# Patient Record
Sex: Female | Born: 1935 | Race: Black or African American | Hispanic: No | Marital: Married | Smoking: Former smoker
Health system: Southern US, Community
[De-identification: ages and names within clinical notes are randomized; demographics above are authoritative.]

## PROBLEM LIST (undated history)

## (undated) DIAGNOSIS — Z8719 Personal history of other diseases of the digestive system: Secondary | ICD-10-CM

## (undated) DIAGNOSIS — G4733 Obstructive sleep apnea (adult) (pediatric): Secondary | ICD-10-CM

## (undated) DIAGNOSIS — E1165 Type 2 diabetes mellitus with hyperglycemia: Secondary | ICD-10-CM

## (undated) DIAGNOSIS — I639 Cerebral infarction, unspecified: Secondary | ICD-10-CM

## (undated) DIAGNOSIS — K219 Gastro-esophageal reflux disease without esophagitis: Secondary | ICD-10-CM

## (undated) DIAGNOSIS — C801 Malignant (primary) neoplasm, unspecified: Secondary | ICD-10-CM

## (undated) DIAGNOSIS — K226 Gastro-esophageal laceration-hemorrhage syndrome: Secondary | ICD-10-CM

## (undated) DIAGNOSIS — I10 Essential (primary) hypertension: Secondary | ICD-10-CM

## (undated) DIAGNOSIS — E559 Vitamin D deficiency, unspecified: Secondary | ICD-10-CM

## (undated) DIAGNOSIS — Z9989 Dependence on other enabling machines and devices: Secondary | ICD-10-CM

## (undated) DIAGNOSIS — R400 Somnolence: Secondary | ICD-10-CM

## (undated) DIAGNOSIS — IMO0002 Reserved for concepts with insufficient information to code with codable children: Secondary | ICD-10-CM

## (undated) DIAGNOSIS — G4700 Insomnia, unspecified: Secondary | ICD-10-CM

## (undated) DIAGNOSIS — E669 Obesity, unspecified: Secondary | ICD-10-CM

## (undated) DIAGNOSIS — E785 Hyperlipidemia, unspecified: Secondary | ICD-10-CM

## (undated) DIAGNOSIS — E118 Type 2 diabetes mellitus with unspecified complications: Secondary | ICD-10-CM

## (undated) DIAGNOSIS — I739 Peripheral vascular disease, unspecified: Secondary | ICD-10-CM

## (undated) DIAGNOSIS — Z9289 Personal history of other medical treatment: Secondary | ICD-10-CM

## (undated) DIAGNOSIS — I2510 Atherosclerotic heart disease of native coronary artery without angina pectoris: Principal | ICD-10-CM

## (undated) DIAGNOSIS — K5900 Constipation, unspecified: Secondary | ICD-10-CM

## (undated) DIAGNOSIS — G8929 Other chronic pain: Secondary | ICD-10-CM

## (undated) DIAGNOSIS — I509 Heart failure, unspecified: Secondary | ICD-10-CM

## (undated) DIAGNOSIS — E66811 Obesity, class 1: Secondary | ICD-10-CM

## (undated) HISTORY — DX: Malignant (primary) neoplasm, unspecified: C80.1

## (undated) HISTORY — DX: Type 2 diabetes mellitus with unspecified complications: E11.8

## (undated) HISTORY — DX: Peripheral vascular disease, unspecified: I73.9

## (undated) HISTORY — DX: Reserved for concepts with insufficient information to code with codable children: IMO0002

## (undated) HISTORY — DX: Hyperlipidemia, unspecified: E78.5

## (undated) HISTORY — DX: Type 2 diabetes mellitus with hyperglycemia: E11.65

## (undated) HISTORY — DX: Cerebral infarction, unspecified: I63.9

## (undated) HISTORY — PX: HYSTEROSCOPY: SHX211

## (undated) HISTORY — DX: Heart failure, unspecified: I50.9

## (undated) HISTORY — DX: Atherosclerotic heart disease of native coronary artery without angina pectoris: I25.10

## (undated) HISTORY — DX: Personal history of other medical treatment: Z92.89

## (undated) HISTORY — DX: Dependence on other enabling machines and devices: Z99.89

## (undated) HISTORY — DX: Somnolence: R40.0

## (undated) HISTORY — PX: CHOLECYSTECTOMY: SHX55

## (undated) HISTORY — DX: Obstructive sleep apnea (adult) (pediatric): G47.33

## (undated) HISTORY — DX: Personal history of other diseases of the digestive system: Z87.19

## (undated) HISTORY — DX: Obesity, unspecified: E66.9

## (undated) HISTORY — DX: Gastro-esophageal laceration-hemorrhage syndrome: K22.6

## (undated) HISTORY — DX: Essential (primary) hypertension: I10

## (undated) HISTORY — PX: EYE SURGERY: SHX253

## (undated) HISTORY — DX: Obesity, class 1: E66.811

---

## 1997-05-20 ENCOUNTER — Other Ambulatory Visit: Admission: RE | Admit: 1997-05-20 | Discharge: 1997-05-20 | Payer: Self-pay | Admitting: Obstetrics and Gynecology

## 1998-05-27 ENCOUNTER — Other Ambulatory Visit: Admission: RE | Admit: 1998-05-27 | Discharge: 1998-05-27 | Payer: Self-pay | Admitting: Obstetrics and Gynecology

## 1999-03-10 ENCOUNTER — Other Ambulatory Visit: Admission: RE | Admit: 1999-03-10 | Discharge: 1999-03-10 | Payer: Self-pay | Admitting: Obstetrics and Gynecology

## 2000-03-15 ENCOUNTER — Other Ambulatory Visit: Admission: RE | Admit: 2000-03-15 | Discharge: 2000-03-15 | Payer: Self-pay | Admitting: Obstetrics and Gynecology

## 2001-04-10 ENCOUNTER — Other Ambulatory Visit: Admission: RE | Admit: 2001-04-10 | Discharge: 2001-04-10 | Payer: Self-pay | Admitting: Obstetrics & Gynecology

## 2002-04-15 ENCOUNTER — Other Ambulatory Visit: Admission: RE | Admit: 2002-04-15 | Discharge: 2002-04-15 | Payer: Self-pay | Admitting: Obstetrics and Gynecology

## 2003-08-12 ENCOUNTER — Other Ambulatory Visit: Admission: RE | Admit: 2003-08-12 | Discharge: 2003-08-12 | Payer: Self-pay | Admitting: Obstetrics and Gynecology

## 2006-02-06 DIAGNOSIS — I2510 Atherosclerotic heart disease of native coronary artery without angina pectoris: Secondary | ICD-10-CM

## 2006-02-06 HISTORY — DX: Atherosclerotic heart disease of native coronary artery without angina pectoris: I25.10

## 2006-11-13 ENCOUNTER — Ambulatory Visit: Payer: Self-pay | Admitting: Surgery

## 2006-12-18 ENCOUNTER — Ambulatory Visit: Payer: Self-pay | Admitting: Surgery

## 2006-12-27 ENCOUNTER — Ambulatory Visit: Payer: Self-pay | Admitting: Cardiothoracic Surgery

## 2006-12-27 ENCOUNTER — Encounter: Payer: Self-pay | Admitting: Surgery

## 2006-12-27 ENCOUNTER — Ambulatory Visit (HOSPITAL_COMMUNITY): Admission: RE | Admit: 2006-12-27 | Discharge: 2006-12-27 | Payer: Self-pay | Admitting: Surgery

## 2006-12-31 ENCOUNTER — Inpatient Hospital Stay (HOSPITAL_COMMUNITY): Admission: RE | Admit: 2006-12-31 | Discharge: 2007-01-05 | Payer: Self-pay | Admitting: Surgery

## 2006-12-31 ENCOUNTER — Ambulatory Visit: Payer: Self-pay | Admitting: Surgery

## 2006-12-31 HISTORY — PX: CORONARY ARTERY BYPASS GRAFT: SHX141

## 2007-01-25 ENCOUNTER — Ambulatory Visit: Payer: Self-pay | Admitting: Cardiothoracic Surgery

## 2007-01-25 ENCOUNTER — Encounter: Admission: RE | Admit: 2007-01-25 | Discharge: 2007-01-25 | Payer: Self-pay | Admitting: Cardiothoracic Surgery

## 2009-04-21 ENCOUNTER — Encounter: Admission: RE | Admit: 2009-04-21 | Discharge: 2009-04-21 | Payer: Self-pay | Admitting: Obstetrics and Gynecology

## 2009-05-30 IMAGING — CR DG CHEST 2V
2 series · 2 of 2 positions shown · non-contrast
Comparison: none

HISTORY: Status post CABG

[w chest pa]
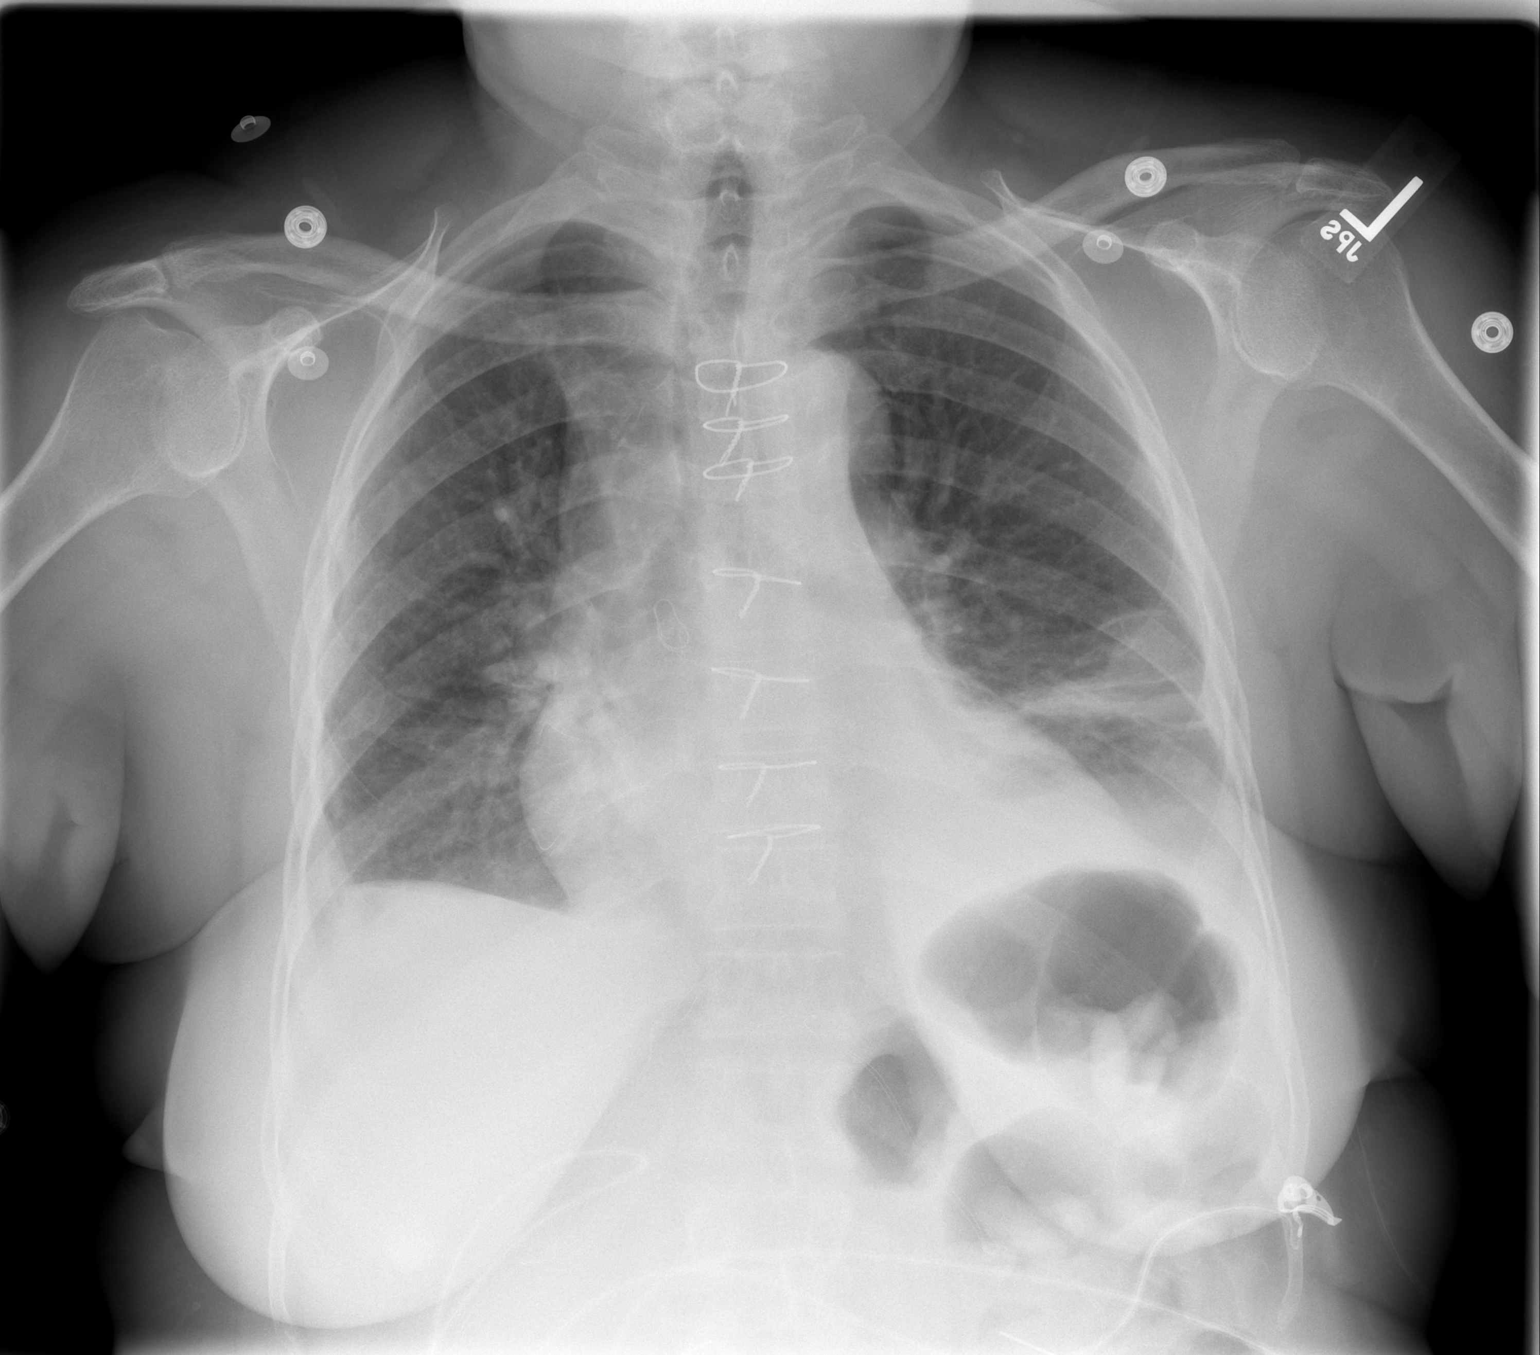

[w chest lat]
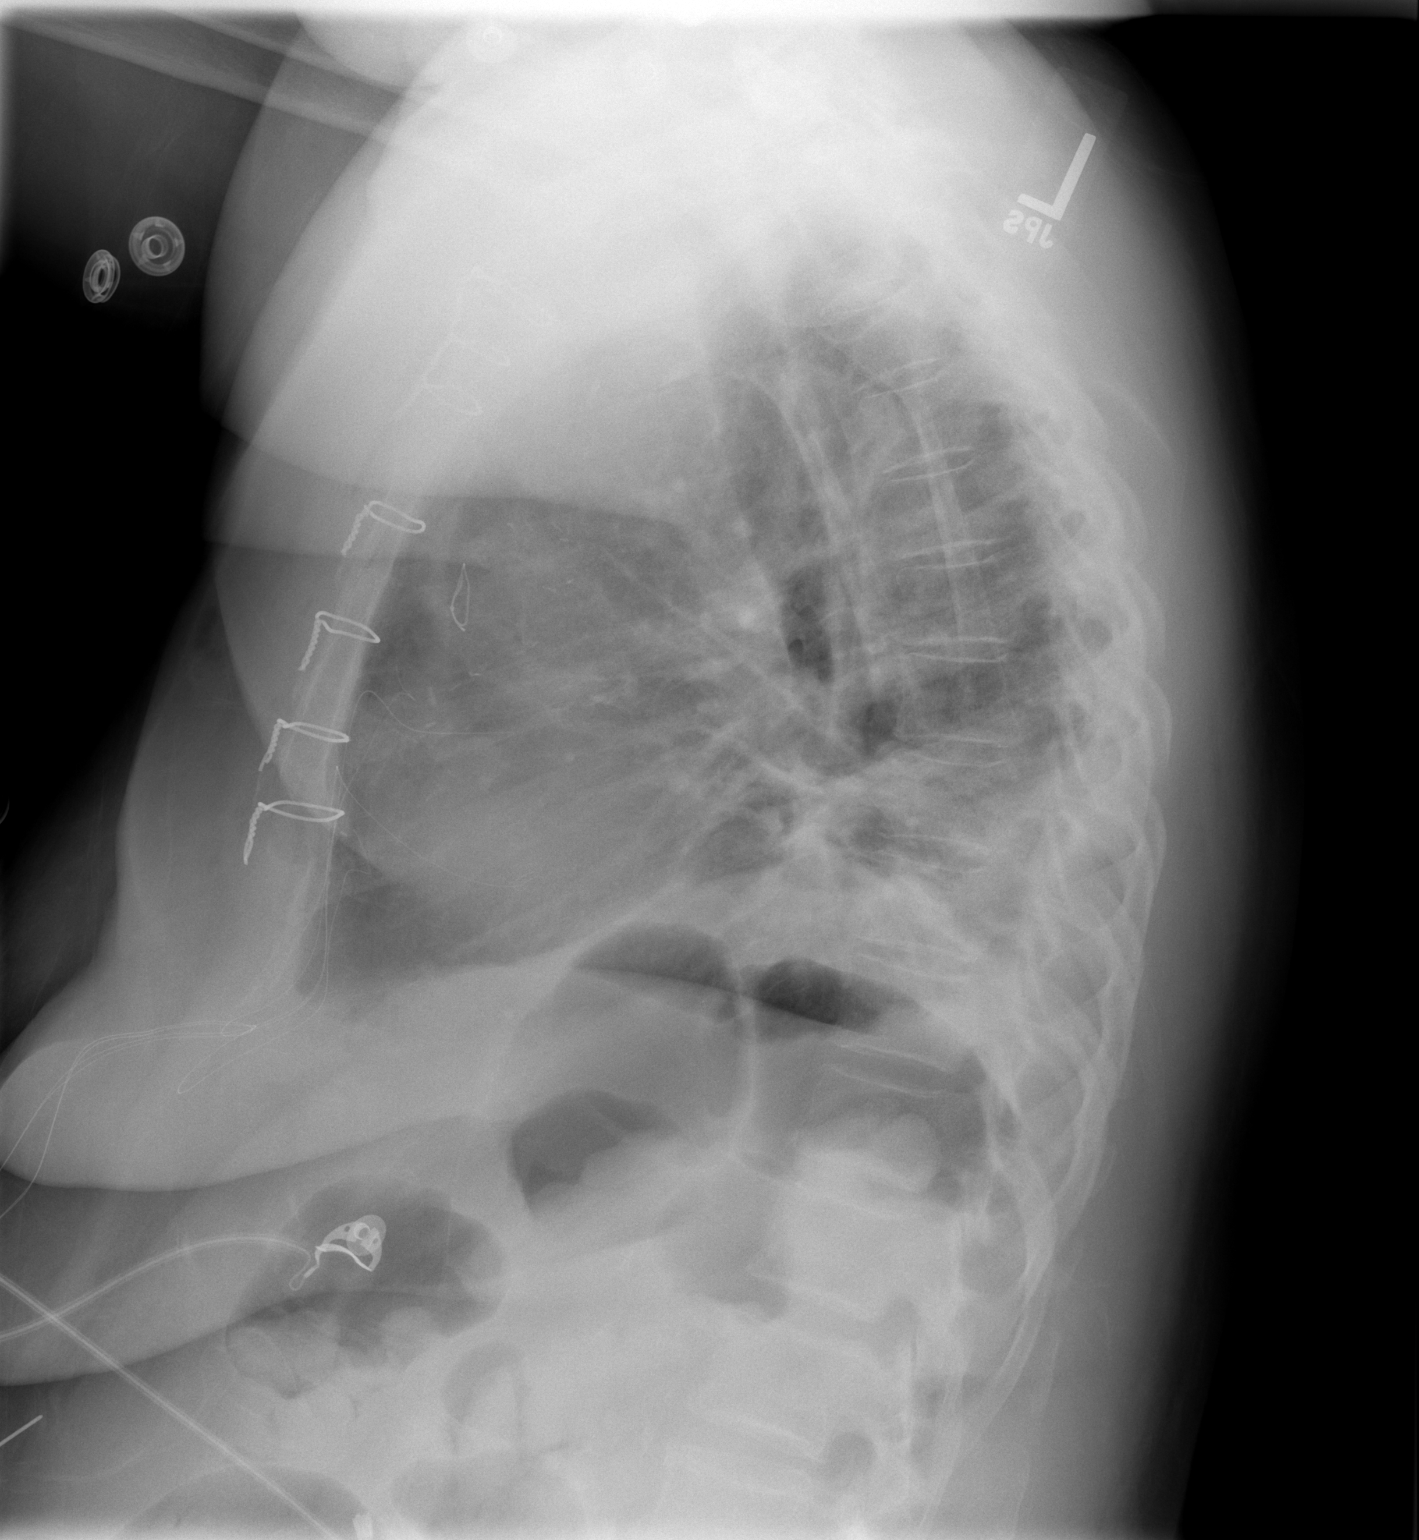

[2 of 2 positions shown; findings below may reference images not displayed]

CHEST 2 VIEWS:

Comparison 01/01/2007

Interval removal of mediastinal drain, left thoracostomy tube, and Swan-Ganz
catheter.
Cardiac enlargement status post CABG.
Slight pulmonary vascular congestion.
Small bibasilar effusions and left basilar atelectasis.
Tiny left apex pneumothorax.
Improved aeration right lower lobe.
IMPRESSION: Left basilar atelectasis and small pleural effusion with tiny right pleural
effusion.
Tiny left apex pneumothorax status post chest tube removal.
Cardiac enlargement with pulmonary vascular congestion.

## 2010-06-21 NOTE — Assessment & Plan Note (Signed)
OFFICE VISIT   [name] [name], [name] [name]  DOB:  1931-08-19                                        December 27, 2006  CHART #:  [id number]   The patient is scheduled for coronary artery bypass grafting by Dr.  Lavinia Sharps on Monday, November 24.  The patient came in today to the patient  area to have blood drawn and noted that she had had some coughing and  that had been started about 5 days ago on amoxicillin so she was asked  to come to the office to be evaluated preoperatively.  She notes that  since starting on amoxicillin she has had marked improvement in the  cough that she had last week and is now nonproductive cough.  She is not  short of breath.  Denies any known fever or chills at home.   PHYSICAL EXAMINATION:  GENERAL:  On exam, the patient is comfortable  without coughing during exam.  VITALS:  Blood pressure is 145/76, pulse is 82, respiratory rate is 18,  O2 sat is 96% on room air.  Temp is 97.6.  LUNGS:  Clear bilaterally without wheezing or rhonchi.  HEENT:  She has no nasal drainage.  She has no pedal edema.   Follow-up chest x-ray shows a normal chest x-ray without evidence of  effusions or infiltrates.  The patient's laboratory findings have a  slight elevation of a white of 13,000.  Hematocrit is 37.3, hemoglobin  12.3, potassium is 3.8, glucose is elevated at 173, creatinine is 0.9.  After examining the patient and her chest x-ray, it seems reasonable to  proceed with her planned bypass surgery next week.  She will contact us  immediately should she have any fever, chills.  She is on Pletal  which I have asked her to stop today.  This office note is dictated by  Dr. Tyrone Sage on [name] [name].   [name] Plane, MD  Electronically Signed   EG/MEDQ  D:  12/27/2006  T:  12/28/2006  Job:  [id number]   cc:   [name] [name], M.D.

## 2010-06-21 NOTE — Discharge Summary (Signed)
NAME:  [name] [name], FIX NO.:  [id number]   MEDICAL RECORD NO.:  [id number]          PATIENT TYPE:  INP   LOCATION:  2021                         FACILITY:  [facility]   PHYSICIAN:  Theda Belfast, PA DATE OF BIRTH:  1931-05-07   DATE OF ADMISSION:  12/31/2006  DATE OF DISCHARGE:                               DISCHARGE SUMMARY   FINAL DIAGNOSIS:  1. Left main severe three-vessel coronary artery disease.   IN-HOSPITAL DIAGNOSES:  1. Acute blood loss anemia postoperatively.  2. Volume overload postoperatively.  3. Urinary tract infection postoperatively.   SECONDARY DIAGNOSES:  1. Diabetes mellitus.  2. Hypertension.  3. Hyperlipidemia.  4. Status post cholecystectomy.  5. Gastroesophageal reflux disease.   IN-HOSPITAL OPERATIONS AND PROCEDURES:  1. Coronary artery bypass grafting times 4 using a left internal      mammary artery graft to left anterior descending coronary,      sequential saphenous vein graft to diagonal branch of left anterior      descending artery, obtuse marginal branch of the left circumflex,      and the posterior lateral branch of the right coronary artery.      Endoscopic vein harvesting from left leg done.   PATIENT'S HISTORY AND PHYSICAL AND HOSPITAL COURSE:  The patient is a 77-  year-old female with history of diabetes mellitus who was referred by  Dr. Allyson Sabal.  She had no prior cardiac history but was evaluated in July  2008 for lower extremity claudication symptoms.  Given her risk factors  for heart disease and some vague heart burn type symptoms she underwent  a presenting Myoview test which showed inferior lateral ischemia.  She  subsequently underwent cardiac catheterization on October 23, 2006  which showed 60% distal left main stenosis and three-vessel coronary  artery disease.  Following catheterization Dr. Laneta Simmers was consulted.  He  saw and evaluated the patient.  He discussed with the patient undergoing  coronary  artery bypass grafting.  Risks and benefits were discussed with  the patient.  The patient acknowledged her understanding and agreed to  proceed.  Surgery was scheduled for December 31, 2006.  For details of  the patient's past medical history and physical exam please see dictated  H&P.   The patient was taken to the Operating Room December 31, 2006 where she  underwent coronary artery bypass grafting times 4 using left internal  mammary artery to left anterior descending coronary artery, sequential  saphenous vein graft to diagonal branch of left anterior descending  artery, obtuse marginal branch of the left circumflex, posterior lateral  branch of the right coronary artery.  Endoscopic vein harvesting from  the left leg was done.  The patient tolerated this procedure and was  transferred to the Intensive Care Unit in stable condition.  Postoperatively the patient was noted to be hemodynamically stable.  She  was extubated the evening of surgery.  Post extubation the patient noted  to be alert and oriented times 4.  Neuro intact.  While on the Intensive  Care Unit the patient was able to be weaned from all drips.  Swan-Ganz  catheter DC in  normal fashion.  Vital signs noted to be stable.  Postoperative chest x-ray done and showed to be clear.  She had minimal  drainage from chest tubes and chest tube DC in normal fashion.  The  patient did have slight acute blood loss anemia postop day #1, was  asymptomatic and this was monitored.  The patient also had have slight  volume overload and was started on diuretics.  Her blood sugars were  monitored and was initially started on Lantus insulin postoperatively.  While in the Intensive Care Unit she was out of bed ambulating well with  Cardiac rehab.  She was felt to be stable for transfer out to 2000.  On  Telemetry Floor the patient's vital signs continued to be monitored.  She remained afebrile.  She did have slight hypertension and  Lopressor  was increased as well as restarted on an ACE inhibitor.  Blood pressure  stabilized after adjusting these medications.  The patient was able to  be weaned off oxygen sating greater than 90% on room air.  Daily weights  were continued to be monitored.  She continued to have slight volume  overload and remained on diuretics.  Her weight was back towards  baseline prior to discharge home.  She was 2 kg above her preoperative  weight currently.  The patient's blood sugars were continued to be  monitored and they were elevated while on 2000.  The patient was  restarted on her Janumet and metformin.  Blood sugars remained elevated  and Lantus insulin was increased.  Currently monitoring the patient's  blood sugars closely.  Postoperatively the patient did develop  leukocytosis with a white count increasing to 16.2.  Urine was sent for  urinalysis and culture.  Her urinalysis showed positive for small  leukocytes.  She was started on ciprofloxacin.  These need to be  monitored prior to discharge home.  The patient had no difficulties  urinating.  Her blood count was continued to be monitored while on 2000.  It remained stable.  The patient remained asymptomatic.  She did not  require any transfusions.  Last hemoglobin/hematocrit was postop day #3  at 8.6 and 25.3%.  Postoperatively the patient's pulmonary status  continued to improve.  She remained in normal sinus rhythm.  All  incisions were clean, dry and intact and healing well.  She continued to  ambulate well postoperatively with cardiac rehab.  She was tolerating  diet well.  No nausea, vomiting noted.   Labs postop day #3 November 27 showed a white count 16.2, hemoglobin of  8.6, hematocrit 25.3, platelet count 137.  Sodium of 141, potassium 4.1,  chloride of 107, bicarb of 27, BUN of 17, creatinine 0.99, glucose 246.   The patient is tentatively ready for discharge home in the a.m.,  January 05, 2007, postop day #5.    FOLLOW-UP APPOINTMENTS:  Follow-up appointment will be arranged with Dr.  Laneta Simmers for in 3 weeks.  Our office will contact the patient with this  information.  The patient will need to obtain PMI chest x-ray 30 minutes  prior to this appointment.  The patient will need to follow up with Dr.  Allyson Sabal in 2 weeks.  She will need to contact Dr. Hazle Coca office to make  these arrangements.   ACTIVITY:  Patient instructed no driving until released to do so, no  heavy lifting over 10 pounds.  She is told to ambulate 3-4 times per  day, progress as tolerated and to continue  her breathing exercises.   INCISIONAL CARE:  The patient is told to shower washing her incisions  using soap and water.  She is contact the office if she develops any  drainage or opening from any of her incision sites.   DIET:  The patient is again on diet to be low-fat, low-salt as well as  carbohydrate modified medium calorie diet.   DISCHARGE MEDICATIONS:  1. Vitamin B12 3-4 times per week.  2. Ciprofloxacin 500 mg b.i.d. times 2 days.  3. Aspirin 325 mg daily.  4. Lopressor 50 mg b.i.d.  5. Quinapril 5 mg b.i.d.  6. Zocor 40 mg at night.  7. Vitamin C 2-3 times per week.  8. Lasix 40 mg daily times 5 days.  9. Potassium chloride 20 mEq daily times 5 days.  10.Janumet 50/500 mg b.i.d.  11.Plavix 75 mg daily.  12.Metformin 500 mg b.i.d.  13.Humulin insulin 75/25 25 units b.i.d.  14.Pantoprazole 40 mg daily.  15.Oxycodone 5 mg 1-2 tablets q. 4-6 hours p.r.n. pain.      Theda Belfast, PA     KMD/MEDQ  D:  01/04/2007  T:  01/04/2007  Job:  [id number]   cc:   Evelene Croon, M.D.  Nanetta Batty, M.D.

## 2010-06-21 NOTE — Op Note (Signed)
NAME:  [name] [name], [name] [name] NO.:  [id number]   MEDICAL RECORD NO.:  [id number]          PATIENT TYPE:  INP   LOCATION:  2303                         FACILITY:  [facility]   PHYSICIAN:  Evelene Croon, M.D.     DATE OF BIRTH:  July 25, 1931   DATE OF PROCEDURE:  12/31/2006  DATE OF DISCHARGE:                               OPERATIVE REPORT   PREOPERATIVE DIAGNOSIS:  Left main and severe three-vessel coronary  disease.   POSTOPERATIVE DIAGNOSIS:  Left main and severe three-vessel coronary  disease.   OPERATIVE PROCEDURE:  Median sternotomy, extracorporeal circulation,  coronary bypass graft surgery x4 using a left internal mammary artery  graft to left anterior descending coronary, with a sequential saphenous  vein graft to the diagonal branch of the left anterior descending  artery, the obtuse marginal branch of the left circumflex, and the  posterolateral branch of the right coronary.  Endoscopic vein harvesting  from the left leg.   ATTENDING SURGEON:  Dr. Evelene Croon.   ASSISTANT:  She had, P.A.-C.   ANESTHESIA:  General endotracheal.   CLINICAL HISTORY:  This patient is a 77 year old woman with history of  diabetes who was referred by Dr. Nanetta Batty.  She had no prior  cardiac history but was evaluated in July 2008 for lower extremity  claudication symptoms.  Given her risk factors for heart disease and  some vague heartburn type symptoms, she underwent a presenting Myoview  test which showed inferolateral ischemia.  She subsequently underwent  cardiac catheterization on September 16 which showed 60% distal left  main stenosis and three-vessel disease.  The LAD had 30-40% ostial  stenosis.  There was a large diagonal branch that had an 80% ostial  stenosis.  The left circumflex gave off small- to medium-sized  intermediate vessels and then had a 80% ostial and proximal stenosis.  The obtuse marginal was occluded with faint filling by collaterals.  The  right  coronary artery has 70% proximal stenosis, 75% midvessel stenosis,  and 75% distal stenosis before the posterior descending and  posterolateral branches.  After review of the cardiac catheterization  and examination of the patient, when I saw her on November 13, 2006, I  recommended coronary artery bypass graft surgery.  She was in some  denial and wanted to go home and think about things before deciding  about surgery.  She returned to see me on December 18, 2006, and I made  a decision to proceed.  I discussed the operative procedure with her and  her family including alternatives, benefits, and risks including but not  limited to bleeding, blood transfusion, infection, stroke, myocardial  infarction, graft failure, and death.  They understood all of this and  agreed to proceed.   OPERATIVE PROCEDURE:  The patient was taken to the operating room and  placed on the table in the supine position.  After induction of general  endotracheal anesthesia, a Foley catheter was placed in bladder using  sterile technique.  Then, the chest, abdomen and both lower extremities  were prepped and draped in usual sterile manner.  The chest was entered  through a median sternotomy  incision, the pericardium opened in the  midline.  Examination of the heart showed good ventricular  contractility.  The ascending aorta had no palpable plaques in it.   Then, the left internal mammary artery was harvested from the chest wall  as a pedicle graft.  This was a medium caliber vessel with excellent  blood flow through it.  At the same time, we tried to identify the right  greater saphenous vein through a small incision adjacent to the medial  right knee.  The saphenous vein was a very tiny vein that was not  suitable for bypass grafting.  This vein appeared to be in the correct  location for the true saphenous vein.  I spent a considerable amount of  time looking around the general area for another suitable vein but  did  not find any, and therefore I think this was most likely the only true  saphenous vein there.  Since this was unsuitable, we went to the left  leg and identified the saphenous vein through the same incision medial  to the left knee.  This vein was of medium caliber and good quality at  the knee level.  This vein was then harvested endoscopically up and down  the leg.  The segment above the knee was medium-size vein of good  quality.  The segment below the knee quickly became very small and  unsuitable.  Therefore, we only had one segment of vein to use for this  surgery in addition to the left internal mammary graft.  I did not feel  that the right internal mammary graft should be used since it would not  be long enough to reach any of her vessels.   Then, the patient was heparinized, and when an adequate activated  clotting time was achieved, the distal ascending aorta was cannulated  using a 20-French aortic cannula for arterial inflow.  Venous outflow  was achieved using a two-stage venous cannula through the right atrial  appendage.  An antegrade cardioplegia and vent cannula was inserted in  the aortic root.   The patient placed on cardiopulmonary bypass and the distal coronaries  identified.  She had diffuse plaque.  The LAD was heavily diseased in  its proximal portion.  It was located in the mid vessel where it was  suitable for grafting.  The distal vessel was small and also had disease  in it.  The diagonal branch did not appear as large as it did on the  catheterization and was diffusely diseased but suitable for grafting.  The left circumflex basically had a small intermediate vessel that was  not graftable and then two obtuse marginal branches that were occluded  and filled up faintly by collaterals.  The more lateral of these two was  suitable for grafting, although it was diffusely diseased.  The right  coronary artery was diffusely diseased down to the takeoff of  the  posterior descending branch.  There was posterior descending branch and  at least three small posterolateral branches.  The most distal  posterolateral branch was small but borderline suitable for grafting.   Then, the aorta was crossclamped, and 500 mL of cold blood antegrade  cardioplegia was administered in the aortic root with quick arrest the  heart.  Systemic hypothermia to 20 degrees centigrade, and topical  hypothermic iced saline was used.  Temperature probe was placed in the  septum and insulating pad in the pericardium.   The first distal anastomosis was then  performed to the diagonal branch.  The internal diameter of this vessel was about 1.5 mm.  Conduit used was  a segment of greater saphenous vein and the anastomosis performed in a  sequential side-to-side manner using continuous 7-0 Prolene suture.  Flow was noted through the graft and was good.   The second distal anastomosis was performed to the obtuse marginal  branch.  The internal diameter was also about 1.5 mm.  Conduit used was  the same segment of greater saphenous vein.  The anastomosis was  performed in a sequential side-to-side manner using continuous 7-0  Prolene suture.  Flow was noted through the graft and was excellent.   The third distal anastomosis was performed of the posterolateral branch  of the right coronary artery.  The internal diameter was about 1.25 to  1.4 mm.  The conduit used was the same segment of greater saphenous  vein.  The anastomosis was performed in a sequential end-to-side manner  using continuous 8-0 Prolene suture.  Flow was again measured through  the graft and was good.  Then, another dose of cardioplegia was given  down vein graft and aortic root.   The fourth distal anastomosis was then performed to the midportion of  the left anterior descending coronary.  The internal diameter of this  vessel about 1.6 mm.  Conduit used was the left internal mammary graft,  and this  was brought through an opening in the left pericardium anterior  to the phrenic nerve.  It was anastomosed the LAD in an end-to-side  manner using continuous 8-0 Prolene suture.  The pedicle was sutured to  the epicardium with 6-0 Prolene sutures.  The patient was then rewarmed  to 37 degrees centigrade.  With a crossclamp in place, the single  proximal vein graft anastomosis was performed to the aortic root in an  end-to-side manner using a continuous 6-0 Prolene suture.  Then, the  clamp was removed from the mammary pedicle.  There was rapid warming of  the ventricular septum and return of spontaneous ventricular  fibrillation.  The crossclamp was removed with time of 81 minutes, and  the patient spontaneously converted to sinus rhythm.   The proximal and distal anastomoses appeared hemostatic and lay of the  grafts satisfactory.  Graft markers were placed around the proximal  anastomoses.  Two temporary right ventricular and right atrial pacing  wires were placed and brought through the skin.   When the patient had rewarmed to 37 degrees centigrade, she was weaned  from cardiopulmonary bypass on no inotropic agents.  Total bypass time  was 95 minutes.  Cardiac function appeared good with a cardiac output of  3.5 to 4 liters a minute.  Protamine was given and venous and aortic  cannulas were removed without difficulty.  Hemostasis was achieved.  Three chest tubes were placed with a tube in the posterior pericardium,  one in the left pleural space and one in the anterior mediastinum.  The  sternum was then closed with #6 stainless steel wires.  The fascia was  closed with continuous #1 Vicryl suture.  Subcutaneous tissue was closed  with continuous 2-0 Vicryl and skin with a 3-0 Vicryl subcuticular  closure.  The lower extremity vein harvest site was closed in layers in  a similar manner.  Sponge, needle and instrument counts were correct  according to the scrub nurse.  Dry sterile  dressings were applied over  the incisions, around the chest tubes which were hooked to  Pleur-Evac  suction.  The patient remained hemodynamically stable and was  transported to the surgical intensive care unit in guarded but stable  condition.      Evelene Croon, M.D.  Electronically Signed     BB/MEDQ  D:  12/31/2006  T:  12/31/2006  Job:  [id number]   cc:   Nanetta Batty, M.D.  Evelene Croon, M.D.

## 2010-06-21 NOTE — Assessment & Plan Note (Signed)
OFFICE VISIT   [name] [name], [name] [name]  DOB:  09/28/1931                                        January 25, 2007  CHART #:  [id number]   The patient is status post coronary artery bypass grafting x4 done by  Dr. Laneta Simmers on December 31, 2006.  The patient's postoperative course was  very much unremarkable.  She was discharged to home in stable condition.  She presents back today for her three-week postop office visit.  The  patient is without complaints.  She denies any incisional or chest pain,  shortness of breath, denies any vomiting.  She does state that she has  not been sleeping well at night and has been using the Oxycodone every  other day, which helps.  She denies any drainage or openings from any of  her incision sites.  The patient has been seen and evaluated by Dr.  Allyson Sabal this last Monday.  He did not change any of patient's medications.  She states she plans to start cardiac rehab at the beginning of next  year.   PHYSICAL EXAMINATION:  Vital signs:  Blood pressure of 146/81, pulse of  76, respirations of 18, and O2 SAT is 97% on room air.  Respiratory:  Clear to auscultation bilaterally.  Cardiac:  Regular rate and rhythm.  Abdomen:  Bowel sounds x4.  Soft and nontender.  Extremities:  Positive  edema noted, left lower extremity.  All extremities are warm to touch.  Incisions:  All incisions are clean, dry, and intact, and healing well.   STUDIES:  The patient had a PA and lateral chest x-ray done December  19th showing small bilateral pleural effusions, which are greatly  improved from last chest x-ray.   IMPRESSION AND PLAN:  The patient is status post coronary artery bypass  grafting x4.  She was seen and evaluated by Dr. Donata Clay.  The patient  is progressing well.  The plan is to place patient on a week's course of  Lasix 40 mg for her peripheral edema.  She was instructed to use Tylenol-  PM to assist with sleeping at night.  She is  to continue ambulating  three to four times per day as well as continuing on her incentive  spirometer.  The patient was released to drive and instructed in no  heavy lifting over 10 pounds for another month.  Plan will be to start  cardiac rehab beginning next year.  The patient is to continue all other  medications at this time.  We will release the patient from our office  and instructed her to continue followup with Dr. Allyson Sabal.  She is to  contact us with any questions or incisional problems.  The patient  acknowledged her understanding.   Kerin Perna, M.D.  Electronically Signed   KMD/MEDQ  D:  01/25/2007  T:  01/26/2007  Job:  [id number]   cc:   Kerin Perna, M.D.  Nanetta Batty, M.D.

## 2010-06-21 NOTE — H&P (Signed)
HISTORY AND PHYSICAL EXAMINATION   December 18, 2006   Re:  [name] [name], [name] [name]        DOB:  August 27, 1931   REASON FOR ADMISSION:  Sever 3-vessel coronary artery disease with  abnormal stress test.   HISTORY OF PRESENT ILLNESS:  This 77 year old woman with a history of  diabetes was referred by Dr. Nanetta Batty for consideration of  coronary artery bypass graft surgery.  I saw her in initial consultation  on November 13, 2006, at which time I recommended coronary artery bypass  graft surgery.  She was in somewhat of denial and had to go home and  think about it for awhile and returns today to discuss it further.  She  had no prior cardiac history but was evaluated in July, 2008 for lower  extremity claudication symptoms.  She underwent peripheral Dopplers,  which showed ABI 0.58 on the right and 0.62 on the left with evidence of  tibial disease.  She was started on Pletal.  Given her risk factors for  heart disease and some vague heartburn type symptoms, she underwent a  Persantine Myoview test, which showed inferolateral ischemia.  She  subsequently underwent cardiac catheterization on October 23, 2006,  which showed a 60% distal left main stenosis and 3-vessel disease.  The  LAD had 30-40% ostial stenosis.  There was a large diagonal branch had  80% ostial stenosis.  The left circumflex gave off a small to medium  size intermediate vessel and then had an 80% ostial and proximal  stenosis.  The obtuse marginal branch was occluded with faint filling by  collaterals.  The right coronary artery had 70% proximal, 75% mid and  75% distal stenosis before the posterior descending and several small  posterolateral branches.  Since I last saw her, she says that she has  been feeling about the same.  She still denies any chest pain or  pressure.  She has recently had a cold, still has some cough from this.   REVIEW OF SYSTEMS:  GENERAL:  She denies any fevers or  chills.  She has  had no recent weight changes.  She continues to have fatigue and says  that she falls asleep easily during the day.  EYES:  Negative.  ENT:  Negative.  ENDOCRINE:  She has a 20+ year history of diabetes.  She  denies hypothyroidism.  CARDIOVASCULAR:  She reports some heartburn type  symptoms but denies any chest pain or pressure.  She said these symptoms  usually occur after eating.  She has not been active.  She denies any  shortness of breath.  She has had no PND or orthopnea.  She denies  peripheral edema.  She denies palpitations.  RESPIRATORY:  She has had  upper respiratory symptoms for several days.  She reports a cough but  has not coughed while she has been in the office.  She denies any sputum  production.  GI:  She denies nausea, vomiting.  She denies melena and  bright red blood per rectum.  She has some constipation chronically and  reports reflux symptoms.  GU:  She denies dysuria and hematuria.  VASCULAR:  She denies lower extremity claudication and phlebitis.  NEUROLOGIC:  She denies any focal weakness and numbness.  She denies  dizziness and syncope.  She has never had a TIA or a stroke.  MUSCULOSKELETAL:  She denies arthralgias and myalgias.  HEMATOLOGICAL:  Negative.  PSYCHIATRIC:  Negative.   ALLERGIES:  None.  PAST MEDICAL HISTORY:  1. Diabetes.  2. Hypertension.  3. Hyperlipidemia.  4. She is status post cholecystectomy.  5. She has history of prior episode of hematemesis 1 year ago with a      negative upper endoscopy.   SOCIAL HISTORY:  She is married and has 4 children.  Three of them are  here with her today.  She is retired.  She is a remote smoker but quit  over 30 years ago and denies alcohol use.   FAMILY HISTORY:  Negative for cardiac disease.   MEDICATIONS:  1. Zocor 40 mg daily.  2. Metformin 500 mg b.i.d.  3. Quinapril 40 mg daily.  4. HCTZ 25 mg daily.  5. Janumet 50/500 1 b.i.d.  6. Aspirin b.i.d.  7. Pletal 500 mg  b.i.d.  8. Protonix 40 mg daily.  9. Insulin 75/35 q. a.m. and q. p.m.  10.Lopressor.  11.Multiple supplements.   PHYSICAL EXAMINATION:  VITAL SIGNS:  Blood pressure 151/84, pulse 99 and  regular, respiratory rate 18 and unlabored, oxygen saturation on room  air 98%.  GENERAL:  She is a mildly obese black female in no distress.  HEENT:  Normocephalic, atraumatic.  Pupils equal and reactive to light  and accommodation.  Extraocular muscles intact.  Throat is clear.  NECK:  Normal carotid pulses bilaterally.  There are no bruits.  There  is no adenopathy or thyromegaly.  CARDIOVASCULAR:  Regular rate and rhythm with normal S1, S2.  There is  no murmurs, rubs or gallops.  LUNGS:  Clear.  ABDOMINAL:  Active bowel sounds.  Abdomen is soft, mildly obese and  nontender.  No palpable masses or organomegaly.  EXTREMITIES:  No peripheral edema.  Pedal pulses are not palpable.  NEUROLOGIC:  Alert and oriented x3.  Motor and sensory exam is grossly  normal.  SKIN:  Warm and dry.   IMPRESSION:  This patient has a severe 3-vessel and left main coronary  disease with a positive stress test showing inferolateral ischemia.  She  denies any typical chest pain or pressure but has had fatigue and falls  asleep easily, which I suspect are secondary to her heart disease.  She  has a very sedentary lifestyle.  I feel that coronary artery bypass  graft surgery is the best treatment to prevent further ischemia and  infarction and to preserve her myocardium  I discussed the operative  procedure with her and her family including alternatives, benefits and  risk, including but not limited to bleeding, blood transfusion,  infection, stroke, myocardial infarction, organ failure and death.  Also  discussed the importance of maximum cardiac risk factor reduction.  They  understand all this and she would like to proceed with surgery.  We will  wait until her cold has resolved and plan to schedule her surgery  for  December 31, 2006.  She will contact our office prior to that if her  cold symptoms are not resolving over the next few days.   Evelene Croon, M.D.  Electronically Signed   BB/MEDQ  D:  12/18/2006  T:  12/19/2006  Job:  [id number]

## 2010-06-21 NOTE — Consult Note (Signed)
NEW PATIENT CONSULTATION   [name], [name] C  DOB:  Dec 09, 1931                                        November 13, 2006  CHART #:  [id number]   REASON FOR CONSULTATION:  Severe 3 vessel coronary artery disease with  abnormal stress test.   HISTORY OF PRESENT ILLNESS:  I was asked by Dr. Nanetta Batty to  evaluate this 77 year old woman for consideration of coronary artery  bypass graft surgery. She had no prior cardiac history but was evaluated  in July 2008 for lower extremity claudication symptoms. She underwent  lower extremity peripheral Doppler examination, which showed an ABI of  0.58 on the right and 0.62 on the left, with evidence of tibial disease.  She was started on Pletal. In addition, given her risk factors for heart  disease and some heartburn type of symptoms, she underwent a Persantine  Myoview test, which showed inferolateral ischemia. She subsequently  underwent cardiac catheterization on October 23, 2006, which showed  60% distal left main stenosis and 3 vessel disease. The LAD had 30% to  40% osteal stenosis. There is a large diagonal branch that had 80%  osteal stenosis. The left circumflex gave off a small intermediate  vessel and then had an 80% osteal and proximal stenosis in the left  circumflex coronary artery. The obtuse marginal branch was occluded with  faint filing by collaterals. The right coronary artery had 70% proximal,  75% mid, and 75% distal stenosis before the posterior descending and  several small posterolateral branches. Left ventricular function was  felt to be normal.   REVIEW OF SYSTEMS:  GENERAL:  She denies any fever or chills. She has  had no recent weight changes. She does report some mild fatigue over the  past few years. HEENT:  Eyes, negative. ENT:  Negative. ENDOCRINE:  She  does have a 20+ year history of diabetes. She denies hypothyroidism.  CARDIOVASCULAR:  She has had some heartburn type of chest  pain but  denies any clear anginal chest pain. These symptoms occasionally occur  with exertion but frequently after eating. She denies any shortness of  breath. She has had no PND or orthopnea. She denies peripheral edema.  She has had no palpitations. RESPIRATORY:  She denies cough and sputum  production. GASTROINTESTINAL:  She has had problems with reflux as well  as constipation. She denies any nausea and vomiting and denies  dysphagia. She has had no melena or bright red blood per rectum.  GENITOURINARY:  She denies dysuria and hematuria. VASCULAR:  She does  have lower extremity claudication. She has never had phlebitis.  NEUROLOGIC:  Denies any focal weakness or numbness. She denies dizziness  or syncope. She has never had a transient ischemic attack or a stroke.  MUSCULOSKELETAL:  She denies arthralgias and myalgias. PSYCHIATRIC:  Negative. HEMATOLOGIC:  Negative. ENT:  Negative.   PAST MEDICAL HISTORY:  Significant for diabetes, hypertension, and  hyperlipidemia. She is status post cholecystectomy in the past. She had  an episode of hematemesis about 1 year ago and had an upper endoscopy  with no definite cause found. She has had no recurrence.   SOCIAL HISTORY:  She is married and has 4 children. She is here with 1  daughter and her husband today. She is retired. She is a remote smoker  but quit  over 30 years ago. Denies alcohol abuse.   FAMILY HISTORY:  Negative for cardiac disease.   MEDICATIONS:  Zocor 40 mg daily, Metformin 500 mg b.i.d., Quinapril 40  mg daily, HCTZ 25 mg daily, Janumet 50/500 1 b.i.d., aspirin b.i.d.,  Pletal 50 mg b.i.d., Protonix 40 mg daily, insulin 75/35 a.m. and p.m.,  Lopressor unknown dose daily. She also takes cinnamon, garlic, and  bilberry.   PHYSICAL EXAMINATION:  VITAL SIGNS:  Blood pressure 144/70 and pulse is  72 and regular. Respiratory rate 16 and unlabored. Oxygen saturation on  room air is 98%.  GENERAL:  She is a mildly obese,  elderly black female in no distress.  HEENT:  Normocephalic and atraumatic. Pupils are equal, round, and  reactive to light and accommodation. Extraocular muscles intact. Throat  is clear.  NECK:  Normal carotid pulses bilaterally. There are no bruits. There is  no adenopathy or thyromegaly.  CARDIOVASCULAR:  Regular rate and rhythm. Normal S1 and S2. There is no  murmur, rub, or gallop.  LUNGS:  Clear.  ABDOMEN:  Active bowel sounds. Soft, mildly obese, and nontender. There  are no palpable masses or organomegaly.  EXTREMITIES:  No peripheral edema. Pedal pulses are absent.  NEUROLOGIC:  Alert and oriented times three. Motor and sensory  examinations are grossly normal.  SKIN:  Warm and dry.   LABORATORY DATA:  From October 22, 2006, showed a hemoglobin of 12.8,  hematocrit 39.3, platelet count of 200,000. Her coagulation profile was  normal. Electrolytes were normal with a BUN of 13, creatinine of 0.97,  and glucose of 125. Liver function profile was normal with an albumin of  4.0. Her lipid profile showed a total cholesterol of 160, triglyceride  91, HDL 43, and LDL 99. TSH was 1.016, which was within normal limits.   IMPRESSION:  [name] [name] has severe 3 vessel coronary artery disease  with an abnormal stress test. She said that she feels fairly well  overall and is very anxious over the prospect of having coronary artery  bypass surgery. I agree that coronary artery bypass surgery is the best  treatment to prevent further ischemia and will have a survival advantage  for her. I discussed the operative procedure with her and her family  including alternatives, benefits, and risks. She said that she would  like to go home and think about it for a while, before making any kind  of decision. She is still having a hard time accepting this diagnosis,  since she has been feeling relatively well, in her mind. She will  contact my office if and when she decides to proceed with  surgery.   Evelene Croon, M.D.  Electronically Signed   BB/MEDQ  D:  11/13/2006  T:  11/13/2006  Job:  [id number]   cc:   Nanetta Batty, M.D.  Lianne Bushy, M.D.

## 2010-09-19 ENCOUNTER — Encounter (INDEPENDENT_AMBULATORY_CARE_PROVIDER_SITE_OTHER): Payer: No Typology Code available for payment source | Admitting: Ophthalmology

## 2010-09-19 DIAGNOSIS — H43819 Vitreous degeneration, unspecified eye: Secondary | ICD-10-CM

## 2010-09-19 DIAGNOSIS — E11359 Type 2 diabetes mellitus with proliferative diabetic retinopathy without macular edema: Secondary | ICD-10-CM

## 2010-11-15 LAB — CBC
HCT: 26 — ABNORMAL LOW
Hemoglobin: 8.7 — ABNORMAL LOW
Hemoglobin: 8.7 — ABNORMAL LOW
MCHC: 33.4
MCHC: 33.5
MCHC: 34
MCHC: 34
MCV: 87.1
MCV: 87.4
MCV: 87.6
Platelets: 137 — ABNORMAL LOW
Platelets: 139 — ABNORMAL LOW
Platelets: 143 — ABNORMAL LOW
Platelets: 302
RBC: 2.93 — ABNORMAL LOW
RBC: 2.97 — ABNORMAL LOW
RBC: 2.97 — ABNORMAL LOW
RBC: 3.02 — ABNORMAL LOW
RBC: 3.06 — ABNORMAL LOW
RDW: 15.1
WBC: 13.4 — ABNORMAL HIGH
WBC: 15.9 — ABNORMAL HIGH
WBC: 16.2 — ABNORMAL HIGH
WBC: 16.6 — ABNORMAL HIGH

## 2010-11-15 LAB — URINE CULTURE: Colony Count: 5000

## 2010-11-15 LAB — TYPE AND SCREEN
ABO/RH(D): O POS
Antibody Screen: NEGATIVE

## 2010-11-15 LAB — BLOOD GAS, ARTERIAL
Acid-Base Excess: 2
Drawn by: 181601
FIO2: 0.21
O2 Saturation: 96.7
Patient temperature: 98.6
TCO2: 26.9
pCO2 arterial: 38
pH, Arterial: 7.446 — ABNORMAL HIGH

## 2010-11-15 LAB — BASIC METABOLIC PANEL
BUN: 14
BUN: 17
CO2: 25
CO2: 26
CO2: 27
Calcium: 8.4
Calcium: 9
Chloride: 103
Chloride: 110
Creatinine, Ser: 0.97
Creatinine, Ser: 0.98
Creatinine, Ser: 0.99
Creatinine, Ser: 1.03
GFR calc Af Amer: >60
GFR calc Af Amer: >60
GFR calc Af Amer: >60
GFR calc non Af Amer: 53 — ABNORMAL LOW
Glucose, Bld: 226 — ABNORMAL HIGH
Sodium: 136
Sodium: 141

## 2010-11-15 LAB — URINALYSIS, ROUTINE W REFLEX MICROSCOPIC
Glucose, UA: >1000 — AB
Hgb urine dipstick: NEGATIVE
Ketones, ur: NEGATIVE
Nitrite: NEGATIVE
Nitrite: NEGATIVE
Protein, ur: NEGATIVE
Protein, ur: NEGATIVE
Urobilinogen, UA: 1
pH: 5.5
pH: 8

## 2010-11-15 LAB — POCT I-STAT 4, (NA,K, GLUC, HGB,HCT)
Glucose, Bld: 137 — ABNORMAL HIGH
Glucose, Bld: 142 — ABNORMAL HIGH
Glucose, Bld: 146 — ABNORMAL HIGH
HCT: 23 — ABNORMAL LOW
HCT: 24 — ABNORMAL LOW
HCT: 26 — ABNORMAL LOW
Hemoglobin: 10.2 — ABNORMAL LOW
Hemoglobin: 11.6 — ABNORMAL LOW
Hemoglobin: 11.9 — ABNORMAL LOW
Hemoglobin: 7.1 — CL
Hemoglobin: 7.8 — CL
Operator id: 3342
Potassium: 3.3 — ABNORMAL LOW
Potassium: 3.6
Potassium: 3.6
Potassium: 4.2
Sodium: 137
Sodium: 137
Sodium: 140

## 2010-11-15 LAB — POCT I-STAT 3, ART BLOOD GAS (G3+)
Acid-Base Excess: 1
Acid-Base Excess: 3 — ABNORMAL HIGH
Acid-base deficit: 1
Bicarbonate: 24.4 — ABNORMAL HIGH
Bicarbonate: 26.7 — ABNORMAL HIGH
O2 Saturation: 100
Operator id: 113031
Operator id: 291371
Operator id: 3342
Operator id: 3342
Patient temperature: 34.7
TCO2: 28
pH, Arterial: 7.421 — ABNORMAL HIGH
pH, Arterial: 7.441 — ABNORMAL HIGH
pO2, Arterial: 120 — ABNORMAL HIGH
pO2, Arterial: 271 — ABNORMAL HIGH

## 2010-11-15 LAB — COMPREHENSIVE METABOLIC PANEL
AST: 19
Alkaline Phosphatase: 90
CO2: 25
Chloride: 102
GFR calc Af Amer: >60
GFR calc non Af Amer: >60

## 2010-11-15 LAB — HEMOGLOBIN A1C: Hgb A1c MFr Bld: 8.5 — ABNORMAL HIGH

## 2010-11-15 LAB — PROTIME-INR
INR: 1.4
Prothrombin Time: 17.4 — ABNORMAL HIGH

## 2010-11-15 LAB — APTT: aPTT: 22 — ABNORMAL LOW

## 2010-11-15 LAB — I-STAT EC8
Acid-base deficit: 1
Bicarbonate: 25.4 — ABNORMAL HIGH
HCT: 26 — ABNORMAL LOW
Operator id: 291371
TCO2: 27
pCO2 arterial: 49.4 — ABNORMAL HIGH

## 2010-11-15 LAB — POCT I-STAT GLUCOSE
Glucose, Bld: 97
Operator id: 3342

## 2010-11-15 LAB — URINE MICROSCOPIC-ADD ON

## 2010-11-15 LAB — ABO/RH: ABO/RH(D): O POS

## 2010-11-15 LAB — CREATININE, SERUM: GFR calc Af Amer: >60

## 2011-03-22 ENCOUNTER — Ambulatory Visit (INDEPENDENT_AMBULATORY_CARE_PROVIDER_SITE_OTHER): Payer: No Typology Code available for payment source | Admitting: Ophthalmology

## 2011-04-06 ENCOUNTER — Ambulatory Visit (INDEPENDENT_AMBULATORY_CARE_PROVIDER_SITE_OTHER): Payer: No Typology Code available for payment source | Admitting: Ophthalmology

## 2011-04-26 ENCOUNTER — Other Ambulatory Visit: Payer: Self-pay | Admitting: Obstetrics and Gynecology

## 2011-04-26 DIAGNOSIS — R928 Other abnormal and inconclusive findings on diagnostic imaging of breast: Secondary | ICD-10-CM

## 2011-05-02 ENCOUNTER — Ambulatory Visit
Admission: RE | Admit: 2011-05-02 | Discharge: 2011-05-02 | Disposition: A | Discharge status: Home / Self Care | Payer: Medicare Other | Source: Ambulatory Visit | Attending: Obstetrics and Gynecology | Admitting: Obstetrics and Gynecology

## 2011-05-02 DIAGNOSIS — R928 Other abnormal and inconclusive findings on diagnostic imaging of breast: Secondary | ICD-10-CM

## 2011-07-10 ENCOUNTER — Ambulatory Visit (INDEPENDENT_AMBULATORY_CARE_PROVIDER_SITE_OTHER): Payer: Medicare Other | Admitting: Ophthalmology

## 2011-07-10 DIAGNOSIS — H2700 Aphakia, unspecified eye: Secondary | ICD-10-CM

## 2011-07-10 DIAGNOSIS — E1139 Type 2 diabetes mellitus with other diabetic ophthalmic complication: Secondary | ICD-10-CM

## 2011-07-10 DIAGNOSIS — H35039 Hypertensive retinopathy, unspecified eye: Secondary | ICD-10-CM

## 2011-07-10 DIAGNOSIS — H43819 Vitreous degeneration, unspecified eye: Secondary | ICD-10-CM

## 2011-07-10 DIAGNOSIS — E11359 Type 2 diabetes mellitus with proliferative diabetic retinopathy without macular edema: Secondary | ICD-10-CM

## 2011-07-10 DIAGNOSIS — I10 Essential (primary) hypertension: Secondary | ICD-10-CM

## 2012-01-10 ENCOUNTER — Ambulatory Visit (INDEPENDENT_AMBULATORY_CARE_PROVIDER_SITE_OTHER): Payer: Medicare Other | Admitting: Ophthalmology

## 2012-01-10 DIAGNOSIS — H35039 Hypertensive retinopathy, unspecified eye: Secondary | ICD-10-CM

## 2012-01-10 DIAGNOSIS — E11359 Type 2 diabetes mellitus with proliferative diabetic retinopathy without macular edema: Secondary | ICD-10-CM

## 2012-01-10 DIAGNOSIS — E1165 Type 2 diabetes mellitus with hyperglycemia: Secondary | ICD-10-CM

## 2012-01-10 DIAGNOSIS — H43819 Vitreous degeneration, unspecified eye: Secondary | ICD-10-CM

## 2012-01-10 DIAGNOSIS — I10 Essential (primary) hypertension: Secondary | ICD-10-CM

## 2012-01-10 DIAGNOSIS — H35379 Puckering of macula, unspecified eye: Secondary | ICD-10-CM

## 2012-02-07 DIAGNOSIS — G4733 Obstructive sleep apnea (adult) (pediatric): Secondary | ICD-10-CM

## 2012-02-07 DIAGNOSIS — I639 Cerebral infarction, unspecified: Secondary | ICD-10-CM

## 2012-02-07 HISTORY — DX: Cerebral infarction, unspecified: I63.9

## 2012-02-07 HISTORY — DX: Obstructive sleep apnea (adult) (pediatric): G47.33

## 2012-05-07 DIAGNOSIS — I739 Peripheral vascular disease, unspecified: Secondary | ICD-10-CM | POA: Insufficient documentation

## 2012-05-07 HISTORY — DX: Peripheral vascular disease, unspecified: I73.9

## 2012-06-04 ENCOUNTER — Other Ambulatory Visit (HOSPITAL_COMMUNITY): Payer: Self-pay | Admitting: Cardiovascular Disease

## 2012-06-04 DIAGNOSIS — I25810 Atherosclerosis of coronary artery bypass graft(s) without angina pectoris: Secondary | ICD-10-CM

## 2012-06-06 DIAGNOSIS — Z9289 Personal history of other medical treatment: Secondary | ICD-10-CM

## 2012-06-06 HISTORY — DX: Personal history of other medical treatment: Z92.89

## 2012-06-11 ENCOUNTER — Ambulatory Visit (HOSPITAL_COMMUNITY)
Admission: RE | Admit: 2012-06-11 | Discharge: 2012-06-11 | Disposition: A | Discharge status: Home / Self Care | Payer: Medicare Other | Source: Ambulatory Visit | Attending: Cardiovascular Disease | Admitting: Cardiovascular Disease

## 2012-06-11 DIAGNOSIS — R42 Dizziness and giddiness: Secondary | ICD-10-CM | POA: Insufficient documentation

## 2012-06-11 DIAGNOSIS — R0602 Shortness of breath: Secondary | ICD-10-CM | POA: Insufficient documentation

## 2012-06-11 DIAGNOSIS — R5383 Other fatigue: Secondary | ICD-10-CM | POA: Insufficient documentation

## 2012-06-11 DIAGNOSIS — R5381 Other malaise: Secondary | ICD-10-CM | POA: Insufficient documentation

## 2012-06-11 DIAGNOSIS — I10 Essential (primary) hypertension: Secondary | ICD-10-CM | POA: Insufficient documentation

## 2012-06-11 DIAGNOSIS — E119 Type 2 diabetes mellitus without complications: Secondary | ICD-10-CM | POA: Insufficient documentation

## 2012-06-11 DIAGNOSIS — E669 Obesity, unspecified: Secondary | ICD-10-CM | POA: Insufficient documentation

## 2012-06-11 DIAGNOSIS — I25810 Atherosclerosis of coronary artery bypass graft(s) without angina pectoris: Secondary | ICD-10-CM

## 2012-06-11 MED ORDER — TECHNETIUM TC 99M SESTAMIBI GENERIC - CARDIOLITE
30.1000 | Freq: Once | INTRAVENOUS | Status: AC | PRN
  Administered 2012-06-11: 30.1 via INTRAVENOUS

## 2012-06-11 MED ORDER — AMINOPHYLLINE 25 MG/ML IV SOLN
75.0000 mg | Freq: Once | INTRAVENOUS | Status: AC
  Administered 2012-06-11: 75 mg via INTRAVENOUS

## 2012-06-11 MED ORDER — REGADENOSON 0.4 MG/5ML IV SOLN
0.4000 mg | Freq: Once | INTRAVENOUS | Status: AC
  Administered 2012-06-11: 0.4 mg via INTRAVENOUS

## 2012-06-11 MED ORDER — TECHNETIUM TC 99M SESTAMIBI GENERIC - CARDIOLITE
10.4000 | Freq: Once | INTRAVENOUS | Status: AC | PRN
  Administered 2012-06-11: 10 via INTRAVENOUS

## 2012-06-11 NOTE — Procedures (Addendum)
[facility] [address] [place] [address] [place] [address] [phone]  Cardiology Nuclear Med Study  TRANISHA [name] is a 79 y.o. female     MRN : [id number]     DOB: 04-Sep-1931  Procedure Date: 06/11/2012  Nuclear Med Background Indication for Stress Test:  Graft Patency History:  cad;cabg x5--2008 Cardiac Risk Factors: History of Smoking, Hypertension, IDDM Type 2, Lipids and Obesity  Symptoms:  Dizziness, Fatigue, Light-Headedness and SOB   Nuclear Pre-Procedure Caffeine/Decaff Intake:  1:00am NPO After: 11 am   IV Site: R Forearm  IV 0.9% NS with Angio Cath:  22g  Chest Size (in):  n/a IV Started by: Emmit Pomfret, RN  Height: 5\' 4"  (1.626 m)  Cup Size: c  BMI:  Body mass index is 34.31 kg/(m^2). Weight:  200 lb (90.719 kg)   Tech Comments:  n/a    Nuclear Med Study 1 or 2 day study: 1 day  Stress Test Type:  Lexiscan  Order Authorizing Provider:  Obie Dredge   Resting Radionuclide: Technetium 87m Sestamibi  Resting Radionuclide Dose: 10.4 mCi   Stress Radionuclide:  Technetium 17m Sestamibi  Stress Radionuclide Dose: 1415 mCi           Stress Protocol Rest HR: 64 Stress HR: 75  Rest BP: 118/50 Stress BP: 103/44  Exercise Time (min): n/a METS: n/a          Dose of Adenosine (mg):  n/a Dose of Lexiscan: 0.4 mg  Dose of Atropine (mg): n/a Dose of Dobutamine: n/a mcg/kg/min (at max HR)  Stress Test Technologist: Ernestene Mention, CCT Nuclear Technologist: [name], CNMT   Rest Procedure:  Myocardial perfusion imaging was performed at rest 45 minutes following the intravenous administration of Technetium 75m Sestamibi. Stress Procedure:  The patient received IV Lexiscan 0.4 mg over 15-seconds.  Technetium 52m Sestamibi injected at 30-seconds.  Due to patient's shortness of breath, she was given IV Aminophylline 75 mg. Symptom was resolved during recovery. There were no significant changes with Lexiscan.   Quantitative spect images were obtained after a 45 minute delay.  Transient Ischemic Dilatation (Normal <1.22):  1.27 Lung/Heart Ratio (Normal <0.45):  0.31 QGS EDV:  64 ml QGS ESV:  26 ml LV Ejection Fraction: 59%  Signed by [name], CNMT  Rest ECG: NSR, Lateral Infarct Age Undedermined, Non-specific ST-T changes; Poor Precordial R wave progression  Stress ECG: No significant change from baseline ECG  QPS Raw Data Images:  Acquisition technically good; normal left ventricular size. Stress Images:  There is decreased uptake in the lateral wall. There is a moderate to large sized medium intensity perfusion defect from the basal to apical lateral (both antero- and infer-lateral) with moderate reversibility Rest Images:  There is decreased uptake in the lateral wall. Subtraction (SDS):  There is a fixed defect that is most consistent with a previous infarction. There is evidence of reversibility, consistent with ischemia.   Impression Exercise Capacity:  Lexiscan with no exercise. BP Response:  Normal blood pressure response. Clinical Symptoms:  There is dyspnea. ECG Impression:  No significant ECG changes with Lexiscan. LV Wall Motion:  Normal function with mild hypokinesis in the lateral wall.  Comparison with Prior Nuclear Study: No significant change from previous study  Overall Impression:  High risk stress nuclear study with lateral infarction with moderate peri-infarct ischemia. The results are very similar to the prior examination, also called High Risk.  These symptoms are consistent with known Circumflex  occlusion with collaterals noted angiographically.   [name], MD  06/11/2012 4:46 PM

## 2012-07-03 DIAGNOSIS — G4733 Obstructive sleep apnea (adult) (pediatric): Secondary | ICD-10-CM

## 2012-07-10 ENCOUNTER — Ambulatory Visit (INDEPENDENT_AMBULATORY_CARE_PROVIDER_SITE_OTHER): Payer: Medicare Other | Admitting: Ophthalmology

## 2012-07-12 ENCOUNTER — Encounter: Payer: Medicare Other | Admitting: Cardiology

## 2012-07-12 ENCOUNTER — Encounter: Payer: Self-pay | Admitting: Cardiology

## 2012-07-12 NOTE — Progress Notes (Signed)
This encounter was created in error - please disregard.

## 2012-07-15 ENCOUNTER — Ambulatory Visit (INDEPENDENT_AMBULATORY_CARE_PROVIDER_SITE_OTHER): Payer: Medicare Other | Admitting: Ophthalmology

## 2012-07-15 DIAGNOSIS — H35039 Hypertensive retinopathy, unspecified eye: Secondary | ICD-10-CM

## 2012-07-15 DIAGNOSIS — I10 Essential (primary) hypertension: Secondary | ICD-10-CM

## 2012-07-15 DIAGNOSIS — E1139 Type 2 diabetes mellitus with other diabetic ophthalmic complication: Secondary | ICD-10-CM

## 2012-07-15 DIAGNOSIS — H43819 Vitreous degeneration, unspecified eye: Secondary | ICD-10-CM

## 2012-07-15 DIAGNOSIS — E11359 Type 2 diabetes mellitus with proliferative diabetic retinopathy without macular edema: Secondary | ICD-10-CM

## 2012-07-18 ENCOUNTER — Telehealth: Payer: Self-pay | Admitting: Cardiovascular Disease

## 2012-07-18 NOTE — Telephone Encounter (Signed)
Fax received.  Records faxed to [facility] on 6.9.14.  Call to pt and informed she should be contact by [facility] soon.  Number given.  Pt verbalized understanding and agreed w/ plan.

## 2012-07-18 NOTE — Telephone Encounter (Signed)
Call to [facility].  Informed study has been read and was faxed on the 9th.  No record in pt's chart.  Will refax now.

## 2012-07-18 NOTE — Telephone Encounter (Signed)
Mrs. [name] [name] is calling because she had a sleep test and have not heard anything about the results .[name] Please call    Thanks

## 2012-08-19 ENCOUNTER — Telehealth: Payer: Self-pay | Admitting: Cardiovascular Disease

## 2012-08-19 NOTE — Telephone Encounter (Signed)
Had sleep test about 2 months or more-still have not gotten the results! Very concerned!

## 2012-08-21 ENCOUNTER — Telehealth: Payer: Self-pay | Admitting: Cardiovascular Disease

## 2012-08-21 NOTE — Telephone Encounter (Signed)
Wanting to know the results of a test she  had done two months ago .[name] Please call   Thanks

## 2012-08-23 NOTE — Telephone Encounter (Signed)
Lmom.also sent message to Agenda at [facility].  It appears that patient's information was sent to [facility] for CPAP equipment on 07/15/12

## 2012-08-26 NOTE — Telephone Encounter (Signed)
Still waiting to get sleep study results-been over 2 months ago since she had it.she feels she need a C-Pac machine.[name]

## 2012-08-26 NOTE — Telephone Encounter (Signed)
I spoke with Melissa from [facility] and they do not have an order for the DME equipment.  I called the [employer] and they said that they faxed over the order on 6/9.  I asked that they fax over the info again.  I left message for patient with plan.

## 2012-09-17 ENCOUNTER — Emergency Department: Payer: Self-pay | Admitting: Emergency Medicine

## 2012-09-17 LAB — CBC
MCHC: 33.7 g/dL (ref 32.0–36.0)
Platelet: 229 10*3/uL (ref 150–440)
RBC: 4.44 10*6/uL (ref 3.80–5.20)
RDW: 15.4 % — ABNORMAL HIGH (ref 11.5–14.5)

## 2012-09-17 LAB — URINALYSIS, COMPLETE
Glucose,UR: NEGATIVE mg/dL (ref 0–75)
Ph: 5 (ref 4.5–8.0)
WBC UR: 2 /HPF (ref 0–5)

## 2012-09-17 LAB — COMPREHENSIVE METABOLIC PANEL
Anion Gap: 8 (ref 7–16)
BUN: 24 mg/dL — ABNORMAL HIGH (ref 7–18)
Bilirubin,Total: 0.4 mg/dL (ref 0.2–1.0)
Creatinine: 1.76 mg/dL — ABNORMAL HIGH (ref 0.60–1.30)
Glucose: 138 mg/dL — ABNORMAL HIGH (ref 65–99)
Osmolality: 280 (ref 275–301)
Potassium: 3.4 mmol/L — ABNORMAL LOW (ref 3.5–5.1)
SGOT(AST): 24 U/L (ref 15–37)
SGPT (ALT): 23 U/L (ref 12–78)

## 2012-09-26 ENCOUNTER — Encounter: Payer: Self-pay | Admitting: Cardiovascular Disease

## 2012-09-27 ENCOUNTER — Telehealth: Payer: Self-pay | Admitting: Cardiovascular Disease

## 2012-09-27 NOTE — Telephone Encounter (Signed)
Pt was calling regarding her sleep test that she had and wanted to know what the next steps are. She was supposed to get a machine and she said that no one has called her to tell her what to do. She said that she has been calling and no one has called about this.

## 2012-09-27 NOTE — Telephone Encounter (Signed)
I spoke with patient.  According to the telephone note, [facility] was supposed to refax her sleep study to [facility] on 7/21.  Pt says she has not heard anything from anyone.  I sent a message to [facility] to see what was going on.  Pt verbalized understanding.  She will call back if she hasn't heard anything from [facility] next Monday.

## 2012-09-27 NOTE — Telephone Encounter (Signed)
[name] [name] has been working on this.  Message forwarded.

## 2012-11-13 ENCOUNTER — Ambulatory Visit: Payer: Self-pay | Admitting: Family Medicine

## 2012-11-29 ENCOUNTER — Ambulatory Visit: Payer: Self-pay | Admitting: Family Medicine

## 2012-12-03 ENCOUNTER — Emergency Department: Payer: Self-pay | Admitting: Internal Medicine

## 2012-12-03 LAB — COMPREHENSIVE METABOLIC PANEL
Albumin: 3 g/dL — ABNORMAL LOW (ref 3.4–5.0)
Alkaline Phosphatase: 113 U/L (ref 50–136)
Bilirubin,Total: 0.1 mg/dL — ABNORMAL LOW (ref 0.2–1.0)
Calcium, Total: 9.3 mg/dL (ref 8.5–10.1)
Chloride: 101 mmol/L (ref 98–107)
Co2: 29 mmol/L (ref 21–32)
Creatinine: 2.18 mg/dL — ABNORMAL HIGH (ref 0.60–1.30)
EGFR (Non-African Amer.): 21 — ABNORMAL LOW
Glucose: 156 mg/dL — ABNORMAL HIGH (ref 65–99)
Osmolality: 289 (ref 275–301)
SGPT (ALT): 16 U/L (ref 12–78)

## 2012-12-03 LAB — CBC
HCT: 33.2 % — ABNORMAL LOW (ref 35.0–47.0)
MCV: 88 fL (ref 80–100)
Platelet: 258 10*3/uL (ref 150–440)
WBC: 15.4 10*3/uL — ABNORMAL HIGH (ref 3.6–11.0)

## 2012-12-11 ENCOUNTER — Observation Stay: Payer: Self-pay | Admitting: Internal Medicine

## 2012-12-11 LAB — URINALYSIS, COMPLETE
Bacteria: NONE SEEN
Glucose,UR: NEGATIVE mg/dL (ref 0–75)
Nitrite: NEGATIVE
Ph: 5 (ref 4.5–8.0)
Protein: 30
RBC,UR: 1 /HPF (ref 0–5)
Specific Gravity: 1.018 (ref 1.003–1.030)

## 2012-12-11 LAB — COMPREHENSIVE METABOLIC PANEL
Alkaline Phosphatase: 131 U/L (ref 50–136)
BUN: 24 mg/dL — ABNORMAL HIGH (ref 7–18)
Calcium, Total: 9.7 mg/dL (ref 8.5–10.1)
Chloride: 110 mmol/L — ABNORMAL HIGH (ref 98–107)
Glucose: 100 mg/dL — ABNORMAL HIGH (ref 65–99)
Osmolality: 285 (ref 275–301)
Potassium: 4.2 mmol/L (ref 3.5–5.1)
Sodium: 141 mmol/L (ref 136–145)

## 2012-12-11 LAB — CBC
HCT: 36.5 % (ref 35.0–47.0)
HGB: 12.3 g/dL (ref 12.0–16.0)
MCH: 29.3 pg (ref 26.0–34.0)
MCV: 87 fL (ref 80–100)
Platelet: 318 10*3/uL (ref 150–440)
RBC: 4.19 10*6/uL (ref 3.80–5.20)
RDW: 15.6 % — ABNORMAL HIGH (ref 11.5–14.5)
WBC: 16.2 10*3/uL — ABNORMAL HIGH (ref 3.6–11.0)

## 2012-12-11 LAB — TROPONIN I: Troponin-I: <0.02 ng/mL

## 2012-12-11 LAB — CK TOTAL AND CKMB (NOT AT ARMC): CK-MB: 1.8 ng/mL (ref 0.5–3.6)

## 2012-12-12 DIAGNOSIS — I517 Cardiomegaly: Secondary | ICD-10-CM

## 2012-12-12 LAB — CBC WITH DIFFERENTIAL/PLATELET
Basophil #: 0 10*3/uL (ref 0.0–0.1)
Eosinophil #: 0.1 10*3/uL (ref 0.0–0.7)
HCT: 28.3 % — ABNORMAL LOW (ref 35.0–47.0)
HGB: 9.4 g/dL — ABNORMAL LOW (ref 12.0–16.0)
Lymphocyte #: 3.2 10*3/uL (ref 1.0–3.6)
Lymphocyte %: 26.3 %
Monocyte #: 0.8 x10 3/mm (ref 0.2–0.9)
Neutrophil #: 8.1 10*3/uL — ABNORMAL HIGH (ref 1.4–6.5)
Neutrophil %: 65.7 %
Platelet: 268 10*3/uL (ref 150–440)

## 2012-12-12 LAB — LIPID PANEL
Cholesterol: 102 mg/dL (ref 0–200)
Ldl Cholesterol, Calc: 57 mg/dL (ref 0–100)
VLDL Cholesterol, Calc: 13 mg/dL (ref 5–40)

## 2012-12-12 LAB — BASIC METABOLIC PANEL
Anion Gap: 3 — ABNORMAL LOW (ref 7–16)
Creatinine: 1.31 mg/dL — ABNORMAL HIGH (ref 0.60–1.30)
EGFR (African American): 45 — ABNORMAL LOW
EGFR (Non-African Amer.): 39 — ABNORMAL LOW
Osmolality: 292 (ref 275–301)
Potassium: 3.7 mmol/L (ref 3.5–5.1)

## 2012-12-20 ENCOUNTER — Ambulatory Visit (INDEPENDENT_AMBULATORY_CARE_PROVIDER_SITE_OTHER): Payer: Medicare Other | Admitting: Cardiology

## 2012-12-20 ENCOUNTER — Encounter: Payer: Self-pay | Admitting: Cardiology

## 2012-12-20 VITALS — BP 120/60 | HR 66 | Ht 64.0 in | Wt 206.0 lb

## 2012-12-20 DIAGNOSIS — E1165 Type 2 diabetes mellitus with hyperglycemia: Secondary | ICD-10-CM

## 2012-12-20 DIAGNOSIS — R609 Edema, unspecified: Secondary | ICD-10-CM

## 2012-12-20 DIAGNOSIS — I739 Peripheral vascular disease, unspecified: Secondary | ICD-10-CM

## 2012-12-20 DIAGNOSIS — R600 Localized edema: Secondary | ICD-10-CM | POA: Insufficient documentation

## 2012-12-20 DIAGNOSIS — I10 Essential (primary) hypertension: Secondary | ICD-10-CM

## 2012-12-20 DIAGNOSIS — I639 Cerebral infarction, unspecified: Secondary | ICD-10-CM

## 2012-12-20 DIAGNOSIS — E785 Hyperlipidemia, unspecified: Secondary | ICD-10-CM

## 2012-12-20 DIAGNOSIS — G4733 Obstructive sleep apnea (adult) (pediatric): Secondary | ICD-10-CM

## 2012-12-20 DIAGNOSIS — IMO0002 Reserved for concepts with insufficient information to code with codable children: Secondary | ICD-10-CM

## 2012-12-20 DIAGNOSIS — I2510 Atherosclerotic heart disease of native coronary artery without angina pectoris: Secondary | ICD-10-CM

## 2012-12-20 DIAGNOSIS — I6350 Cerebral infarction due to unspecified occlusion or stenosis of unspecified cerebral artery: Secondary | ICD-10-CM

## 2012-12-20 NOTE — Assessment & Plan Note (Signed)
Last nuclear stress test was in May of 2014 which was stable.  No EKG changes today and no complaints of chest pain.

## 2012-12-20 NOTE — Assessment & Plan Note (Signed)
Controlled today 

## 2012-12-20 NOTE — Progress Notes (Signed)
12/20/2012   PCP: Park Pope, MD   Chief Complaint  Patient presents with  . Follow-up    6 month visit     Primary Cardiologist: Dr. Allyson Sabal  HPI  79 year old obese married [place] American female is here today with her husband and one of her daughters. She has a history of coronary artery disease with bypass grafting x5 years ago with LIMA to her LAD, sequential vein graft to the tight no branching obtuse marginal branch as well as vein to the PDA. She has hypertension hyperlipidemia and insulin-dependent diabetes mellitus.  She was originally seen for claudication and with that workup a stress test was done which was positive which led to cardiac cath which led to the bypass grafting.  Most recent Dopplers right ABI of 0.81 and left ABI of 0.98. Myoview stress test in May of 2014 revealed similar findings to prior study with lateral scar with moderate peri-infarction ischemia. She has no chest pain no shortness of breath  She was treated at [facility] for diabetic complications but during the hospitalization she felt she had a stroke as well as her physicians caring for her. She is following up with Dr. Clelia Croft a neurologist.    She did go through a sleep study for her obstructive sleep apnea and now has received her CPAP machine but she is having difficulty with it she is to see Dr. Tresa Endo next week for further management.  Her last lipids were in January 2014 waiting records from his recent hospitalization for further evaluation.  No Known Allergies  Current Outpatient Prescriptions  Medication Sig Dispense Refill  . aspirin 325 MG tablet Take 325 mg by mouth daily.      . carvedilol (COREG) 25 MG tablet Take 1 tablet by mouth 2 (two) times daily.      . clopidogrel (PLAVIX) 75 MG tablet Take 1 tablet by mouth daily.      . furosemide (LASIX) 20 MG tablet Take 2 tablets by mouth daily.       [name] lisinopril-hydrochlorothiazide (PRINZIDE,ZESTORETIC) 20-25 MG per  tablet Take 1 tablet by mouth daily.      [name] NOVOLOG MIX 70/30 (70-30) 100 UNIT/ML injection Take 68 units in the morning and 40 units at bedtime.      . ranitidine (ZANTAC) 150 MG tablet Take 150 mg by mouth 2 (two) times daily.      [name] amLODipine (NORVASC) 5 MG tablet Take 1 tablet by mouth daily.       No current facility-administered medications for this visit.    Past Medical History  Diagnosis Date  . CAD (coronary artery disease) 2008    + nuc study led to cath and CABG  . HTN (hypertension)   . Hyperlipidemia LDL goal < 70   . CVA (cerebral infarction) 2014  . Diabetes mellitus type 2 with complications, uncontrolled     CAD, CVA  . H/O cardiovascular stress test 06/2012    Stable with lateral scar and moderate. Infarction ischemia  . OSA on CPAP 2014     patient is just now beginning CPAP  . Daytime somnolence      hoping CPAP will be able to help this  . Obesity (BMI 30.0-34.9)   . H/O gastritis   . Mallory-Weiss tear     History of  . Claudication in peripheral vascular disease 05/2012     ABI right 0.81; left 0.98     Past Surgical  History  Procedure Laterality Date  . Coronary artery bypass graft  12/31/2006    CABG x4, LIMA-LAD, sequential VG-diagonal branch of LAD, OM of left circumflex & PLA branch of RCA  . Cholecystectomy      UJW:JXBJYNW:GN colds or fevers,  weight is up from 303 to 306 pounds Skin:no rashes or ulcers HEENT:no blurred vision, no congestion CV:see HPI PUL:see HPI GI:no diarrhea constipation or melena, no indigestion GU:no hematuria, no dysuria MS:no joint pain, no claudication Neuro:no syncope, no lightheadedness Endo:+ diabetes with elevated glucose, no thyroid disease  PHYSICAL EXAM BP 120/60  Pulse 66  Ht 5\' 4"  (1.626 m)  Wt 206 lb (93.441 kg)  BMI 35.34 kg/m2 General:Pleasant affect, NAD,  Though sleepy during conversation Skin:Warm and dry, brisk capillary refill HEENT:normocephalic, sclera clear, mucus membranes  moist Neck:supple, no JVD, no bruits  Heart:S1S2 RRR without murmur, gallup, rub or click Lungs:clear without rales, rhonchi, or wheezes FAO:ZHYQM, soft, non tender, + BS, do not palpate liver spleen or masses Ext:+ lower ext edema both legs are tight to touch with at least one to 2 pitting edema., 2+ pedal pulses, 2+ radial pulses Neuro:alert and oriented, MAE, follows commands, + facial symmetry  EKG: Sinus rhythm with left anterior fascicular block but no acute changes from previous tracing  ASSESSMENT AND PLAN CAD (coronary artery disease) Last nuclear stress test was in May of 2014 which was stable.  No EKG changes today and no complaints of chest pain.  Claudication in peripheral vascular disease No complaints of claudication today.  CVA (cerebral infarction) Was treated at [facility] for hypoglycemia/hyperglycemia but during that timeframe most likely had a CVA she'll follow up with the neurologist Dr. Clelia Croft  HTN (hypertension) Controlled today  OSA on CPAP Sleepy here today on exam. She is having trouble with the CPAP and is to see Dr. Tresa Endo in sleep clinic next week.  Briefly discussed importance of following through with this as that may help her somnolence during the day.  Hyperlipidemia LDL goal < 70 Last lipids I have LDL 68. She may have had labs at [facility] we will wait for those records  Diabetes mellitus type 2 with complications, uncontrolled Patient is diabetic poorly controlled frequently her glucose is greater than 200 that she does admit to checking it after she eats. She does have complications with peripheral vascular disease coronary artery disease and history of stroke.  Edema of both legs Bilateral lower extremity edema I increased her Lasix to 60 mg for 3 days in a row and then will decrease back to 40 mg daily and asked her weigh daily as well, if her weight increases she is to call us

## 2012-12-20 NOTE — Patient Instructions (Signed)
Increase your Lasix (furosemide) to 3 tabs every morning for 3 days then back to 2 daily.  Continue to watch salt in your diet.   Weigh daily Call [phone] if weight climbs more than 3 pounds in a day or 5 pounds in a week. No salt to very little salt in your diet.  No more than 2000 mg in a day. Call if increased shortness of breath or increased swelling.  Your blood pressure is good.    Keep your appt. With Dr. Tresa Endo for sleep clinic on the 18th, you could benefit from the cpap machine.  Follow up with Dr. Allyson Sabal in 6 months.

## 2012-12-20 NOTE — Assessment & Plan Note (Signed)
Patient is diabetic poorly controlled frequently her glucose is greater than 200 that she does admit to checking it after she eats. She does have complications with peripheral vascular disease coronary artery disease and history of stroke.

## 2012-12-20 NOTE — Assessment & Plan Note (Signed)
Bilateral lower extremity edema I increased her Lasix to 60 mg for 3 days in a row and then will decrease back to 40 mg daily and asked her weigh daily as well, if her weight increases she is to call us

## 2012-12-20 NOTE — Assessment & Plan Note (Signed)
Last lipids I have LDL 68. She may have had labs at [facility] we will wait for those records

## 2012-12-20 NOTE — Assessment & Plan Note (Signed)
Sleepy here today on exam. She is having trouble with the CPAP and is to see Dr. Tresa Endo in sleep clinic next week.  Briefly discussed importance of following through with this as that may help her somnolence during the day.

## 2012-12-20 NOTE — Assessment & Plan Note (Signed)
No complaints of claudication today.

## 2012-12-20 NOTE — Assessment & Plan Note (Signed)
Was treated at [facility] for hypoglycemia/hyperglycemia but during that timeframe most likely had a CVA she'll follow up with the neurologist Dr. Clelia Croft

## 2012-12-24 ENCOUNTER — Ambulatory Visit: Payer: Medicare Other | Admitting: Cardiovascular Disease

## 2013-01-13 ENCOUNTER — Ambulatory Visit: Payer: Self-pay | Admitting: Vascular Surgery

## 2013-01-13 LAB — BASIC METABOLIC PANEL
BUN: 49 mg/dL — ABNORMAL HIGH (ref 7–18)
Calcium, Total: 9.6 mg/dL (ref 8.5–10.1)
Chloride: 103 mmol/L (ref 98–107)
Co2: 28 mmol/L (ref 21–32)
EGFR (Non-African Amer.): 26 — ABNORMAL LOW
Osmolality: 286 (ref 275–301)
Sodium: 137 mmol/L (ref 136–145)

## 2013-01-15 ENCOUNTER — Ambulatory Visit (INDEPENDENT_AMBULATORY_CARE_PROVIDER_SITE_OTHER): Payer: Medicare Other | Admitting: Ophthalmology

## 2013-03-19 ENCOUNTER — Emergency Department: Payer: Self-pay | Admitting: Emergency Medicine

## 2013-05-07 ENCOUNTER — Telehealth: Payer: Self-pay | Admitting: Cardiovascular Disease

## 2013-05-07 NOTE — Telephone Encounter (Signed)
Is calling because they are needing [name] [name] next appointment faxed to the dmv on our letterhead with Dr.Berry name on it . According to the [facility] is asking for her name , Driver license # abd date of appt faxed to them at [phone] or [phone]  Thanks

## 2013-05-14 NOTE — Telephone Encounter (Signed)
I called and spoke with patient.  She is a little confused about what type of paper she needs.  I cannot clearly understand what she needs. I have asked the patient to have her daughter call me with more information.

## 2013-05-15 ENCOUNTER — Emergency Department: Payer: Self-pay | Admitting: Emergency Medicine

## 2013-05-15 ENCOUNTER — Telehealth: Payer: Self-pay | Admitting: Cardiovascular Disease

## 2013-05-15 LAB — CBC WITH DIFFERENTIAL/PLATELET
Basophil #: 0.1 10*3/uL (ref 0.0–0.1)
Basophil %: 0.5 %
EOS PCT: 0.7 %
Eosinophil #: 0.1 10*3/uL (ref 0.0–0.7)
HCT: 39.4 % (ref 35.0–47.0)
HGB: 12.4 g/dL (ref 12.0–16.0)
LYMPHS ABS: 3.3 10*3/uL (ref 1.0–3.6)
LYMPHS PCT: 17.6 %
MCH: 27.9 pg (ref 26.0–34.0)
MCHC: 31.5 g/dL — AB (ref 32.0–36.0)
MCV: 89 fL (ref 80–100)
MONOS PCT: 3.9 %
Monocyte #: 0.7 x10 3/mm (ref 0.2–0.9)
Neutrophil #: 14.3 10*3/uL — ABNORMAL HIGH (ref 1.4–6.5)
Neutrophil %: 77.3 %
PLATELETS: 190 10*3/uL (ref 150–440)
RBC: 4.45 10*6/uL (ref 3.80–5.20)
RDW: 16.1 % — ABNORMAL HIGH (ref 11.5–14.5)
WBC: 18.6 10*3/uL — ABNORMAL HIGH (ref 3.6–11.0)

## 2013-05-15 LAB — COMPREHENSIVE METABOLIC PANEL
ALK PHOS: 134 U/L — AB
ALT: 24 U/L (ref 12–78)
Albumin: 3.6 g/dL (ref 3.4–5.0)
Anion Gap: 5 — ABNORMAL LOW (ref 7–16)
BILIRUBIN TOTAL: 0.3 mg/dL (ref 0.2–1.0)
BUN: 36 mg/dL — ABNORMAL HIGH (ref 7–18)
Calcium, Total: 9.1 mg/dL (ref 8.5–10.1)
Chloride: 104 mmol/L (ref 98–107)
Co2: 28 mmol/L (ref 21–32)
Creatinine: 1.64 mg/dL — ABNORMAL HIGH (ref 0.60–1.30)
EGFR (Non-African Amer.): 29 — ABNORMAL LOW
GFR CALC AF AMER: 34 — AB
Glucose: 189 mg/dL — ABNORMAL HIGH (ref 65–99)
OSMOLALITY: 287 (ref 275–301)
Potassium: 4.1 mmol/L (ref 3.5–5.1)
SGOT(AST): 28 U/L (ref 15–37)
Sodium: 137 mmol/L (ref 136–145)
Total Protein: 7.8 g/dL (ref 6.4–8.2)

## 2013-05-15 NOTE — Telephone Encounter (Signed)
Calling to speak to you about extension of her mother driver license .[name] [name] (daughter) states that there is some paperwork that needs to be completed. Please call.. [phone]    Thanks

## 2013-05-15 NOTE — Telephone Encounter (Signed)
I spoke with patient and explained that she needs to contact her primary doctor.  [name] [name] repeated the fact that her primary doctor said that she couldn't drive.  I reiterated that fact that her primary doctor needs to fill out the papers. She verbalized understanding and will call her primary doctor. It is futile to get an extension on the papers until appt with Dr Gwenlyn Found when Dr Gwenlyn Found is not going to fill out the papers and say that she can drive.

## 2013-05-15 NOTE — Telephone Encounter (Signed)
I spoke with daughter [name] [name]).  Ms Soffner's license has not expired yet, but she has a piece of paper that needs to be filled out by a MD asking questions about her health.  Mom is on 3rd extension from [facility] for getting this paper filled out.  Primary MD, Dr Luan Pulling, recently told patient that it was not safe for her to drive.  Patient wants [place] to send in a letter to [facility] asking for her extension to be extended until her next appt with Dr Gwenlyn Found.  That letter needs to include her name, license, date of appt, and Dr's name on letterhead.  I explained to [name] [name] that Dr Gwenlyn Found would not be the one to determine her drivng abilities except from a cardiac standpoint.  She needs to follow up with her primary doc for those recommendations (which he said no driving).  I will contact patient to inform her.

## 2013-05-15 NOTE — Telephone Encounter (Signed)
See telephone note about conversation

## 2013-06-18 ENCOUNTER — Ambulatory Visit (INDEPENDENT_AMBULATORY_CARE_PROVIDER_SITE_OTHER): Payer: Commercial Managed Care - HMO | Admitting: Cardiovascular Disease

## 2013-06-18 ENCOUNTER — Encounter: Payer: Self-pay | Admitting: Cardiovascular Disease

## 2013-06-18 VITALS — BP 108/52 | HR 60 | Ht 63.0 in | Wt 191.0 lb

## 2013-06-18 DIAGNOSIS — E785 Hyperlipidemia, unspecified: Secondary | ICD-10-CM

## 2013-06-18 DIAGNOSIS — I2510 Atherosclerotic heart disease of native coronary artery without angina pectoris: Secondary | ICD-10-CM

## 2013-06-18 DIAGNOSIS — I739 Peripheral vascular disease, unspecified: Secondary | ICD-10-CM

## 2013-06-18 DIAGNOSIS — I10 Essential (primary) hypertension: Secondary | ICD-10-CM

## 2013-06-18 NOTE — Assessment & Plan Note (Signed)
On statin drug . We will recheck a lipid liver profile

## 2013-06-18 NOTE — Assessment & Plan Note (Signed)
The patient had an endovascular procedure performed by Dr. Lucky Cowboy in [place] approximately 6 months ago for what sounds like critical limb ischemia

## 2013-06-18 NOTE — Assessment & Plan Note (Signed)
Controlled on current medications 

## 2013-06-18 NOTE — Patient Instructions (Signed)
We request that you follow-up in: 6 months with Laura Ingold NP and in 1 year with Dr Berry  You will receive a reminder letter in the mail two months in advance. If you don't receive a letter, please call our office to schedule the follow-up appointment.   

## 2013-06-18 NOTE — Progress Notes (Signed)
06/18/2013 [name] [name]   [birth year]  [id number]  Primary Physician Coralie Common, MD Primary Cardiologist: Lorretta Harp MD [name]   HPI:  The patient is a very pleasant, this 80 year old, moderately overweight, married [place] American female, mother of 69, grandmother to 4 grandchildren accompanied by one of her daughters and husband today. I saw her 5 months ago. She has a history of CAD status post coronary artery bypass grafting x5 by Dr. Gilford Raid approximately 5 years ago with a LIMA to her LAD, sequential vein to a diagonal branch and obtuse marginal branch as well as a vein to the PDA. Her other problems include hypertension, hyperlipidemia and insulin-dependent diabetes. She had a Myoview performed in 2012 that showed lateral scar with mild superimposed ischemia though she denied any chest pain or shortness of breath. She also complained of claudication and Dopplers from our office showed a right ABI of 0.81 and a left of 0.98. She had a recent Myoview performed Jun 11, 2012, that showed similar findings to her prior study with lateral scar with moderate peri-infarct ischemia. Her major complaints are of daytime somnolence. Since I saw her one year ago she is seeing Cecilie Kicks registered nurse practitioner 12/20/12. She has had a stroke in the interim and is slowly rehabilitating. She also had an endovascular procedure on her left lower extremity for what sounds like critical limb ischemia by Dr. Lucky Cowboy in [place].     Current Outpatient Prescriptions  Medication Sig Dispense Refill  . aspirin 81 MG tablet Take 81 mg by mouth daily.      . carvedilol (COREG) 25 MG tablet Take 1 tablet by mouth 2 (two) times daily.      . clopidogrel (PLAVIX) 75 MG tablet Take 1 tablet by mouth daily.      . furosemide (LASIX) 20 MG tablet Take 2 tablets by mouth daily.       [name] lisinopril-hydrochlorothiazide (PRINZIDE,ZESTORETIC) 20-25 MG per tablet Take 1 tablet by mouth  daily.      [name] NOVOLOG MIX 70/30 (70-30) 100 UNIT/ML injection Take 68 units in the morning and 40 units at bedtime.      . pravastatin (PRAVACHOL) 40 MG tablet Take 40 mg by mouth daily.      . ranitidine (ZANTAC) 150 MG tablet Take 150 mg by mouth 2 (two) times daily.       No current facility-administered medications for this visit.    No Known Allergies  History   Social History  . Marital Status: Single    Spouse Name: N/A    Number of Children: N/A  . Years of Education: N/A   Occupational History  . Not on file.   Social History Main Topics  . Smoking status: Former Research scientist (life sciences)  . Smokeless tobacco: Not on file     Comment: 50 years ago  . Alcohol Use: No  . Drug Use: No  . Sexual Activity: Not on file   Other Topics Concern  . Not on file   Social History Narrative  . No narrative on file     Review of Systems: General: negative for chills, fever, night sweats or weight changes.  Cardiovascular: negative for chest pain, dyspnea on exertion, edema, orthopnea, palpitations, paroxysmal nocturnal dyspnea or shortness of breath Dermatological: negative for rash Respiratory: negative for cough or wheezing Urologic: negative for hematuria Abdominal: negative for nausea, vomiting, diarrhea, bright red blood per rectum, melena, or hematemesis Neurologic: negative for visual changes, syncope,  or dizziness All other systems reviewed and are otherwise negative except as noted above.    Blood pressure 108/52, pulse 60, height 5\' 3"  (1.6 m), weight 191 lb (86.637 kg).  General appearance: alert and no distress Neck: no adenopathy, no carotid bruit, no JVD, supple, symmetrical, trachea midline and thyroid not enlarged, symmetric, no tenderness/mass/nodules Lungs: clear to auscultation bilaterally Heart: regular rate and rhythm, S1, S2 normal, no murmur, click, rub or gallop Extremities: extremities normal, atraumatic, no cyanosis or edema  EKG normal sinus rhythm at 60 with  left anterior fascicular block and nonspecific ST and T-wave changes  ASSESSMENT AND PLAN:   CAD (coronary artery disease) CAD status post coronary artery bypass grafting by Dr. Pamalee Leyden approximately 6 years ago with a LIMA to her LAD, vein to the diagonal branch, obtuse marginal branch and PDA. She had a Myoview stress test performed 06/11/12 which was nonischemic. She denies chest pain or shortness of breath.  Claudication in peripheral vascular disease The patient had an endovascular procedure performed by Dr. Lucky Cowboy in [place] approximately 6 months ago for what sounds like critical limb ischemia  HTN (hypertension) Controlled on current medications  Hyperlipidemia LDL goal < 70 On statin drug . We will recheck a lipid liver profile      Lorretta Harp MD [facility], [facility] 06/18/2013 2:06 PM

## 2013-06-18 NOTE — Assessment & Plan Note (Signed)
CAD status post coronary artery bypass grafting by Dr. Pamalee Leyden approximately 6 years ago with a LIMA to her LAD, vein to the diagonal branch, obtuse marginal branch and PDA. She had a Myoview stress test performed 06/11/12 which was nonischemic. She denies chest pain or shortness of breath.

## 2013-08-05 ENCOUNTER — Ambulatory Visit: Payer: Self-pay | Admitting: Family Medicine

## 2013-08-06 ENCOUNTER — Ambulatory Visit: Payer: Self-pay | Admitting: Family Medicine

## 2013-09-06 ENCOUNTER — Ambulatory Visit: Payer: Self-pay | Admitting: Family Medicine

## 2013-09-23 ENCOUNTER — Telehealth: Payer: Self-pay | Admitting: Cardiovascular Disease

## 2013-09-23 NOTE — Telephone Encounter (Signed)
Close encounter 

## 2014-02-03 ENCOUNTER — Ambulatory Visit (INDEPENDENT_AMBULATORY_CARE_PROVIDER_SITE_OTHER): Payer: Commercial Managed Care - HMO | Admitting: Cardiology

## 2014-02-03 ENCOUNTER — Ambulatory Visit (HOSPITAL_COMMUNITY)
Admission: RE | Admit: 2014-02-03 | Discharge: 2014-02-03 | Disposition: A | Discharge status: Home / Self Care | Payer: Commercial Managed Care - HMO | Source: Ambulatory Visit | Attending: Cardiology | Admitting: Cardiology

## 2014-02-03 ENCOUNTER — Encounter: Payer: Self-pay | Admitting: Cardiology

## 2014-02-03 VITALS — BP 134/60 | HR 55 | Wt 199.2 lb

## 2014-02-03 DIAGNOSIS — I2510 Atherosclerotic heart disease of native coronary artery without angina pectoris: Secondary | ICD-10-CM

## 2014-02-03 DIAGNOSIS — Z9989 Dependence on other enabling machines and devices: Secondary | ICD-10-CM

## 2014-02-03 DIAGNOSIS — I10 Essential (primary) hypertension: Secondary | ICD-10-CM

## 2014-02-03 DIAGNOSIS — M79651 Pain in right thigh: Secondary | ICD-10-CM

## 2014-02-03 DIAGNOSIS — G4733 Obstructive sleep apnea (adult) (pediatric): Secondary | ICD-10-CM

## 2014-02-03 DIAGNOSIS — E785 Hyperlipidemia, unspecified: Secondary | ICD-10-CM

## 2014-02-03 DIAGNOSIS — M79604 Pain in right leg: Secondary | ICD-10-CM

## 2014-02-03 NOTE — Patient Instructions (Signed)
Your physician wants you to follow-up in: 6 months with Dr Berry. You will receive a reminder letter in the mail two months in advance. If you don't receive a letter, please call our office to schedule the follow-up appointment.  

## 2014-02-03 NOTE — Progress Notes (Signed)
Right Lower Extremity Venous Duplex Completed. [name] [name]

## 2014-02-03 NOTE — Assessment & Plan Note (Signed)
+   nuc study led to cath and CABG in 2008 Last nuc 2014 without ischemia  No chest pain, no SOB.  Stable.

## 2014-02-03 NOTE — Assessment & Plan Note (Signed)
No longer using cpap, this may be leading to her drowsiness.

## 2014-02-03 NOTE — Assessment & Plan Note (Signed)
On pravachol, she has not had lipids that I can find though they were ordered, we have asked her PCP to send [place] copies of her labs if no Lipid or thyroid would check.

## 2014-02-03 NOTE — Progress Notes (Signed)
02/03/2014   PCP: Arlis Porta, MD   Chief Complaint  Patient presents with  . Follow-up    6 mo. pain in inner thighs for past couple of weeks. swelling in feet and ankles.     Primary Cardiologist:Dr. Adora Fridge  HPI:  80 year old, moderately overweight, married [place] American female, mother of 89, grandmother to 4 grandchildren accompanied by one of her husband today. I an seeing in follow up for Dr. Gwenlyn Found.   She has a history of CAD status post coronary artery bypass grafting x5 by Dr. Gilford Raid 12/2006 with a LIMA to her LAD, sequential vein to a diagonal branch and obtuse marginal branch as well as a vein to the PDA. Her other problems include hypertension, hyperlipidemia and insulin-dependent diabetes. She had a Myoview performed in 2012 that showed lateral scar with mild superimposed ischemia though she denied any chest pain or shortness of breath. She also complained of claudication and Dopplers from our office showed a right ABI of 0.81 and a left of 0.98. She had a recent Myoview performed Jun 11, 2012, that showed similar findings to her prior study with lateral scar with moderate peri-infarct ischemia. Her major complaints are of daytime somnolence. She has had a stroke and is slowly rehabilitating. She also had an endovascular procedure on her left lower extremity for what sounds like critical limb ischemia by Dr. Lucky Cowboy in [place].  Today she complains of rt thigh pain.mid thigh to knee pain. She does sit a lot at home.   Her stents are on the Lt.  Leg She also complains of being sleepy frequently, she could not wear cpap. She had similar "sleepy" complaints last year.  Will check with her PCP for copies of lipid and thyroid.    No Known Allergies  Current Outpatient Prescriptions  Medication Sig Dispense Refill  . acetaminophen (TYLENOL) 500 MG tablet Take 500 mg by mouth every 6 (six) hours as needed.    [name] aspirin 81 MG tablet Take 81 mg by mouth  daily.    . carvedilol (COREG) 25 MG tablet Take 1 tablet by mouth 2 (two) times daily.    . clopidogrel (PLAVIX) 75 MG tablet Take 1 tablet by mouth daily.    . furosemide (LASIX) 40 MG tablet Take 40 mg by mouth daily.    [name] ibuprofen (ADVIL,MOTRIN) 200 MG tablet Take 200 mg by mouth every 6 (six) hours as needed.    [name] lisinopril (PRINIVIL,ZESTRIL) 40 MG tablet Take 40 mg by mouth daily.    [name] NOVOLOG MIX 70/30 (70-30) 100 UNIT/ML injection Take 68 units in the morning and 40 units at bedtime.    . polyethylene glycol (MIRALAX / GLYCOLAX) packet Take 17 g by mouth daily as needed.    . pravastatin (PRAVACHOL) 40 MG tablet Take 40 mg by mouth daily.    . ranitidine (ZANTAC) 150 MG tablet Take 150 mg by mouth 2 (two) times daily.     No current facility-administered medications for this visit.    Past Medical History  Diagnosis Date  . CAD (coronary artery disease) 2008    + nuc study led to cath and CABG  . HTN (hypertension)   . Hyperlipidemia LDL goal < 70   . CVA (cerebral infarction) 2014  . Diabetes mellitus type 2 with complications, uncontrolled     CAD, CVA  . H/O cardiovascular stress test 06/2012    Stable with  lateral scar and moderate. Infarction ischemia  . OSA on CPAP 2014     patient is just now beginning CPAP  . Daytime somnolence      hoping CPAP will be able to help this  . Obesity (BMI 30.0-34.9)   . H/O gastritis   . Mallory-Weiss tear     History of  . Claudication in peripheral vascular disease 05/2012     ABI right 0.81; left 0.98     Past Surgical History  Procedure Laterality Date  . Coronary artery bypass graft  12/31/2006    CABG x4, LIMA-LAD, sequential VG-diagonal branch of LAD, OM of left circumflex & PLA branch of RCA  . Cholecystectomy      KAJ:GOTLXBW:IO colds or fevers, no weight changes Skin:no rashes or ulcers HEENT:no blurred vision, no congestion CV:see HPI PUL:see HPI GI:no diarrhea constipation or melena, no indigestion GU:no  hematuria, no dysuria MS:no joint pain, no claudication Neuro:no syncope, no lightheadedness Endo: diabetes stable, no thyroid disease  Wt Readings from Last 3 Encounters:  02/03/14 199 lb 3.2 oz (90.357 kg)  06/18/13 191 lb (86.637 kg)  12/20/12 206 lb (93.441 kg)    PHYSICAL EXAM BP 134/60 mmHg  Pulse 55  Wt 199 lb 3.2 oz (90.357 kg) General:Pleasant  But flataffect, NAD Skin:Warm and dry, brisk capillary refill HEENT:normocephalic, sclera clear, mucus membranes moist Neck:supple, no JVD, no bruits  Heart:S1S2 RRR without murmur, gallup, rub or click Lungs:clear without rales, rhonchi, or wheezes MBT:DHRC, non tender, + BS, do not palpate liver spleen or masses Ext:no lower ext edema, 1+ pedal pulses, 2+ radial pulses, + tenderness Rt upper ext- thigh Neuro:alert and oriented X 3, MAE, follows commands, + facial symmetry  EKG:SB at 55, 1st degree AV block no acute changes. Lt ant FB.  ASSESSMENT AND PLAN CAD (coronary artery disease) + nuc study led to cath and CABG in 2008 Last nuc 2014 without ischemia  No chest pain, no SOB.  Stable.  Leg pain, right Rt thigh pain, + pain to palpation, rt thigh venous doppler ordered  Neg for DVT. She will follow up with her vascular MD.  OSA on CPAP No longer using cpap, this may be leading to her drowsiness.  Hyperlipidemia with target LDL less than 70 On pravachol, she has not had lipids that I can find though they were ordered, we have asked her PCP to send [place] copies of her labs if no Lipid or thyroid would check.     She will follow up with Dr. Adora Fridge in 6 months.

## 2014-02-03 NOTE — Assessment & Plan Note (Signed)
Rt thigh pain, + pain to palpation, rt thigh venous doppler ordered  Neg for DVT. She will follow up with her vascular MD.

## 2014-02-11 DIAGNOSIS — F0150 Vascular dementia without behavioral disturbance: Secondary | ICD-10-CM | POA: Diagnosis not present

## 2014-02-11 DIAGNOSIS — R2689 Other abnormalities of gait and mobility: Secondary | ICD-10-CM | POA: Diagnosis not present

## 2014-02-11 DIAGNOSIS — R200 Anesthesia of skin: Secondary | ICD-10-CM | POA: Diagnosis not present

## 2014-03-09 DIAGNOSIS — Z794 Long term (current) use of insulin: Secondary | ICD-10-CM | POA: Diagnosis not present

## 2014-03-09 DIAGNOSIS — I2510 Atherosclerotic heart disease of native coronary artery without angina pectoris: Secondary | ICD-10-CM | POA: Diagnosis not present

## 2014-03-09 DIAGNOSIS — F0150 Vascular dementia without behavioral disturbance: Secondary | ICD-10-CM | POA: Diagnosis not present

## 2014-03-09 DIAGNOSIS — R278 Other lack of coordination: Secondary | ICD-10-CM | POA: Diagnosis not present

## 2014-03-09 DIAGNOSIS — I6931 Cognitive deficits following cerebral infarction: Secondary | ICD-10-CM | POA: Diagnosis not present

## 2014-03-09 DIAGNOSIS — E785 Hyperlipidemia, unspecified: Secondary | ICD-10-CM | POA: Diagnosis not present

## 2014-03-09 DIAGNOSIS — R209 Unspecified disturbances of skin sensation: Secondary | ICD-10-CM | POA: Diagnosis not present

## 2014-03-09 DIAGNOSIS — I10 Essential (primary) hypertension: Secondary | ICD-10-CM | POA: Diagnosis not present

## 2014-03-09 DIAGNOSIS — E119 Type 2 diabetes mellitus without complications: Secondary | ICD-10-CM | POA: Diagnosis not present

## 2014-03-12 DIAGNOSIS — E119 Type 2 diabetes mellitus without complications: Secondary | ICD-10-CM | POA: Diagnosis not present

## 2014-03-12 DIAGNOSIS — E785 Hyperlipidemia, unspecified: Secondary | ICD-10-CM | POA: Diagnosis not present

## 2014-03-12 DIAGNOSIS — I10 Essential (primary) hypertension: Secondary | ICD-10-CM | POA: Diagnosis not present

## 2014-03-12 DIAGNOSIS — I6931 Cognitive deficits following cerebral infarction: Secondary | ICD-10-CM | POA: Diagnosis not present

## 2014-03-12 DIAGNOSIS — F0150 Vascular dementia without behavioral disturbance: Secondary | ICD-10-CM | POA: Diagnosis not present

## 2014-03-12 DIAGNOSIS — Z794 Long term (current) use of insulin: Secondary | ICD-10-CM | POA: Diagnosis not present

## 2014-03-12 DIAGNOSIS — R278 Other lack of coordination: Secondary | ICD-10-CM | POA: Diagnosis not present

## 2014-03-12 DIAGNOSIS — R209 Unspecified disturbances of skin sensation: Secondary | ICD-10-CM | POA: Diagnosis not present

## 2014-03-12 DIAGNOSIS — I2510 Atherosclerotic heart disease of native coronary artery without angina pectoris: Secondary | ICD-10-CM | POA: Diagnosis not present

## 2014-03-16 DIAGNOSIS — I639 Cerebral infarction, unspecified: Secondary | ICD-10-CM | POA: Diagnosis not present

## 2014-03-16 DIAGNOSIS — R600 Localized edema: Secondary | ICD-10-CM | POA: Diagnosis not present

## 2014-03-16 DIAGNOSIS — I739 Peripheral vascular disease, unspecified: Secondary | ICD-10-CM | POA: Diagnosis not present

## 2014-03-16 DIAGNOSIS — E1165 Type 2 diabetes mellitus with hyperglycemia: Secondary | ICD-10-CM | POA: Diagnosis not present

## 2014-03-16 DIAGNOSIS — I10 Essential (primary) hypertension: Secondary | ICD-10-CM | POA: Diagnosis not present

## 2014-03-16 DIAGNOSIS — N289 Disorder of kidney and ureter, unspecified: Secondary | ICD-10-CM | POA: Diagnosis not present

## 2014-03-16 DIAGNOSIS — I213 ST elevation (STEMI) myocardial infarction of unspecified site: Secondary | ICD-10-CM | POA: Diagnosis not present

## 2014-03-16 DIAGNOSIS — E780 Pure hypercholesterolemia: Secondary | ICD-10-CM | POA: Diagnosis not present

## 2014-03-17 DIAGNOSIS — I6931 Cognitive deficits following cerebral infarction: Secondary | ICD-10-CM | POA: Diagnosis not present

## 2014-03-17 DIAGNOSIS — F0150 Vascular dementia without behavioral disturbance: Secondary | ICD-10-CM | POA: Diagnosis not present

## 2014-03-17 DIAGNOSIS — I2510 Atherosclerotic heart disease of native coronary artery without angina pectoris: Secondary | ICD-10-CM | POA: Diagnosis not present

## 2014-03-17 DIAGNOSIS — R278 Other lack of coordination: Secondary | ICD-10-CM | POA: Diagnosis not present

## 2014-03-17 DIAGNOSIS — I10 Essential (primary) hypertension: Secondary | ICD-10-CM | POA: Diagnosis not present

## 2014-03-17 DIAGNOSIS — Z794 Long term (current) use of insulin: Secondary | ICD-10-CM | POA: Diagnosis not present

## 2014-03-17 DIAGNOSIS — E785 Hyperlipidemia, unspecified: Secondary | ICD-10-CM | POA: Diagnosis not present

## 2014-03-17 DIAGNOSIS — R209 Unspecified disturbances of skin sensation: Secondary | ICD-10-CM | POA: Diagnosis not present

## 2014-03-17 DIAGNOSIS — E119 Type 2 diabetes mellitus without complications: Secondary | ICD-10-CM | POA: Diagnosis not present

## 2014-03-18 DIAGNOSIS — E119 Type 2 diabetes mellitus without complications: Secondary | ICD-10-CM | POA: Diagnosis not present

## 2014-03-18 DIAGNOSIS — I10 Essential (primary) hypertension: Secondary | ICD-10-CM | POA: Diagnosis not present

## 2014-03-18 DIAGNOSIS — E785 Hyperlipidemia, unspecified: Secondary | ICD-10-CM | POA: Diagnosis not present

## 2014-03-18 DIAGNOSIS — R209 Unspecified disturbances of skin sensation: Secondary | ICD-10-CM | POA: Diagnosis not present

## 2014-03-18 DIAGNOSIS — I6931 Cognitive deficits following cerebral infarction: Secondary | ICD-10-CM | POA: Diagnosis not present

## 2014-03-18 DIAGNOSIS — R278 Other lack of coordination: Secondary | ICD-10-CM | POA: Diagnosis not present

## 2014-03-18 DIAGNOSIS — Z794 Long term (current) use of insulin: Secondary | ICD-10-CM | POA: Diagnosis not present

## 2014-03-18 DIAGNOSIS — F0150 Vascular dementia without behavioral disturbance: Secondary | ICD-10-CM | POA: Diagnosis not present

## 2014-03-18 DIAGNOSIS — I2510 Atherosclerotic heart disease of native coronary artery without angina pectoris: Secondary | ICD-10-CM | POA: Diagnosis not present

## 2014-03-19 DIAGNOSIS — E785 Hyperlipidemia, unspecified: Secondary | ICD-10-CM | POA: Diagnosis not present

## 2014-03-19 DIAGNOSIS — R278 Other lack of coordination: Secondary | ICD-10-CM | POA: Diagnosis not present

## 2014-03-19 DIAGNOSIS — F0150 Vascular dementia without behavioral disturbance: Secondary | ICD-10-CM | POA: Diagnosis not present

## 2014-03-19 DIAGNOSIS — I6931 Cognitive deficits following cerebral infarction: Secondary | ICD-10-CM | POA: Diagnosis not present

## 2014-03-19 DIAGNOSIS — Z794 Long term (current) use of insulin: Secondary | ICD-10-CM | POA: Diagnosis not present

## 2014-03-19 DIAGNOSIS — I2510 Atherosclerotic heart disease of native coronary artery without angina pectoris: Secondary | ICD-10-CM | POA: Diagnosis not present

## 2014-03-19 DIAGNOSIS — I10 Essential (primary) hypertension: Secondary | ICD-10-CM | POA: Diagnosis not present

## 2014-03-19 DIAGNOSIS — E119 Type 2 diabetes mellitus without complications: Secondary | ICD-10-CM | POA: Diagnosis not present

## 2014-03-19 DIAGNOSIS — R209 Unspecified disturbances of skin sensation: Secondary | ICD-10-CM | POA: Diagnosis not present

## 2014-03-20 DIAGNOSIS — E785 Hyperlipidemia, unspecified: Secondary | ICD-10-CM | POA: Diagnosis not present

## 2014-03-20 DIAGNOSIS — R209 Unspecified disturbances of skin sensation: Secondary | ICD-10-CM | POA: Diagnosis not present

## 2014-03-20 DIAGNOSIS — I2510 Atherosclerotic heart disease of native coronary artery without angina pectoris: Secondary | ICD-10-CM | POA: Diagnosis not present

## 2014-03-20 DIAGNOSIS — I10 Essential (primary) hypertension: Secondary | ICD-10-CM | POA: Diagnosis not present

## 2014-03-20 DIAGNOSIS — R278 Other lack of coordination: Secondary | ICD-10-CM | POA: Diagnosis not present

## 2014-03-20 DIAGNOSIS — E119 Type 2 diabetes mellitus without complications: Secondary | ICD-10-CM | POA: Diagnosis not present

## 2014-03-20 DIAGNOSIS — F0150 Vascular dementia without behavioral disturbance: Secondary | ICD-10-CM | POA: Diagnosis not present

## 2014-03-20 DIAGNOSIS — I6931 Cognitive deficits following cerebral infarction: Secondary | ICD-10-CM | POA: Diagnosis not present

## 2014-03-20 DIAGNOSIS — Z794 Long term (current) use of insulin: Secondary | ICD-10-CM | POA: Diagnosis not present

## 2014-03-25 DIAGNOSIS — I6931 Cognitive deficits following cerebral infarction: Secondary | ICD-10-CM | POA: Diagnosis not present

## 2014-03-25 DIAGNOSIS — I10 Essential (primary) hypertension: Secondary | ICD-10-CM | POA: Diagnosis not present

## 2014-03-25 DIAGNOSIS — E785 Hyperlipidemia, unspecified: Secondary | ICD-10-CM | POA: Diagnosis not present

## 2014-03-25 DIAGNOSIS — E119 Type 2 diabetes mellitus without complications: Secondary | ICD-10-CM | POA: Diagnosis not present

## 2014-03-25 DIAGNOSIS — I2510 Atherosclerotic heart disease of native coronary artery without angina pectoris: Secondary | ICD-10-CM | POA: Diagnosis not present

## 2014-03-25 DIAGNOSIS — Z794 Long term (current) use of insulin: Secondary | ICD-10-CM | POA: Diagnosis not present

## 2014-03-25 DIAGNOSIS — R209 Unspecified disturbances of skin sensation: Secondary | ICD-10-CM | POA: Diagnosis not present

## 2014-03-25 DIAGNOSIS — F0150 Vascular dementia without behavioral disturbance: Secondary | ICD-10-CM | POA: Diagnosis not present

## 2014-03-25 DIAGNOSIS — R278 Other lack of coordination: Secondary | ICD-10-CM | POA: Diagnosis not present

## 2014-03-27 DIAGNOSIS — I10 Essential (primary) hypertension: Secondary | ICD-10-CM | POA: Diagnosis not present

## 2014-03-27 DIAGNOSIS — E785 Hyperlipidemia, unspecified: Secondary | ICD-10-CM | POA: Diagnosis not present

## 2014-03-27 DIAGNOSIS — I6931 Cognitive deficits following cerebral infarction: Secondary | ICD-10-CM | POA: Diagnosis not present

## 2014-03-27 DIAGNOSIS — R209 Unspecified disturbances of skin sensation: Secondary | ICD-10-CM | POA: Diagnosis not present

## 2014-03-27 DIAGNOSIS — E119 Type 2 diabetes mellitus without complications: Secondary | ICD-10-CM | POA: Diagnosis not present

## 2014-03-27 DIAGNOSIS — R278 Other lack of coordination: Secondary | ICD-10-CM | POA: Diagnosis not present

## 2014-03-27 DIAGNOSIS — I2510 Atherosclerotic heart disease of native coronary artery without angina pectoris: Secondary | ICD-10-CM | POA: Diagnosis not present

## 2014-03-27 DIAGNOSIS — F0150 Vascular dementia without behavioral disturbance: Secondary | ICD-10-CM | POA: Diagnosis not present

## 2014-03-27 DIAGNOSIS — Z794 Long term (current) use of insulin: Secondary | ICD-10-CM | POA: Diagnosis not present

## 2014-03-30 DIAGNOSIS — F0150 Vascular dementia without behavioral disturbance: Secondary | ICD-10-CM | POA: Diagnosis not present

## 2014-03-30 DIAGNOSIS — Z794 Long term (current) use of insulin: Secondary | ICD-10-CM | POA: Diagnosis not present

## 2014-03-30 DIAGNOSIS — I2510 Atherosclerotic heart disease of native coronary artery without angina pectoris: Secondary | ICD-10-CM | POA: Diagnosis not present

## 2014-03-30 DIAGNOSIS — I10 Essential (primary) hypertension: Secondary | ICD-10-CM | POA: Diagnosis not present

## 2014-03-30 DIAGNOSIS — E785 Hyperlipidemia, unspecified: Secondary | ICD-10-CM | POA: Diagnosis not present

## 2014-03-30 DIAGNOSIS — R209 Unspecified disturbances of skin sensation: Secondary | ICD-10-CM | POA: Diagnosis not present

## 2014-03-30 DIAGNOSIS — R278 Other lack of coordination: Secondary | ICD-10-CM | POA: Diagnosis not present

## 2014-03-30 DIAGNOSIS — E119 Type 2 diabetes mellitus without complications: Secondary | ICD-10-CM | POA: Diagnosis not present

## 2014-03-30 DIAGNOSIS — I6931 Cognitive deficits following cerebral infarction: Secondary | ICD-10-CM | POA: Diagnosis not present

## 2014-04-01 DIAGNOSIS — I6931 Cognitive deficits following cerebral infarction: Secondary | ICD-10-CM | POA: Diagnosis not present

## 2014-04-01 DIAGNOSIS — I10 Essential (primary) hypertension: Secondary | ICD-10-CM | POA: Diagnosis not present

## 2014-04-01 DIAGNOSIS — E785 Hyperlipidemia, unspecified: Secondary | ICD-10-CM | POA: Diagnosis not present

## 2014-04-01 DIAGNOSIS — R209 Unspecified disturbances of skin sensation: Secondary | ICD-10-CM | POA: Diagnosis not present

## 2014-04-01 DIAGNOSIS — I2510 Atherosclerotic heart disease of native coronary artery without angina pectoris: Secondary | ICD-10-CM | POA: Diagnosis not present

## 2014-04-01 DIAGNOSIS — F0150 Vascular dementia without behavioral disturbance: Secondary | ICD-10-CM | POA: Diagnosis not present

## 2014-04-01 DIAGNOSIS — E119 Type 2 diabetes mellitus without complications: Secondary | ICD-10-CM | POA: Diagnosis not present

## 2014-04-01 DIAGNOSIS — Z794 Long term (current) use of insulin: Secondary | ICD-10-CM | POA: Diagnosis not present

## 2014-04-01 DIAGNOSIS — R278 Other lack of coordination: Secondary | ICD-10-CM | POA: Diagnosis not present

## 2014-04-02 DIAGNOSIS — I10 Essential (primary) hypertension: Secondary | ICD-10-CM | POA: Diagnosis not present

## 2014-04-02 DIAGNOSIS — R278 Other lack of coordination: Secondary | ICD-10-CM | POA: Diagnosis not present

## 2014-04-02 DIAGNOSIS — Z794 Long term (current) use of insulin: Secondary | ICD-10-CM | POA: Diagnosis not present

## 2014-04-02 DIAGNOSIS — I2510 Atherosclerotic heart disease of native coronary artery without angina pectoris: Secondary | ICD-10-CM | POA: Diagnosis not present

## 2014-04-02 DIAGNOSIS — R209 Unspecified disturbances of skin sensation: Secondary | ICD-10-CM | POA: Diagnosis not present

## 2014-04-02 DIAGNOSIS — I6931 Cognitive deficits following cerebral infarction: Secondary | ICD-10-CM | POA: Diagnosis not present

## 2014-04-02 DIAGNOSIS — E785 Hyperlipidemia, unspecified: Secondary | ICD-10-CM | POA: Diagnosis not present

## 2014-04-02 DIAGNOSIS — F0150 Vascular dementia without behavioral disturbance: Secondary | ICD-10-CM | POA: Diagnosis not present

## 2014-04-02 DIAGNOSIS — E119 Type 2 diabetes mellitus without complications: Secondary | ICD-10-CM | POA: Diagnosis not present

## 2014-04-03 DIAGNOSIS — E785 Hyperlipidemia, unspecified: Secondary | ICD-10-CM | POA: Diagnosis not present

## 2014-04-03 DIAGNOSIS — I10 Essential (primary) hypertension: Secondary | ICD-10-CM | POA: Diagnosis not present

## 2014-04-03 DIAGNOSIS — I6931 Cognitive deficits following cerebral infarction: Secondary | ICD-10-CM | POA: Diagnosis not present

## 2014-04-03 DIAGNOSIS — R278 Other lack of coordination: Secondary | ICD-10-CM | POA: Diagnosis not present

## 2014-04-03 DIAGNOSIS — F0150 Vascular dementia without behavioral disturbance: Secondary | ICD-10-CM | POA: Diagnosis not present

## 2014-04-03 DIAGNOSIS — E119 Type 2 diabetes mellitus without complications: Secondary | ICD-10-CM | POA: Diagnosis not present

## 2014-04-03 DIAGNOSIS — I2510 Atherosclerotic heart disease of native coronary artery without angina pectoris: Secondary | ICD-10-CM | POA: Diagnosis not present

## 2014-04-03 DIAGNOSIS — Z794 Long term (current) use of insulin: Secondary | ICD-10-CM | POA: Diagnosis not present

## 2014-04-03 DIAGNOSIS — R209 Unspecified disturbances of skin sensation: Secondary | ICD-10-CM | POA: Diagnosis not present

## 2014-04-07 DIAGNOSIS — I6931 Cognitive deficits following cerebral infarction: Secondary | ICD-10-CM | POA: Diagnosis not present

## 2014-04-07 DIAGNOSIS — F0150 Vascular dementia without behavioral disturbance: Secondary | ICD-10-CM | POA: Diagnosis not present

## 2014-04-07 DIAGNOSIS — Z794 Long term (current) use of insulin: Secondary | ICD-10-CM | POA: Diagnosis not present

## 2014-04-07 DIAGNOSIS — E119 Type 2 diabetes mellitus without complications: Secondary | ICD-10-CM | POA: Diagnosis not present

## 2014-04-07 DIAGNOSIS — I2510 Atherosclerotic heart disease of native coronary artery without angina pectoris: Secondary | ICD-10-CM | POA: Diagnosis not present

## 2014-04-07 DIAGNOSIS — I10 Essential (primary) hypertension: Secondary | ICD-10-CM | POA: Diagnosis not present

## 2014-04-08 DIAGNOSIS — I10 Essential (primary) hypertension: Secondary | ICD-10-CM | POA: Diagnosis not present

## 2014-04-08 DIAGNOSIS — Z794 Long term (current) use of insulin: Secondary | ICD-10-CM | POA: Diagnosis not present

## 2014-04-08 DIAGNOSIS — E119 Type 2 diabetes mellitus without complications: Secondary | ICD-10-CM | POA: Diagnosis not present

## 2014-04-08 DIAGNOSIS — F0150 Vascular dementia without behavioral disturbance: Secondary | ICD-10-CM | POA: Diagnosis not present

## 2014-04-08 DIAGNOSIS — I2510 Atherosclerotic heart disease of native coronary artery without angina pectoris: Secondary | ICD-10-CM | POA: Diagnosis not present

## 2014-04-08 DIAGNOSIS — I6931 Cognitive deficits following cerebral infarction: Secondary | ICD-10-CM | POA: Diagnosis not present

## 2014-04-10 DIAGNOSIS — Z794 Long term (current) use of insulin: Secondary | ICD-10-CM | POA: Diagnosis not present

## 2014-04-10 DIAGNOSIS — I10 Essential (primary) hypertension: Secondary | ICD-10-CM | POA: Diagnosis not present

## 2014-04-10 DIAGNOSIS — I2510 Atherosclerotic heart disease of native coronary artery without angina pectoris: Secondary | ICD-10-CM | POA: Diagnosis not present

## 2014-04-10 DIAGNOSIS — E119 Type 2 diabetes mellitus without complications: Secondary | ICD-10-CM | POA: Diagnosis not present

## 2014-04-10 DIAGNOSIS — I6931 Cognitive deficits following cerebral infarction: Secondary | ICD-10-CM | POA: Diagnosis not present

## 2014-04-10 DIAGNOSIS — F0150 Vascular dementia without behavioral disturbance: Secondary | ICD-10-CM | POA: Diagnosis not present

## 2014-04-14 DIAGNOSIS — F0150 Vascular dementia without behavioral disturbance: Secondary | ICD-10-CM | POA: Diagnosis not present

## 2014-04-14 DIAGNOSIS — E119 Type 2 diabetes mellitus without complications: Secondary | ICD-10-CM | POA: Diagnosis not present

## 2014-04-14 DIAGNOSIS — I2510 Atherosclerotic heart disease of native coronary artery without angina pectoris: Secondary | ICD-10-CM | POA: Diagnosis not present

## 2014-04-14 DIAGNOSIS — I10 Essential (primary) hypertension: Secondary | ICD-10-CM | POA: Diagnosis not present

## 2014-04-14 DIAGNOSIS — Z794 Long term (current) use of insulin: Secondary | ICD-10-CM | POA: Diagnosis not present

## 2014-04-14 DIAGNOSIS — I6931 Cognitive deficits following cerebral infarction: Secondary | ICD-10-CM | POA: Diagnosis not present

## 2014-04-15 DIAGNOSIS — I6931 Cognitive deficits following cerebral infarction: Secondary | ICD-10-CM | POA: Diagnosis not present

## 2014-04-15 DIAGNOSIS — I2510 Atherosclerotic heart disease of native coronary artery without angina pectoris: Secondary | ICD-10-CM | POA: Diagnosis not present

## 2014-04-15 DIAGNOSIS — E119 Type 2 diabetes mellitus without complications: Secondary | ICD-10-CM | POA: Diagnosis not present

## 2014-04-15 DIAGNOSIS — Z794 Long term (current) use of insulin: Secondary | ICD-10-CM | POA: Diagnosis not present

## 2014-04-15 DIAGNOSIS — F0150 Vascular dementia without behavioral disturbance: Secondary | ICD-10-CM | POA: Diagnosis not present

## 2014-04-15 DIAGNOSIS — I10 Essential (primary) hypertension: Secondary | ICD-10-CM | POA: Diagnosis not present

## 2014-04-22 DIAGNOSIS — I6931 Cognitive deficits following cerebral infarction: Secondary | ICD-10-CM | POA: Diagnosis not present

## 2014-04-22 DIAGNOSIS — I10 Essential (primary) hypertension: Secondary | ICD-10-CM | POA: Diagnosis not present

## 2014-04-22 DIAGNOSIS — Z794 Long term (current) use of insulin: Secondary | ICD-10-CM | POA: Diagnosis not present

## 2014-04-22 DIAGNOSIS — I2510 Atherosclerotic heart disease of native coronary artery without angina pectoris: Secondary | ICD-10-CM | POA: Diagnosis not present

## 2014-04-22 DIAGNOSIS — F0150 Vascular dementia without behavioral disturbance: Secondary | ICD-10-CM | POA: Diagnosis not present

## 2014-04-22 DIAGNOSIS — E119 Type 2 diabetes mellitus without complications: Secondary | ICD-10-CM | POA: Diagnosis not present

## 2014-04-30 DIAGNOSIS — I2510 Atherosclerotic heart disease of native coronary artery without angina pectoris: Secondary | ICD-10-CM | POA: Diagnosis not present

## 2014-04-30 DIAGNOSIS — I10 Essential (primary) hypertension: Secondary | ICD-10-CM | POA: Diagnosis not present

## 2014-04-30 DIAGNOSIS — E119 Type 2 diabetes mellitus without complications: Secondary | ICD-10-CM | POA: Diagnosis not present

## 2014-04-30 DIAGNOSIS — I6931 Cognitive deficits following cerebral infarction: Secondary | ICD-10-CM | POA: Diagnosis not present

## 2014-04-30 DIAGNOSIS — Z794 Long term (current) use of insulin: Secondary | ICD-10-CM | POA: Diagnosis not present

## 2014-04-30 DIAGNOSIS — F0150 Vascular dementia without behavioral disturbance: Secondary | ICD-10-CM | POA: Diagnosis not present

## 2014-05-07 ENCOUNTER — Telehealth: Payer: Self-pay | Admitting: Cardiovascular Disease

## 2014-05-07 DIAGNOSIS — F0150 Vascular dementia without behavioral disturbance: Secondary | ICD-10-CM | POA: Diagnosis not present

## 2014-05-07 DIAGNOSIS — I6931 Cognitive deficits following cerebral infarction: Secondary | ICD-10-CM | POA: Diagnosis not present

## 2014-05-07 DIAGNOSIS — I10 Essential (primary) hypertension: Secondary | ICD-10-CM | POA: Diagnosis not present

## 2014-05-07 DIAGNOSIS — E119 Type 2 diabetes mellitus without complications: Secondary | ICD-10-CM | POA: Diagnosis not present

## 2014-05-07 DIAGNOSIS — Z794 Long term (current) use of insulin: Secondary | ICD-10-CM | POA: Diagnosis not present

## 2014-05-07 DIAGNOSIS — I2510 Atherosclerotic heart disease of native coronary artery without angina pectoris: Secondary | ICD-10-CM | POA: Diagnosis not present

## 2014-05-08 NOTE — Telephone Encounter (Signed)
Closed enounter °

## 2014-05-14 DIAGNOSIS — Z794 Long term (current) use of insulin: Secondary | ICD-10-CM | POA: Diagnosis not present

## 2014-05-14 DIAGNOSIS — E119 Type 2 diabetes mellitus without complications: Secondary | ICD-10-CM | POA: Diagnosis not present

## 2014-05-14 DIAGNOSIS — F0150 Vascular dementia without behavioral disturbance: Secondary | ICD-10-CM | POA: Diagnosis not present

## 2014-05-14 DIAGNOSIS — I2510 Atherosclerotic heart disease of native coronary artery without angina pectoris: Secondary | ICD-10-CM | POA: Diagnosis not present

## 2014-05-14 DIAGNOSIS — I6931 Cognitive deficits following cerebral infarction: Secondary | ICD-10-CM | POA: Diagnosis not present

## 2014-05-14 DIAGNOSIS — I10 Essential (primary) hypertension: Secondary | ICD-10-CM | POA: Diagnosis not present

## 2014-05-18 DIAGNOSIS — E119 Type 2 diabetes mellitus without complications: Secondary | ICD-10-CM | POA: Diagnosis not present

## 2014-05-18 DIAGNOSIS — I6931 Cognitive deficits following cerebral infarction: Secondary | ICD-10-CM | POA: Diagnosis not present

## 2014-05-18 DIAGNOSIS — Z794 Long term (current) use of insulin: Secondary | ICD-10-CM | POA: Diagnosis not present

## 2014-05-18 DIAGNOSIS — I2510 Atherosclerotic heart disease of native coronary artery without angina pectoris: Secondary | ICD-10-CM | POA: Diagnosis not present

## 2014-05-18 DIAGNOSIS — I10 Essential (primary) hypertension: Secondary | ICD-10-CM | POA: Diagnosis not present

## 2014-05-18 DIAGNOSIS — F0150 Vascular dementia without behavioral disturbance: Secondary | ICD-10-CM | POA: Diagnosis not present

## 2014-05-26 DIAGNOSIS — K219 Gastro-esophageal reflux disease without esophagitis: Secondary | ICD-10-CM | POA: Diagnosis not present

## 2014-05-26 DIAGNOSIS — I10 Essential (primary) hypertension: Secondary | ICD-10-CM | POA: Diagnosis not present

## 2014-05-26 DIAGNOSIS — R600 Localized edema: Secondary | ICD-10-CM | POA: Diagnosis not present

## 2014-05-26 DIAGNOSIS — N289 Disorder of kidney and ureter, unspecified: Secondary | ICD-10-CM | POA: Diagnosis not present

## 2014-05-26 DIAGNOSIS — E1165 Type 2 diabetes mellitus with hyperglycemia: Secondary | ICD-10-CM | POA: Diagnosis not present

## 2014-05-26 DIAGNOSIS — I739 Peripheral vascular disease, unspecified: Secondary | ICD-10-CM | POA: Diagnosis not present

## 2014-05-26 DIAGNOSIS — I213 ST elevation (STEMI) myocardial infarction of unspecified site: Secondary | ICD-10-CM | POA: Diagnosis not present

## 2014-05-28 DIAGNOSIS — I10 Essential (primary) hypertension: Secondary | ICD-10-CM | POA: Diagnosis not present

## 2014-05-28 DIAGNOSIS — Z794 Long term (current) use of insulin: Secondary | ICD-10-CM | POA: Diagnosis not present

## 2014-05-28 DIAGNOSIS — E119 Type 2 diabetes mellitus without complications: Secondary | ICD-10-CM | POA: Diagnosis not present

## 2014-05-28 DIAGNOSIS — I2510 Atherosclerotic heart disease of native coronary artery without angina pectoris: Secondary | ICD-10-CM | POA: Diagnosis not present

## 2014-05-28 DIAGNOSIS — F0150 Vascular dementia without behavioral disturbance: Secondary | ICD-10-CM | POA: Diagnosis not present

## 2014-05-28 DIAGNOSIS — I6931 Cognitive deficits following cerebral infarction: Secondary | ICD-10-CM | POA: Diagnosis not present

## 2014-05-29 NOTE — H&P (Signed)
PATIENT NAME:  [name] [name], [name] [name] MR#:  [id number] DATE OF BIRTH:  [birth year]  DATE OF ADMISSION:  12/11/2012  PRIMARY CARE PHYSICIAN: Dr. Luan Pulling in [place]: Woke up this a.m. and felt her strength was gone. She was unable to get up. She was afraid to walk and she was started stuttering and things were different. Of note, she did have an episode about a month ago where she was going to her gynecology appointment and she fell asleep in the car and woke up and was not walking that well. She has been more forgetful, and she was thought to have a stroke at that point in time. No further work-up was done at that time. Last night the patient did have a low sugar and had some sweating. She has also been on dicloxacillin for a cellulitis of the left lower extremity. In the ER the ER physician thought the patient may have had a stroke, but the CAT scan was negative. The patient was found to have an elevated white blood cell count and elevated creatinine. Hospitalist services were contacted for further evaluation.   PAST MEDICAL HISTORY: Hypertension, diabetes, hyperlipidemia, peripheral vascular disease, coronary artery disease, possible transient ischemic attack, hernia, constipation, some anxiety and depression.   PAST SURGICAL HISTORY: CABG, cholecystectomy, cataracts.   ALLERGIES: No known drug allergies.   MEDICATIONS: As per prescription writer include: Amlodipine 5 mg daily, aspirin 81 mg daily, Coreg 25 mg twice a day, Pletal 100 mg daily, Plavix 75 mg daily, dicloxacillin 500 mg 4 times a day, furosemide 20 mg 2 tablets daily, hydrochlorothiazide, lisinopril 25/20, 1 tablet daily, ibuprofen 200 mg 3 capsules 3 times a day as needed for pain, NovoLog Mix 70/30, insulin 82 units injection in the morning 52 units in the evening, MiraLAX 17 grams daily pravastatin 40 mg at bedtime, ranitidine 150 mg twice a day.   SOCIAL HISTORY: No smoking. No alcohol. No drug use. Worked in a [employer].   FAMILY HISTORY: Mother died in her 50s of a CVA. Father died of Alzheimer's.   REVIEW OF SYSTEMS:  CONSTITUTIONAL: Positive for sweating last night. Positive for fatigue. No weight gain. No weight loss.  HEENT: Eyes: She says she has trouble reading. The words blend together. She does wear glasses. Ears, nose, mouth, and throat: No hearing loss. No sore throat. No difficulty swallowing. Positive for slurred speech.  CARDIOVASCULAR: No chest pain. No palpitations.  RESPIRATORY: No shortness of breath. No coughing. No sputum. No hemoptysis.  GASTROINTESTINAL: Positive for occasional vomiting. No abdominal pain. Positive for constipation. No bright red blood per rectum. No melena.  GENITOURINARY: No burning on urination. No hematuria.  MUSCULOSKELETAL: No joint pain or muscle pain.  INTEGUMENT: No rashes or eruptions.  NEUROLOGIC: No fainting or blackouts. Generalized weakness.  PSYCHIATRIC: Positive for anxiety and depression.  ENDOCRINE: No thyroid problems.  HEMATOLOGIC/LYMPHATIC: No anemia. No easy bruising or bleeding.   PHYSICAL EXAMINATION: VITAL SIGNS: On presentation included a temperature of 97.4, pulse 64, respirations 20, blood pressure 174/76, pulse oximetry 98% on room air.  GENERAL: No respiratory distress.  HEENT: Eyes: Conjunctivae and lids normal. Pupils equal, round, and reactive to light. Extraocular muscles intact. No nystagmus. Ears, nose, mouth, and throat: Tympanic membranes: No erythema. Nasal mucosa: No erythema. Throat: No erythema, no exudate seen. Lips and gums: No lesions.  NECK: No JVD. No bruits. No lymphadenopathy. No thyromegaly. No thyroid nodules palpated.  LUNGS: Clear to auscultation. No use of accessory muscles  to breathe. No rhonchi, rales, or wheeze heard.  CARDIOVASCULAR: S1, S2 soft, no gallops, rubs or murmurs heard. Carotid upstroke 2+ bilaterally. No bruits. Dorsalis pedis pulses 2+ bilaterally.  Edema 2+ of the lower  extremity.  ABDOMEN: Soft, nontender. No organomegaly/splenomegaly. Normoactive bowel sounds. No masses felt.  LYMPHATIC: No lymph nodes in the neck.  MUSCULOSKELETAL: 2+ edema. No clubbing. No cyanosis.  SKIN: Positive erythema left lower extremity on the medial shin. Slight warmth   NEUROLOGIC: Cranial nerves II through XII grossly intact except for tongue deviation to the right when sticking out the tongue. Power 4/5 upper and lower extremities bilaterally. Heel shin impaired both legs. Finger-nose grossly intact bilaterally Babinski equivocal on the left, negative on the right. Sensation grossly intact to light touch. Speech stuttering.  PSYCHIATRIC: The patient is alert, oriented to person, place and time.   LABORATORY AND RADIOLOGICAL DATA: URINALYSIS: Protein 30 mg/dL. CT scan of the head: Diffuse atrophy with small vessel disease. Chest x-ray negative. White blood cell count 16.2, H and H 12.3 and 35.6, platelet count of 318, glucose 100, BUN 24, creatinine 1.5, sodium 140, potassium 4.2, chloride 110, CO2 27. Liver function tests normal. Albumin low at 3.   ASSESSMENT AND PLAN: 1. Cerebrovascular accident with generalized weakness, slurred speech, stuttering, difficulty with ambulation. We will get an MRI of the brain to rule out stroke, carotid ultrasound, echocardiogram. PT, OT and speech therapy consults. The patient is already on aspirin and Plavix. Unable to do anything further besides those medications.  2. Left lower extremity cellulitis partially treated with dicloxacillin. We will give IV Ancef while here.  3. Hypertension. Blood pressure currently okay, we will allow permissive hypertension. We will  hold all medications except for the Coreg at this point.  4. Hyperlipidemia: Switch over to Lipitor and check a lipid profile in the a.m.  5. Diabetes with a low sugar last night. We will put on sliding scale at this point and continue to monitor. Check a hemoglobin A1c.  6. Likely  chronic kidney disease. We will hold Lasix, lisinopril HCT and give IV fluid hydration and see if the creatinine improves in the morning.   TIME SPENT ON ADMISSION: 55 minutes.    ____________________________ Tana Conch. [name] [name] Peer, MD rjw:sg D: 12/11/2012 13:16:00 ET T: 12/11/2012 13:25:57 ET JOB#: [id number]  cc: Tana Conch. [name] [name] Peer, MD, <Dictator> Arlis Porta., MD  Marisue Brooklyn MD ELECTRONICALLY SIGNED 12/12/2012 13:36

## 2014-05-29 NOTE — Op Note (Signed)
PATIENT NAME:  [name] [name], [name] [name] MR#:  [id number] DATE OF BIRTH:  30-Apr-1931  DATE OF PROCEDURE:  01/13/2013  PREOPERATIVE DIAGNOSES:  1.  Peripheral arterial disease with ulceration, left lower extremity.  2.  Chronic kidney disease.  3.  Hypertension.   POSTOPERATIVE DIAGNOSES:  1.  Peripheral arterial disease with ulceration, left lower extremity.  2.  Chronic kidney disease.  3.  Hypertension.   PROCEDURES:  1.  Ultrasound guidance for vascular access, right femoral artery.  2.  Catheter placement to left peroneal artery from right femoral approach.  3.  Aortogram and selective left lower extremity angiogram.  4.  Percutaneous transluminal angioplasty of distal popliteal artery and tibioperoneal trunk with 4 mm diameter angioplasty balloon.  5.  Percutaneous transluminal angioplasty of the distal superficial femoral artery and proximal superficial femoral artery with 5 mm diameter angioplasty balloon.  6.  Lutonix drug-eluting balloon to proximal superficial femoral artery with 5 mm diameter angioplasty balloon.  7.  StarClose closure device, right femoral artery.   SURGEON: Algernon Huxley, M.D.   ANESTHESIA: Local with moderate conscious sedation.   ESTIMATED BLOOD LOSS: 25 mL.   FLUOROSCOPY TIME: Approximately 4 minutes.   CONTRAST USED: 65 mL.   INDICATION FOR PROCEDURE: This is an individual, who presented the office with a nonhealing ulceration of the left lower extremity. She had reduced ABIs bilaterally and duplex showing stenosis. She is brought in for angiography for further evaluation and possible treatment. Risks and benefits were discussed. Informed consent was obtained.   DESCRIPTION OF PROCEDURE: The patient is brought to the vascular interventional radiology suite. Groins were shaved and prepped and a sterile surgical field was created. Due to her body habitus, ultrasound was used to visualize the right femoral artery. This was accessed under direct ultrasound guidance  without difficulty with a Seldinger needle and a J-wire was placed. A 5-French sheath was placed and a pigtail catheter was placed in the aorta at the L1-L2 level. AP aortogram was performed. This demonstrated patent renal arteries bilaterally. The aorta and iliac segments were widely patent without significant stenosis. I then crossed the aortic bifurcation and advanced to the left femoral head and selective left lower extremity angiogram was then performed. This showed several areas of stenosis in the superficial femoral artery. The worst was in the proximal superficial femoral artery and was in the 80% to 85% range. The distal superficial femoral artery had a 60% to 65% stenosis. The below-knee popliteal artery had about a 75% to 80% stenosis. The tibioperoneal artery was then stenotic and the posterior tibial and peroneal arteries occluded distally, but there was some collateralization in the proximal portions. The anterior tibial artery was the dominant runoff to the foot. I then put a 6-French Ansell sheath over a Terumo advantage wire and gave the patient 4000 units of intravenous heparin. I parked a wire down into the peroneal artery. I treated the tibioperoneal artery and distal popliteal artery with a 4 mm diameter angioplasty balloon with a good angiographic completion result. She still had occlusion in the peroneal and posterior tibial arteries distally. The anterior tibial artery remained patent.   The superficial femoral artery was treated and the distal superficial femoral artery with 5 mm diameter angioplasty balloon with a good angiographic completion result. The proximal superficial femoral artery lesion was treated with a 5 mm diameter angioplasty balloon with a residual dissection. I elected to treat this area with a Lutonix drug-eluting balloon. A 5 mm diameter by 10 cm  length Lutonix balloon was inflated in the proximal superficial femoral artery. Following the Lutonix placement, there was  still a small non-flow-limiting dissection present, but this was mild and I elected to terminate the procedure as her perfusion had significantly improved. The sheath was pulled back to the ipsilateral external iliac artery and oblique arteriogram was performed. StarClose closure device was deployed in the usual fashion with excellent hemostatic result. The patient tolerated the procedure well and was taken to the recovery room in stable condition   ____________________________ Algernon Huxley, MD jsd:aw D: 01/13/2013 10:08:36 ET T: 01/13/2013 10:32:26 ET JOB#: [id number]  cc: Algernon Huxley, MD, <Dictator> Algernon Huxley MD ELECTRONICALLY SIGNED 01/15/2013 9:47

## 2014-05-29 NOTE — Discharge Summary (Signed)
PATIENT NAME:  [name] [name], [name] [name] MR#:  [id number] DATE OF BIRTH:  01-24-1932  DATE OF ADMISSION:  12/11/2012 DATE OF DISCHARGE:  12/13/2012  DISCHARGE DIAGNOSES: 1. Generalized weakness secondary to dehydration. 2. Cellulitis.  3. Hypoglycemia, resolved.  4. Diabetes mellitus type 2.  5. Obesity.  6. Sleep apnea.  7. Leg cellulitis.  8. Hypertension.  9. Possible mild cognitive deficit, probably early dementia.   DISCHARGE MEDICATIONS: 1. Hydrochlorothiazide with lisinopril 25/20 mg p.o. daily.  2. Amlodipine 5 mg p.o. daily.  3. Cilostazol 100 mg p.o. daily.  4. Furosemide 20 mg p.o. daily.  5. Pravastatin 40 mg p.o. daily.  6. Ranitidine 150 mg p.o. b.i.d.  7. Plavix 75 mg p.o. daily.  8. MiraLAX as needed for constipation.  9. Dicloxacillin 100  mg p.o. 4 times a day. The patient has a prescription at home that I told her to continue and finish it off.  10. Insulin 70/30, 30 units subcutaneous b.i.d.  11. Coreg 25 mg p.o. b.i.d.   Discharged home with home physical therapy and home nurse.   CONSULTATIONS: Physical therapy consult and speech therapy consult.   The patient advised to follow up with Dr. Donn Pierini in 1 to 2 weeks for neurological assessment and also evaluate for mild dementia.   HOSPITAL COURSE: This is a 82 year old female patient admitted on 11/05 for generalized weakness, unable to get up and admitted for possible stroke. She is an obese female with hypertension and diabetes. Look at history and physical done by Dr. Leslye Peer for full details. She also has history of peripheral vascular disease, coronary artery disease,  history of transient ischemic attacks, probably mild cognitive deficit. The patient's vitals on admission showed blood pressure 174/76 with  normal  neurological exam except few changes. Also found to have leg cellulitis with elevated white count 16.2, and the patient's CT head did not show any acute changes. She had a carotid ultrasound,  echocardiogram and also MRI. She is started on aspirin. Physical therapy, speech therapy followed the patient. The patient's MRI of the brain did not show any acute changes except chronic small vessel ischemic changes which are very extensive.  Echocardiogram showed EF of 55% to 60% with normal LV function. Carotid ultrasound showed minimal plaque bilaterally. The patient did not have any stroke kind of weakness on physical exam, and other lab data white count  was up at 16.2. She was started on dicloxacillin for outpatient therapy for cellulitis. The patient received ancef while she was here in the hospital. Her white count improved to 12.3 on 11/06 and cellulitis symptoms are really improved, so told her to finish up dicloxacillin that she already has and follow up with her primary doctor. The patient has diabetes. Hemoglobin A1c was 9.8. Initially, she required only sliding scale coverage, but she has mild cognitive deficits and I am not sure how much insulin she takes,regarding  diabetes management so I referred her to  lifestyle center. The patient suspected to have cognitive change with impaired cognition and linguistic abilities with no apparent expressive aphasia.  ( pt  needs to  > to followup with neurology for a formal assessment of this cognitive deficit, and the patient also seen by physical therapy. They recommended home physical therapy and did not require any rehab placement. Physical therapy recommended home with 24-hour assistance, and discharged home in stable condition. we  Restarted her NovoLog 70/30 but dose is decreased to avoid hypoglycemia, and the patient was taking 82 units in  the morning and 52 units in the evening of NovoLog 70/30 but decreased to 30 units b.i.d. The patient initially had dehydration with BUN and creatinine on admission were BUN 24 and creatinine 1.5. She received some fluid and BUN 23 on 11/06  and creatinine 1.31.  TIME SPENT ON DISCHARGE OF THE PATIENT: More than  30  minutes.   ____________________________ Epifanio Lesches, MD sk:sg D: 12/14/2012 23:04:59 ET T: 12/15/2012 08:21:31 ET JOB#: [id number]  cc: Epifanio Lesches, MD, <Dictator> Epifanio Lesches MD ELECTRONICALLY SIGNED 12/25/2012 12:16

## 2014-06-02 DIAGNOSIS — Z794 Long term (current) use of insulin: Secondary | ICD-10-CM | POA: Diagnosis not present

## 2014-06-02 DIAGNOSIS — I10 Essential (primary) hypertension: Secondary | ICD-10-CM | POA: Diagnosis not present

## 2014-06-02 DIAGNOSIS — I2510 Atherosclerotic heart disease of native coronary artery without angina pectoris: Secondary | ICD-10-CM | POA: Diagnosis not present

## 2014-06-02 DIAGNOSIS — I6931 Cognitive deficits following cerebral infarction: Secondary | ICD-10-CM | POA: Diagnosis not present

## 2014-06-02 DIAGNOSIS — E119 Type 2 diabetes mellitus without complications: Secondary | ICD-10-CM | POA: Diagnosis not present

## 2014-06-02 DIAGNOSIS — F0150 Vascular dementia without behavioral disturbance: Secondary | ICD-10-CM | POA: Diagnosis not present

## 2014-06-09 DIAGNOSIS — F0150 Vascular dementia without behavioral disturbance: Secondary | ICD-10-CM | POA: Diagnosis not present

## 2014-06-09 DIAGNOSIS — E119 Type 2 diabetes mellitus without complications: Secondary | ICD-10-CM | POA: Diagnosis not present

## 2014-06-09 DIAGNOSIS — I10 Essential (primary) hypertension: Secondary | ICD-10-CM | POA: Diagnosis not present

## 2014-06-09 DIAGNOSIS — Z794 Long term (current) use of insulin: Secondary | ICD-10-CM | POA: Diagnosis not present

## 2014-06-09 DIAGNOSIS — I6931 Cognitive deficits following cerebral infarction: Secondary | ICD-10-CM | POA: Diagnosis not present

## 2014-06-09 DIAGNOSIS — I2510 Atherosclerotic heart disease of native coronary artery without angina pectoris: Secondary | ICD-10-CM | POA: Diagnosis not present

## 2014-07-13 ENCOUNTER — Other Ambulatory Visit: Payer: Self-pay | Admitting: Family Medicine

## 2014-07-22 ENCOUNTER — Telehealth: Payer: Self-pay | Admitting: Family Medicine

## 2014-07-22 NOTE — Telephone Encounter (Signed)
Done

## 2014-07-22 NOTE — Telephone Encounter (Addendum)
Referral request was faxed two weeks ago request and nothing has been received. Appt is scheduled 07/31/14 she was checking to see if she may needs to reschedule the pt if the needed information will not be in on time.   Contact Information: Suanne Marker [phone]

## 2014-07-23 ENCOUNTER — Other Ambulatory Visit: Payer: Self-pay | Admitting: Family Medicine

## 2014-07-26 ENCOUNTER — Other Ambulatory Visit: Payer: Self-pay | Admitting: Family Medicine

## 2014-07-28 ENCOUNTER — Other Ambulatory Visit: Payer: Self-pay | Admitting: Family Medicine

## 2014-07-28 ENCOUNTER — Other Ambulatory Visit: Payer: Self-pay

## 2014-07-28 ENCOUNTER — Encounter: Payer: Self-pay | Admitting: Family Medicine

## 2014-07-28 ENCOUNTER — Ambulatory Visit (INDEPENDENT_AMBULATORY_CARE_PROVIDER_SITE_OTHER): Payer: Commercial Managed Care - HMO | Admitting: Family Medicine

## 2014-07-28 VITALS — BP 117/63 | HR 53 | Temp 98.3°F | Resp 16 | Ht 63.5 in | Wt 206.8 lb

## 2014-07-28 DIAGNOSIS — E119 Type 2 diabetes mellitus without complications: Secondary | ICD-10-CM | POA: Diagnosis not present

## 2014-07-28 DIAGNOSIS — I10 Essential (primary) hypertension: Secondary | ICD-10-CM | POA: Diagnosis not present

## 2014-07-28 DIAGNOSIS — E118 Type 2 diabetes mellitus with unspecified complications: Secondary | ICD-10-CM

## 2014-07-28 DIAGNOSIS — E785 Hyperlipidemia, unspecified: Secondary | ICD-10-CM | POA: Diagnosis not present

## 2014-07-28 DIAGNOSIS — I6310 Cerebral infarction due to embolism of unspecified precerebral artery: Secondary | ICD-10-CM

## 2014-07-28 DIAGNOSIS — IMO0002 Reserved for concepts with insufficient information to code with codable children: Secondary | ICD-10-CM

## 2014-07-28 DIAGNOSIS — R600 Localized edema: Secondary | ICD-10-CM | POA: Diagnosis not present

## 2014-07-28 DIAGNOSIS — E1165 Type 2 diabetes mellitus with hyperglycemia: Secondary | ICD-10-CM

## 2014-07-28 MED ORDER — INSULIN PEN NEEDLE 31G X 8 MM MISC: 1.0000 | Freq: Two times a day (BID) | Status: DC

## 2014-07-28 MED ORDER — PRAVASTATIN SODIUM 40 MG PO TABS: 40.0000 mg | ORAL_TABLET | Freq: Every day | ORAL | Status: DC

## 2014-07-28 MED ORDER — INSULIN ASPART PROT & ASPART (70-30 MIX) 100 UNIT/ML ~~LOC~~ SUSP: SUBCUTANEOUS | Status: DC

## 2014-07-28 MED ORDER — INSULIN ASPART PROT & ASPART (70-30 MIX) 100 UNIT/ML PEN: 52.0000 [IU] | PEN_INJECTOR | Freq: Two times a day (BID) | SUBCUTANEOUS | Status: DC

## 2014-07-28 NOTE — Patient Instructions (Signed)
Continue other current meds. 

## 2014-07-28 NOTE — Progress Notes (Signed)
Name: [name] [name]   MRN: [id number]    DOB: 1931-10-11   Date:07/28/2014       Progress Note  Subjective  Chief Complaint  Chief Complaint  Patient presents with  . Diabetes Mellitus    BS Twice daily 190-280  . Hypertension  . Coronary Artery Disease    HPI  Here for f/u DM, HBP.  Still c/o R hand hurting.  No abnormality has been found. No other c/o.  Past Medical History  Diagnosis Date  . CAD (coronary artery disease) 2008    + nuc study led to cath and CABG  . HTN (hypertension)   . Hyperlipidemia LDL goal < 70   . CVA (cerebral infarction) 2014  . Diabetes mellitus type 2 with complications, uncontrolled     CAD, CVA  . H/O cardiovascular stress test 06/2012    Stable with lateral scar and moderate. Infarction ischemia  . OSA on CPAP 2014     patient is just now beginning CPAP  . Daytime somnolence      hoping CPAP will be able to help this  . Obesity (BMI 30.0-34.9)   . H/O gastritis   . Mallory-Weiss tear     History of  . Claudication in peripheral vascular disease 05/2012     ABI right 0.81; left 0.98     Past Surgical History  Procedure Laterality Date  . Coronary artery bypass graft  12/31/2006    CABG x4, LIMA-LAD, sequential VG-diagonal branch of LAD, OM of left circumflex & PLA branch of RCA  . Cholecystectomy      Family History  Problem Relation Age of Onset  . Stroke Mother   . Diabetes Sister   . Diabetes Brother     History   Social History  . Marital Status: Single    Spouse Name: N/A  . Number of Children: N/A  . Years of Education: N/A   Occupational History  . Not on file.   Social History Main Topics  . Smoking status: Former Research scientist (life sciences)  . Smokeless tobacco: Not on file     Comment: 50 years ago  . Alcohol Use: No  . Drug Use: No  . Sexual Activity: Not on file   Other Topics Concern  . Not on file   Social History Narrative     Current outpatient prescriptions:  .  ACCU-CHEK SOFTCLIX LANCETS lancets, CHECK BLOOD  SUGARS TWICE DAILY, Disp: 200 each, Rfl: 12 .  acetaminophen (TYLENOL) 500 MG tablet, Take 500 mg by mouth every 6 (six) hours as needed., Disp: , Rfl:  .  amLODipine (NORVASC) 5 MG tablet, Take 5 mg by mouth daily., Disp: , Rfl:  .  aspirin 81 MG tablet, Take 81 mg by mouth daily., Disp: , Rfl:  .  carvedilol (COREG) 25 MG tablet, Take 1 tablet by mouth 2 (two) times daily., Disp: , Rfl:  .  clopidogrel (PLAVIX) 75 MG tablet, Take 1 tablet by mouth daily., Disp: , Rfl:  .  furosemide (LASIX) 40 MG tablet, Take 40 mg by mouth daily., Disp: , Rfl:  .  ibuprofen (ADVIL,MOTRIN) 200 MG tablet, Take 200 mg by mouth every 6 (six) hours as needed., Disp: , Rfl:  .  insulin aspart protamine- aspart (NOVOLOG MIX 70/30) (70-30) 100 UNIT/ML injection, Take 52 units in the morning and 40 units at bedtime, subcutaneously., Disp: 5 vial, Rfl: 12 .  lisinopril (PRINIVIL,ZESTRIL) 40 MG tablet, Take 40 mg by mouth daily., Disp: , Rfl:  .  polyethylene glycol (MIRALAX / GLYCOLAX) packet, USE 1-2 PACKETS DAILY, Disp: 60 packet, Rfl: 0 .  pravastatin (PRAVACHOL) 40 MG tablet, Take 1 tablet (40 mg total) by mouth at bedtime., Disp: 30 tablet, Rfl: 3 .  ranitidine (ZANTAC) 150 MG tablet, Take 150 mg by mouth 2 (two) times daily., Disp: , Rfl:   No Known Allergies   Review of Systems  Constitutional: Negative.  Negative for fever, chills and malaise/fatigue.  HENT: Negative.   Eyes: Negative.  Negative for blurred vision and double vision.  Respiratory: Negative.  Negative for sputum production, shortness of breath and wheezing.   Cardiovascular: Negative.  Negative for chest pain, palpitations, orthopnea, claudication and leg swelling.  Gastrointestinal: Negative.  Negative for heartburn, nausea, vomiting, abdominal pain and blood in stool.  Genitourinary: Negative.  Negative for dysuria, urgency and frequency.  Musculoskeletal: Positive for joint pain (R. hand pain).  Skin: Negative.  Negative for rash.    Neurological: Negative.  Negative for dizziness, sensory change, focal weakness, weakness and headaches.     Objective  Filed Vitals:   07/28/14 1127  BP: 117/63  Pulse: 53  Temp: 98.3 F (36.8 C)  Resp: 16  Height: 5' 3.5" (1.613 m)  Weight: 206 lb 12.8 oz (93.804 kg)    Physical Exam  Constitutional: She is oriented to person, place, and time and well-developed, well-nourished, and in no distress. No distress.  HENT:  Head: Atraumatic.  Eyes: Conjunctivae are normal. Pupils are equal, round, and reactive to light. No scleral icterus.  Neck: Normal range of motion. Neck supple. No thyromegaly present.  Cardiovascular: Normal rate, regular rhythm, normal heart sounds and intact distal pulses.  Exam reveals no gallop and no friction rub.   No murmur heard. Pulmonary/Chest: Effort normal and breath sounds normal. No respiratory distress. She has no wheezes. She has no rales.  Musculoskeletal: She exhibits edema.       Right foot: There is swelling.       Left foot: There is swelling.       Feet:  Lymphadenopathy:    She has no cervical adenopathy.  Neurological: She is alert and oriented to person, place, and time.  Slowed speech and mentation  Vitals reviewed.        Assessment & Plan  Problem List Items Addressed This Visit      Cardiovascular and Mediastinum   HTN (hypertension)   Relevant Medications   amLODipine (NORVASC) 5 MG tablet   pravastatin (PRAVACHOL) 40 MG tablet     Endocrine   Diabetes mellitus type 2 with complications, uncontrolled   Relevant Medications   insulin aspart protamine- aspart (NOVOLOG MIX 70/30) (70-30) 100 UNIT/ML injection   pravastatin (PRAVACHOL) 40 MG tablet     Nervous and Auditory   CVA (cerebral infarction)     Other   Hyperlipidemia with target LDL less than 70   Relevant Medications   amLODipine (NORVASC) 5 MG tablet   pravastatin (PRAVACHOL) 40 MG tablet   Edema of both legs    Other Visit Diagnoses     Type 2 diabetes mellitus without complication    -  Primary    Relevant Medications    insulin aspart protamine- aspart (NOVOLOG MIX 70/30) (70-30) 100 UNIT/ML injection    pravastatin (PRAVACHOL) 40 MG tablet    Other Relevant Orders    POCT HgB A1C       Meds ordered this encounter  Medications  . amLODipine (NORVASC) 5 MG tablet  Sig: Take 5 mg by mouth daily.  . insulin aspart protamine- aspart (NOVOLOG MIX 70/30) (70-30) 100 UNIT/ML injection    Sig: Take 52 units in the morning and 40 units at bedtime, subcutaneously.    Dispense:  5 vial    Refill:  12  . pravastatin (PRAVACHOL) 40 MG tablet    Sig: Take 1 tablet (40 mg total) by mouth at bedtime.    Dispense:  30 tablet    Refill:  3   1. Type 2 diabetes mellitus without complication  - POCT HgB A1C - insulin aspart protamine- aspart (NOVOLOG MIX 70/30) (70-30) 100 UNIT/ML injection; Take 52 units in the morning and 40 units at bedtime, subcutaneously.  Dispense: 5 vial; Refill: 12     3. 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 Essential hypertension   5. Edema of both legs   6. Hyperlipidemia with target LDL less than 70  - pravastatin (PRAVACHOL) 40 MG tablet; Take 1 tablet (40 mg total) by mouth at bedtime.  Dispense: 30 tablet; Refill: 3

## 2014-07-31 ENCOUNTER — Ambulatory Visit (INDEPENDENT_AMBULATORY_CARE_PROVIDER_SITE_OTHER): Payer: Commercial Managed Care - HMO | Admitting: Cardiovascular Disease

## 2014-07-31 ENCOUNTER — Encounter: Payer: Self-pay | Admitting: Cardiovascular Disease

## 2014-07-31 VITALS — BP 132/60 | HR 59 | Ht 63.5 in | Wt 210.0 lb

## 2014-07-31 DIAGNOSIS — I2510 Atherosclerotic heart disease of native coronary artery without angina pectoris: Secondary | ICD-10-CM

## 2014-07-31 DIAGNOSIS — E785 Hyperlipidemia, unspecified: Secondary | ICD-10-CM

## 2014-07-31 DIAGNOSIS — I739 Peripheral vascular disease, unspecified: Secondary | ICD-10-CM

## 2014-07-31 DIAGNOSIS — I10 Essential (primary) hypertension: Secondary | ICD-10-CM | POA: Diagnosis not present

## 2014-07-31 NOTE — Progress Notes (Signed)
07/31/2014 [name] [name]   12-30-1931  [id number]  Primary Physician Dicky Doe, MD Primary Cardiologist: Lorretta Harp MD [name]   HPI:  The patient is a very pleasant, this 81 year old, moderately overweight, married [name] [name] female, mother of 85, grandmother to 4 grandchildren who I last saw 06/18/13.[name] She has a history of CAD status post coronary artery bypass grafting x5 by Dr. Gilford Raid approximately 5 years ago with a LIMA to her LAD, sequential vein to a diagonal branch and obtuse marginal branch as well as a vein to the PDA. Her other problems include hypertension, hyperlipidemia and insulin-dependent diabetes. She had a Myoview performed in 2012 that showed lateral scar with mild superimposed ischemia though she denied any chest pain or shortness of breath. She also complained of claudication and Dopplers from our office showed a right ABI of 0.81 and a left of 0.98. She had a recent Myoview performed Jun 11, 2012, that showed similar findings to her prior study with lateral scar with moderate peri-infarct ischemia. Her major complaints are of daytime somnolence. Since I saw her one year ago she is seeing Cecilie Kicks registered nurse practitioner 12/20/12. She has had a stroke in the interim and is slowly rehabilitating. She also had an endovascular procedure on her left lower extremity for what sounds like critical limb ischemia by Dr. Lucky Cowboy in [place].since I saw her a year ago she's remained stable from a heart point of view denying chest pain or shortness of breath. Her major complaint is of right hand discomfort   Current Outpatient Prescriptions  Medication Sig Dispense Refill  . ACCU-CHEK SOFTCLIX LANCETS lancets CHECK BLOOD SUGARS TWICE DAILY 200 each 12  . acetaminophen (TYLENOL) 500 MG tablet Take 500 mg by mouth every 6 (six) hours as needed.    [name] amLODipine (NORVASC) 5 MG tablet Take 5 mg by mouth daily.    [name] aspirin 81 MG tablet Take 81  mg by mouth daily.    . carvedilol (COREG) 25 MG tablet Take 1 tablet by mouth 2 (two) times daily.    . clopidogrel (PLAVIX) 75 MG tablet Take 1 tablet by mouth daily.    . furosemide (LASIX) 40 MG tablet Take 40 mg by mouth daily.    [name] ibuprofen (ADVIL,MOTRIN) 200 MG tablet Take 200 mg by mouth every 6 (six) hours as needed.    . insulin aspart protamine - aspart (NOVOLOG MIX 70/30 FLEXPEN) (70-30) 100 UNIT/ML FlexPen Inject 0.52 mLs (52 Units total) into the skin 2 (two) times daily. 52 units in morning and 40 units in evening 15 mL 11  . Insulin Pen Needle (RELION SHORT PEN NEEDLES) 31G X 8 MM MISC 1 each by Does not apply route 2 (two) times daily. 50 each 11  . lisinopril (PRINIVIL,ZESTRIL) 40 MG tablet Take 40 mg by mouth daily.    . polyethylene glycol (MIRALAX / GLYCOLAX) packet USE 1-2 PACKETS DAILY 60 packet 0  . pravastatin (PRAVACHOL) 40 MG tablet Take 1 tablet (40 mg total) by mouth at bedtime. 30 tablet 3  . ranitidine (ZANTAC) 150 MG tablet Take 150 mg by mouth 2 (two) times daily.     No current facility-administered medications for this visit.    No Known Allergies  History   Social History  . Marital Status: Single    Spouse Name: [name] [name]  . Number of Children: [name] [name]  . Years of Education: [name] [name]   Occupational History  . Not on file.  Social History Main Topics  . Smoking status: Former Research scientist (life sciences)  . Smokeless tobacco: Not on file     Comment: 50 years ago  . Alcohol Use: No  . Drug Use: No  . Sexual Activity: Not on file   Other Topics Concern  . Not on file   Social History Narrative     Review of Systems: General: negative for chills, fever, night sweats or weight changes.  Cardiovascular: negative for chest pain, dyspnea on exertion, edema, orthopnea, palpitations, paroxysmal nocturnal dyspnea or shortness of breath Dermatological: negative for rash Respiratory: negative for cough or wheezing Urologic: negative for hematuria Abdominal: negative for nausea,  vomiting, diarrhea, bright red blood per rectum, melena, or hematemesis Neurologic: negative for visual changes, syncope, or dizziness All other systems reviewed and are otherwise negative except as noted above.    Blood pressure 132/60, pulse 59, height 5' 3.5" (1.613 m), weight 210 lb (95.255 kg).  General appearance: alert and no distress Neck: no adenopathy, no carotid bruit, no JVD, supple, symmetrical, trachea midline and thyroid not enlarged, symmetric, no tenderness/mass/nodules Lungs: clear to auscultation bilaterally Heart: regular rate and rhythm, S1, S2 normal, no murmur, click, rub or gallop Extremities: extremities normal, atraumatic, no cyanosis or edema  EKG sinus bradycardia 59 with left anterior fascicular block and poor early progression. I personally reviewed this EKG  ASSESSMENT AND PLAN:   Hyperlipidemia with target LDL less than 70 History of hyperlipidemia on pravastatin 40 mg a day. We will check a lipid and liver profile  HTN (hypertension) History of hypertension blood pressure today 132/60. She is on amlodipine, carvedilol and lisinopril. Continue current meds at current dosing  Claudication in peripheral vascular disease History of peripheral vascular disease status post intervention by Dr. Lucky Cowboy in [place] on her left lower extremity.  CAD (coronary artery disease) History of CAD status post coronary artery bypass grafting by Dr. Cyndia Bent possibly 6 years ago with a LIMA to her LAD, sequential vein to diagonal branch and obtuse marginal branch as well as the PDA. Her last Myoview performed by/6/14 showed similar findings to prior studies with lateral scar and moderate peri-infarct ischemia. She denies chest pain or shortness of breath.      Lorretta Harp MD FACP,FACC,FAHA, [facility] 07/31/2014 12:25 PM

## 2014-07-31 NOTE — Assessment & Plan Note (Signed)
History of CAD status post coronary artery bypass grafting by Dr. Cyndia Bent possibly 6 years ago with a LIMA to her LAD, sequential vein to diagonal branch and obtuse marginal branch as well as the PDA. Her last Myoview performed by/6/14 showed similar findings to prior studies with lateral scar and moderate peri-infarct ischemia. She denies chest pain or shortness of breath.

## 2014-07-31 NOTE — Patient Instructions (Signed)
  We will see you back in follow up in 1 year with Dr Berry.  Dr Berry has ordered: A FASTING lipid profile: to be done at your convenience.  There is a [facility] lab on the first floor of this building, [address].  They are open from 8am-5pm with a lunch from 12-2.  You do not need an appointment.      

## 2014-07-31 NOTE — Assessment & Plan Note (Signed)
History of hypertension blood pressure today 132/60. She is on amlodipine, carvedilol and lisinopril. Continue current meds at current dosing

## 2014-07-31 NOTE — Assessment & Plan Note (Signed)
History of peripheral vascular disease status post intervention by Dr. Lucky Cowboy in [place] on her left lower extremity.

## 2014-07-31 NOTE — Assessment & Plan Note (Signed)
History of hyperlipidemia on pravastatin 40 mg a day. We will check a lipid and liver profile

## 2014-08-03 ENCOUNTER — Other Ambulatory Visit: Payer: Self-pay | Admitting: Cardiovascular Disease

## 2014-08-03 ENCOUNTER — Encounter: Payer: Self-pay | Admitting: Cardiovascular Disease

## 2014-08-03 DIAGNOSIS — Z79899 Other long term (current) drug therapy: Secondary | ICD-10-CM | POA: Diagnosis not present

## 2014-08-03 DIAGNOSIS — E785 Hyperlipidemia, unspecified: Secondary | ICD-10-CM | POA: Diagnosis not present

## 2014-08-03 DIAGNOSIS — I2510 Atherosclerotic heart disease of native coronary artery without angina pectoris: Secondary | ICD-10-CM | POA: Diagnosis not present

## 2014-08-04 LAB — LIPID PANEL WITH LDL/HDL RATIO
CHOLESTEROL TOTAL: 147 mg/dL (ref 100–199)
HDL: 37 mg/dL — AB (ref >39)
LDL Calculated: 88 mg/dL (ref 0–99)
LDl/HDL Ratio: 2.4 ratio units (ref 0.0–3.2)
Triglycerides: 110 mg/dL (ref 0–149)
VLDL Cholesterol Cal: 22 mg/dL (ref 5–40)

## 2014-08-04 LAB — HEPATIC FUNCTION PANEL
ALK PHOS: 137 IU/L — AB (ref 39–117)
ALT: 16 IU/L (ref 0–32)
AST: 19 IU/L (ref 0–40)
Albumin: 4 g/dL (ref 3.5–4.7)
Bilirubin Total: 0.4 mg/dL (ref 0.0–1.2)
TOTAL PROTEIN: 6.5 g/dL (ref 6.0–8.5)

## 2014-08-17 ENCOUNTER — Encounter: Payer: Self-pay | Admitting: Family Medicine

## 2014-08-17 ENCOUNTER — Other Ambulatory Visit: Payer: Self-pay | Admitting: Family Medicine

## 2014-08-17 ENCOUNTER — Ambulatory Visit (INDEPENDENT_AMBULATORY_CARE_PROVIDER_SITE_OTHER): Payer: Commercial Managed Care - HMO | Admitting: Family Medicine

## 2014-08-17 VITALS — BP 124/73 | HR 54 | Temp 98.2°F | Resp 16 | Ht 62.0 in | Wt 206.4 lb

## 2014-08-17 DIAGNOSIS — G8929 Other chronic pain: Secondary | ICD-10-CM | POA: Diagnosis not present

## 2014-08-17 DIAGNOSIS — M79641 Pain in right hand: Secondary | ICD-10-CM | POA: Diagnosis not present

## 2014-08-17 DIAGNOSIS — I2510 Atherosclerotic heart disease of native coronary artery without angina pectoris: Secondary | ICD-10-CM

## 2014-08-17 DIAGNOSIS — R531 Weakness: Secondary | ICD-10-CM | POA: Diagnosis not present

## 2014-08-17 DIAGNOSIS — R600 Localized edema: Secondary | ICD-10-CM | POA: Diagnosis not present

## 2014-08-17 DIAGNOSIS — I10 Essential (primary) hypertension: Secondary | ICD-10-CM

## 2014-08-17 MED ORDER — FUROSEMIDE 40 MG PO TABS: ORAL_TABLET | ORAL | Status: DC

## 2014-08-17 MED ORDER — CARVEDILOL 12.5 MG PO TABS: 12.5000 mg | ORAL_TABLET | Freq: Two times a day (BID) | ORAL | Status: DC

## 2014-08-17 NOTE — Progress Notes (Signed)
Name: [name] [name]   MRN: [id number]    DOB: 1931/08/01   Date:08/17/2014       Progress Note  Subjective  Chief Complaint  Chief Complaint  Patient presents with  . Leg Pain    swelling in legs and ankles and pt also has weakness     HPI   Here c/o bilateral leg swelling and pain and generalized weakness.  Just feels tired and run down "all the time".  Past Medical History  Diagnosis Date  . CAD (coronary artery disease) 2008    + nuc study led to cath and CABG  . HTN (hypertension)   . Hyperlipidemia LDL goal < 70   . CVA (cerebral infarction) 2014  . Diabetes mellitus type 2 with complications, uncontrolled     CAD, CVA  . H/O cardiovascular stress test 06/2012    Stable with lateral scar and moderate. Infarction ischemia  . OSA on CPAP 2014     patient is just now beginning CPAP  . Daytime somnolence      hoping CPAP will be able to help this  . Obesity (BMI 30.0-34.9)   . H/O gastritis   . Mallory-Weiss tear     History of  . Claudication in peripheral vascular disease 05/2012     ABI right 0.81; left 0.98     History  Substance Use Topics  . Smoking status: Former Research scientist (life sciences)  . Smokeless tobacco: Not on file     Comment: 50 years ago  . Alcohol Use: No     Current outpatient prescriptions:  .  ACCU-CHEK SOFTCLIX LANCETS lancets, CHECK BLOOD SUGARS TWICE DAILY, Disp: 200 each, Rfl: 12 .  acetaminophen (TYLENOL) 500 MG tablet, Take 500 mg by mouth every 6 (six) hours as needed., Disp: , Rfl:  .  amLODipine (NORVASC) 5 MG tablet, Take 5 mg by mouth daily., Disp: , Rfl:  .  aspirin 81 MG tablet, Take 81 mg by mouth daily., Disp: , Rfl:  .  clopidogrel (PLAVIX) 75 MG tablet, Take 1 tablet by mouth daily., Disp: , Rfl:  .  ibuprofen (ADVIL,MOTRIN) 200 MG tablet, Take 200 mg by mouth every 6 (six) hours as needed., Disp: , Rfl:  .  insulin aspart protamine - aspart (NOVOLOG MIX 70/30 FLEXPEN) (70-30) 100 UNIT/ML FlexPen, Inject 0.52 mLs (52 Units total) into the skin  2 (two) times daily. 52 units in morning and 40 units in evening, Disp: 15 mL, Rfl: 11 .  Insulin Pen Needle (RELION SHORT PEN NEEDLES) 31G X 8 MM MISC, 1 each by Does not apply route 2 (two) times daily., Disp: 50 each, Rfl: 11 .  lisinopril (PRINIVIL,ZESTRIL) 40 MG tablet, Take 40 mg by mouth daily., Disp: , Rfl:  .  polyethylene glycol (MIRALAX / GLYCOLAX) packet, USE 1-2 PACKETS DAILY, Disp: 60 packet, Rfl: 0 .  pravastatin (PRAVACHOL) 40 MG tablet, Take 1 tablet (40 mg total) by mouth at bedtime., Disp: 30 tablet, Rfl: 3 .  ranitidine (ZANTAC) 150 MG tablet, Take 150 mg by mouth 2 (two) times daily., Disp: , Rfl:  .  carvedilol (COREG) 12.5 MG tablet, Take 1 tablet (12.5 mg total) by mouth 2 (two) times daily with a meal., Disp: 60 tablet, Rfl: 3 .  furosemide (LASIX) 40 MG tablet, Take 2 tablets each morning for fluid, Disp: 60 tablet, Rfl: 6  No Known Allergies  Review of Systems  Constitutional: Positive for malaise/fatigue. Negative for fever and chills.  Eyes: Positive for blurred vision.  Negative for double vision.  Respiratory: Negative for cough, sputum production, shortness of breath and wheezing.   Cardiovascular: Positive for leg swelling. Negative for chest pain, palpitations, orthopnea and claudication.  Gastrointestinal: Negative for heartburn, abdominal pain and blood in stool.  Genitourinary: Negative for dysuria and frequency.  Musculoskeletal: Positive for back pain.  Skin: Negative for rash.  Neurological: Negative for headaches.      Objective  Filed Vitals:   08/17/14 1333  BP: 124/73  Pulse: 54  Temp: 98.2 F (36.8 C)  TempSrc: Oral  Resp: 16  Height: 5\' 2"  (1.575 m)  Weight: 206 lb 6.4 oz (93.622 kg)     Physical Exam  Constitutional: She is well-developed, well-nourished, and in no distress.  HENT:  Head: Normocephalic and atraumatic.  Eyes: Conjunctivae and EOM are normal. Pupils are equal, round, and reactive to light.  Neck: Normal range of  motion. Neck supple. No thyromegaly present.  Cardiovascular: Regular rhythm, normal heart sounds and intact distal pulses.  Bradycardia present.  Exam reveals no gallop and no friction rub.   No murmur heard. Pulmonary/Chest: Effort normal and breath sounds normal. No respiratory distress. She has no wheezes. She has no rales.  Abdominal: Soft. There is no tenderness.  Musculoskeletal: She exhibits no edema (1+ pedal edema bilateral feet and legs.).  Lymphadenopathy:    She has no cervical adenopathy.  Neurological: She displays weakness.  Vitals reviewed.     Recent Results (from the past 2160 hour(s))  Lipid Panel With LDL/HDL Ratio     Status: Abnormal   Collection Time: 08/03/14 10:37 AM  Result Value Ref Range   Cholesterol, Total 147 100 - 199 mg/dL   Triglycerides 110 0 - 149 mg/dL   HDL 37 (L) >39 mg/dL    Comment: According to ATP-III Guidelines, HDL-C >59 mg/dL is considered a negative risk factor for CHD.    VLDL Cholesterol Cal 22 5 - 40 mg/dL   LDL Calculated 88 0 - 99 mg/dL   LDl/HDL Ratio 2.4 0.0 - 3.2 ratio units    Comment:                                     LDL/HDL Ratio                                             Men  Women                               1/2 Avg.Risk  1.0    1.5                                   Avg.Risk  3.6    3.2                                2X Avg.Risk  6.2    5.0                                3X Avg.Risk  8.0    6.1  Hepatic function panel     Status: Abnormal   Collection Time: 08/03/14 10:37 AM  Result Value Ref Range   Total Protein 6.5 6.0 - 8.5 g/dL   Albumin 4.0 3.5 - 4.7 g/dL   Bilirubin Total 0.4 0.0 - 1.2 mg/dL   Bilirubin, Direct <0.10 0.00 - 0.40 mg/dL    Comment: **Result Repeated**   Alkaline Phosphatase 137 (H) 39 - 117 IU/L   AST 19 0 - 40 IU/L   ALT 16 0 - 32 IU/L     Assessment & Plan  -  1. Edema of both legs  - Comprehensive Metabolic Panel (CMET) - furosemide (LASIX) 40 MG tablet; Take 2 tablets  each morning for fluid  Dispense: 60 tablet; Refill: 6  2. Essential hypertension   3. Coronary artery disease involving native coronary artery of native heart without angina pectoris  - carvedilol (COREG) 12.5 MG tablet; Take 1 tablet (12.5 mg total) by mouth 2 (two) times daily with a meal.  Dispense: 60 tablet; Refill: 3  4. Generalized weakness  - Comprehensive Metabolic Panel (CMET) - CBC with Differential - TSH  5. Chronic hand pain, right  - Ambulatory referral to Orthopedic Surgery

## 2014-08-18 DIAGNOSIS — R531 Weakness: Secondary | ICD-10-CM | POA: Diagnosis not present

## 2014-08-18 DIAGNOSIS — R600 Localized edema: Secondary | ICD-10-CM | POA: Diagnosis not present

## 2014-08-18 LAB — COMPREHENSIVE METABOLIC PANEL
ALK PHOS: 125 IU/L — AB (ref 39–117)
ALT: 13 IU/L (ref 0–32)
AST: 17 IU/L (ref 0–40)
Albumin/Globulin Ratio: 1.5 (ref 1.1–2.5)
Albumin: 4 g/dL (ref 3.5–4.7)
BILIRUBIN TOTAL: 0.4 mg/dL (ref 0.0–1.2)
BUN / CREAT RATIO: 17 (ref 11–26)
BUN: 22 mg/dL (ref 8–27)
CALCIUM: 9.6 mg/dL (ref 8.7–10.3)
CO2: 26 mmol/L (ref 18–29)
Chloride: 106 mmol/L (ref 97–108)
Creatinine, Ser: 1.29 mg/dL — ABNORMAL HIGH (ref 0.57–1.00)
GFR calc Af Amer: 45 mL/min/{1.73_m2} — ABNORMAL LOW (ref >59)
GFR, EST NON AFRICAN AMERICAN: 39 mL/min/{1.73_m2} — AB (ref >59)
Globulin, Total: 2.6 g/dL (ref 1.5–4.5)
Glucose: 93 mg/dL (ref 65–99)
POTASSIUM: 4.3 mmol/L (ref 3.5–5.2)
Sodium: 143 mmol/L (ref 134–144)
TOTAL PROTEIN: 6.6 g/dL (ref 6.0–8.5)

## 2014-08-18 LAB — CBC WITH DIFFERENTIAL/PLATELET
Basophils Absolute: 0 10*3/uL (ref 0.0–0.2)
Basos: 0 %
EOS (ABSOLUTE): 0.3 10*3/uL (ref 0.0–0.4)
EOS: 3 %
HEMATOCRIT: 36.9 % (ref 34.0–46.6)
HEMOGLOBIN: 12.7 g/dL (ref 11.1–15.9)
LYMPHS: 41 %
Lymphocytes Absolute: 4.2 10*3/uL — ABNORMAL HIGH (ref 0.7–3.1)
MCH: 29.8 pg (ref 26.6–33.0)
MCHC: 34.4 g/dL (ref 31.5–35.7)
MCV: 87 fL (ref 79–97)
MONOCYTES: 6 %
MONOS ABS: 0.6 10*3/uL (ref 0.1–0.9)
NEUTROS ABS: 5 10*3/uL (ref 1.4–7.0)
Neutrophils: 50 %
Platelets: 245 10*3/uL (ref 150–379)
RBC: 4.26 x10E6/uL (ref 3.77–5.28)
RDW: 15.2 % (ref 12.3–15.4)
WBC: 10 10*3/uL (ref 3.4–10.8)

## 2014-08-20 LAB — TSH: TSH: 1.18 u[IU]/mL (ref 0.450–4.500)

## 2014-08-20 NOTE — Progress Notes (Signed)
Mailed results [facility]

## 2014-08-21 ENCOUNTER — Telehealth: Payer: Self-pay | Admitting: Family Medicine

## 2014-08-21 DIAGNOSIS — M79609 Pain in unspecified limb: Secondary | ICD-10-CM | POA: Diagnosis not present

## 2014-08-21 DIAGNOSIS — I6529 Occlusion and stenosis of unspecified carotid artery: Secondary | ICD-10-CM | POA: Diagnosis not present

## 2014-08-21 DIAGNOSIS — I739 Peripheral vascular disease, unspecified: Secondary | ICD-10-CM | POA: Diagnosis not present

## 2014-08-21 NOTE — Telephone Encounter (Signed)
Pt has appt at [facility] at 9:00 this mornin with ultra sounds scheduled.  She has [facility] and they need an authorization for her to see Dr. Clayburn Pert [phone] and is being seen for I65.29.

## 2014-08-21 NOTE — Telephone Encounter (Signed)
Approval done [id number]

## 2014-09-08 DIAGNOSIS — E13339 Other specified diabetes mellitus with moderate nonproliferative diabetic retinopathy without macular edema: Secondary | ICD-10-CM | POA: Diagnosis not present

## 2014-09-08 LAB — HM DIABETES EYE EXAM

## 2014-09-09 ENCOUNTER — Other Ambulatory Visit: Payer: Self-pay | Admitting: Family Medicine

## 2014-09-09 LAB — POCT GLYCOSYLATED HEMOGLOBIN (HGB A1C): Hemoglobin A1C: 7.6

## 2014-09-10 DIAGNOSIS — G5601 Carpal tunnel syndrome, right upper limb: Secondary | ICD-10-CM | POA: Diagnosis not present

## 2014-09-18 DIAGNOSIS — I739 Peripheral vascular disease, unspecified: Secondary | ICD-10-CM | POA: Diagnosis not present

## 2014-09-18 DIAGNOSIS — I6529 Occlusion and stenosis of unspecified carotid artery: Secondary | ICD-10-CM | POA: Diagnosis not present

## 2014-09-18 DIAGNOSIS — E119 Type 2 diabetes mellitus without complications: Secondary | ICD-10-CM | POA: Diagnosis not present

## 2014-09-18 DIAGNOSIS — I10 Essential (primary) hypertension: Secondary | ICD-10-CM | POA: Diagnosis not present

## 2014-09-18 DIAGNOSIS — M79609 Pain in unspecified limb: Secondary | ICD-10-CM | POA: Diagnosis not present

## 2014-09-29 DIAGNOSIS — G5601 Carpal tunnel syndrome, right upper limb: Secondary | ICD-10-CM | POA: Diagnosis not present

## 2014-10-15 ENCOUNTER — Telehealth: Payer: Self-pay | Admitting: Family Medicine

## 2014-10-15 ENCOUNTER — Other Ambulatory Visit: Payer: Self-pay

## 2014-10-15 DIAGNOSIS — E119 Type 2 diabetes mellitus without complications: Secondary | ICD-10-CM

## 2014-10-15 MED ORDER — INJECTION DEVICE FOR INSULIN DEVI: 1.0000 | Freq: Once | Status: DC

## 2014-10-15 MED ORDER — INSULIN PEN NEEDLE 31G X 8 MM MISC: 1.0000 | Freq: Two times a day (BID) | Status: AC

## 2014-10-15 NOTE — Telephone Encounter (Signed)
R/T call pharmacy wanted to know brand of needle. Times testing and size/gauge of needles. Nothing further needed.

## 2014-10-15 NOTE — Telephone Encounter (Signed)
Betty at [facility] in [place] has a question about one of pt's refills.  Please call [phone]

## 2014-10-16 ENCOUNTER — Other Ambulatory Visit: Payer: Self-pay | Admitting: Family Medicine

## 2014-10-19 ENCOUNTER — Other Ambulatory Visit: Payer: Self-pay | Admitting: Family Medicine

## 2014-11-03 ENCOUNTER — Encounter: Payer: Self-pay | Admitting: Family Medicine

## 2014-11-03 ENCOUNTER — Ambulatory Visit (INDEPENDENT_AMBULATORY_CARE_PROVIDER_SITE_OTHER): Payer: Commercial Managed Care - HMO | Admitting: Family Medicine

## 2014-11-03 VITALS — BP 140/60 | HR 56 | Resp 16 | Ht 62.0 in | Wt 206.0 lb

## 2014-11-03 DIAGNOSIS — I639 Cerebral infarction, unspecified: Secondary | ICD-10-CM | POA: Insufficient documentation

## 2014-11-03 DIAGNOSIS — I636 Cerebral infarction due to cerebral venous thrombosis, nonpyogenic: Secondary | ICD-10-CM | POA: Diagnosis not present

## 2014-11-03 DIAGNOSIS — L03119 Cellulitis of unspecified part of limb: Secondary | ICD-10-CM | POA: Insufficient documentation

## 2014-11-03 DIAGNOSIS — R600 Localized edema: Secondary | ICD-10-CM | POA: Insufficient documentation

## 2014-11-03 DIAGNOSIS — E119 Type 2 diabetes mellitus without complications: Secondary | ICD-10-CM | POA: Diagnosis not present

## 2014-11-03 DIAGNOSIS — Z79899 Other long term (current) drug therapy: Secondary | ICD-10-CM | POA: Insufficient documentation

## 2014-11-03 DIAGNOSIS — I10 Essential (primary) hypertension: Secondary | ICD-10-CM

## 2014-11-03 DIAGNOSIS — Z23 Encounter for immunization: Secondary | ICD-10-CM | POA: Diagnosis not present

## 2014-11-03 DIAGNOSIS — E1165 Type 2 diabetes mellitus with hyperglycemia: Secondary | ICD-10-CM

## 2014-11-03 DIAGNOSIS — I25118 Atherosclerotic heart disease of native coronary artery with other forms of angina pectoris: Secondary | ICD-10-CM

## 2014-11-03 DIAGNOSIS — K5909 Other constipation: Secondary | ICD-10-CM | POA: Insufficient documentation

## 2014-11-03 DIAGNOSIS — G4730 Sleep apnea, unspecified: Secondary | ICD-10-CM | POA: Insufficient documentation

## 2014-11-03 MED ORDER — INSULIN ASPART PROT & ASPART (70-30 MIX) 100 UNIT/ML PEN: 52.0000 [IU] | PEN_INJECTOR | Freq: Two times a day (BID) | SUBCUTANEOUS | Status: DC

## 2014-11-03 NOTE — Progress Notes (Signed)
Name: [name] [name]   MRN: [id number]    DOB: 1931-07-22   Date:11/03/2014       Progress Note  Subjective  Chief Complaint  Chief Complaint  Patient presents with  . Hypertension    declines flu shot today    HPI  Here for f/u of HBP and DM.  Still c/o R hand "not feeling right".. Has seen Ortho MD.  Other tests can be done if she desires. No problem-specific assessment & plan notes found for this encounter.   Past Medical History  Diagnosis Date  . CAD (coronary artery disease) 2008    + nuc study led to cath and CABG  . HTN (hypertension)   . Hyperlipidemia LDL goal < 70   . CVA (cerebral infarction) 2014  . Diabetes mellitus type 2 with complications, uncontrolled     CAD, CVA  . H/O cardiovascular stress test 06/2012    Stable with lateral scar and moderate. Infarction ischemia  . OSA on CPAP 2014     patient is just now beginning CPAP  . Daytime somnolence      hoping CPAP will be able to help this  . Obesity (BMI 30.0-34.9)   . H/O gastritis   . Mallory-Weiss tear     History of  . Claudication in peripheral vascular disease 05/2012     ABI right 0.81; left 0.98     Social History  Substance Use Topics  . Smoking status: Former Research scientist (life sciences)  . Smokeless tobacco: Never Used     Comment: 50 years ago  . Alcohol Use: No     Current outpatient prescriptions:  .  ACCU-CHEK SOFTCLIX LANCETS lancets, CHECK BLOOD SUGARS TWICE DAILY, Disp: 200 each, Rfl: 12 .  amLODipine (NORVASC) 5 MG tablet, TAKE 1 TABLET BY MOUTH EVERY DAY, Disp: 30 tablet, Rfl: 6 .  aspirin 81 MG tablet, Take 81 mg by mouth daily., Disp: , Rfl:  .  clopidogrel (PLAVIX) 75 MG tablet, Take 1 tablet by mouth daily., Disp: , Rfl:  .  furosemide (LASIX) 40 MG tablet, Take 2 tablets by mouth each morning., Disp: 60 tablet, Rfl: 6 .  insulin aspart protamine - aspart (NOVOLOG MIX 70/30 FLEXPEN) (70-30) 100 UNIT/ML FlexPen, Inject 0.52 mLs (52 Units total) into the skin 2 (two) times daily. 52 units in  morning and 40 units in evening, Disp: 15 mL, Rfl: 11 .  Insulin Pen Needle (RELION SHORT PEN NEEDLES) 31G X 8 MM MISC, 1 each by Does not apply route 2 (two) times daily., Disp: 50 each, Rfl: 11 .  lisinopril (PRINIVIL,ZESTRIL) 40 MG tablet, Take 40 mg by mouth daily., Disp: , Rfl:  .  polyethylene glycol (MIRALAX / GLYCOLAX) packet, USE 1-2 PACKETS DAILY, Disp: 60 packet, Rfl: 0 .  pravastatin (PRAVACHOL) 40 MG tablet, Take 1 tablet (40 mg total) by mouth at bedtime., Disp: 30 tablet, Rfl: 3 .  ranitidine (ZANTAC) 150 MG tablet, Take 150 mg by mouth 2 (two) times daily., Disp: , Rfl:  .  carvedilol (COREG) 12.5 MG tablet, Take 12.5 mg by mouth 2 (two) times daily with a meal., Disp: , Rfl:   No Known Allergies  Review of Systems  Constitutional: Negative for fever, chills, weight loss and malaise/fatigue.  HENT: Negative for hearing loss.   Eyes: Negative for blurred vision and double vision.  Respiratory: Negative for cough, sputum production, shortness of breath and wheezing.   Cardiovascular: Negative for chest pain, palpitations, orthopnea and leg swelling.  Gastrointestinal:  Negative for heartburn, abdominal pain and constipation.  Genitourinary: Negative for dysuria, urgency and frequency.  Musculoskeletal: Negative for myalgias and back pain.  Neurological: Positive for sensory change, focal weakness and weakness. Negative for dizziness and headaches.      Objective  Filed Vitals:   11/03/14 1128  BP: 122/67  Pulse: 56  Resp: 16  Height: 5\' 2"  (1.575 m)  Weight: 206 lb (93.441 kg)     Physical Exam  Constitutional: She is oriented to person, place, and time and well-developed, well-nourished, and in no distress. No distress.  HENT:  Head: Normocephalic and atraumatic.  Eyes: Conjunctivae and EOM are normal. Pupils are equal, round, and reactive to light. No scleral icterus.  Neck: Normal range of motion. Neck supple. No thyromegaly present.  Cardiovascular: Normal  rate, regular rhythm and normal heart sounds.  Exam reveals no gallop and no friction rub.   No murmur heard. Pulmonary/Chest: Effort normal and breath sounds normal. No respiratory distress. She has no wheezes. She has no rales.  Musculoskeletal: She exhibits edema (1+ edema of feet.).  Lymphadenopathy:    She has no cervical adenopathy.  Neurological: She is alert and oriented to person, place, and time. Coordination abnormal.  Walks with a cane.  Shuffles when walking.  Vitals reviewed.     Recent Results (from the past 2160 hour(s))  Comprehensive Metabolic Panel (CMET)     Status: Abnormal   Collection Time: 08/18/14 10:53 AM  Result Value Ref Range   Glucose 93 65 - 99 mg/dL   BUN 22 8 - 27 mg/dL   Creatinine, Ser 1.29 (H) 0.57 - 1.00 mg/dL   GFR calc non Af Amer 39 (L) >59 mL/min/1.73   GFR calc Af Amer 45 (L) >59 mL/min/1.73   BUN/Creatinine Ratio 17 11 - 26   Sodium 143 134 - 144 mmol/L   Potassium 4.3 3.5 - 5.2 mmol/L   Chloride 106 97 - 108 mmol/L   CO2 26 18 - 29 mmol/L   Calcium 9.6 8.7 - 10.3 mg/dL   Total Protein 6.6 6.0 - 8.5 g/dL   Albumin 4.0 3.5 - 4.7 g/dL   Globulin, Total 2.6 1.5 - 4.5 g/dL   Albumin/Globulin Ratio 1.5 1.1 - 2.5   Bilirubin Total 0.4 0.0 - 1.2 mg/dL   Alkaline Phosphatase 125 (H) 39 - 117 IU/L   AST 17 0 - 40 IU/L   ALT 13 0 - 32 IU/L  CBC with Differential     Status: Abnormal   Collection Time: 08/18/14 10:53 AM  Result Value Ref Range   WBC 10.0 3.4 - 10.8 x10E3/uL   RBC 4.26 3.77 - 5.28 x10E6/uL   Hemoglobin 12.7 11.1 - 15.9 g/dL   Hematocrit 36.9 34.0 - 46.6 %   MCV 87 79 - 97 fL   MCH 29.8 26.6 - 33.0 pg   MCHC 34.4 31.5 - 35.7 g/dL   RDW 15.2 12.3 - 15.4 %   Platelets 245 150 - 379 x10E3/uL   Neutrophils 50 %   Lymphs 41 %   Monocytes 6 %   Eos 3 %   Basos 0 %   Neutrophils Absolute 5.0 1.4 - 7.0 x10E3/uL   Lymphocytes Absolute 4.2 (H) 0.7 - 3.1 x10E3/uL   Monocytes Absolute 0.6 0.1 - 0.9 x10E3/uL   EOS (ABSOLUTE)  0.3 0.0 - 0.4 x10E3/uL   Basophils Absolute 0.0 0.0 - 0.2 x10E3/uL  TSH     Status: None   Collection Time: 08/18/14 10:53 AM  Result Value Ref Range   TSH 1.180 0.450 - 4.500 uIU/mL  POCT HgB A1C     Status: Abnormal   Collection Time: 09/09/14  3:37 PM  Result Value Ref Range   Hemoglobin A1C 7.6      Assessment & Plan  1. Type 2 diabetes mellitus without complication  - insulin aspart protamine - aspart (NOVOLOG MIX 70/30 FLEXPEN) (70-30) 100 UNIT/ML FlexPen; Inject 0.52 mLs (52 Units total) into the skin 2 (two) times daily. 50 units in morning and 40 units in evening.  Dispense: 30 mL; Refill: 11  2. Essential hypertension   3. Coronary artery disease involving native coronary artery with other forms of angina pectoris   4. Cerebral infarction due to nonpyogenic cerebral venous thrombosis   5. Type 2 diabetes mellitus with hyperglycemia   6. Need for influenza vaccination  - Flu vaccine HIGH DOSE PF (Fluzone High dose)

## 2014-11-12 ENCOUNTER — Other Ambulatory Visit: Payer: Self-pay | Admitting: Family Medicine

## 2014-11-17 ENCOUNTER — Telehealth: Payer: Self-pay | Admitting: Family Medicine

## 2014-11-17 NOTE — Telephone Encounter (Signed)
Pt called need help with her meds not sure if she was taking the correct meds need a call before  12

## 2014-11-17 NOTE — Telephone Encounter (Signed)
I did not discontinue Carvedilol.  If she has questions about her meds, she may need to have someone bring her and her meds tomorrow to office for nurse to check them against med list from last visit.  But cannot do this today.-jh

## 2014-11-17 NOTE — Telephone Encounter (Signed)
R/T patient's call and spoke with her son re: medications. Patient lost AVS and is confused b/c she has 2 bottles of some medication b/c she is on automatic refill at the pharmacy. Did you d/c Carvedilol 12.5 mg?

## 2014-11-18 NOTE — Telephone Encounter (Signed)
Error

## 2014-12-09 ENCOUNTER — Telehealth: Payer: Self-pay | Admitting: *Deleted

## 2014-12-09 ENCOUNTER — Ambulatory Visit (INDEPENDENT_AMBULATORY_CARE_PROVIDER_SITE_OTHER): Payer: Commercial Managed Care - HMO | Admitting: Family Medicine

## 2014-12-09 ENCOUNTER — Telehealth: Payer: Self-pay | Admitting: Cardiovascular Disease

## 2014-12-09 ENCOUNTER — Ambulatory Visit
Admission: RE | Admit: 2014-12-09 | Discharge: 2014-12-09 | Disposition: A | Discharge status: Home / Self Care | Payer: Commercial Managed Care - HMO | Source: Ambulatory Visit | Attending: Family Medicine | Admitting: Family Medicine

## 2014-12-09 ENCOUNTER — Encounter: Payer: Self-pay | Admitting: Family Medicine

## 2014-12-09 VITALS — BP 150/60 | HR 66 | Temp 98.0°F | Resp 16 | Ht 62.0 in | Wt 211.6 lb

## 2014-12-09 DIAGNOSIS — R600 Localized edema: Secondary | ICD-10-CM | POA: Diagnosis not present

## 2014-12-09 DIAGNOSIS — I5020 Unspecified systolic (congestive) heart failure: Secondary | ICD-10-CM

## 2014-12-09 DIAGNOSIS — Z794 Long term (current) use of insulin: Secondary | ICD-10-CM | POA: Diagnosis not present

## 2014-12-09 DIAGNOSIS — R0602 Shortness of breath: Secondary | ICD-10-CM

## 2014-12-09 DIAGNOSIS — E118 Type 2 diabetes mellitus with unspecified complications: Secondary | ICD-10-CM | POA: Diagnosis not present

## 2014-12-09 DIAGNOSIS — E1165 Type 2 diabetes mellitus with hyperglycemia: Secondary | ICD-10-CM | POA: Diagnosis not present

## 2014-12-09 DIAGNOSIS — I5032 Chronic diastolic (congestive) heart failure: Secondary | ICD-10-CM | POA: Insufficient documentation

## 2014-12-09 DIAGNOSIS — I517 Cardiomegaly: Secondary | ICD-10-CM | POA: Insufficient documentation

## 2014-12-09 DIAGNOSIS — I10 Essential (primary) hypertension: Secondary | ICD-10-CM | POA: Diagnosis not present

## 2014-12-09 DIAGNOSIS — I25118 Atherosclerotic heart disease of native coronary artery with other forms of angina pectoris: Secondary | ICD-10-CM

## 2014-12-09 DIAGNOSIS — IMO0002 Reserved for concepts with insufficient information to code with codable children: Secondary | ICD-10-CM

## 2014-12-09 DIAGNOSIS — E785 Hyperlipidemia, unspecified: Secondary | ICD-10-CM

## 2014-12-09 MED ORDER — SPIRONOLACTONE 25 MG PO TABS: 25.0000 mg | ORAL_TABLET | Freq: Every day | ORAL | Status: DC

## 2014-12-09 NOTE — Patient Instructions (Signed)
She should contact Dr. Alvester Chou, Card. for an appt. For Shortness of breath and swelling.

## 2014-12-09 NOTE — Telephone Encounter (Signed)
Patient states she was to have lab work 2 mths ago and forgot to get it done. Can she have a new order. / tg

## 2014-12-09 NOTE — Telephone Encounter (Signed)
Called and scheduled appt for patient 12/22/2014 @ 10 am.

## 2014-12-09 NOTE — Progress Notes (Signed)
Name: [name] [name]   MRN: [id number]    DOB: [birth year]   Date:12/09/2014       Progress Note  Subjective  Chief Complaint  Chief Complaint  Patient presents with  . Shortness of Breath    pt went to see cardiology and diagnosed with bradycardia on 10/16 he change some meds and she still has on and off SOB    HPI Here c/o SOB esp with activity.  No orthopnea.  Legs swelling.  BSs 90-300 ./   Last seen by Card. Several mos ago. No problem-specific assessment & plan notes found for this encounter.   Past Medical History  Diagnosis Date  . CAD (coronary artery disease) 2008    + nuc study led to cath and CABG  . HTN (hypertension)   . Hyperlipidemia LDL goal < 70   . CVA (cerebral infarction) 2014  . Diabetes mellitus type 2 with complications, uncontrolled (HCC)     CAD, CVA  . H/O cardiovascular stress test 06/2012    Stable with lateral scar and moderate. Infarction ischemia  . OSA on CPAP 2014     patient is just now beginning CPAP  . Daytime somnolence      hoping CPAP will be able to help this  . Obesity (BMI 30.0-34.9)   . H/O gastritis   . Mallory-Weiss tear     History of  . Claudication in peripheral vascular disease ([place]) 05/2012     ABI right 0.81; left 0.98     Social History  Substance Use Topics  . Smoking status: Former Research scientist (life sciences)  . Smokeless tobacco: Never Used     Comment: 50 years ago  . Alcohol Use: No     Current outpatient prescriptions:  .  ACCU-CHEK SOFTCLIX LANCETS lancets, CHECK BLOOD SUGARS TWICE DAILY, Disp: 200 each, Rfl: 12 .  amLODipine (NORVASC) 5 MG tablet, TAKE 1 TABLET BY MOUTH EVERY DAY, Disp: 30 tablet, Rfl: 6 .  aspirin 81 MG tablet, Take 81 mg by mouth daily., Disp: , Rfl:  .  carvedilol (COREG) 12.5 MG tablet, Take 25 mg by mouth 2 (two) times daily with a meal. , Disp: , Rfl:  .  clopidogrel (PLAVIX) 75 MG tablet, TAKE 1 TABLET BY MOUTH EVERY DAY, Disp: 90 tablet, Rfl: 3 .  furosemide (LASIX) 40 MG tablet, Take 2 tablets by  mouth each morning., Disp: 60 tablet, Rfl: 6 .  insulin aspart protamine - aspart (NOVOLOG MIX 70/30 FLEXPEN) (70-30) 100 UNIT/ML FlexPen, Inject 0.52 mLs (52 Units total) into the skin 2 (two) times daily. 50 units in morning and 40 units in evening., Disp: 30 mL, Rfl: 11 .  Insulin Pen Needle (RELION SHORT PEN NEEDLES) 31G X 8 MM MISC, 1 each by Does not apply route 2 (two) times daily., Disp: 50 each, Rfl: 11 .  lisinopril (PRINIVIL,ZESTRIL) 40 MG tablet, Take 40 mg by mouth daily., Disp: , Rfl:  .  polyethylene glycol (MIRALAX / GLYCOLAX) packet, USE 1-2 PACKETS DAILY, Disp: 60 packet, Rfl: 0 .  pravastatin (PRAVACHOL) 40 MG tablet, Take 1 tablet (40 mg total) by mouth at bedtime., Disp: 30 tablet, Rfl: 3 .  ranitidine (ZANTAC) 150 MG tablet, Take 150 mg by mouth 2 (two) times daily., Disp: , Rfl:   Not on File  Review of Systems  Constitutional: Positive for malaise/fatigue. Negative for fever, chills and weight loss.  Respiratory: Positive for shortness of breath and wheezing. Negative for cough and sputum production.   Cardiovascular:  Positive for leg swelling. Negative for chest pain, palpitations and orthopnea.  Gastrointestinal: Positive for heartburn and abdominal pain. Negative for blood in stool.  Genitourinary: Negative for dysuria, urgency and frequency.  Musculoskeletal:       C/o bilateral hand pain and "feeling funny"  Neurological: Negative for weakness.      Objective  Filed Vitals:   12/09/14 0917  BP: 135/68  Pulse: 56  Temp: 98 F (36.7 C)  TempSrc: Oral  Resp: 16  Height: 5\' 2"  (1.575 m)  Weight: 211 lb 9.6 oz (95.981 kg)  SpO2: 98%     Physical Exam  Constitutional: She is oriented to person, place, and time and well-developed, well-nourished, and in no distress. No distress.  HENT:  Head: Normocephalic and atraumatic.  Neck: Normal range of motion. Neck supple. Carotid bruit is not present. No thyromegaly present.  Cardiovascular: Normal rate,  regular rhythm, normal heart sounds and intact distal pulses.  Exam reveals no gallop and no friction rub.   No murmur heard. Pulmonary/Chest: Effort normal. No respiratory distress. She has decreased breath sounds in the right lower field and the left lower field. She has no wheezes. She has no rales.  Musculoskeletal: She exhibits edema (2+ pitting edema of bilateral lower legs.).  Lymphadenopathy:    She has no cervical adenopathy.  Neurological: She is alert and oriented to person, place, and time.      No results found for this or any previous visit (from the past 2160 hour(s)).   Assessment & Plan  1. SOB (shortness of breath)  - TSH - CBC with Differential - DG Chest 2 View; Future  2. Coronary artery disease involving native coronary artery with other forms of angina pectoris ([place])   3. Essential hypertension   4. Uncontrolled type 2 diabetes mellitus with complication, with long-term current use of insulin (HCC)  - Comprehensive Metabolic Panel (CMET)  5. Hyperlipidemia with target LDL less than 70  - Lipid Profile  6. Systolic congestive heart failure, unspecified congestive heart failure chronicity (HCC)  - spironolactone (ALDACTONE) 25 MG tablet; Take 1 tablet (25 mg total) by mouth daily.  Dispense: 30 tablet; Refill: 6 - Ambulatory referral to Cardiology  7. Pedal edema  - spironolactone (ALDACTONE) 25 MG tablet; Take 1 tablet (25 mg total) by mouth daily.  Dispense: 30 tablet; Refill: 6

## 2014-12-09 NOTE — Telephone Encounter (Signed)
NO ANSWER, NO ANSWER MACHINE. WILL TRY AGAIN

## 2014-12-09 NOTE — Telephone Encounter (Signed)
Last [employer] Auth good 07/27/14-01/23/15. Patient has only had 1 visit.

## 2014-12-11 DIAGNOSIS — E1165 Type 2 diabetes mellitus with hyperglycemia: Secondary | ICD-10-CM | POA: Diagnosis not present

## 2014-12-11 DIAGNOSIS — Z794 Long term (current) use of insulin: Secondary | ICD-10-CM | POA: Diagnosis not present

## 2014-12-11 DIAGNOSIS — R0602 Shortness of breath: Secondary | ICD-10-CM | POA: Diagnosis not present

## 2014-12-11 DIAGNOSIS — E785 Hyperlipidemia, unspecified: Secondary | ICD-10-CM | POA: Diagnosis not present

## 2014-12-11 DIAGNOSIS — E118 Type 2 diabetes mellitus with unspecified complications: Secondary | ICD-10-CM | POA: Diagnosis not present

## 2014-12-11 NOTE — Telephone Encounter (Signed)
Informed patient,she does not need lab slip from Dr Gwenlyn Found. She will be getting labs from pcp- Dr Cleotis Nipper VERBALIZED UNDERSTANDING

## 2014-12-14 ENCOUNTER — Other Ambulatory Visit: Payer: Self-pay | Admitting: Family Medicine

## 2014-12-14 DIAGNOSIS — N289 Disorder of kidney and ureter, unspecified: Secondary | ICD-10-CM

## 2014-12-18 LAB — CBC WITH DIFFERENTIAL/PLATELET
BASOS ABS: 0 10*3/uL (ref 0.0–0.2)
BASOS: 0 %
EOS (ABSOLUTE): 0.3 10*3/uL (ref 0.0–0.4)
EOS: 3 %
Hematocrit: 37.1 % (ref 34.0–46.6)
Hemoglobin: 12.4 g/dL (ref 11.1–15.9)
LYMPHS ABS: 4.7 10*3/uL — AB (ref 0.7–3.1)
Lymphs: 48 %
MCH: 28.7 pg (ref 26.6–33.0)
MCHC: 33.4 g/dL (ref 31.5–35.7)
MCV: 86 fL (ref 79–97)
MONOCYTES: 6 %
Monocytes Absolute: 0.6 10*3/uL (ref 0.1–0.9)
NEUTROS ABS: 4.2 10*3/uL (ref 1.4–7.0)
Neutrophils: 43 %
PLATELETS: 209 10*3/uL (ref 150–379)
RBC: 4.32 x10E6/uL (ref 3.77–5.28)
RDW: 15.4 % (ref 12.3–15.4)
WBC: 9.8 10*3/uL (ref 3.4–10.8)

## 2014-12-18 LAB — COMPREHENSIVE METABOLIC PANEL
ALK PHOS: 120 IU/L — AB (ref 39–117)
ALT: 13 IU/L (ref 0–32)
AST: 15 IU/L (ref 0–40)
Albumin/Globulin Ratio: 1.6 (ref 1.1–2.5)
Albumin: 3.9 g/dL (ref 3.5–4.7)
BILIRUBIN TOTAL: 0.4 mg/dL (ref 0.0–1.2)
BUN / CREAT RATIO: 15 (ref 11–26)
BUN: 22 mg/dL (ref 8–27)
CO2: 27 mmol/L (ref 18–29)
Calcium: 9.5 mg/dL (ref 8.7–10.3)
Chloride: 99 mmol/L (ref 97–106)
Creatinine, Ser: 1.47 mg/dL — ABNORMAL HIGH (ref 0.57–1.00)
GFR, EST AFRICAN AMERICAN: 38 mL/min/{1.73_m2} — AB (ref >59)
GFR, EST NON AFRICAN AMERICAN: 33 mL/min/{1.73_m2} — AB (ref >59)
GLUCOSE: 123 mg/dL — AB (ref 65–99)
Globulin, Total: 2.5 g/dL (ref 1.5–4.5)
Potassium: 4.4 mmol/L (ref 3.5–5.2)
Sodium: 149 mmol/L — ABNORMAL HIGH (ref 136–144)
Total Protein: 6.4 g/dL (ref 6.0–8.5)

## 2014-12-18 LAB — LIPID PANEL
CHOL/HDL RATIO: 3.8 ratio (ref 0.0–4.4)
Cholesterol, Total: 142 mg/dL (ref 100–199)
HDL: 37 mg/dL — ABNORMAL LOW (ref >39)
LDL Calculated: 86 mg/dL (ref 0–99)
Triglycerides: 95 mg/dL (ref 0–149)
VLDL Cholesterol Cal: 19 mg/dL (ref 5–40)

## 2014-12-18 LAB — TSH: TSH: 1.27 u[IU]/mL (ref 0.450–4.500)

## 2014-12-22 ENCOUNTER — Encounter: Payer: Self-pay | Admitting: Cardiovascular Disease

## 2014-12-22 ENCOUNTER — Encounter: Payer: Self-pay | Admitting: Family Medicine

## 2014-12-22 ENCOUNTER — Ambulatory Visit (INDEPENDENT_AMBULATORY_CARE_PROVIDER_SITE_OTHER): Payer: Commercial Managed Care - HMO | Admitting: Cardiovascular Disease

## 2014-12-22 VITALS — BP 132/66 | HR 68 | Ht 63.0 in | Wt 202.0 lb

## 2014-12-22 DIAGNOSIS — I639 Cerebral infarction, unspecified: Secondary | ICD-10-CM

## 2014-12-22 DIAGNOSIS — E785 Hyperlipidemia, unspecified: Secondary | ICD-10-CM

## 2014-12-22 DIAGNOSIS — I10 Essential (primary) hypertension: Secondary | ICD-10-CM

## 2014-12-22 DIAGNOSIS — I2583 Coronary atherosclerosis due to lipid rich plaque: Secondary | ICD-10-CM

## 2014-12-22 DIAGNOSIS — R0609 Other forms of dyspnea: Secondary | ICD-10-CM | POA: Diagnosis not present

## 2014-12-22 DIAGNOSIS — I2510 Atherosclerotic heart disease of native coronary artery without angina pectoris: Secondary | ICD-10-CM

## 2014-12-22 NOTE — Assessment & Plan Note (Signed)
Patient complains of increasing dyspnea on exertion over the last month or so. She has trace lower extremity edema. I'm going to obtain a 2-D echocardiogram and a pharmacologic Myoview stress test to rule out an ischemic etiology.

## 2014-12-22 NOTE — Assessment & Plan Note (Signed)
History of ischemic stroke approximately one year ago which she has slowly recurred from. She walks with a walker

## 2014-12-22 NOTE — Assessment & Plan Note (Signed)
History of hypertension blood pressure measured at 132/66. She is on amlodipine, carvedilol and lisinopril as well as spironolactone. Continue current meds at current dosing

## 2014-12-22 NOTE — Assessment & Plan Note (Signed)
History of CAD status post coronary artery bypass grafting approximately 5 years ago by Dr. Arvid Right with a LIMA to LAD, sequential vein to a diagonal branch, obtuse marginal branch as well as PDA. She's had Myoview stress test performed in 2012 2014 which showed lateral scar with moderate peri-infarct ischemia. She denies chest pain but does complain of increasing dyspnea on exertion. I'm going to repeat a 2-D echo and a from aquatic Myoview stress test.

## 2014-12-22 NOTE — Patient Instructions (Signed)
Medication Instructions:  Your physician recommends that you continue on your current medications as directed. Please refer to the Current Medication list given to you today.   Labwork: none  Testing/Procedures: Your physician has requested that you have a lexiscan myoview. For further information please visit [email or web address]. Please follow instruction sheet, as given.  Your physician has requested that you have an echocardiogram. Echocardiography is a painless test that uses sound waves to create images of your heart. It provides your doctor with information about the size and shape of your heart and how well your heart's chambers and valves are working. This procedure takes approximately one hour. There are no restrictions for this procedure.    Follow-Up:  Your physician recommends that you schedule a follow-up appointment in: 3 months with Dr. Gwenlyn Found - pending results of testing   Any Other Special Instructions Will Be Listed Below (If Applicable).     If you need a refill on your cardiac medications before your next appointment, please call your pharmacy.

## 2014-12-22 NOTE — Assessment & Plan Note (Signed)
History of hyperlipidemia on pravastatin 40 mg a day with recent lipid profile performed 12/11/14 revealing an LDL of 86 and HDL of 37.

## 2014-12-22 NOTE — Progress Notes (Signed)
12/22/2014 [name] [name]   04/28/1931  BN:[id number]  Primary Physician Dicky Doe, MD Primary Cardiologist: Lorretta Harp MD [name]   HPI:  The patient is a very pleasant, this 81 year old, moderately overweight, married [place] American female, mother of 14, grandmother to 4 grandchildren who I last saw 07/31/14.[name] She has a history of CAD status post coronary artery bypass grafting x5 by Dr. Gilford Raid approximately 5 years ago with a LIMA to her LAD, sequential vein to a diagonal branch and obtuse marginal branch as well as a vein to the PDA. Her other problems include hypertension, hyperlipidemia and insulin-dependent diabetes. She had a Myoview performed in 2012 that showed lateral scar with mild superimposed ischemia though she denied any chest pain or shortness of breath. She also complained of claudication and Dopplers from our office showed a right ABI of 0.81 and a left of 0.98. She had a recent Myoview performed Jun 11, 2012, that showed similar findings to her prior study with lateral scar with moderate peri-infarct ischemia. Her major complaints are of daytime somnolence. Since I saw her one year ago she is seeing Cecilie Kicks registered nurse practitioner 12/20/12. She has had a stroke in the interim and is slowly rehabilitating. She also had an endovascular procedure on her left lower extremity for what sounds like critical limb ischemia by Dr. Lucky Cowboy in [place].since I saw her 5 months ago her major complaints of increasing dyspnea on exertion over the last month or so. She denies chest pain.  Current Outpatient Prescriptions  Medication Sig Dispense Refill  . ACCU-CHEK SOFTCLIX LANCETS lancets CHECK BLOOD SUGARS TWICE DAILY 200 each 12  . amLODipine (NORVASC) 5 MG tablet TAKE 1 TABLET BY MOUTH EVERY DAY 30 tablet 6  . aspirin 81 MG tablet Take 81 mg by mouth daily.    . carvedilol (COREG) 12.5 MG tablet Take 25 mg by mouth 2 (two) times daily with a meal.      . clopidogrel (PLAVIX) 75 MG tablet TAKE 1 TABLET BY MOUTH EVERY DAY 90 tablet 3  . furosemide (LASIX) 40 MG tablet Take 2 tablets by mouth each morning. 60 tablet 6  . insulin aspart protamine - aspart (NOVOLOG MIX 70/30 FLEXPEN) (70-30) 100 UNIT/ML FlexPen Inject 0.52 mLs (52 Units total) into the skin 2 (two) times daily. 50 units in morning and 40 units in evening. (Patient taking differently: Inject into the skin 2 (two) times daily. 50 units in morning and 40 units in evening.) 30 mL 11  . Insulin Pen Needle (RELION SHORT PEN NEEDLES) 31G X 8 MM MISC 1 each by Does not apply route 2 (two) times daily. 50 each 11  . lisinopril (PRINIVIL,ZESTRIL) 40 MG tablet Take 40 mg by mouth daily.    . polyethylene glycol (MIRALAX / GLYCOLAX) packet USE 1-2 PACKETS DAILY 60 packet 0  . pravastatin (PRAVACHOL) 40 MG tablet Take 1 tablet (40 mg total) by mouth at bedtime. 30 tablet 3  . spironolactone (ALDACTONE) 25 MG tablet Take 1 tablet (25 mg total) by mouth daily. 30 tablet 6   No current facility-administered medications for this visit.    No Known Allergies  Social History   Social History  . Marital Status: Single    Spouse Name: N/A  . Number of Children: N/A  . Years of Education: N/A   Occupational History  . Not on file.   Social History Main Topics  . Smoking status: Former Research scientist (life sciences)  . Smokeless  tobacco: Never Used     Comment: 50 years ago  . Alcohol Use: No  . Drug Use: No  . Sexual Activity: Not on file   Other Topics Concern  . Not on file   Social History Narrative     Review of Systems: General: negative for chills, fever, night sweats or weight changes.  Cardiovascular: negative for chest pain, dyspnea on exertion, edema, orthopnea, palpitations, paroxysmal nocturnal dyspnea or shortness of breath Dermatological: negative for rash Respiratory: negative for cough or wheezing Urologic: negative for hematuria Abdominal: negative for nausea, vomiting, diarrhea,  bright red blood per rectum, melena, or hematemesis Neurologic: negative for visual changes, syncope, or dizziness All other systems reviewed and are otherwise negative except as noted above.    Blood pressure 132/66, pulse 68, height 5\' 3"  (1.6 m), weight 202 lb (91.627 kg).  General appearance: alert and no distress Neck: no adenopathy, no carotid bruit, no JVD, supple, symmetrical, trachea midline and thyroid not enlarged, symmetric, no tenderness/mass/nodules Lungs: clear to auscultation bilaterally Heart: regular rate and rhythm, S1, S2 normal, no murmur, click, rub or gallop Extremities: extremities normal, atraumatic, no cyanosis or edema  EKG not performed today  ASSESSMENT AND PLAN:   SOB (shortness of breath) Patient complains of increasing dyspnea on exertion over the last month or so. She has trace lower extremity edema. I'm going to obtain a 2-D echocardiogram and a pharmacologic Myoview stress test to rule out an ischemic etiology.  Ischemic stroke ([place]) History of ischemic stroke approximately one year ago which she has slowly recurred from. She walks with a walker  Hyperlipidemia with target LDL less than 70 History of hyperlipidemia on pravastatin 40 mg a day with recent lipid profile performed 12/11/14 revealing an LDL of 86 and HDL of 37.  HTN (hypertension) History of hypertension blood pressure measured at 132/66. She is on amlodipine, carvedilol and lisinopril as well as spironolactone. Continue current meds at current dosing  CAD (coronary artery disease) History of CAD status post coronary artery bypass grafting approximately 5 years ago by Dr. Arvid Right with a LIMA to LAD, sequential vein to a diagonal branch, obtuse marginal branch as well as PDA. She's had Myoview stress test performed in 2012 2014 which showed lateral scar with moderate peri-infarct ischemia. She denies chest pain but does complain of increasing dyspnea on exertion. I'm going to repeat a  2-D echo and a from aquatic Myoview stress test.      Lorretta Harp MD [facility], [facility] 12/22/2014 10:35 AM

## 2014-12-23 DIAGNOSIS — N179 Acute kidney failure, unspecified: Secondary | ICD-10-CM | POA: Diagnosis not present

## 2014-12-23 DIAGNOSIS — I129 Hypertensive chronic kidney disease with stage 1 through stage 4 chronic kidney disease, or unspecified chronic kidney disease: Secondary | ICD-10-CM | POA: Diagnosis not present

## 2014-12-23 DIAGNOSIS — N183 Chronic kidney disease, stage 3 (moderate): Secondary | ICD-10-CM | POA: Diagnosis not present

## 2014-12-23 DIAGNOSIS — E1129 Type 2 diabetes mellitus with other diabetic kidney complication: Secondary | ICD-10-CM | POA: Diagnosis not present

## 2014-12-23 DIAGNOSIS — I10 Essential (primary) hypertension: Secondary | ICD-10-CM | POA: Diagnosis not present

## 2014-12-23 DIAGNOSIS — R600 Localized edema: Secondary | ICD-10-CM | POA: Diagnosis not present

## 2014-12-23 DIAGNOSIS — E1122 Type 2 diabetes mellitus with diabetic chronic kidney disease: Secondary | ICD-10-CM | POA: Diagnosis not present

## 2014-12-24 ENCOUNTER — Other Ambulatory Visit: Payer: Self-pay | Admitting: Nephrology

## 2014-12-24 DIAGNOSIS — N183 Chronic kidney disease, stage 3 (moderate): Secondary | ICD-10-CM

## 2015-01-04 ENCOUNTER — Ambulatory Visit: Payer: Commercial Managed Care - HMO

## 2015-01-04 ENCOUNTER — Telehealth (HOSPITAL_COMMUNITY): Payer: Self-pay | Admitting: *Deleted

## 2015-01-04 NOTE — Telephone Encounter (Signed)
Patient given detailed instructions per Myocardial Perfusion Study Information Sheet for the test on 01/06/15 at 1000. Patient notified to arrive 15 minutes early and that it is imperative to arrive on time for appointment to keep from having the test rescheduled.  If you need to cancel or reschedule your appointment, please call the office within 24 hours of your appointment. Failure to do so may result in a cancellation of your appointment, and a $50 no show fee. Patient verbalized understanding.[name] [name], Ranae Palms

## 2015-01-06 ENCOUNTER — Ambulatory Visit (HOSPITAL_COMMUNITY): Payer: Commercial Managed Care - HMO | Attending: Cardiovascular Disease

## 2015-01-06 ENCOUNTER — Other Ambulatory Visit: Payer: Self-pay

## 2015-01-06 ENCOUNTER — Ambulatory Visit (HOSPITAL_BASED_OUTPATIENT_CLINIC_OR_DEPARTMENT_OTHER): Payer: Commercial Managed Care - HMO

## 2015-01-06 DIAGNOSIS — E669 Obesity, unspecified: Secondary | ICD-10-CM | POA: Diagnosis not present

## 2015-01-06 DIAGNOSIS — I517 Cardiomegaly: Secondary | ICD-10-CM | POA: Diagnosis not present

## 2015-01-06 DIAGNOSIS — R0600 Dyspnea, unspecified: Secondary | ICD-10-CM | POA: Insufficient documentation

## 2015-01-06 DIAGNOSIS — Z87891 Personal history of nicotine dependence: Secondary | ICD-10-CM | POA: Insufficient documentation

## 2015-01-06 DIAGNOSIS — Z6835 Body mass index (BMI) 35.0-35.9, adult: Secondary | ICD-10-CM | POA: Diagnosis not present

## 2015-01-06 DIAGNOSIS — R9439 Abnormal result of other cardiovascular function study: Secondary | ICD-10-CM | POA: Insufficient documentation

## 2015-01-06 DIAGNOSIS — I10 Essential (primary) hypertension: Secondary | ICD-10-CM | POA: Diagnosis not present

## 2015-01-06 DIAGNOSIS — Z951 Presence of aortocoronary bypass graft: Secondary | ICD-10-CM | POA: Insufficient documentation

## 2015-01-06 DIAGNOSIS — E785 Hyperlipidemia, unspecified: Secondary | ICD-10-CM | POA: Insufficient documentation

## 2015-01-06 DIAGNOSIS — R0609 Other forms of dyspnea: Secondary | ICD-10-CM | POA: Diagnosis not present

## 2015-01-06 LAB — MYOCARDIAL PERFUSION IMAGING
CHL CUP NUCLEAR SDS: 4
CHL CUP NUCLEAR SRS: 10
CHL CUP NUCLEAR SSS: 14
LHR: 0.38
LV sys vol: 27 mL
LVDIAVOL: 77 mL
Rest HR: 56 {beats}/min
TID: 0.96

## 2015-01-06 MED ORDER — TECHNETIUM TC 99M SESTAMIBI GENERIC - CARDIOLITE
30.5000 | Freq: Once | INTRAVENOUS | Status: AC | PRN
  Administered 2015-01-06: 31 via INTRAVENOUS

## 2015-01-06 MED ORDER — TECHNETIUM TC 99M SESTAMIBI GENERIC - CARDIOLITE
11.0000 | Freq: Once | INTRAVENOUS | Status: AC | PRN
  Administered 2015-01-06: 11 via INTRAVENOUS

## 2015-01-06 MED ORDER — REGADENOSON 0.4 MG/5ML IV SOLN
0.4000 mg | Freq: Once | INTRAVENOUS | Status: AC
  Administered 2015-01-06: 0.4 mg via INTRAVENOUS

## 2015-01-14 DIAGNOSIS — R600 Localized edema: Secondary | ICD-10-CM | POA: Diagnosis not present

## 2015-01-14 DIAGNOSIS — N179 Acute kidney failure, unspecified: Secondary | ICD-10-CM | POA: Diagnosis not present

## 2015-01-14 DIAGNOSIS — N183 Chronic kidney disease, stage 3 (moderate): Secondary | ICD-10-CM | POA: Diagnosis not present

## 2015-01-14 DIAGNOSIS — E1129 Type 2 diabetes mellitus with other diabetic kidney complication: Secondary | ICD-10-CM | POA: Diagnosis not present

## 2015-01-24 DIAGNOSIS — I2510 Atherosclerotic heart disease of native coronary artery without angina pectoris: Secondary | ICD-10-CM | POA: Diagnosis not present

## 2015-01-24 DIAGNOSIS — Z794 Long term (current) use of insulin: Secondary | ICD-10-CM | POA: Diagnosis not present

## 2015-01-24 DIAGNOSIS — N390 Urinary tract infection, site not specified: Secondary | ICD-10-CM | POA: Diagnosis not present

## 2015-01-24 DIAGNOSIS — Z7982 Long term (current) use of aspirin: Secondary | ICD-10-CM | POA: Diagnosis not present

## 2015-01-24 DIAGNOSIS — Z8673 Personal history of transient ischemic attack (TIA), and cerebral infarction without residual deficits: Secondary | ICD-10-CM | POA: Diagnosis not present

## 2015-01-24 DIAGNOSIS — E1165 Type 2 diabetes mellitus with hyperglycemia: Secondary | ICD-10-CM | POA: Diagnosis not present

## 2015-01-24 DIAGNOSIS — I444 Left anterior fascicular block: Secondary | ICD-10-CM | POA: Diagnosis not present

## 2015-01-24 DIAGNOSIS — Z951 Presence of aortocoronary bypass graft: Secondary | ICD-10-CM | POA: Diagnosis not present

## 2015-01-27 ENCOUNTER — Encounter: Payer: Self-pay | Admitting: Family Medicine

## 2015-01-27 ENCOUNTER — Ambulatory Visit (INDEPENDENT_AMBULATORY_CARE_PROVIDER_SITE_OTHER): Payer: Commercial Managed Care - HMO | Admitting: Family Medicine

## 2015-01-27 VITALS — BP 147/72 | HR 57 | Temp 97.4°F | Resp 16 | Ht 62.0 in | Wt 201.0 lb

## 2015-01-27 DIAGNOSIS — N390 Urinary tract infection, site not specified: Secondary | ICD-10-CM | POA: Diagnosis not present

## 2015-01-27 LAB — POCT URINALYSIS DIPSTICK
Bilirubin, UA: NEGATIVE
Glucose, UA: NEGATIVE
Ketones, UA: NEGATIVE
NITRITE UA: NEGATIVE
SPEC GRAV UA: 1.01
UROBILINOGEN UA: 0.2
pH, UA: 5

## 2015-01-27 MED ORDER — CEPHALEXIN 250 MG PO CAPS: 250.0000 mg | ORAL_CAPSULE | Freq: Four times a day (QID) | ORAL | Status: DC

## 2015-01-27 NOTE — Progress Notes (Signed)
Name: [name] [name]   MRN: [id number]    DOB: [birth year]   Date:01/27/2015       Progress Note  Subjective  Chief Complaint  Chief Complaint  Patient presents with  . Hospitalization Follow-up    UTI    HPI Here for ER f/u .  Seen 01/24/15 with UTI.  Treated with 4-5 day of Keflex.  Still feels a little weak but better than 3 days ago when she really had a "sinking spell".    No problem-specific assessment & plan notes found for this encounter.   Past Medical History  Diagnosis Date  . CAD (coronary artery disease) 2008    + nuc study led to cath and CABG  . HTN (hypertension)   . Hyperlipidemia LDL goal < 70   . CVA (cerebral infarction) 2014  . Diabetes mellitus type 2 with complications, uncontrolled (HCC)     CAD, CVA  . H/O cardiovascular stress test 06/2012    Stable with lateral scar and moderate. Infarction ischemia  . OSA on CPAP 2014     patient is just now beginning CPAP  . Daytime somnolence      hoping CPAP will be able to help this  . Obesity (BMI 30.0-34.9)   . H/O gastritis   . Mallory-Weiss tear     History of  . Claudication in peripheral vascular disease ([place]) 05/2012     ABI right 0.81; left 0.98     Past Surgical History  Procedure Laterality Date  . Coronary artery bypass graft  12/31/2006    CABG x4, LIMA-LAD, sequential VG-diagonal branch of LAD, OM of left circumflex & PLA branch of RCA  . Cholecystectomy      Family History  Problem Relation Age of Onset  . Stroke Mother   . Diabetes Sister   . Diabetes Brother     Social History   Social History  . Marital Status: Single    Spouse Name: N/A  . Number of Children: N/A  . Years of Education: N/A   Occupational History  . Not on file.   Social History Main Topics  . Smoking status: Former Research scientist (life sciences)  . Smokeless tobacco: Never Used     Comment: 50 years ago  . Alcohol Use: No  . Drug Use: No  . Sexual Activity: Not on file   Other Topics Concern  . Not on file   Social  History Narrative     Current outpatient prescriptions:  .  ACCU-CHEK SOFTCLIX LANCETS lancets, CHECK BLOOD SUGARS TWICE DAILY, Disp: 200 each, Rfl: 12 .  amLODipine (NORVASC) 5 MG tablet, TAKE 1 TABLET BY MOUTH EVERY DAY, Disp: 30 tablet, Rfl: 6 .  aspirin 81 MG tablet, Take 81 mg by mouth daily., Disp: , Rfl:  .  carvedilol (COREG) 12.5 MG tablet, Take 25 mg by mouth 2 (two) times daily with a meal. , Disp: , Rfl:  .  cephALEXin (KEFLEX) 250 MG capsule, Take 250 mg by mouth 4 (four) times daily - after meals and at bedtime., Disp: , Rfl:  .  clopidogrel (PLAVIX) 75 MG tablet, TAKE 1 TABLET BY MOUTH EVERY DAY, Disp: 90 tablet, Rfl: 3 .  furosemide (LASIX) 40 MG tablet, Take 2 tablets by mouth each morning., Disp: 60 tablet, Rfl: 6 .  insulin aspart protamine - aspart (NOVOLOG MIX 70/30 FLEXPEN) (70-30) 100 UNIT/ML FlexPen, Inject 0.52 mLs (52 Units total) into the skin 2 (two) times daily. 50 units in morning and 40 units  in evening. (Patient taking differently: Inject into the skin 2 (two) times daily. 50 units in morning and 40 units in evening.), Disp: 30 mL, Rfl: 11 .  Insulin Pen Needle (RELION SHORT PEN NEEDLES) 31G X 8 MM MISC, 1 each by Does not apply route 2 (two) times daily., Disp: 50 each, Rfl: 11 .  lisinopril (PRINIVIL,ZESTRIL) 40 MG tablet, Take 40 mg by mouth daily., Disp: , Rfl:  .  polyethylene glycol (MIRALAX / GLYCOLAX) packet, USE 1-2 PACKETS DAILY, Disp: 60 packet, Rfl: 0 .  pravastatin (PRAVACHOL) 40 MG tablet, Take 1 tablet (40 mg total) by mouth at bedtime., Disp: 30 tablet, Rfl: 3 .  spironolactone (ALDACTONE) 25 MG tablet, Take 1 tablet (25 mg total) by mouth daily., Disp: 30 tablet, Rfl: 6 .  cephALEXin (KEFLEX) 250 MG capsule, Take 1 capsule (250 mg total) by mouth 4 (four) times daily., Disp: 8 capsule, Rfl: 0  No Known Allergies   Review of Systems  Constitutional: Positive for malaise/fatigue. Negative for fever, chills and weight loss.  HENT: Negative for  hearing loss.   Eyes: Negative for blurred vision and double vision.  Respiratory: Negative for cough, shortness of breath and wheezing.   Cardiovascular: Negative for chest pain, palpitations and leg swelling.  Gastrointestinal: Negative for heartburn, abdominal pain and blood in stool.  Genitourinary: Negative for urgency and frequency.  Musculoskeletal: Negative for myalgias and back pain.  Skin: Negative for rash.  Neurological: Positive for dizziness and weakness. Negative for tremors and headaches.      Objective  Filed Vitals:   01/27/15 1315  BP: 147/72  Pulse: 57  Temp: 97.4 F (36.3 C)  TempSrc: Oral  Resp: 16  Height: 5\' 2"  (1.575 m)  Weight: 201 lb (91.173 kg)    Physical Exam  Constitutional: She is oriented to person, place, and time and well-developed, well-nourished, and in no distress. No distress.  HENT:  Head: Normocephalic and atraumatic.  Neck: Normal range of motion. Neck supple. Carotid bruit is not present. No thyromegaly present.  Cardiovascular: Normal rate, regular rhythm and normal heart sounds.  Exam reveals no gallop and no friction rub.   No murmur heard. Pulmonary/Chest: Effort normal and breath sounds normal. No respiratory distress. She has no wheezes. She has no rales.  Abdominal: Soft. Bowel sounds are normal. She exhibits no distension and no mass. There is no tenderness.  No CVA tenderness.  Lymphadenopathy:    She has no cervical adenopathy.  Neurological: She is alert and oriented to person, place, and time.  Vitals reviewed.      Recent Results (from the past 2160 hour(s))  Comprehensive Metabolic Panel (CMET)     Status: Abnormal   Collection Time: 12/11/14 10:05 AM  Result Value Ref Range   Glucose 123 (H) 65 - 99 mg/dL   BUN 22 8 - 27 mg/dL   Creatinine, Ser 1.47 (H) 0.57 - 1.00 mg/dL   GFR calc non Af Amer 33 (L) >59 mL/min/1.73   GFR calc Af Amer 38 (L) >59 mL/min/1.73   BUN/Creatinine Ratio 15 11 - 26   Sodium 149  (H) 136 - 144 mmol/L   Potassium 4.4 3.5 - 5.2 mmol/L   Chloride 99 97 - 106 mmol/L   CO2 27 18 - 29 mmol/L   Calcium 9.5 8.7 - 10.3 mg/dL   Total Protein 6.4 6.0 - 8.5 g/dL   Albumin 3.9 3.5 - 4.7 g/dL   Globulin, Total 2.5 1.5 - 4.5 g/dL  Albumin/Globulin Ratio 1.6 1.1 - 2.5   Bilirubin Total 0.4 0.0 - 1.2 mg/dL   Alkaline Phosphatase 120 (H) 39 - 117 IU/L   AST 15 0 - 40 IU/L   ALT 13 0 - 32 IU/L  Lipid Profile     Status: Abnormal   Collection Time: 12/11/14 10:05 AM  Result Value Ref Range   Cholesterol, Total 142 100 - 199 mg/dL   Triglycerides 95 0 - 149 mg/dL   HDL 37 (L) >39 mg/dL    Comment: According to ATP-III Guidelines, HDL-C >59 mg/dL is considered a negative risk factor for CHD.    VLDL Cholesterol Cal 19 5 - 40 mg/dL   LDL Calculated 86 0 - 99 mg/dL   Chol/HDL Ratio 3.8 0.0 - 4.4 ratio units    Comment:                                   T. Chol/HDL Ratio                                             Men  Women                               1/2 Avg.Risk  3.4    3.3                                   Avg.Risk  5.0    4.4                                2X Avg.Risk  9.6    7.1                                3X Avg.Risk 23.4   11.0   TSH     Status: None   Collection Time: 12/11/14 10:05 AM  Result Value Ref Range   TSH 1.270 0.450 - 4.500 uIU/mL  CBC with Differential     Status: Abnormal   Collection Time: 12/11/14 10:05 AM  Result Value Ref Range   WBC 9.8 3.4 - 10.8 x10E3/uL   RBC 4.32 3.77 - 5.28 x10E6/uL   Hemoglobin 12.4 11.1 - 15.9 g/dL   Hematocrit 37.1 34.0 - 46.6 %   MCV 86 79 - 97 fL   MCH 28.7 26.6 - 33.0 pg   MCHC 33.4 31.5 - 35.7 g/dL   RDW 15.4 12.3 - 15.4 %   Platelets 209 150 - 379 x10E3/uL   Neutrophils 43 %   Lymphs 48 %   Monocytes 6 %   Eos 3 %   Basos 0 %   Neutrophils Absolute 4.2 1.4 - 7.0 x10E3/uL   Lymphocytes Absolute 4.7 (H) 0.7 - 3.1 x10E3/uL   Monocytes Absolute 0.6 0.1 - 0.9 x10E3/uL   EOS (ABSOLUTE) 0.3 0.0 - 0.4  x10E3/uL   Basophils Absolute 0.0 0.0 - 0.2 x10E3/uL  Myocardial Perfusion Imaging     Status: None   Collection Time: 01/06/15  2:49 PM  Result Value Ref Range   Rest HR  56 bpm   Rest BP 111/75 mmHg   Exercise duration (min)  min   Exercise duration (sec)  sec   Estimated workload  METS   Peak HR  bpm   Peak BP  mmHg   MPHR  bpm   Percent HR  %   RPE     LV Systolic Volume 27 mL   TID 123456    LV Diastolic Volume 77 mL   LHR 0.38    SSS 14    SRS 10    SDS 4   POCT Urinalysis Dipstick     Status: Abnormal   Collection Time: 01/27/15  1:20 PM  Result Value Ref Range   Color, UA yellow    Clarity, UA clear    Glucose, UA negative    Bilirubin, UA negative    Ketones, UA negative    Spec Grav, UA 1.010    Blood, UA trace    pH, UA 5.0    Protein, UA neagtive    Urobilinogen, UA 0.2    Nitrite, UA negative    Leukocytes, UA Trace (A) Negative     Assessment & Plan  Problem List Items Addressed This Visit      Genitourinary   UTI (lower urinary tract infection) - Primary   Relevant Medications   cephALEXin (KEFLEX) 250 MG capsule   cephALEXin (KEFLEX) 250 MG capsule   Other Relevant Orders   POCT Urinalysis Dipstick (Completed)      Meds ordered this encounter  Medications  . cephALEXin (KEFLEX) 250 MG capsule    Sig: Take 250 mg by mouth 4 (four) times daily - after meals and at bedtime.  . cephALEXin (KEFLEX) 250 MG capsule    Sig: Take 1 capsule (250 mg total) by mouth 4 (four) times daily.    Dispense:  8 capsule    Refill:  0   1. UTI (lower urinary tract infection)  - POCT Urinalysis Dipstick-trace leukocytes and blood.  - cephALEXin (KEFLEX) 250 MG capsule; Take 1 capsule (250 mg total) by mouth 4 (four) times daily.  Dispense: 8 capsule; Refill: 0

## 2015-01-28 ENCOUNTER — Other Ambulatory Visit: Payer: Self-pay | Admitting: Family Medicine

## 2015-02-16 ENCOUNTER — Ambulatory Visit: Payer: Commercial Managed Care - HMO | Admitting: Family Medicine

## 2015-02-23 ENCOUNTER — Ambulatory Visit (INDEPENDENT_AMBULATORY_CARE_PROVIDER_SITE_OTHER): Payer: Commercial Managed Care - HMO | Admitting: Family Medicine

## 2015-02-23 ENCOUNTER — Encounter: Payer: Self-pay | Admitting: Family Medicine

## 2015-02-23 VITALS — BP 108/64 | HR 56 | Resp 16 | Ht 64.0 in | Wt 200.0 lb

## 2015-02-23 DIAGNOSIS — E119 Type 2 diabetes mellitus without complications: Secondary | ICD-10-CM

## 2015-02-23 DIAGNOSIS — I5020 Unspecified systolic (congestive) heart failure: Secondary | ICD-10-CM | POA: Diagnosis not present

## 2015-02-23 DIAGNOSIS — R600 Localized edema: Secondary | ICD-10-CM | POA: Diagnosis not present

## 2015-02-23 DIAGNOSIS — Z794 Long term (current) use of insulin: Secondary | ICD-10-CM | POA: Diagnosis not present

## 2015-02-23 DIAGNOSIS — I10 Essential (primary) hypertension: Secondary | ICD-10-CM

## 2015-02-23 DIAGNOSIS — E785 Hyperlipidemia, unspecified: Secondary | ICD-10-CM | POA: Diagnosis not present

## 2015-02-23 LAB — POCT GLYCOSYLATED HEMOGLOBIN (HGB A1C): Hemoglobin A1C: 8.2

## 2015-02-23 MED ORDER — INSULIN ASPART PROT & ASPART (70-30 MIX) 100 UNIT/ML PEN: 40.0000 [IU] | PEN_INJECTOR | Freq: Two times a day (BID) | SUBCUTANEOUS | Status: DC

## 2015-02-23 NOTE — Patient Instructions (Addendum)
Needs to eat less and lose weight.  Keep Nephrology appt.

## 2015-02-23 NOTE — Progress Notes (Signed)
Name: [name] [name]   MRN: [id number]    DOB: [birth year]   Date:02/23/2015       Progress Note  Subjective  Chief Complaint  Chief Complaint  Patient presents with  . Diabetes    A1C 09/2014   7.6 today 8.2    HPI Here for f/u of DM and HBP.  BS ranginf from 130 to 400 with most > 200.    C/o her L foot just not feeling right.  Can't  Be more specific.  Requests diab etic shoes, but has not qualified in past.  No problem-specific assessment & plan notes found for this encounter.   Past Medical History  Diagnosis Date  . CAD (coronary artery disease) 2008    + nuc study led to cath and CABG  . HTN (hypertension)   . Hyperlipidemia LDL goal < 70   . CVA (cerebral infarction) 2014  . Diabetes mellitus type 2 with complications, uncontrolled (HCC)     CAD, CVA  . H/O cardiovascular stress test 06/2012    Stable with lateral scar and moderate. Infarction ischemia  . OSA on CPAP 2014     patient is just now beginning CPAP  . Daytime somnolence      hoping CPAP will be able to help this  . Obesity (BMI 30.0-34.9)   . H/O gastritis   . Mallory-Weiss tear     History of  . Claudication in peripheral vascular disease ([place]) 05/2012     ABI right 0.81; left 0.98     Past Surgical History  Procedure Laterality Date  . Coronary artery bypass graft  12/31/2006    CABG x4, LIMA-LAD, sequential VG-diagonal branch of LAD, OM of left circumflex & PLA branch of RCA  . Cholecystectomy      Family History  Problem Relation Age of Onset  . Stroke Mother   . Diabetes Sister   . Diabetes Brother     Social History   Social History  . Marital Status: Single    Spouse Name: N/A  . Number of Children: N/A  . Years of Education: N/A   Occupational History  . Not on file.   Social History Main Topics  . Smoking status: Former Research scientist (life sciences)  . Smokeless tobacco: Never Used     Comment: 50 years ago  . Alcohol Use: No  . Drug Use: No  . Sexual Activity: Not on file   Other Topics  Concern  . Not on file   Social History Narrative     Current outpatient prescriptions:  .  ACCU-CHEK SOFTCLIX LANCETS lancets, CHECK BLOOD SUGARS TWICE DAILY, Disp: 200 each, Rfl: 12 .  amLODipine (NORVASC) 5 MG tablet, TAKE 1 TABLET BY MOUTH EVERY DAY, Disp: 30 tablet, Rfl: 6 .  aspirin 81 MG tablet, Take 81 mg by mouth daily., Disp: , Rfl:  .  carvedilol (COREG) 12.5 MG tablet, Take 25 mg by mouth 2 (two) times daily with a meal. , Disp: , Rfl:  .  clopidogrel (PLAVIX) 75 MG tablet, TAKE 1 TABLET BY MOUTH EVERY DAY, Disp: 90 tablet, Rfl: 3 .  furosemide (LASIX) 40 MG tablet, Take 2 tablets by mouth each morning., Disp: 60 tablet, Rfl: 6 .  insulin aspart protamine - aspart (NOVOLOG MIX 70/30 FLEXPEN) (70-30) 100 UNIT/ML FlexPen, Inject 0.4-0.54 mLs (40-54 Units total) into the skin 2 (two) times daily. 54 units in morning and 40 units in evening., Disp: 30 mL, Rfl: 11 .  Insulin Pen Needle (RELION  SHORT PEN NEEDLES) 31G X 8 MM MISC, 1 each by Does not apply route 2 (two) times daily., Disp: 50 each, Rfl: 11 .  lisinopril (PRINIVIL,ZESTRIL) 40 MG tablet, TAKE 1 TABLET DAILY, Disp: 30 tablet, Rfl: 3 .  polyethylene glycol (MIRALAX / GLYCOLAX) packet, USE 1-2 PACKETS DAILY, Disp: 60 packet, Rfl: 0 .  pravastatin (PRAVACHOL) 40 MG tablet, Take 1 tablet (40 mg total) by mouth at bedtime., Disp: 30 tablet, Rfl: 3 .  spironolactone (ALDACTONE) 25 MG tablet, Take 1 tablet (25 mg total) by mouth daily., Disp: 30 tablet, Rfl: 6  No Known Allergies   Review of Systems  Constitutional: Positive for malaise/fatigue. Negative for fever, chills and weight loss.  HENT: Negative for hearing loss.   Eyes: Negative for blurred vision and double vision.  Respiratory: Negative for cough, shortness of breath and wheezing.   Cardiovascular: Negative for chest pain, palpitations and leg swelling.  Gastrointestinal: Negative for heartburn, abdominal pain and blood in stool.  Genitourinary: Negative for  dysuria, urgency and frequency.  Musculoskeletal:       L foot "feels funny"  Skin: Negative for rash.  Neurological: Positive for weakness. Negative for dizziness, tremors and headaches.      Objective  Filed Vitals:   02/23/15 1100  BP: 108/64  Pulse: 56  Resp: 16  Height: 5\' 4"  (1.626 m)  Weight: 200 lb (90.719 kg)    Physical Exam  Constitutional: She is well-developed, well-nourished, and in no distress. No distress.  HENT:  Head: Normocephalic and atraumatic.  Eyes: Conjunctivae are normal. Pupils are equal, round, and reactive to light. No scleral icterus.  Neck: Normal range of motion. Neck supple. Carotid bruit is not present. No thyromegaly present.  Cardiovascular: Regular rhythm and normal heart sounds.  Exam reveals no gallop and no friction rub.   No murmur heard. Pulmonary/Chest: Effort normal and breath sounds normal. No respiratory distress. She has no wheezes. She has no rales.  Abdominal: Soft.  Musculoskeletal: She exhibits edema (trace bilateral pedal edema.).  L foot with dry skin scale and some mild callous formation of 5th toe.  Not infected.  R foot with dry skin scale.    Lymphadenopathy:    She has no cervical adenopathy.  Vitals reviewed.      Recent Results (from the past 2160 hour(s))  Comprehensive Metabolic Panel (CMET)     Status: Abnormal   Collection Time: 12/11/14 10:05 AM  Result Value Ref Range   Glucose 123 (H) 65 - 99 mg/dL   BUN 22 8 - 27 mg/dL   Creatinine, Ser 1.47 (H) 0.57 - 1.00 mg/dL   GFR calc non Af Amer 33 (L) >59 mL/min/1.73   GFR calc Af Amer 38 (L) >59 mL/min/1.73   BUN/Creatinine Ratio 15 11 - 26   Sodium 149 (H) 136 - 144 mmol/L   Potassium 4.4 3.5 - 5.2 mmol/L   Chloride 99 97 - 106 mmol/L   CO2 27 18 - 29 mmol/L   Calcium 9.5 8.7 - 10.3 mg/dL   Total Protein 6.4 6.0 - 8.5 g/dL   Albumin 3.9 3.5 - 4.7 g/dL   Globulin, Total 2.5 1.5 - 4.5 g/dL   Albumin/Globulin Ratio 1.6 1.1 - 2.5   Bilirubin Total 0.4  0.0 - 1.2 mg/dL   Alkaline Phosphatase 120 (H) 39 - 117 IU/L   AST 15 0 - 40 IU/L   ALT 13 0 - 32 IU/L  Lipid Profile     Status: Abnormal  Collection Time: 12/11/14 10:05 AM  Result Value Ref Range   Cholesterol, Total 142 100 - 199 mg/dL   Triglycerides 95 0 - 149 mg/dL   HDL 37 (L) >39 mg/dL    Comment: According to ATP-III Guidelines, HDL-C >59 mg/dL is considered a negative risk factor for CHD.    VLDL Cholesterol Cal 19 5 - 40 mg/dL   LDL Calculated 86 0 - 99 mg/dL   Chol/HDL Ratio 3.8 0.0 - 4.4 ratio units    Comment:                                   T. Chol/HDL Ratio                                             Men  Women                               1/2 Avg.Risk  3.4    3.3                                   Avg.Risk  5.0    4.4                                2X Avg.Risk  9.6    7.1                                3X Avg.Risk 23.4   11.0   TSH     Status: None   Collection Time: 12/11/14 10:05 AM  Result Value Ref Range   TSH 1.270 0.450 - 4.500 uIU/mL  CBC with Differential     Status: Abnormal   Collection Time: 12/11/14 10:05 AM  Result Value Ref Range   WBC 9.8 3.4 - 10.8 x10E3/uL   RBC 4.32 3.77 - 5.28 x10E6/uL   Hemoglobin 12.4 11.1 - 15.9 g/dL   Hematocrit 37.1 34.0 - 46.6 %   MCV 86 79 - 97 fL   MCH 28.7 26.6 - 33.0 pg   MCHC 33.4 31.5 - 35.7 g/dL   RDW 15.4 12.3 - 15.4 %   Platelets 209 150 - 379 x10E3/uL   Neutrophils 43 %   Lymphs 48 %   Monocytes 6 %   Eos 3 %   Basos 0 %   Neutrophils Absolute 4.2 1.4 - 7.0 x10E3/uL   Lymphocytes Absolute 4.7 (H) 0.7 - 3.1 x10E3/uL   Monocytes Absolute 0.6 0.1 - 0.9 x10E3/uL   EOS (ABSOLUTE) 0.3 0.0 - 0.4 x10E3/uL   Basophils Absolute 0.0 0.0 - 0.2 x10E3/uL  Myocardial Perfusion Imaging     Status: None   Collection Time: 01/06/15  2:49 PM  Result Value Ref Range   Rest HR 56 bpm   Rest BP 111/75 mmHg   Exercise duration (min)  min   Exercise duration (sec)  sec   Estimated workload  METS   Peak HR  bpm    Peak BP  mmHg   MPHR  bpm   Percent HR  %  RPE     LV Systolic Volume 27 mL   TID 123456    LV Diastolic Volume 77 mL   LHR 0.38    SSS 14    SRS 10    SDS 4   POCT Urinalysis Dipstick     Status: Abnormal   Collection Time: 01/27/15  1:20 PM  Result Value Ref Range   Color, UA yellow    Clarity, UA clear    Glucose, UA negative    Bilirubin, UA negative    Ketones, UA negative    Spec Grav, UA 1.010    Blood, UA trace    pH, UA 5.0    Protein, UA neagtive    Urobilinogen, UA 0.2    Nitrite, UA negative    Leukocytes, UA Trace (A) Negative  POCT HgB A1C     Status: Abnormal   Collection Time: 02/23/15 11:17 AM  Result Value Ref Range   Hemoglobin A1C 8.2      Assessment & Plan  Problem List Items Addressed This Visit      Cardiovascular and Mediastinum   HTN (hypertension)   CHF (congestive heart failure) (HCC)     Endocrine   Type 2 diabetes mellitus without complication (HCC) - Primary   Relevant Medications   insulin aspart protamine - aspart (NOVOLOG MIX 70/30 FLEXPEN) (70-30) 100 UNIT/ML FlexPen   Other Relevant Orders   POCT HgB A1C (Completed)     Other   Hyperlipidemia with target LDL less than 70   Edema of both legs      Meds ordered this encounter  Medications  . insulin aspart protamine - aspart (NOVOLOG MIX 70/30 FLEXPEN) (70-30) 100 UNIT/ML FlexPen    Sig: Inject 0.4-0.54 mLs (40-54 Units total) into the skin 2 (two) times daily. 54 units in morning and 40 units in evening.    Dispense:  30 mL    Refill:  11   1. Type 2 diabetes mellitus without complication, with long-term current use of insulin (HCC) Cont. meds - POCT HgB A1C-8.2 - insulin aspart protamine - aspart (NOVOLOG MIX 70/30 FLEXPEN) (70-30) 100 UNIT/ML FlexPen; Inject 0.4-0.54 mLs (40-54 Units total) into the skin 2 (two) times daily. 54 units in morning and 40 units in evening.  Dispense: 30 mL; Refill: 11  2. Essential hypertension  Cont. meds 3. Systolic congestive  heart failure, unspecified congestive heart failure chronicity ([place])   4. Hyperlipidemia with target LDL less than 70 Cont. meds  5. Edema of both legs Cont. meds  Form filled out and signed to request Diabetic shoes.

## 2015-02-26 ENCOUNTER — Telehealth: Payer: Self-pay | Admitting: *Deleted

## 2015-02-26 NOTE — Telephone Encounter (Signed)
Ins. Provider called to inform that company for diabetic shoe that was completed is out of network. Patient is in network with [facility].

## 2015-03-01 NOTE — Telephone Encounter (Signed)
If patient finds provider in network, will fill out form again.  Insurance Co. Can provide her that information.-jh

## 2015-03-02 ENCOUNTER — Telehealth: Payer: Self-pay | Admitting: *Deleted

## 2015-03-02 NOTE — Telephone Encounter (Signed)
Called and spoke to [place] Re: dm shoes. She faxed form for completion to be sent back for authorization. Attached last ov note and [employer]back form. Awaiting decision for dm shoes.

## 2015-03-05 ENCOUNTER — Other Ambulatory Visit: Payer: Commercial Managed Care - HMO | Admitting: Family Medicine

## 2015-03-05 ENCOUNTER — Other Ambulatory Visit: Payer: Self-pay | Admitting: *Deleted

## 2015-03-05 ENCOUNTER — Telehealth: Payer: Self-pay | Admitting: *Deleted

## 2015-03-05 DIAGNOSIS — R600 Localized edema: Secondary | ICD-10-CM

## 2015-03-05 DIAGNOSIS — N183 Chronic kidney disease, stage 3 (moderate): Secondary | ICD-10-CM

## 2015-03-05 DIAGNOSIS — I739 Peripheral vascular disease, unspecified: Secondary | ICD-10-CM

## 2015-03-05 DIAGNOSIS — E1165 Type 2 diabetes mellitus with hyperglycemia: Secondary | ICD-10-CM

## 2015-03-05 DIAGNOSIS — E118 Type 2 diabetes mellitus with unspecified complications: Principal | ICD-10-CM

## 2015-03-05 DIAGNOSIS — IMO0002 Reserved for concepts with insufficient information to code with codable children: Secondary | ICD-10-CM

## 2015-03-05 DIAGNOSIS — Z794 Long term (current) use of insulin: Principal | ICD-10-CM

## 2015-03-05 NOTE — Telephone Encounter (Signed)
2 orders have been submitted for DM shoes and both denied. A [employer] PA was denied as welll. Per Anderson [place] patient is in network with [employer]. An order has been entered and waiting MD signature. Order will then be faxed to [facility].

## 2015-03-10 ENCOUNTER — Other Ambulatory Visit: Payer: Self-pay | Admitting: Family Medicine

## 2015-03-17 DIAGNOSIS — N183 Chronic kidney disease, stage 3 (moderate): Secondary | ICD-10-CM | POA: Diagnosis not present

## 2015-03-22 DIAGNOSIS — N183 Chronic kidney disease, stage 3 (moderate): Secondary | ICD-10-CM | POA: Diagnosis not present

## 2015-03-22 DIAGNOSIS — E1129 Type 2 diabetes mellitus with other diabetic kidney complication: Secondary | ICD-10-CM | POA: Diagnosis not present

## 2015-03-22 DIAGNOSIS — R600 Localized edema: Secondary | ICD-10-CM | POA: Diagnosis not present

## 2015-03-22 DIAGNOSIS — I10 Essential (primary) hypertension: Secondary | ICD-10-CM | POA: Diagnosis not present

## 2015-03-24 ENCOUNTER — Ambulatory Visit: Payer: Commercial Managed Care - HMO | Admitting: Cardiovascular Disease

## 2015-03-28 DIAGNOSIS — R001 Bradycardia, unspecified: Secondary | ICD-10-CM | POA: Diagnosis not present

## 2015-03-28 DIAGNOSIS — Z8673 Personal history of transient ischemic attack (TIA), and cerebral infarction without residual deficits: Secondary | ICD-10-CM | POA: Diagnosis not present

## 2015-03-28 DIAGNOSIS — R2681 Unsteadiness on feet: Secondary | ICD-10-CM | POA: Diagnosis not present

## 2015-03-28 DIAGNOSIS — I69954 Hemiplegia and hemiparesis following unspecified cerebrovascular disease affecting left non-dominant side: Secondary | ICD-10-CM | POA: Diagnosis not present

## 2015-03-28 DIAGNOSIS — Z794 Long term (current) use of insulin: Secondary | ICD-10-CM | POA: Diagnosis not present

## 2015-03-28 DIAGNOSIS — Z7902 Long term (current) use of antithrombotics/antiplatelets: Secondary | ICD-10-CM | POA: Diagnosis not present

## 2015-03-28 DIAGNOSIS — I509 Heart failure, unspecified: Secondary | ICD-10-CM | POA: Diagnosis not present

## 2015-03-28 DIAGNOSIS — I10 Essential (primary) hypertension: Secondary | ICD-10-CM | POA: Insufficient documentation

## 2015-03-28 DIAGNOSIS — I2510 Atherosclerotic heart disease of native coronary artery without angina pectoris: Secondary | ICD-10-CM | POA: Insufficient documentation

## 2015-03-28 DIAGNOSIS — E119 Type 2 diabetes mellitus without complications: Secondary | ICD-10-CM | POA: Diagnosis not present

## 2015-03-28 DIAGNOSIS — N189 Chronic kidney disease, unspecified: Secondary | ICD-10-CM | POA: Diagnosis not present

## 2015-03-28 DIAGNOSIS — E11319 Type 2 diabetes mellitus with unspecified diabetic retinopathy without macular edema: Secondary | ICD-10-CM | POA: Diagnosis not present

## 2015-03-28 DIAGNOSIS — M6281 Muscle weakness (generalized): Secondary | ICD-10-CM | POA: Diagnosis not present

## 2015-03-28 DIAGNOSIS — I5032 Chronic diastolic (congestive) heart failure: Secondary | ICD-10-CM | POA: Diagnosis not present

## 2015-03-28 DIAGNOSIS — I519 Heart disease, unspecified: Secondary | ICD-10-CM | POA: Diagnosis not present

## 2015-03-28 DIAGNOSIS — R4781 Slurred speech: Secondary | ICD-10-CM | POA: Diagnosis not present

## 2015-03-28 DIAGNOSIS — G4739 Other sleep apnea: Secondary | ICD-10-CM | POA: Diagnosis not present

## 2015-03-28 DIAGNOSIS — I130 Hypertensive heart and chronic kidney disease with heart failure and stage 1 through stage 4 chronic kidney disease, or unspecified chronic kidney disease: Secondary | ICD-10-CM | POA: Diagnosis not present

## 2015-03-28 DIAGNOSIS — Z7982 Long term (current) use of aspirin: Secondary | ICD-10-CM | POA: Diagnosis not present

## 2015-03-28 DIAGNOSIS — I639 Cerebral infarction, unspecified: Secondary | ICD-10-CM | POA: Insufficient documentation

## 2015-03-28 DIAGNOSIS — G4733 Obstructive sleep apnea (adult) (pediatric): Secondary | ICD-10-CM | POA: Insufficient documentation

## 2015-03-28 DIAGNOSIS — I440 Atrioventricular block, first degree: Secondary | ICD-10-CM | POA: Diagnosis not present

## 2015-03-28 DIAGNOSIS — E785 Hyperlipidemia, unspecified: Secondary | ICD-10-CM | POA: Insufficient documentation

## 2015-03-28 DIAGNOSIS — I517 Cardiomegaly: Secondary | ICD-10-CM | POA: Diagnosis not present

## 2015-03-28 DIAGNOSIS — I6350 Cerebral infarction due to unspecified occlusion or stenosis of unspecified cerebral artery: Secondary | ICD-10-CM | POA: Diagnosis not present

## 2015-03-28 DIAGNOSIS — I6359 Cerebral infarction due to unspecified occlusion or stenosis of other cerebral artery: Secondary | ICD-10-CM | POA: Diagnosis not present

## 2015-03-28 DIAGNOSIS — I340 Nonrheumatic mitral (valve) insufficiency: Secondary | ICD-10-CM | POA: Diagnosis not present

## 2015-03-28 DIAGNOSIS — I63232 Cerebral infarction due to unspecified occlusion or stenosis of left carotid arteries: Secondary | ICD-10-CM | POA: Diagnosis not present

## 2015-03-28 DIAGNOSIS — I700 Atherosclerosis of aorta: Secondary | ICD-10-CM | POA: Diagnosis not present

## 2015-03-28 DIAGNOSIS — R278 Other lack of coordination: Secondary | ICD-10-CM | POA: Diagnosis not present

## 2015-03-28 DIAGNOSIS — R2689 Other abnormalities of gait and mobility: Secondary | ICD-10-CM | POA: Diagnosis not present

## 2015-03-31 DIAGNOSIS — E1165 Type 2 diabetes mellitus with hyperglycemia: Secondary | ICD-10-CM | POA: Diagnosis not present

## 2015-03-31 DIAGNOSIS — E119 Type 2 diabetes mellitus without complications: Secondary | ICD-10-CM | POA: Diagnosis not present

## 2015-03-31 DIAGNOSIS — I509 Heart failure, unspecified: Secondary | ICD-10-CM | POA: Diagnosis not present

## 2015-03-31 DIAGNOSIS — I10 Essential (primary) hypertension: Secondary | ICD-10-CM | POA: Diagnosis not present

## 2015-03-31 DIAGNOSIS — R2681 Unsteadiness on feet: Secondary | ICD-10-CM | POA: Diagnosis not present

## 2015-03-31 DIAGNOSIS — G4739 Other sleep apnea: Secondary | ICD-10-CM | POA: Diagnosis not present

## 2015-03-31 DIAGNOSIS — R2689 Other abnormalities of gait and mobility: Secondary | ICD-10-CM | POA: Diagnosis not present

## 2015-03-31 DIAGNOSIS — I6359 Cerebral infarction due to unspecified occlusion or stenosis of other cerebral artery: Secondary | ICD-10-CM | POA: Diagnosis not present

## 2015-03-31 DIAGNOSIS — R278 Other lack of coordination: Secondary | ICD-10-CM | POA: Diagnosis not present

## 2015-03-31 DIAGNOSIS — K219 Gastro-esophageal reflux disease without esophagitis: Secondary | ICD-10-CM | POA: Diagnosis not present

## 2015-03-31 DIAGNOSIS — Z79899 Other long term (current) drug therapy: Secondary | ICD-10-CM | POA: Diagnosis not present

## 2015-03-31 DIAGNOSIS — I69954 Hemiplegia and hemiparesis following unspecified cerebrovascular disease affecting left non-dominant side: Secondary | ICD-10-CM | POA: Diagnosis not present

## 2015-03-31 DIAGNOSIS — M6281 Muscle weakness (generalized): Secondary | ICD-10-CM | POA: Diagnosis not present

## 2015-03-31 DIAGNOSIS — E11319 Type 2 diabetes mellitus with unspecified diabetic retinopathy without macular edema: Secondary | ICD-10-CM | POA: Diagnosis not present

## 2015-04-05 ENCOUNTER — Telehealth: Payer: Self-pay | Admitting: Family Medicine

## 2015-04-05 NOTE — Telephone Encounter (Signed)
Ok-jh 

## 2015-04-05 NOTE — Telephone Encounter (Signed)
[employer] @ [phone]. Spoke to nurse Peter Congo) and advised of insulin dose/ quantity per epic. Spoke with patient daughter and let her know there should be a physician to manage care while. She says Dr. Is out of center until tomorrow but would appreciate it. Peter Congo states she has been getting 30 before breakfast and 45 at dinner.

## 2015-04-05 NOTE — Telephone Encounter (Signed)
Please suggest? 

## 2015-04-05 NOTE — Telephone Encounter (Signed)
Pt recently had a stroke and has been at [facility] in [place] for about a week.  They were only giving pt her insulin once daily and her readings were any where from 300 to 500.  She was given the insulin twice yesterday and the numbers did come down some.  Can Dr. Luan Pulling send an order down instructing them on the number of times pt should receive insulin?  Judy's call back numbers are [phone] or [phone].  The number to [employer] is [phone]

## 2015-04-06 DIAGNOSIS — E1165 Type 2 diabetes mellitus with hyperglycemia: Secondary | ICD-10-CM | POA: Diagnosis not present

## 2015-04-07 DIAGNOSIS — Z79899 Other long term (current) drug therapy: Secondary | ICD-10-CM | POA: Diagnosis not present

## 2015-04-23 DIAGNOSIS — E1165 Type 2 diabetes mellitus with hyperglycemia: Secondary | ICD-10-CM | POA: Diagnosis not present

## 2015-04-23 DIAGNOSIS — K219 Gastro-esophageal reflux disease without esophagitis: Secondary | ICD-10-CM | POA: Diagnosis not present

## 2015-04-28 ENCOUNTER — Encounter: Payer: Self-pay | Admitting: *Deleted

## 2015-04-28 ENCOUNTER — Ambulatory Visit: Payer: Commercial Managed Care - HMO | Admitting: Cardiovascular Disease

## 2015-04-30 ENCOUNTER — Telehealth: Payer: Self-pay | Admitting: *Deleted

## 2015-04-30 NOTE — Telephone Encounter (Signed)
Called patient's daughter to see how she is doing. She says patient still in rehab. She says she is still not walking well. She will be in there 1 more week and they will re-access. We have tried several companies re: dm shoes. [employer] out of network with [employer]. [employer] has d/c selling of dm shoes. They have mailed out a letter with contact information for Wende Neighbors out of [facility] [place] who will service.[place] [phone].

## 2015-05-06 ENCOUNTER — Other Ambulatory Visit: Payer: Self-pay | Admitting: Family Medicine

## 2015-05-06 DIAGNOSIS — E11319 Type 2 diabetes mellitus with unspecified diabetic retinopathy without macular edema: Secondary | ICD-10-CM | POA: Diagnosis not present

## 2015-05-06 DIAGNOSIS — M6281 Muscle weakness (generalized): Secondary | ICD-10-CM | POA: Diagnosis not present

## 2015-05-06 DIAGNOSIS — I509 Heart failure, unspecified: Secondary | ICD-10-CM | POA: Diagnosis not present

## 2015-05-06 DIAGNOSIS — I130 Hypertensive heart and chronic kidney disease with heart failure and stage 1 through stage 4 chronic kidney disease, or unspecified chronic kidney disease: Secondary | ICD-10-CM | POA: Diagnosis not present

## 2015-05-06 DIAGNOSIS — Z794 Long term (current) use of insulin: Secondary | ICD-10-CM | POA: Diagnosis not present

## 2015-05-06 DIAGNOSIS — I10 Essential (primary) hypertension: Secondary | ICD-10-CM | POA: Diagnosis not present

## 2015-05-06 DIAGNOSIS — E785 Hyperlipidemia, unspecified: Secondary | ICD-10-CM | POA: Diagnosis not present

## 2015-05-06 DIAGNOSIS — K219 Gastro-esophageal reflux disease without esophagitis: Secondary | ICD-10-CM | POA: Diagnosis not present

## 2015-05-06 DIAGNOSIS — I69954 Hemiplegia and hemiparesis following unspecified cerebrovascular disease affecting left non-dominant side: Secondary | ICD-10-CM | POA: Diagnosis not present

## 2015-05-06 DIAGNOSIS — G4739 Other sleep apnea: Secondary | ICD-10-CM | POA: Diagnosis not present

## 2015-05-07 DIAGNOSIS — I509 Heart failure, unspecified: Secondary | ICD-10-CM | POA: Diagnosis not present

## 2015-05-07 DIAGNOSIS — K219 Gastro-esophageal reflux disease without esophagitis: Secondary | ICD-10-CM | POA: Diagnosis not present

## 2015-05-07 DIAGNOSIS — I130 Hypertensive heart and chronic kidney disease with heart failure and stage 1 through stage 4 chronic kidney disease, or unspecified chronic kidney disease: Secondary | ICD-10-CM | POA: Diagnosis not present

## 2015-05-07 DIAGNOSIS — I69954 Hemiplegia and hemiparesis following unspecified cerebrovascular disease affecting left non-dominant side: Secondary | ICD-10-CM | POA: Diagnosis not present

## 2015-05-07 DIAGNOSIS — Z794 Long term (current) use of insulin: Secondary | ICD-10-CM | POA: Diagnosis not present

## 2015-05-07 DIAGNOSIS — E785 Hyperlipidemia, unspecified: Secondary | ICD-10-CM | POA: Diagnosis not present

## 2015-05-07 DIAGNOSIS — E11319 Type 2 diabetes mellitus with unspecified diabetic retinopathy without macular edema: Secondary | ICD-10-CM | POA: Diagnosis not present

## 2015-05-10 ENCOUNTER — Encounter: Payer: Self-pay | Admitting: Family Medicine

## 2015-05-10 ENCOUNTER — Ambulatory Visit (INDEPENDENT_AMBULATORY_CARE_PROVIDER_SITE_OTHER): Payer: Commercial Managed Care - HMO | Admitting: Family Medicine

## 2015-05-10 VITALS — BP 126/49 | HR 60 | Temp 98.1°F | Resp 16 | Ht 64.0 in | Wt 201.4 lb

## 2015-05-10 DIAGNOSIS — E785 Hyperlipidemia, unspecified: Secondary | ICD-10-CM | POA: Diagnosis not present

## 2015-05-10 DIAGNOSIS — I639 Cerebral infarction, unspecified: Secondary | ICD-10-CM | POA: Diagnosis not present

## 2015-05-10 DIAGNOSIS — E1165 Type 2 diabetes mellitus with hyperglycemia: Secondary | ICD-10-CM

## 2015-05-10 DIAGNOSIS — I10 Essential (primary) hypertension: Secondary | ICD-10-CM | POA: Diagnosis not present

## 2015-05-10 DIAGNOSIS — I63019 Cerebral infarction due to thrombosis of unspecified vertebral artery: Secondary | ICD-10-CM | POA: Diagnosis not present

## 2015-05-10 DIAGNOSIS — I69954 Hemiplegia and hemiparesis following unspecified cerebrovascular disease affecting left non-dominant side: Secondary | ICD-10-CM | POA: Diagnosis not present

## 2015-05-10 DIAGNOSIS — I2584 Coronary atherosclerosis due to calcified coronary lesion: Secondary | ICD-10-CM

## 2015-05-10 DIAGNOSIS — I5030 Unspecified diastolic (congestive) heart failure: Secondary | ICD-10-CM | POA: Diagnosis not present

## 2015-05-10 DIAGNOSIS — Z794 Long term (current) use of insulin: Secondary | ICD-10-CM

## 2015-05-10 DIAGNOSIS — K219 Gastro-esophageal reflux disease without esophagitis: Secondary | ICD-10-CM | POA: Diagnosis not present

## 2015-05-10 DIAGNOSIS — I2510 Atherosclerotic heart disease of native coronary artery without angina pectoris: Secondary | ICD-10-CM | POA: Diagnosis not present

## 2015-05-10 DIAGNOSIS — E118 Type 2 diabetes mellitus with unspecified complications: Secondary | ICD-10-CM | POA: Diagnosis not present

## 2015-05-10 DIAGNOSIS — I130 Hypertensive heart and chronic kidney disease with heart failure and stage 1 through stage 4 chronic kidney disease, or unspecified chronic kidney disease: Secondary | ICD-10-CM | POA: Diagnosis not present

## 2015-05-10 DIAGNOSIS — IMO0002 Reserved for concepts with insufficient information to code with codable children: Secondary | ICD-10-CM

## 2015-05-10 DIAGNOSIS — I509 Heart failure, unspecified: Secondary | ICD-10-CM | POA: Diagnosis not present

## 2015-05-10 DIAGNOSIS — E11319 Type 2 diabetes mellitus with unspecified diabetic retinopathy without macular edema: Secondary | ICD-10-CM | POA: Diagnosis not present

## 2015-05-10 DIAGNOSIS — K5900 Constipation, unspecified: Secondary | ICD-10-CM | POA: Diagnosis not present

## 2015-05-10 DIAGNOSIS — R600 Localized edema: Secondary | ICD-10-CM

## 2015-05-10 DIAGNOSIS — K5909 Other constipation: Secondary | ICD-10-CM

## 2015-05-10 NOTE — Progress Notes (Signed)
Name: [name] [name]   MRN: [id number]    DOB: [birth year]   Date:05/10/2015       Progress Note  Subjective  Chief Complaint  Chief Complaint  Patient presents with  . Diabetes    02/23/2015 -8.2     HPI For f/u of CVA and stay at [facility].  Rehabed in NH and now released to home.  Sh is getting care from her elderly husband, from her family occ. And from [facility] and PT,. But there will end shortly.  She probably needs long term Skilled nursing home but she declines at this time.  Family will not insist.  She needs assistance with getting out of chair and out of bed.  Wheelchair, chair, bed bound.  BSs ranging from 120-334 since being back at home, but mons <240.  She is on sliding scald insulin along with 70/30 Novolin  No problem-specific assessment & plan notes found for this encounter.   Past Medical History  Diagnosis Date  . CAD (coronary artery disease) 2008    + nuc study led to cath and CABG  . HTN (hypertension)   . Hyperlipidemia LDL goal < 70   . CVA (cerebral infarction) 2014  . Diabetes mellitus type 2 with complications, uncontrolled (HCC)     CAD, CVA  . H/O cardiovascular stress test 06/2012    Stable with lateral scar and moderate. Infarction ischemia  . OSA on CPAP 2014     patient is just now beginning CPAP  . Daytime somnolence      hoping CPAP will be able to help this  . Obesity (BMI 30.0-34.9)   . H/O gastritis   . Mallory-Weiss tear     History of  . Claudication in peripheral vascular disease ([place]) 05/2012     ABI right 0.81; left 0.98     Past Surgical History  Procedure Laterality Date  . Coronary artery bypass graft  12/31/2006    CABG x4, LIMA-LAD, sequential VG-diagonal branch of LAD, OM of left circumflex & PLA branch of RCA  . Cholecystectomy      Family History  Problem Relation Age of Onset  . Stroke Mother   . Diabetes Sister   . Diabetes Brother     Social History   Social History  . Marital Status: Single    Spouse  Name: N/A  . Number of Children: N/A  . Years of Education: N/A   Occupational History  . Not on file.   Social History Main Topics  . Smoking status: Former Research scientist (life sciences)  . Smokeless tobacco: Never Used     Comment: 50 years ago  . Alcohol Use: No  . Drug Use: No  . Sexual Activity: Not on file   Other Topics Concern  . Not on file   Social History Narrative     Current outpatient prescriptions:  .  ACCU-CHEK SOFTCLIX LANCETS lancets, CHECK BLOOD SUGARS TWICE DAILY, Disp: 200 each, Rfl: 12 .  aspirin 81 MG tablet, Take 81 mg by mouth daily., Disp: , Rfl:  .  carvedilol (COREG) 12.5 MG tablet, Take 25 mg by mouth 2 (two) times daily with a meal. , Disp: , Rfl:  .  clopidogrel (PLAVIX) 75 MG tablet, TAKE 1 TABLET BY MOUTH EVERY DAY, Disp: 90 tablet, Rfl: 3 .  furosemide (LASIX) 40 MG tablet, TAKE 2 TABLETS BY MOUTH EVERY MORNING FOR FLUID, Disp: 60 tablet, Rfl: 6 .  insulin aspart (NOVOLOG FLEXPEN) 100 UNIT/ML FlexPen, Inject into the  skin 3 (three) times daily with meals. Sliding Scale 2 units-6 units patient has list for 100-400 BS readings, Disp: , Rfl:  .  Insulin NPH Isophane & Regular (NOVOLIN 70/30 [name]), Inject 40-50 Units into the skin. 50 units every morning  40 units every evening, Disp: , Rfl:  .  Insulin Pen Needle (RELION SHORT PEN NEEDLES) 31G X 8 MM MISC, 1 each by Does not apply route 2 (two) times daily., Disp: 50 each, Rfl: 11 .  lisinopril (PRINIVIL,ZESTRIL) 40 MG tablet, TAKE 1 TABLET DAILY, Disp: 30 tablet, Rfl: 3 .  Melatonin 5 MG TABS, Take by mouth., Disp: , Rfl:  .  polyethylene glycol (MIRALAX / GLYCOLAX) packet, USE 1-2 PACKETS DAILY, Disp: 60 packet, Rfl: 0 .  pravastatin (PRAVACHOL) 40 MG tablet, TAKE 1 TABLET BY MOUTH AT BEDTIME, Disp: 30 tablet, Rfl: 3 .  ranitidine (ZANTAC) 150 MG tablet, Take 150 mg by mouth 2 (two) times daily., Disp: , Rfl:  .  spironolactone (ALDACTONE) 25 MG tablet, Take 1 tablet (25 mg total) by mouth daily., Disp: 30 tablet, Rfl:  6 .  amLODipine (NORVASC) 5 MG tablet, TAKE 1 TABLET BY MOUTH EVERY DAY (Patient not taking: Reported on 05/10/2015), Disp: 30 tablet, Rfl: 6  Not on File   Review of Systems  Constitutional: Negative for fever, chills, weight loss and malaise/fatigue.  HENT: Negative for hearing loss.   Eyes: Negative for blurred vision and double vision.  Respiratory: Negative for cough, shortness of breath and wheezing.   Cardiovascular: Negative for chest pain, palpitations and leg swelling.  Gastrointestinal: Negative for heartburn, abdominal pain and blood in stool.  Genitourinary: Negative for dysuria, urgency and frequency.  Skin: Negative for rash.  Neurological: Positive for sensory change and focal weakness. Negative for weakness and headaches.       Feels that she is getting better from recent CVA every day..  Feels that she will improve from the CVA and be able to live at home.      Objective  Filed Vitals:   05/10/15 1046 05/10/15 1114  BP: 126/49   Pulse: 56 60  Temp: 98.1 F (36.7 C)   TempSrc: Oral   Resp: 16   Height: 5\' 4"  (1.626 m)   Weight: 201 lb 6.1 oz (91.345 kg)   SpO2: 100%     Physical Exam  Constitutional: She is oriented to person, place, and time and well-developed, well-nourished, and in no distress. No distress.  HENT:  Head: Normocephalic and atraumatic.  Eyes: Conjunctivae and EOM are normal. Pupils are equal, round, and reactive to light. No scleral icterus.  Neck: Normal range of motion. Neck supple. Carotid bruit is not present. No thyromegaly present.  Cardiovascular: Normal rate and regular rhythm.   Murmur heard.  Systolic murmur is present with a grade of 2/6  URSB  Pulmonary/Chest: Effort normal and breath sounds normal. No respiratory distress. She has no wheezes. She has no rales.  Musculoskeletal: She exhibits edema.  Lymphadenopathy:    She has no cervical adenopathy.  Neurological: She is alert and oriented to person, place, and time.   Wheelchair bound.  Barely ale to stand on scales for weight  with maximal assistance by 2 people.  Vitals reviewed.      Recent Results (from the past 2160 hour(s))  POCT HgB A1C     Status: Abnormal   Collection Time: 02/23/15 11:17 AM  Result Value Ref Range   Hemoglobin A1C 8.2  Assessment & Plan  Problem List Items Addressed This Visit      Cardiovascular and Mediastinum   CAD (coronary artery disease)   HTN (hypertension)   Ischemic stroke ([place])   CHF (congestive heart failure) (HCC)   Arteriosclerosis of coronary artery     Digestive   Chronic constipation     Endocrine   Diabetes mellitus type 2 with complications, uncontrolled ([place]) - Primary   Relevant Medications   Insulin NPH Isophane & Regular (NOVOLIN 70/30 [place])   insulin aspart (NOVOLOG FLEXPEN) 100 UNIT/ML FlexPen     Nervous and Auditory   Cerebral infarction ([place])     Other   HLD (hyperlipidemia)   Edema leg      Meds ordered this encounter  Medications  . DISCONTD: atorvastatin (LIPITOR) 80 MG tablet    Sig: Take 80 mg by mouth.  . DISCONTD: insulin lispro (HUMALOG) 100 UNIT/ML injection    Sig: Inject into the skin.  . ranitidine (ZANTAC) 150 MG tablet    Sig: Take 150 mg by mouth 2 (two) times daily.  . Insulin NPH Isophane & Regular (NOVOLIN 70/30 [place])    Sig: Inject 40-50 Units into the skin. 50 units every morning  40 units every evening  . Melatonin 5 MG TABS    Sig: Take by mouth.  . insulin aspart (NOVOLOG FLEXPEN) 100 UNIT/ML FlexPen    Sig: Inject into the skin 3 (three) times daily with meals. Sliding Scale 2 units-6 units patient has list for 100-400 BS readings   1. Uncontrolled type 2 diabetes mellitus with complication, with long-term current use of insulin (HCC) Cont. Novalin 70/30 and sliding scale Nololog  2. Coronary artery disease due to calcified coronary lesion  Cont meds 3. Essential hypertension Cont meds  4. Ischemic stroke ([place])   5. Diastolic  congestive heart failure, unspecified congestive heart failure chronicity ([place])   6. Arteriosclerosis of coronary artery   7. Chronic constipation   8. 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 Bilateral edema of lower extremity Cont meds  10. HLD (hyperlipidemia) Cont meds

## 2015-05-11 DIAGNOSIS — I130 Hypertensive heart and chronic kidney disease with heart failure and stage 1 through stage 4 chronic kidney disease, or unspecified chronic kidney disease: Secondary | ICD-10-CM | POA: Diagnosis not present

## 2015-05-11 DIAGNOSIS — Z794 Long term (current) use of insulin: Secondary | ICD-10-CM | POA: Diagnosis not present

## 2015-05-11 DIAGNOSIS — I69954 Hemiplegia and hemiparesis following unspecified cerebrovascular disease affecting left non-dominant side: Secondary | ICD-10-CM | POA: Diagnosis not present

## 2015-05-11 DIAGNOSIS — E11319 Type 2 diabetes mellitus with unspecified diabetic retinopathy without macular edema: Secondary | ICD-10-CM | POA: Diagnosis not present

## 2015-05-11 DIAGNOSIS — I509 Heart failure, unspecified: Secondary | ICD-10-CM | POA: Diagnosis not present

## 2015-05-11 DIAGNOSIS — K219 Gastro-esophageal reflux disease without esophagitis: Secondary | ICD-10-CM | POA: Diagnosis not present

## 2015-05-11 DIAGNOSIS — E785 Hyperlipidemia, unspecified: Secondary | ICD-10-CM | POA: Diagnosis not present

## 2015-05-12 DIAGNOSIS — Z794 Long term (current) use of insulin: Secondary | ICD-10-CM | POA: Diagnosis not present

## 2015-05-12 DIAGNOSIS — E11319 Type 2 diabetes mellitus with unspecified diabetic retinopathy without macular edema: Secondary | ICD-10-CM | POA: Diagnosis not present

## 2015-05-12 DIAGNOSIS — I130 Hypertensive heart and chronic kidney disease with heart failure and stage 1 through stage 4 chronic kidney disease, or unspecified chronic kidney disease: Secondary | ICD-10-CM | POA: Diagnosis not present

## 2015-05-12 DIAGNOSIS — I69954 Hemiplegia and hemiparesis following unspecified cerebrovascular disease affecting left non-dominant side: Secondary | ICD-10-CM | POA: Diagnosis not present

## 2015-05-12 DIAGNOSIS — K219 Gastro-esophageal reflux disease without esophagitis: Secondary | ICD-10-CM | POA: Diagnosis not present

## 2015-05-12 DIAGNOSIS — I509 Heart failure, unspecified: Secondary | ICD-10-CM | POA: Diagnosis not present

## 2015-05-12 DIAGNOSIS — E785 Hyperlipidemia, unspecified: Secondary | ICD-10-CM | POA: Diagnosis not present

## 2015-05-13 DIAGNOSIS — I69954 Hemiplegia and hemiparesis following unspecified cerebrovascular disease affecting left non-dominant side: Secondary | ICD-10-CM | POA: Diagnosis not present

## 2015-05-13 DIAGNOSIS — I130 Hypertensive heart and chronic kidney disease with heart failure and stage 1 through stage 4 chronic kidney disease, or unspecified chronic kidney disease: Secondary | ICD-10-CM | POA: Diagnosis not present

## 2015-05-13 DIAGNOSIS — E11319 Type 2 diabetes mellitus with unspecified diabetic retinopathy without macular edema: Secondary | ICD-10-CM | POA: Diagnosis not present

## 2015-05-13 DIAGNOSIS — K219 Gastro-esophageal reflux disease without esophagitis: Secondary | ICD-10-CM | POA: Diagnosis not present

## 2015-05-13 DIAGNOSIS — I509 Heart failure, unspecified: Secondary | ICD-10-CM | POA: Diagnosis not present

## 2015-05-13 DIAGNOSIS — E785 Hyperlipidemia, unspecified: Secondary | ICD-10-CM | POA: Diagnosis not present

## 2015-05-13 DIAGNOSIS — Z794 Long term (current) use of insulin: Secondary | ICD-10-CM | POA: Diagnosis not present

## 2015-05-14 DIAGNOSIS — I130 Hypertensive heart and chronic kidney disease with heart failure and stage 1 through stage 4 chronic kidney disease, or unspecified chronic kidney disease: Secondary | ICD-10-CM | POA: Diagnosis not present

## 2015-05-14 DIAGNOSIS — I69954 Hemiplegia and hemiparesis following unspecified cerebrovascular disease affecting left non-dominant side: Secondary | ICD-10-CM | POA: Diagnosis not present

## 2015-05-14 DIAGNOSIS — I509 Heart failure, unspecified: Secondary | ICD-10-CM | POA: Diagnosis not present

## 2015-05-14 DIAGNOSIS — E785 Hyperlipidemia, unspecified: Secondary | ICD-10-CM | POA: Diagnosis not present

## 2015-05-14 DIAGNOSIS — Z794 Long term (current) use of insulin: Secondary | ICD-10-CM | POA: Diagnosis not present

## 2015-05-14 DIAGNOSIS — K219 Gastro-esophageal reflux disease without esophagitis: Secondary | ICD-10-CM | POA: Diagnosis not present

## 2015-05-14 DIAGNOSIS — E11319 Type 2 diabetes mellitus with unspecified diabetic retinopathy without macular edema: Secondary | ICD-10-CM | POA: Diagnosis not present

## 2015-05-17 DIAGNOSIS — E11319 Type 2 diabetes mellitus with unspecified diabetic retinopathy without macular edema: Secondary | ICD-10-CM | POA: Diagnosis not present

## 2015-05-17 DIAGNOSIS — Z794 Long term (current) use of insulin: Secondary | ICD-10-CM | POA: Diagnosis not present

## 2015-05-17 DIAGNOSIS — I69954 Hemiplegia and hemiparesis following unspecified cerebrovascular disease affecting left non-dominant side: Secondary | ICD-10-CM | POA: Diagnosis not present

## 2015-05-17 DIAGNOSIS — K219 Gastro-esophageal reflux disease without esophagitis: Secondary | ICD-10-CM | POA: Diagnosis not present

## 2015-05-17 DIAGNOSIS — I130 Hypertensive heart and chronic kidney disease with heart failure and stage 1 through stage 4 chronic kidney disease, or unspecified chronic kidney disease: Secondary | ICD-10-CM | POA: Diagnosis not present

## 2015-05-17 DIAGNOSIS — E785 Hyperlipidemia, unspecified: Secondary | ICD-10-CM | POA: Diagnosis not present

## 2015-05-17 DIAGNOSIS — I509 Heart failure, unspecified: Secondary | ICD-10-CM | POA: Diagnosis not present

## 2015-05-18 DIAGNOSIS — K219 Gastro-esophageal reflux disease without esophagitis: Secondary | ICD-10-CM | POA: Diagnosis not present

## 2015-05-18 DIAGNOSIS — I130 Hypertensive heart and chronic kidney disease with heart failure and stage 1 through stage 4 chronic kidney disease, or unspecified chronic kidney disease: Secondary | ICD-10-CM | POA: Diagnosis not present

## 2015-05-18 DIAGNOSIS — I69954 Hemiplegia and hemiparesis following unspecified cerebrovascular disease affecting left non-dominant side: Secondary | ICD-10-CM | POA: Diagnosis not present

## 2015-05-18 DIAGNOSIS — I509 Heart failure, unspecified: Secondary | ICD-10-CM | POA: Diagnosis not present

## 2015-05-18 DIAGNOSIS — E785 Hyperlipidemia, unspecified: Secondary | ICD-10-CM | POA: Diagnosis not present

## 2015-05-18 DIAGNOSIS — Z794 Long term (current) use of insulin: Secondary | ICD-10-CM | POA: Diagnosis not present

## 2015-05-18 DIAGNOSIS — E11319 Type 2 diabetes mellitus with unspecified diabetic retinopathy without macular edema: Secondary | ICD-10-CM | POA: Diagnosis not present

## 2015-05-19 ENCOUNTER — Telehealth: Payer: Self-pay | Admitting: *Deleted

## 2015-05-19 DIAGNOSIS — I130 Hypertensive heart and chronic kidney disease with heart failure and stage 1 through stage 4 chronic kidney disease, or unspecified chronic kidney disease: Secondary | ICD-10-CM | POA: Diagnosis not present

## 2015-05-19 DIAGNOSIS — K219 Gastro-esophageal reflux disease without esophagitis: Secondary | ICD-10-CM | POA: Diagnosis not present

## 2015-05-19 DIAGNOSIS — I509 Heart failure, unspecified: Secondary | ICD-10-CM | POA: Diagnosis not present

## 2015-05-19 DIAGNOSIS — I69954 Hemiplegia and hemiparesis following unspecified cerebrovascular disease affecting left non-dominant side: Secondary | ICD-10-CM | POA: Diagnosis not present

## 2015-05-19 DIAGNOSIS — E785 Hyperlipidemia, unspecified: Secondary | ICD-10-CM | POA: Diagnosis not present

## 2015-05-19 DIAGNOSIS — E11319 Type 2 diabetes mellitus with unspecified diabetic retinopathy without macular edema: Secondary | ICD-10-CM | POA: Diagnosis not present

## 2015-05-19 DIAGNOSIS — Z794 Long term (current) use of insulin: Secondary | ICD-10-CM | POA: Diagnosis not present

## 2015-05-19 NOTE — Telephone Encounter (Signed)
Santiago Glad with [facility] calling to inform: 4-5 lbs weight gain in patient in 2 days. Also blood pressure 90/60. Please advise.

## 2015-05-20 DIAGNOSIS — E11319 Type 2 diabetes mellitus with unspecified diabetic retinopathy without macular edema: Secondary | ICD-10-CM | POA: Diagnosis not present

## 2015-05-20 DIAGNOSIS — E785 Hyperlipidemia, unspecified: Secondary | ICD-10-CM | POA: Diagnosis not present

## 2015-05-20 DIAGNOSIS — K219 Gastro-esophageal reflux disease without esophagitis: Secondary | ICD-10-CM | POA: Diagnosis not present

## 2015-05-20 DIAGNOSIS — I509 Heart failure, unspecified: Secondary | ICD-10-CM | POA: Diagnosis not present

## 2015-05-20 DIAGNOSIS — I130 Hypertensive heart and chronic kidney disease with heart failure and stage 1 through stage 4 chronic kidney disease, or unspecified chronic kidney disease: Secondary | ICD-10-CM | POA: Diagnosis not present

## 2015-05-20 DIAGNOSIS — I69954 Hemiplegia and hemiparesis following unspecified cerebrovascular disease affecting left non-dominant side: Secondary | ICD-10-CM | POA: Diagnosis not present

## 2015-05-20 DIAGNOSIS — Z794 Long term (current) use of insulin: Secondary | ICD-10-CM | POA: Diagnosis not present

## 2015-05-20 NOTE — Telephone Encounter (Signed)
She should hava a cardiologist.  They need to be contacted for their recommendation.  If no Card. Available, she needs to go to ER with worsening CHF and low BP.-jh

## 2015-05-20 NOTE — Telephone Encounter (Signed)
Called Santiago Glad back at [phone]. Informed that patient needs to reschedule cardiology appt that was missed in March due to hospitalization/rehab. She needs to go to ER if she gets worse. Santiago Glad will call patient and inform. Nothing further needed.[place]

## 2015-05-21 DIAGNOSIS — E11319 Type 2 diabetes mellitus with unspecified diabetic retinopathy without macular edema: Secondary | ICD-10-CM | POA: Diagnosis not present

## 2015-05-21 DIAGNOSIS — K219 Gastro-esophageal reflux disease without esophagitis: Secondary | ICD-10-CM | POA: Diagnosis not present

## 2015-05-21 DIAGNOSIS — E785 Hyperlipidemia, unspecified: Secondary | ICD-10-CM | POA: Diagnosis not present

## 2015-05-21 DIAGNOSIS — I509 Heart failure, unspecified: Secondary | ICD-10-CM | POA: Diagnosis not present

## 2015-05-21 DIAGNOSIS — I69954 Hemiplegia and hemiparesis following unspecified cerebrovascular disease affecting left non-dominant side: Secondary | ICD-10-CM | POA: Diagnosis not present

## 2015-05-21 DIAGNOSIS — Z794 Long term (current) use of insulin: Secondary | ICD-10-CM | POA: Diagnosis not present

## 2015-05-21 DIAGNOSIS — I130 Hypertensive heart and chronic kidney disease with heart failure and stage 1 through stage 4 chronic kidney disease, or unspecified chronic kidney disease: Secondary | ICD-10-CM | POA: Diagnosis not present

## 2015-05-24 DIAGNOSIS — E785 Hyperlipidemia, unspecified: Secondary | ICD-10-CM | POA: Diagnosis not present

## 2015-05-24 DIAGNOSIS — I69954 Hemiplegia and hemiparesis following unspecified cerebrovascular disease affecting left non-dominant side: Secondary | ICD-10-CM | POA: Diagnosis not present

## 2015-05-24 DIAGNOSIS — I509 Heart failure, unspecified: Secondary | ICD-10-CM | POA: Diagnosis not present

## 2015-05-24 DIAGNOSIS — E11319 Type 2 diabetes mellitus with unspecified diabetic retinopathy without macular edema: Secondary | ICD-10-CM | POA: Diagnosis not present

## 2015-05-24 DIAGNOSIS — I130 Hypertensive heart and chronic kidney disease with heart failure and stage 1 through stage 4 chronic kidney disease, or unspecified chronic kidney disease: Secondary | ICD-10-CM | POA: Diagnosis not present

## 2015-05-24 DIAGNOSIS — Z794 Long term (current) use of insulin: Secondary | ICD-10-CM | POA: Diagnosis not present

## 2015-05-24 DIAGNOSIS — K219 Gastro-esophageal reflux disease without esophagitis: Secondary | ICD-10-CM | POA: Diagnosis not present

## 2015-05-25 DIAGNOSIS — I69954 Hemiplegia and hemiparesis following unspecified cerebrovascular disease affecting left non-dominant side: Secondary | ICD-10-CM | POA: Diagnosis not present

## 2015-05-25 DIAGNOSIS — E785 Hyperlipidemia, unspecified: Secondary | ICD-10-CM | POA: Diagnosis not present

## 2015-05-25 DIAGNOSIS — I509 Heart failure, unspecified: Secondary | ICD-10-CM | POA: Diagnosis not present

## 2015-05-25 DIAGNOSIS — Z794 Long term (current) use of insulin: Secondary | ICD-10-CM | POA: Diagnosis not present

## 2015-05-25 DIAGNOSIS — I130 Hypertensive heart and chronic kidney disease with heart failure and stage 1 through stage 4 chronic kidney disease, or unspecified chronic kidney disease: Secondary | ICD-10-CM | POA: Diagnosis not present

## 2015-05-25 DIAGNOSIS — K219 Gastro-esophageal reflux disease without esophagitis: Secondary | ICD-10-CM | POA: Diagnosis not present

## 2015-05-25 DIAGNOSIS — E11319 Type 2 diabetes mellitus with unspecified diabetic retinopathy without macular edema: Secondary | ICD-10-CM | POA: Diagnosis not present

## 2015-05-26 DIAGNOSIS — I130 Hypertensive heart and chronic kidney disease with heart failure and stage 1 through stage 4 chronic kidney disease, or unspecified chronic kidney disease: Secondary | ICD-10-CM | POA: Diagnosis not present

## 2015-05-26 DIAGNOSIS — I509 Heart failure, unspecified: Secondary | ICD-10-CM | POA: Diagnosis not present

## 2015-05-26 DIAGNOSIS — K219 Gastro-esophageal reflux disease without esophagitis: Secondary | ICD-10-CM | POA: Diagnosis not present

## 2015-05-26 DIAGNOSIS — Z794 Long term (current) use of insulin: Secondary | ICD-10-CM | POA: Diagnosis not present

## 2015-05-26 DIAGNOSIS — E785 Hyperlipidemia, unspecified: Secondary | ICD-10-CM | POA: Diagnosis not present

## 2015-05-26 DIAGNOSIS — E11319 Type 2 diabetes mellitus with unspecified diabetic retinopathy without macular edema: Secondary | ICD-10-CM | POA: Diagnosis not present

## 2015-05-26 DIAGNOSIS — I69954 Hemiplegia and hemiparesis following unspecified cerebrovascular disease affecting left non-dominant side: Secondary | ICD-10-CM | POA: Diagnosis not present

## 2015-05-27 DIAGNOSIS — E785 Hyperlipidemia, unspecified: Secondary | ICD-10-CM | POA: Diagnosis not present

## 2015-05-27 DIAGNOSIS — I509 Heart failure, unspecified: Secondary | ICD-10-CM | POA: Diagnosis not present

## 2015-05-27 DIAGNOSIS — I130 Hypertensive heart and chronic kidney disease with heart failure and stage 1 through stage 4 chronic kidney disease, or unspecified chronic kidney disease: Secondary | ICD-10-CM | POA: Diagnosis not present

## 2015-05-27 DIAGNOSIS — Z794 Long term (current) use of insulin: Secondary | ICD-10-CM | POA: Diagnosis not present

## 2015-05-27 DIAGNOSIS — K219 Gastro-esophageal reflux disease without esophagitis: Secondary | ICD-10-CM | POA: Diagnosis not present

## 2015-05-27 DIAGNOSIS — I69954 Hemiplegia and hemiparesis following unspecified cerebrovascular disease affecting left non-dominant side: Secondary | ICD-10-CM | POA: Diagnosis not present

## 2015-05-27 DIAGNOSIS — E11319 Type 2 diabetes mellitus with unspecified diabetic retinopathy without macular edema: Secondary | ICD-10-CM | POA: Diagnosis not present

## 2015-05-28 ENCOUNTER — Telehealth: Payer: Self-pay | Admitting: Family Medicine

## 2015-05-28 DIAGNOSIS — I130 Hypertensive heart and chronic kidney disease with heart failure and stage 1 through stage 4 chronic kidney disease, or unspecified chronic kidney disease: Secondary | ICD-10-CM | POA: Diagnosis not present

## 2015-05-28 DIAGNOSIS — K219 Gastro-esophageal reflux disease without esophagitis: Secondary | ICD-10-CM | POA: Diagnosis not present

## 2015-05-28 DIAGNOSIS — Z794 Long term (current) use of insulin: Secondary | ICD-10-CM | POA: Diagnosis not present

## 2015-05-28 DIAGNOSIS — I69954 Hemiplegia and hemiparesis following unspecified cerebrovascular disease affecting left non-dominant side: Secondary | ICD-10-CM | POA: Diagnosis not present

## 2015-05-28 DIAGNOSIS — E785 Hyperlipidemia, unspecified: Secondary | ICD-10-CM | POA: Diagnosis not present

## 2015-05-28 DIAGNOSIS — I509 Heart failure, unspecified: Secondary | ICD-10-CM | POA: Diagnosis not present

## 2015-05-28 DIAGNOSIS — E11319 Type 2 diabetes mellitus with unspecified diabetic retinopathy without macular edema: Secondary | ICD-10-CM | POA: Diagnosis not present

## 2015-05-28 NOTE — Telephone Encounter (Signed)
[name] [name] with the [facility] needs a verbal for OT twice a week for 3 weeks starting Monday morning.  Her call back number is [phone] and just leave a message.

## 2015-05-28 NOTE — Telephone Encounter (Signed)
Verbal order left on VM  

## 2015-05-31 DIAGNOSIS — I130 Hypertensive heart and chronic kidney disease with heart failure and stage 1 through stage 4 chronic kidney disease, or unspecified chronic kidney disease: Secondary | ICD-10-CM | POA: Diagnosis not present

## 2015-05-31 DIAGNOSIS — I509 Heart failure, unspecified: Secondary | ICD-10-CM | POA: Diagnosis not present

## 2015-05-31 DIAGNOSIS — E785 Hyperlipidemia, unspecified: Secondary | ICD-10-CM | POA: Diagnosis not present

## 2015-05-31 DIAGNOSIS — E11319 Type 2 diabetes mellitus with unspecified diabetic retinopathy without macular edema: Secondary | ICD-10-CM | POA: Diagnosis not present

## 2015-05-31 DIAGNOSIS — I69954 Hemiplegia and hemiparesis following unspecified cerebrovascular disease affecting left non-dominant side: Secondary | ICD-10-CM | POA: Diagnosis not present

## 2015-05-31 DIAGNOSIS — K219 Gastro-esophageal reflux disease without esophagitis: Secondary | ICD-10-CM | POA: Diagnosis not present

## 2015-05-31 DIAGNOSIS — Z794 Long term (current) use of insulin: Secondary | ICD-10-CM | POA: Diagnosis not present

## 2015-06-01 ENCOUNTER — Ambulatory Visit: Payer: Commercial Managed Care - HMO | Admitting: Family Medicine

## 2015-06-02 DIAGNOSIS — Z794 Long term (current) use of insulin: Secondary | ICD-10-CM | POA: Diagnosis not present

## 2015-06-02 DIAGNOSIS — E11319 Type 2 diabetes mellitus with unspecified diabetic retinopathy without macular edema: Secondary | ICD-10-CM | POA: Diagnosis not present

## 2015-06-02 DIAGNOSIS — I509 Heart failure, unspecified: Secondary | ICD-10-CM | POA: Diagnosis not present

## 2015-06-02 DIAGNOSIS — I69954 Hemiplegia and hemiparesis following unspecified cerebrovascular disease affecting left non-dominant side: Secondary | ICD-10-CM | POA: Diagnosis not present

## 2015-06-02 DIAGNOSIS — I130 Hypertensive heart and chronic kidney disease with heart failure and stage 1 through stage 4 chronic kidney disease, or unspecified chronic kidney disease: Secondary | ICD-10-CM | POA: Diagnosis not present

## 2015-06-02 DIAGNOSIS — K219 Gastro-esophageal reflux disease without esophagitis: Secondary | ICD-10-CM | POA: Diagnosis not present

## 2015-06-02 DIAGNOSIS — E785 Hyperlipidemia, unspecified: Secondary | ICD-10-CM | POA: Diagnosis not present

## 2015-06-03 DIAGNOSIS — I130 Hypertensive heart and chronic kidney disease with heart failure and stage 1 through stage 4 chronic kidney disease, or unspecified chronic kidney disease: Secondary | ICD-10-CM | POA: Diagnosis not present

## 2015-06-03 DIAGNOSIS — I69954 Hemiplegia and hemiparesis following unspecified cerebrovascular disease affecting left non-dominant side: Secondary | ICD-10-CM | POA: Diagnosis not present

## 2015-06-03 DIAGNOSIS — I509 Heart failure, unspecified: Secondary | ICD-10-CM | POA: Diagnosis not present

## 2015-06-03 DIAGNOSIS — E785 Hyperlipidemia, unspecified: Secondary | ICD-10-CM | POA: Diagnosis not present

## 2015-06-03 DIAGNOSIS — E11319 Type 2 diabetes mellitus with unspecified diabetic retinopathy without macular edema: Secondary | ICD-10-CM | POA: Diagnosis not present

## 2015-06-03 DIAGNOSIS — Z794 Long term (current) use of insulin: Secondary | ICD-10-CM | POA: Diagnosis not present

## 2015-06-03 DIAGNOSIS — K219 Gastro-esophageal reflux disease without esophagitis: Secondary | ICD-10-CM | POA: Diagnosis not present

## 2015-06-04 DIAGNOSIS — I130 Hypertensive heart and chronic kidney disease with heart failure and stage 1 through stage 4 chronic kidney disease, or unspecified chronic kidney disease: Secondary | ICD-10-CM | POA: Diagnosis not present

## 2015-06-04 DIAGNOSIS — E11319 Type 2 diabetes mellitus with unspecified diabetic retinopathy without macular edema: Secondary | ICD-10-CM | POA: Diagnosis not present

## 2015-06-04 DIAGNOSIS — E785 Hyperlipidemia, unspecified: Secondary | ICD-10-CM | POA: Diagnosis not present

## 2015-06-04 DIAGNOSIS — I69954 Hemiplegia and hemiparesis following unspecified cerebrovascular disease affecting left non-dominant side: Secondary | ICD-10-CM | POA: Diagnosis not present

## 2015-06-04 DIAGNOSIS — K219 Gastro-esophageal reflux disease without esophagitis: Secondary | ICD-10-CM | POA: Diagnosis not present

## 2015-06-04 DIAGNOSIS — Z794 Long term (current) use of insulin: Secondary | ICD-10-CM | POA: Diagnosis not present

## 2015-06-04 DIAGNOSIS — I509 Heart failure, unspecified: Secondary | ICD-10-CM | POA: Diagnosis not present

## 2015-06-07 DIAGNOSIS — E11319 Type 2 diabetes mellitus with unspecified diabetic retinopathy without macular edema: Secondary | ICD-10-CM | POA: Diagnosis not present

## 2015-06-07 DIAGNOSIS — E785 Hyperlipidemia, unspecified: Secondary | ICD-10-CM | POA: Diagnosis not present

## 2015-06-07 DIAGNOSIS — K219 Gastro-esophageal reflux disease without esophagitis: Secondary | ICD-10-CM | POA: Diagnosis not present

## 2015-06-07 DIAGNOSIS — I130 Hypertensive heart and chronic kidney disease with heart failure and stage 1 through stage 4 chronic kidney disease, or unspecified chronic kidney disease: Secondary | ICD-10-CM | POA: Diagnosis not present

## 2015-06-07 DIAGNOSIS — Z794 Long term (current) use of insulin: Secondary | ICD-10-CM | POA: Diagnosis not present

## 2015-06-07 DIAGNOSIS — I69954 Hemiplegia and hemiparesis following unspecified cerebrovascular disease affecting left non-dominant side: Secondary | ICD-10-CM | POA: Diagnosis not present

## 2015-06-07 DIAGNOSIS — I509 Heart failure, unspecified: Secondary | ICD-10-CM | POA: Diagnosis not present

## 2015-06-09 DIAGNOSIS — E785 Hyperlipidemia, unspecified: Secondary | ICD-10-CM | POA: Diagnosis not present

## 2015-06-09 DIAGNOSIS — I130 Hypertensive heart and chronic kidney disease with heart failure and stage 1 through stage 4 chronic kidney disease, or unspecified chronic kidney disease: Secondary | ICD-10-CM | POA: Diagnosis not present

## 2015-06-09 DIAGNOSIS — K219 Gastro-esophageal reflux disease without esophagitis: Secondary | ICD-10-CM | POA: Diagnosis not present

## 2015-06-09 DIAGNOSIS — I69954 Hemiplegia and hemiparesis following unspecified cerebrovascular disease affecting left non-dominant side: Secondary | ICD-10-CM | POA: Diagnosis not present

## 2015-06-09 DIAGNOSIS — I509 Heart failure, unspecified: Secondary | ICD-10-CM | POA: Diagnosis not present

## 2015-06-09 DIAGNOSIS — E11319 Type 2 diabetes mellitus with unspecified diabetic retinopathy without macular edema: Secondary | ICD-10-CM | POA: Diagnosis not present

## 2015-06-09 DIAGNOSIS — Z794 Long term (current) use of insulin: Secondary | ICD-10-CM | POA: Diagnosis not present

## 2015-06-10 DIAGNOSIS — Z9181 History of falling: Secondary | ICD-10-CM | POA: Diagnosis not present

## 2015-06-10 DIAGNOSIS — I639 Cerebral infarction, unspecified: Secondary | ICD-10-CM | POA: Diagnosis not present

## 2015-06-10 DIAGNOSIS — I2510 Atherosclerotic heart disease of native coronary artery without angina pectoris: Secondary | ICD-10-CM | POA: Diagnosis not present

## 2015-06-10 DIAGNOSIS — E113593 Type 2 diabetes mellitus with proliferative diabetic retinopathy without macular edema, bilateral: Secondary | ICD-10-CM | POA: Diagnosis not present

## 2015-06-10 DIAGNOSIS — G4733 Obstructive sleep apnea (adult) (pediatric): Secondary | ICD-10-CM | POA: Diagnosis not present

## 2015-06-10 DIAGNOSIS — I509 Heart failure, unspecified: Secondary | ICD-10-CM | POA: Diagnosis not present

## 2015-06-10 DIAGNOSIS — Z1389 Encounter for screening for other disorder: Secondary | ICD-10-CM | POA: Diagnosis not present

## 2015-06-10 DIAGNOSIS — I739 Peripheral vascular disease, unspecified: Secondary | ICD-10-CM | POA: Diagnosis not present

## 2015-06-10 DIAGNOSIS — I10 Essential (primary) hypertension: Secondary | ICD-10-CM | POA: Diagnosis not present

## 2015-06-11 DIAGNOSIS — E11319 Type 2 diabetes mellitus with unspecified diabetic retinopathy without macular edema: Secondary | ICD-10-CM | POA: Diagnosis not present

## 2015-06-11 DIAGNOSIS — K219 Gastro-esophageal reflux disease without esophagitis: Secondary | ICD-10-CM | POA: Diagnosis not present

## 2015-06-11 DIAGNOSIS — I509 Heart failure, unspecified: Secondary | ICD-10-CM | POA: Diagnosis not present

## 2015-06-11 DIAGNOSIS — Z794 Long term (current) use of insulin: Secondary | ICD-10-CM | POA: Diagnosis not present

## 2015-06-11 DIAGNOSIS — I130 Hypertensive heart and chronic kidney disease with heart failure and stage 1 through stage 4 chronic kidney disease, or unspecified chronic kidney disease: Secondary | ICD-10-CM | POA: Diagnosis not present

## 2015-06-11 DIAGNOSIS — I69954 Hemiplegia and hemiparesis following unspecified cerebrovascular disease affecting left non-dominant side: Secondary | ICD-10-CM | POA: Diagnosis not present

## 2015-06-11 DIAGNOSIS — E785 Hyperlipidemia, unspecified: Secondary | ICD-10-CM | POA: Diagnosis not present

## 2015-06-13 DIAGNOSIS — R3915 Urgency of urination: Secondary | ICD-10-CM | POA: Diagnosis not present

## 2015-06-13 DIAGNOSIS — N189 Chronic kidney disease, unspecified: Secondary | ICD-10-CM | POA: Diagnosis not present

## 2015-06-13 DIAGNOSIS — N179 Acute kidney failure, unspecified: Secondary | ICD-10-CM | POA: Diagnosis not present

## 2015-06-13 DIAGNOSIS — Z794 Long term (current) use of insulin: Secondary | ICD-10-CM | POA: Diagnosis not present

## 2015-06-13 DIAGNOSIS — E1122 Type 2 diabetes mellitus with diabetic chronic kidney disease: Secondary | ICD-10-CM | POA: Diagnosis not present

## 2015-06-13 DIAGNOSIS — R0602 Shortness of breath: Secondary | ICD-10-CM | POA: Diagnosis not present

## 2015-06-13 DIAGNOSIS — R067 Sneezing: Secondary | ICD-10-CM | POA: Diagnosis not present

## 2015-06-13 DIAGNOSIS — G319 Degenerative disease of nervous system, unspecified: Secondary | ICD-10-CM | POA: Diagnosis not present

## 2015-06-13 DIAGNOSIS — I2510 Atherosclerotic heart disease of native coronary artery without angina pectoris: Secondary | ICD-10-CM | POA: Diagnosis not present

## 2015-06-13 DIAGNOSIS — I4519 Other right bundle-branch block: Secondary | ICD-10-CM | POA: Diagnosis not present

## 2015-06-13 DIAGNOSIS — I5032 Chronic diastolic (congestive) heart failure: Secondary | ICD-10-CM | POA: Diagnosis not present

## 2015-06-13 DIAGNOSIS — I6789 Other cerebrovascular disease: Secondary | ICD-10-CM | POA: Diagnosis not present

## 2015-06-13 DIAGNOSIS — R531 Weakness: Secondary | ICD-10-CM | POA: Diagnosis not present

## 2015-06-13 DIAGNOSIS — I10 Essential (primary) hypertension: Secondary | ICD-10-CM | POA: Diagnosis not present

## 2015-06-13 DIAGNOSIS — I130 Hypertensive heart and chronic kidney disease with heart failure and stage 1 through stage 4 chronic kidney disease, or unspecified chronic kidney disease: Secondary | ICD-10-CM | POA: Diagnosis not present

## 2015-06-13 DIAGNOSIS — M79642 Pain in left hand: Secondary | ICD-10-CM | POA: Diagnosis not present

## 2015-06-13 DIAGNOSIS — R930 Abnormal findings on diagnostic imaging of skull and head, not elsewhere classified: Secondary | ICD-10-CM | POA: Diagnosis not present

## 2015-06-13 DIAGNOSIS — I444 Left anterior fascicular block: Secondary | ICD-10-CM | POA: Diagnosis not present

## 2015-06-13 DIAGNOSIS — E11319 Type 2 diabetes mellitus with unspecified diabetic retinopathy without macular edema: Secondary | ICD-10-CM | POA: Diagnosis not present

## 2015-06-13 DIAGNOSIS — I639 Cerebral infarction, unspecified: Secondary | ICD-10-CM | POA: Diagnosis not present

## 2015-06-14 ENCOUNTER — Ambulatory Visit: Payer: Commercial Managed Care - HMO | Admitting: Family Medicine

## 2015-06-14 DIAGNOSIS — N179 Acute kidney failure, unspecified: Secondary | ICD-10-CM | POA: Diagnosis not present

## 2015-06-14 DIAGNOSIS — I69854 Hemiplegia and hemiparesis following other cerebrovascular disease affecting left non-dominant side: Secondary | ICD-10-CM | POA: Diagnosis not present

## 2015-06-14 DIAGNOSIS — R531 Weakness: Secondary | ICD-10-CM | POA: Diagnosis not present

## 2015-06-14 DIAGNOSIS — I10 Essential (primary) hypertension: Secondary | ICD-10-CM | POA: Diagnosis not present

## 2015-06-15 ENCOUNTER — Ambulatory Visit: Payer: Commercial Managed Care - HMO | Admitting: Cardiovascular Disease

## 2015-06-15 DIAGNOSIS — G4733 Obstructive sleep apnea (adult) (pediatric): Secondary | ICD-10-CM | POA: Diagnosis not present

## 2015-06-15 DIAGNOSIS — I110 Hypertensive heart disease with heart failure: Secondary | ICD-10-CM | POA: Diagnosis not present

## 2015-06-15 DIAGNOSIS — E11319 Type 2 diabetes mellitus with unspecified diabetic retinopathy without macular edema: Secondary | ICD-10-CM | POA: Diagnosis not present

## 2015-06-15 DIAGNOSIS — N179 Acute kidney failure, unspecified: Secondary | ICD-10-CM | POA: Diagnosis not present

## 2015-06-15 DIAGNOSIS — Z794 Long term (current) use of insulin: Secondary | ICD-10-CM | POA: Diagnosis not present

## 2015-06-16 DIAGNOSIS — R488 Other symbolic dysfunctions: Secondary | ICD-10-CM | POA: Diagnosis not present

## 2015-06-16 DIAGNOSIS — G319 Degenerative disease of nervous system, unspecified: Secondary | ICD-10-CM | POA: Diagnosis not present

## 2015-06-16 DIAGNOSIS — N189 Chronic kidney disease, unspecified: Secondary | ICD-10-CM | POA: Diagnosis not present

## 2015-06-16 DIAGNOSIS — I2510 Atherosclerotic heart disease of native coronary artery without angina pectoris: Secondary | ICD-10-CM | POA: Diagnosis not present

## 2015-06-16 DIAGNOSIS — I69954 Hemiplegia and hemiparesis following unspecified cerebrovascular disease affecting left non-dominant side: Secondary | ICD-10-CM | POA: Diagnosis not present

## 2015-06-16 DIAGNOSIS — I509 Heart failure, unspecified: Secondary | ICD-10-CM | POA: Diagnosis not present

## 2015-06-16 DIAGNOSIS — Z5189 Encounter for other specified aftercare: Secondary | ICD-10-CM | POA: Diagnosis not present

## 2015-06-16 DIAGNOSIS — I6330 Cerebral infarction due to thrombosis of unspecified cerebral artery: Secondary | ICD-10-CM | POA: Diagnosis not present

## 2015-06-16 DIAGNOSIS — M6281 Muscle weakness (generalized): Secondary | ICD-10-CM | POA: Diagnosis not present

## 2015-06-16 DIAGNOSIS — I639 Cerebral infarction, unspecified: Secondary | ICD-10-CM | POA: Diagnosis not present

## 2015-06-16 DIAGNOSIS — R531 Weakness: Secondary | ICD-10-CM | POA: Diagnosis not present

## 2015-06-16 DIAGNOSIS — I130 Hypertensive heart and chronic kidney disease with heart failure and stage 1 through stage 4 chronic kidney disease, or unspecified chronic kidney disease: Secondary | ICD-10-CM | POA: Diagnosis not present

## 2015-06-16 DIAGNOSIS — Z8673 Personal history of transient ischemic attack (TIA), and cerebral infarction without residual deficits: Secondary | ICD-10-CM | POA: Diagnosis not present

## 2015-06-16 DIAGNOSIS — N179 Acute kidney failure, unspecified: Secondary | ICD-10-CM | POA: Diagnosis not present

## 2015-06-16 DIAGNOSIS — I5032 Chronic diastolic (congestive) heart failure: Secondary | ICD-10-CM | POA: Diagnosis not present

## 2015-06-16 DIAGNOSIS — E1151 Type 2 diabetes mellitus with diabetic peripheral angiopathy without gangrene: Secondary | ICD-10-CM | POA: Diagnosis not present

## 2015-06-16 DIAGNOSIS — R279 Unspecified lack of coordination: Secondary | ICD-10-CM | POA: Diagnosis not present

## 2015-06-16 DIAGNOSIS — E11319 Type 2 diabetes mellitus with unspecified diabetic retinopathy without macular edema: Secondary | ICD-10-CM | POA: Diagnosis not present

## 2015-06-16 DIAGNOSIS — Z794 Long term (current) use of insulin: Secondary | ICD-10-CM | POA: Diagnosis not present

## 2015-06-16 DIAGNOSIS — I69354 Hemiplegia and hemiparesis following cerebral infarction affecting left non-dominant side: Secondary | ICD-10-CM | POA: Diagnosis not present

## 2015-06-16 DIAGNOSIS — R261 Paralytic gait: Secondary | ICD-10-CM | POA: Diagnosis not present

## 2015-06-16 DIAGNOSIS — E1122 Type 2 diabetes mellitus with diabetic chronic kidney disease: Secondary | ICD-10-CM | POA: Diagnosis not present

## 2015-06-17 DIAGNOSIS — Z794 Long term (current) use of insulin: Secondary | ICD-10-CM | POA: Diagnosis not present

## 2015-06-17 DIAGNOSIS — R531 Weakness: Secondary | ICD-10-CM | POA: Diagnosis not present

## 2015-06-17 DIAGNOSIS — I509 Heart failure, unspecified: Secondary | ICD-10-CM | POA: Diagnosis not present

## 2015-06-17 DIAGNOSIS — I2510 Atherosclerotic heart disease of native coronary artery without angina pectoris: Secondary | ICD-10-CM | POA: Diagnosis not present

## 2015-06-17 DIAGNOSIS — E1151 Type 2 diabetes mellitus with diabetic peripheral angiopathy without gangrene: Secondary | ICD-10-CM | POA: Diagnosis not present

## 2015-06-17 DIAGNOSIS — I69354 Hemiplegia and hemiparesis following cerebral infarction affecting left non-dominant side: Secondary | ICD-10-CM | POA: Diagnosis not present

## 2015-06-17 DIAGNOSIS — E11319 Type 2 diabetes mellitus with unspecified diabetic retinopathy without macular edema: Secondary | ICD-10-CM | POA: Diagnosis not present

## 2015-06-18 ENCOUNTER — Ambulatory Visit: Payer: Commercial Managed Care - HMO | Admitting: Cardiovascular Disease

## 2015-06-22 ENCOUNTER — Ambulatory Visit: Payer: Commercial Managed Care - HMO | Admitting: Family Medicine

## 2015-06-29 ENCOUNTER — Encounter: Payer: Self-pay | Admitting: *Deleted

## 2015-07-08 DIAGNOSIS — E11319 Type 2 diabetes mellitus with unspecified diabetic retinopathy without macular edema: Secondary | ICD-10-CM | POA: Diagnosis not present

## 2015-07-08 DIAGNOSIS — I639 Cerebral infarction, unspecified: Secondary | ICD-10-CM | POA: Diagnosis not present

## 2015-07-08 DIAGNOSIS — I509 Heart failure, unspecified: Secondary | ICD-10-CM | POA: Diagnosis not present

## 2015-07-08 DIAGNOSIS — Z794 Long term (current) use of insulin: Secondary | ICD-10-CM | POA: Diagnosis not present

## 2015-07-10 DIAGNOSIS — E785 Hyperlipidemia, unspecified: Secondary | ICD-10-CM | POA: Diagnosis not present

## 2015-07-10 DIAGNOSIS — I69354 Hemiplegia and hemiparesis following cerebral infarction affecting left non-dominant side: Secondary | ICD-10-CM | POA: Diagnosis not present

## 2015-07-10 DIAGNOSIS — N189 Chronic kidney disease, unspecified: Secondary | ICD-10-CM | POA: Diagnosis not present

## 2015-07-10 DIAGNOSIS — E11319 Type 2 diabetes mellitus with unspecified diabetic retinopathy without macular edema: Secondary | ICD-10-CM | POA: Diagnosis not present

## 2015-07-10 DIAGNOSIS — E1122 Type 2 diabetes mellitus with diabetic chronic kidney disease: Secondary | ICD-10-CM | POA: Diagnosis not present

## 2015-07-10 DIAGNOSIS — E1151 Type 2 diabetes mellitus with diabetic peripheral angiopathy without gangrene: Secondary | ICD-10-CM | POA: Diagnosis not present

## 2015-07-10 DIAGNOSIS — I2510 Atherosclerotic heart disease of native coronary artery without angina pectoris: Secondary | ICD-10-CM | POA: Diagnosis not present

## 2015-07-10 DIAGNOSIS — I509 Heart failure, unspecified: Secondary | ICD-10-CM | POA: Diagnosis not present

## 2015-07-10 DIAGNOSIS — I130 Hypertensive heart and chronic kidney disease with heart failure and stage 1 through stage 4 chronic kidney disease, or unspecified chronic kidney disease: Secondary | ICD-10-CM | POA: Diagnosis not present

## 2015-07-12 DIAGNOSIS — E113593 Type 2 diabetes mellitus with proliferative diabetic retinopathy without macular edema, bilateral: Secondary | ICD-10-CM | POA: Diagnosis not present

## 2015-07-12 DIAGNOSIS — R269 Unspecified abnormalities of gait and mobility: Secondary | ICD-10-CM | POA: Diagnosis not present

## 2015-07-12 DIAGNOSIS — I509 Heart failure, unspecified: Secondary | ICD-10-CM | POA: Diagnosis not present

## 2015-07-12 DIAGNOSIS — E669 Obesity, unspecified: Secondary | ICD-10-CM | POA: Diagnosis not present

## 2015-07-12 DIAGNOSIS — I69354 Hemiplegia and hemiparesis following cerebral infarction affecting left non-dominant side: Secondary | ICD-10-CM | POA: Diagnosis not present

## 2015-07-12 DIAGNOSIS — N189 Chronic kidney disease, unspecified: Secondary | ICD-10-CM | POA: Diagnosis not present

## 2015-07-12 DIAGNOSIS — N184 Chronic kidney disease, stage 4 (severe): Secondary | ICD-10-CM | POA: Diagnosis not present

## 2015-07-12 DIAGNOSIS — I639 Cerebral infarction, unspecified: Secondary | ICD-10-CM | POA: Diagnosis not present

## 2015-07-12 DIAGNOSIS — I2510 Atherosclerotic heart disease of native coronary artery without angina pectoris: Secondary | ICD-10-CM | POA: Diagnosis not present

## 2015-07-12 DIAGNOSIS — I130 Hypertensive heart and chronic kidney disease with heart failure and stage 1 through stage 4 chronic kidney disease, or unspecified chronic kidney disease: Secondary | ICD-10-CM | POA: Diagnosis not present

## 2015-07-12 DIAGNOSIS — E11319 Type 2 diabetes mellitus with unspecified diabetic retinopathy without macular edema: Secondary | ICD-10-CM | POA: Diagnosis not present

## 2015-07-12 DIAGNOSIS — Z79899 Other long term (current) drug therapy: Secondary | ICD-10-CM | POA: Diagnosis not present

## 2015-07-12 DIAGNOSIS — E1151 Type 2 diabetes mellitus with diabetic peripheral angiopathy without gangrene: Secondary | ICD-10-CM | POA: Diagnosis not present

## 2015-07-12 DIAGNOSIS — E1122 Type 2 diabetes mellitus with diabetic chronic kidney disease: Secondary | ICD-10-CM | POA: Diagnosis not present

## 2015-07-12 DIAGNOSIS — E785 Hyperlipidemia, unspecified: Secondary | ICD-10-CM | POA: Diagnosis not present

## 2015-07-13 DIAGNOSIS — E785 Hyperlipidemia, unspecified: Secondary | ICD-10-CM | POA: Diagnosis not present

## 2015-07-13 DIAGNOSIS — I130 Hypertensive heart and chronic kidney disease with heart failure and stage 1 through stage 4 chronic kidney disease, or unspecified chronic kidney disease: Secondary | ICD-10-CM | POA: Diagnosis not present

## 2015-07-13 DIAGNOSIS — E1151 Type 2 diabetes mellitus with diabetic peripheral angiopathy without gangrene: Secondary | ICD-10-CM | POA: Diagnosis not present

## 2015-07-13 DIAGNOSIS — I2510 Atherosclerotic heart disease of native coronary artery without angina pectoris: Secondary | ICD-10-CM | POA: Diagnosis not present

## 2015-07-13 DIAGNOSIS — E1122 Type 2 diabetes mellitus with diabetic chronic kidney disease: Secondary | ICD-10-CM | POA: Diagnosis not present

## 2015-07-13 DIAGNOSIS — I69354 Hemiplegia and hemiparesis following cerebral infarction affecting left non-dominant side: Secondary | ICD-10-CM | POA: Diagnosis not present

## 2015-07-13 DIAGNOSIS — E11319 Type 2 diabetes mellitus with unspecified diabetic retinopathy without macular edema: Secondary | ICD-10-CM | POA: Diagnosis not present

## 2015-07-13 DIAGNOSIS — N189 Chronic kidney disease, unspecified: Secondary | ICD-10-CM | POA: Diagnosis not present

## 2015-07-13 DIAGNOSIS — I509 Heart failure, unspecified: Secondary | ICD-10-CM | POA: Diagnosis not present

## 2015-07-14 DIAGNOSIS — I130 Hypertensive heart and chronic kidney disease with heart failure and stage 1 through stage 4 chronic kidney disease, or unspecified chronic kidney disease: Secondary | ICD-10-CM | POA: Diagnosis not present

## 2015-07-14 DIAGNOSIS — I69354 Hemiplegia and hemiparesis following cerebral infarction affecting left non-dominant side: Secondary | ICD-10-CM | POA: Diagnosis not present

## 2015-07-14 DIAGNOSIS — E1151 Type 2 diabetes mellitus with diabetic peripheral angiopathy without gangrene: Secondary | ICD-10-CM | POA: Diagnosis not present

## 2015-07-14 DIAGNOSIS — E785 Hyperlipidemia, unspecified: Secondary | ICD-10-CM | POA: Diagnosis not present

## 2015-07-14 DIAGNOSIS — E1122 Type 2 diabetes mellitus with diabetic chronic kidney disease: Secondary | ICD-10-CM | POA: Diagnosis not present

## 2015-07-14 DIAGNOSIS — I509 Heart failure, unspecified: Secondary | ICD-10-CM | POA: Diagnosis not present

## 2015-07-14 DIAGNOSIS — E11319 Type 2 diabetes mellitus with unspecified diabetic retinopathy without macular edema: Secondary | ICD-10-CM | POA: Diagnosis not present

## 2015-07-14 DIAGNOSIS — N189 Chronic kidney disease, unspecified: Secondary | ICD-10-CM | POA: Diagnosis not present

## 2015-07-14 DIAGNOSIS — I2510 Atherosclerotic heart disease of native coronary artery without angina pectoris: Secondary | ICD-10-CM | POA: Diagnosis not present

## 2015-07-15 DIAGNOSIS — I130 Hypertensive heart and chronic kidney disease with heart failure and stage 1 through stage 4 chronic kidney disease, or unspecified chronic kidney disease: Secondary | ICD-10-CM | POA: Diagnosis not present

## 2015-07-15 DIAGNOSIS — I509 Heart failure, unspecified: Secondary | ICD-10-CM | POA: Diagnosis not present

## 2015-07-15 DIAGNOSIS — E11319 Type 2 diabetes mellitus with unspecified diabetic retinopathy without macular edema: Secondary | ICD-10-CM | POA: Diagnosis not present

## 2015-07-15 DIAGNOSIS — I69354 Hemiplegia and hemiparesis following cerebral infarction affecting left non-dominant side: Secondary | ICD-10-CM | POA: Diagnosis not present

## 2015-07-15 DIAGNOSIS — N189 Chronic kidney disease, unspecified: Secondary | ICD-10-CM | POA: Diagnosis not present

## 2015-07-15 DIAGNOSIS — I2510 Atherosclerotic heart disease of native coronary artery without angina pectoris: Secondary | ICD-10-CM | POA: Diagnosis not present

## 2015-07-15 DIAGNOSIS — E1151 Type 2 diabetes mellitus with diabetic peripheral angiopathy without gangrene: Secondary | ICD-10-CM | POA: Diagnosis not present

## 2015-07-15 DIAGNOSIS — E1122 Type 2 diabetes mellitus with diabetic chronic kidney disease: Secondary | ICD-10-CM | POA: Diagnosis not present

## 2015-07-15 DIAGNOSIS — E785 Hyperlipidemia, unspecified: Secondary | ICD-10-CM | POA: Diagnosis not present

## 2015-07-16 DIAGNOSIS — I2510 Atherosclerotic heart disease of native coronary artery without angina pectoris: Secondary | ICD-10-CM | POA: Diagnosis not present

## 2015-07-16 DIAGNOSIS — I69354 Hemiplegia and hemiparesis following cerebral infarction affecting left non-dominant side: Secondary | ICD-10-CM | POA: Diagnosis not present

## 2015-07-16 DIAGNOSIS — I509 Heart failure, unspecified: Secondary | ICD-10-CM | POA: Diagnosis not present

## 2015-07-16 DIAGNOSIS — E785 Hyperlipidemia, unspecified: Secondary | ICD-10-CM | POA: Diagnosis not present

## 2015-07-16 DIAGNOSIS — E11319 Type 2 diabetes mellitus with unspecified diabetic retinopathy without macular edema: Secondary | ICD-10-CM | POA: Diagnosis not present

## 2015-07-16 DIAGNOSIS — I130 Hypertensive heart and chronic kidney disease with heart failure and stage 1 through stage 4 chronic kidney disease, or unspecified chronic kidney disease: Secondary | ICD-10-CM | POA: Diagnosis not present

## 2015-07-16 DIAGNOSIS — E1151 Type 2 diabetes mellitus with diabetic peripheral angiopathy without gangrene: Secondary | ICD-10-CM | POA: Diagnosis not present

## 2015-07-16 DIAGNOSIS — N189 Chronic kidney disease, unspecified: Secondary | ICD-10-CM | POA: Diagnosis not present

## 2015-07-16 DIAGNOSIS — E1122 Type 2 diabetes mellitus with diabetic chronic kidney disease: Secondary | ICD-10-CM | POA: Diagnosis not present

## 2015-07-19 DIAGNOSIS — E785 Hyperlipidemia, unspecified: Secondary | ICD-10-CM | POA: Diagnosis not present

## 2015-07-19 DIAGNOSIS — E1151 Type 2 diabetes mellitus with diabetic peripheral angiopathy without gangrene: Secondary | ICD-10-CM | POA: Diagnosis not present

## 2015-07-19 DIAGNOSIS — I130 Hypertensive heart and chronic kidney disease with heart failure and stage 1 through stage 4 chronic kidney disease, or unspecified chronic kidney disease: Secondary | ICD-10-CM | POA: Diagnosis not present

## 2015-07-19 DIAGNOSIS — N189 Chronic kidney disease, unspecified: Secondary | ICD-10-CM | POA: Diagnosis not present

## 2015-07-19 DIAGNOSIS — I2510 Atherosclerotic heart disease of native coronary artery without angina pectoris: Secondary | ICD-10-CM | POA: Diagnosis not present

## 2015-07-19 DIAGNOSIS — E11319 Type 2 diabetes mellitus with unspecified diabetic retinopathy without macular edema: Secondary | ICD-10-CM | POA: Diagnosis not present

## 2015-07-19 DIAGNOSIS — I509 Heart failure, unspecified: Secondary | ICD-10-CM | POA: Diagnosis not present

## 2015-07-19 DIAGNOSIS — I69354 Hemiplegia and hemiparesis following cerebral infarction affecting left non-dominant side: Secondary | ICD-10-CM | POA: Diagnosis not present

## 2015-07-19 DIAGNOSIS — E1122 Type 2 diabetes mellitus with diabetic chronic kidney disease: Secondary | ICD-10-CM | POA: Diagnosis not present

## 2015-07-21 DIAGNOSIS — N189 Chronic kidney disease, unspecified: Secondary | ICD-10-CM | POA: Diagnosis not present

## 2015-07-21 DIAGNOSIS — E785 Hyperlipidemia, unspecified: Secondary | ICD-10-CM | POA: Diagnosis not present

## 2015-07-21 DIAGNOSIS — I2510 Atherosclerotic heart disease of native coronary artery without angina pectoris: Secondary | ICD-10-CM | POA: Diagnosis not present

## 2015-07-21 DIAGNOSIS — I130 Hypertensive heart and chronic kidney disease with heart failure and stage 1 through stage 4 chronic kidney disease, or unspecified chronic kidney disease: Secondary | ICD-10-CM | POA: Diagnosis not present

## 2015-07-21 DIAGNOSIS — I509 Heart failure, unspecified: Secondary | ICD-10-CM | POA: Diagnosis not present

## 2015-07-21 DIAGNOSIS — E11319 Type 2 diabetes mellitus with unspecified diabetic retinopathy without macular edema: Secondary | ICD-10-CM | POA: Diagnosis not present

## 2015-07-21 DIAGNOSIS — I69354 Hemiplegia and hemiparesis following cerebral infarction affecting left non-dominant side: Secondary | ICD-10-CM | POA: Diagnosis not present

## 2015-07-21 DIAGNOSIS — E1151 Type 2 diabetes mellitus with diabetic peripheral angiopathy without gangrene: Secondary | ICD-10-CM | POA: Diagnosis not present

## 2015-07-21 DIAGNOSIS — E1122 Type 2 diabetes mellitus with diabetic chronic kidney disease: Secondary | ICD-10-CM | POA: Diagnosis not present

## 2015-07-22 DIAGNOSIS — E785 Hyperlipidemia, unspecified: Secondary | ICD-10-CM | POA: Diagnosis not present

## 2015-07-22 DIAGNOSIS — I509 Heart failure, unspecified: Secondary | ICD-10-CM | POA: Diagnosis not present

## 2015-07-22 DIAGNOSIS — E1151 Type 2 diabetes mellitus with diabetic peripheral angiopathy without gangrene: Secondary | ICD-10-CM | POA: Diagnosis not present

## 2015-07-22 DIAGNOSIS — E1122 Type 2 diabetes mellitus with diabetic chronic kidney disease: Secondary | ICD-10-CM | POA: Diagnosis not present

## 2015-07-22 DIAGNOSIS — E11319 Type 2 diabetes mellitus with unspecified diabetic retinopathy without macular edema: Secondary | ICD-10-CM | POA: Diagnosis not present

## 2015-07-22 DIAGNOSIS — I2510 Atherosclerotic heart disease of native coronary artery without angina pectoris: Secondary | ICD-10-CM | POA: Diagnosis not present

## 2015-07-22 DIAGNOSIS — I130 Hypertensive heart and chronic kidney disease with heart failure and stage 1 through stage 4 chronic kidney disease, or unspecified chronic kidney disease: Secondary | ICD-10-CM | POA: Diagnosis not present

## 2015-07-22 DIAGNOSIS — N189 Chronic kidney disease, unspecified: Secondary | ICD-10-CM | POA: Diagnosis not present

## 2015-07-22 DIAGNOSIS — I69354 Hemiplegia and hemiparesis following cerebral infarction affecting left non-dominant side: Secondary | ICD-10-CM | POA: Diagnosis not present

## 2015-07-23 ENCOUNTER — Ambulatory Visit (INDEPENDENT_AMBULATORY_CARE_PROVIDER_SITE_OTHER): Payer: Commercial Managed Care - HMO | Admitting: Cardiovascular Disease

## 2015-07-23 ENCOUNTER — Encounter: Payer: Self-pay | Admitting: Cardiovascular Disease

## 2015-07-23 VITALS — BP 127/58 | HR 69 | Ht 64.0 in | Wt 186.0 lb

## 2015-07-23 DIAGNOSIS — E785 Hyperlipidemia, unspecified: Secondary | ICD-10-CM | POA: Diagnosis not present

## 2015-07-23 DIAGNOSIS — I739 Peripheral vascular disease, unspecified: Secondary | ICD-10-CM

## 2015-07-23 DIAGNOSIS — I509 Heart failure, unspecified: Secondary | ICD-10-CM | POA: Diagnosis not present

## 2015-07-23 DIAGNOSIS — N189 Chronic kidney disease, unspecified: Secondary | ICD-10-CM | POA: Diagnosis not present

## 2015-07-23 DIAGNOSIS — I2510 Atherosclerotic heart disease of native coronary artery without angina pectoris: Secondary | ICD-10-CM | POA: Diagnosis not present

## 2015-07-23 DIAGNOSIS — I69354 Hemiplegia and hemiparesis following cerebral infarction affecting left non-dominant side: Secondary | ICD-10-CM | POA: Diagnosis not present

## 2015-07-23 DIAGNOSIS — I10 Essential (primary) hypertension: Secondary | ICD-10-CM

## 2015-07-23 DIAGNOSIS — R0609 Other forms of dyspnea: Secondary | ICD-10-CM | POA: Diagnosis not present

## 2015-07-23 DIAGNOSIS — E1122 Type 2 diabetes mellitus with diabetic chronic kidney disease: Secondary | ICD-10-CM | POA: Diagnosis not present

## 2015-07-23 DIAGNOSIS — E1151 Type 2 diabetes mellitus with diabetic peripheral angiopathy without gangrene: Secondary | ICD-10-CM | POA: Diagnosis not present

## 2015-07-23 DIAGNOSIS — E11319 Type 2 diabetes mellitus with unspecified diabetic retinopathy without macular edema: Secondary | ICD-10-CM | POA: Diagnosis not present

## 2015-07-23 DIAGNOSIS — I2583 Coronary atherosclerosis due to lipid rich plaque: Secondary | ICD-10-CM

## 2015-07-23 DIAGNOSIS — I130 Hypertensive heart and chronic kidney disease with heart failure and stage 1 through stage 4 chronic kidney disease, or unspecified chronic kidney disease: Secondary | ICD-10-CM | POA: Diagnosis not present

## 2015-07-23 NOTE — Assessment & Plan Note (Signed)
History of CAD status post coronary artery bypass grafting by Dr. Cyndia Bent approximately 6 years ago the LIMA to LAD, sequential vein to diagonal branch and obtuse marginal branch as well as a vein to the PDA. She had a Myoview performed 06/11/12 that showed lateral scar with moderate peri-infarct ischemia and another stress test recently performed 01/06/15 that showed no changes from her prior study. She denies chest pain or shortness of breath.

## 2015-07-23 NOTE — Progress Notes (Signed)
07/23/2015 [name] [name]   [birth year]  [id number]  Primary Physician Dicky Doe, MD Primary Cardiologist: Lorretta Harp MD Lupe Carney, [place]  HPI:  The patient is a very pleasant, this 82 year old, moderately overweight, married [place] American female, mother of 63, grandmother to 4 grandchildren who I last saw 12/22/14.[name] She has a history of CAD status post coronary artery bypass grafting x5 by Dr. Gilford Raid approximately 5 years ago with a LIMA to her LAD, sequential vein to a diagonal branch and obtuse marginal branch as well as a vein to the PDA. Her other problems include hypertension, hyperlipidemia and insulin-dependent diabetes. She had a Myoview performed in 2012 that showed lateral scar with mild superimposed ischemia though she denied any chest pain or shortness of breath. She also complained of claudication and Dopplers from our office showed a right ABI of 0.81 and a left of 0.98. She had a recent Myoview performed Jun 11, 2012, that showed similar findings to her prior study with lateral scar with moderate peri-infarct ischemia. Her major complaints are of daytime somnolence. Since I saw her one year ago she is seeing Cecilie Kicks registered nurse practitioner 12/20/12. She has had a stroke in the interim and is slowly rehabilitating. She also had an endovascular procedure on her left lower extremity for what sounds like critical limb ischemia by Dr. Lucky Cowboy in [place]. Since I saw her in November 2016 she continued to rehabilitate from her stroke however she was admitted to [facility] several weeks ago for dehydration. Her diuretic was stopped and restarted. She recently was discharged from a skilled nursing facility.  Current Outpatient Prescriptions  Medication Sig Dispense Refill  . ACCU-CHEK SOFTCLIX LANCETS lancets CHECK BLOOD SUGARS TWICE DAILY 200 each 12  . aspirin 81 MG tablet Take 81 mg by mouth daily.    . clopidogrel (PLAVIX) 75 MG tablet  TAKE 1 TABLET BY MOUTH EVERY DAY 90 tablet 3  . furosemide (LASIX) 40 MG tablet TAKE 2 TABLETS BY MOUTH EVERY MORNING FOR FLUID 60 tablet 6  . insulin aspart (NOVOLOG FLEXPEN) 100 UNIT/ML FlexPen Inject into the skin 3 (three) times daily with meals. Sliding Scale 2 units-6 units patient has list for 100-400 BS readings    . Insulin NPH Isophane & Regular (NOVOLIN 70/30 [place]) Inject 40-50 Units into the skin. 50 units every morning  40 units every evening    . Insulin Pen Needle (RELION SHORT PEN NEEDLES) 31G X 8 MM MISC 1 each by Does not apply route 2 (two) times daily. 50 each 11  . lisinopril (PRINIVIL,ZESTRIL) 40 MG tablet TAKE 1 TABLET DAILY 30 tablet 3  . polyethylene glycol (MIRALAX / GLYCOLAX) packet USE 1-2 PACKETS DAILY 60 packet 0  . pravastatin (PRAVACHOL) 40 MG tablet TAKE 1 TABLET BY MOUTH AT BEDTIME 30 tablet 3  . ranitidine (ZANTAC) 150 MG tablet Take 150 mg by mouth 2 (two) times daily.     No current facility-administered medications for this visit.    Not on File  Social History   Social History  . Marital Status: Single    Spouse Name: N/A  . Number of Children: N/A  . Years of Education: N/A   Occupational History  . Not on file.   Social History Main Topics  . Smoking status: Former Research scientist (life sciences)  . Smokeless tobacco: Never Used     Comment: 50 years ago  . Alcohol Use: No  . Drug Use: No  . Sexual  Activity: Not on file   Other Topics Concern  . Not on file   Social History Narrative     Review of Systems: General: negative for chills, fever, night sweats or weight changes.  Cardiovascular: negative for chest pain, dyspnea on exertion, edema, orthopnea, palpitations, paroxysmal nocturnal dyspnea or shortness of breath Dermatological: negative for rash Respiratory: negative for cough or wheezing Urologic: negative for hematuria Abdominal: negative for nausea, vomiting, diarrhea, bright red blood per rectum, melena, or hematemesis Neurologic: negative for  visual changes, syncope, or dizziness All other systems reviewed and are otherwise negative except as noted above.    Blood pressure 127/58, pulse 69, height 5\' 4"  (1.626 m), weight 186 lb (84.369 kg).  General appearance: alert, no distress and slowed mentation Neck: no adenopathy, no carotid bruit, no JVD, supple, symmetrical, trachea midline and thyroid not enlarged, symmetric, no tenderness/mass/nodules Lungs: clear to auscultation bilaterally Heart: regular rate and rhythm, S1, S2 normal, no murmur, click, rub or gallop Extremities: extremities normal, atraumatic, no cyanosis or edema  EKG normal sinus rhythm at 69 with left anterior fascicular block. Presently reviewed this EKG  ASSESSMENT AND PLAN:   CAD (coronary artery disease) History of CAD status post coronary artery bypass grafting by Dr. Cyndia Bent approximately 6 years ago the LIMA to LAD, sequential vein to diagonal branch and obtuse marginal branch as well as a vein to the PDA. She had a Myoview performed 06/11/12 that showed lateral scar with moderate peri-infarct ischemia and another stress test recently performed 01/06/15 that showed no changes from her prior study. She denies chest pain or shortness of breath.  HTN (hypertension) History of hypertension and blood pressure measures 126/49. She is on amlodipine, carvedilol and lisinopril. Continue current meds at current dosing  Hyperlipidemia with target LDL less than 70 History of hyperlipidemia on statin therapy with recent lipid profile performed 12/11/14 revealing an LDL of 86 and HDL of 37  Claudication in peripheral vascular disease History of peripheral arterial disease with claudication in the past. She really underwent a procedure by Dr. Lucky Cowboy in [facility] for what sounds like critical limb ischemia. She is currently for the most part nonambulatory and wheelchair bound because of a prior stroke      Lorretta Harp MD [facility] 07/23/2015 10:55 AM

## 2015-07-23 NOTE — Patient Instructions (Signed)

## 2015-07-23 NOTE — Assessment & Plan Note (Signed)
History of hypertension and blood pressure measures 126/49. She is on amlodipine, carvedilol and lisinopril. Continue current meds at current dosing

## 2015-07-23 NOTE — Assessment & Plan Note (Signed)
History of hyperlipidemia on statin therapy with recent lipid profile performed 12/11/14 revealing an LDL of 86 and HDL of 37

## 2015-07-23 NOTE — Assessment & Plan Note (Signed)
History of peripheral arterial disease with claudication in the past. She really underwent a procedure by Dr. Lucky Cowboy in [facility] for what sounds like critical limb ischemia. She is currently for the most part nonambulatory and wheelchair bound because of a prior stroke

## 2015-07-26 DIAGNOSIS — E785 Hyperlipidemia, unspecified: Secondary | ICD-10-CM | POA: Diagnosis not present

## 2015-07-26 DIAGNOSIS — E1122 Type 2 diabetes mellitus with diabetic chronic kidney disease: Secondary | ICD-10-CM | POA: Diagnosis not present

## 2015-07-26 DIAGNOSIS — E11319 Type 2 diabetes mellitus with unspecified diabetic retinopathy without macular edema: Secondary | ICD-10-CM | POA: Diagnosis not present

## 2015-07-26 DIAGNOSIS — I509 Heart failure, unspecified: Secondary | ICD-10-CM | POA: Diagnosis not present

## 2015-07-26 DIAGNOSIS — I130 Hypertensive heart and chronic kidney disease with heart failure and stage 1 through stage 4 chronic kidney disease, or unspecified chronic kidney disease: Secondary | ICD-10-CM | POA: Diagnosis not present

## 2015-07-26 DIAGNOSIS — N189 Chronic kidney disease, unspecified: Secondary | ICD-10-CM | POA: Diagnosis not present

## 2015-07-26 DIAGNOSIS — E1151 Type 2 diabetes mellitus with diabetic peripheral angiopathy without gangrene: Secondary | ICD-10-CM | POA: Diagnosis not present

## 2015-07-26 DIAGNOSIS — I69354 Hemiplegia and hemiparesis following cerebral infarction affecting left non-dominant side: Secondary | ICD-10-CM | POA: Diagnosis not present

## 2015-07-26 DIAGNOSIS — I2510 Atherosclerotic heart disease of native coronary artery without angina pectoris: Secondary | ICD-10-CM | POA: Diagnosis not present

## 2015-07-27 DIAGNOSIS — E785 Hyperlipidemia, unspecified: Secondary | ICD-10-CM | POA: Diagnosis not present

## 2015-07-27 DIAGNOSIS — N189 Chronic kidney disease, unspecified: Secondary | ICD-10-CM | POA: Diagnosis not present

## 2015-07-27 DIAGNOSIS — E1151 Type 2 diabetes mellitus with diabetic peripheral angiopathy without gangrene: Secondary | ICD-10-CM | POA: Diagnosis not present

## 2015-07-27 DIAGNOSIS — I130 Hypertensive heart and chronic kidney disease with heart failure and stage 1 through stage 4 chronic kidney disease, or unspecified chronic kidney disease: Secondary | ICD-10-CM | POA: Diagnosis not present

## 2015-07-27 DIAGNOSIS — I69354 Hemiplegia and hemiparesis following cerebral infarction affecting left non-dominant side: Secondary | ICD-10-CM | POA: Diagnosis not present

## 2015-07-27 DIAGNOSIS — E11319 Type 2 diabetes mellitus with unspecified diabetic retinopathy without macular edema: Secondary | ICD-10-CM | POA: Diagnosis not present

## 2015-07-27 DIAGNOSIS — E1122 Type 2 diabetes mellitus with diabetic chronic kidney disease: Secondary | ICD-10-CM | POA: Diagnosis not present

## 2015-07-27 DIAGNOSIS — I509 Heart failure, unspecified: Secondary | ICD-10-CM | POA: Diagnosis not present

## 2015-07-27 DIAGNOSIS — I2510 Atherosclerotic heart disease of native coronary artery without angina pectoris: Secondary | ICD-10-CM | POA: Diagnosis not present

## 2015-07-28 DIAGNOSIS — E1122 Type 2 diabetes mellitus with diabetic chronic kidney disease: Secondary | ICD-10-CM | POA: Diagnosis not present

## 2015-07-28 DIAGNOSIS — I69354 Hemiplegia and hemiparesis following cerebral infarction affecting left non-dominant side: Secondary | ICD-10-CM | POA: Diagnosis not present

## 2015-07-28 DIAGNOSIS — E11319 Type 2 diabetes mellitus with unspecified diabetic retinopathy without macular edema: Secondary | ICD-10-CM | POA: Diagnosis not present

## 2015-07-28 DIAGNOSIS — I130 Hypertensive heart and chronic kidney disease with heart failure and stage 1 through stage 4 chronic kidney disease, or unspecified chronic kidney disease: Secondary | ICD-10-CM | POA: Diagnosis not present

## 2015-07-28 DIAGNOSIS — N189 Chronic kidney disease, unspecified: Secondary | ICD-10-CM | POA: Diagnosis not present

## 2015-07-28 DIAGNOSIS — E785 Hyperlipidemia, unspecified: Secondary | ICD-10-CM | POA: Diagnosis not present

## 2015-07-28 DIAGNOSIS — E1151 Type 2 diabetes mellitus with diabetic peripheral angiopathy without gangrene: Secondary | ICD-10-CM | POA: Diagnosis not present

## 2015-07-28 DIAGNOSIS — I2510 Atherosclerotic heart disease of native coronary artery without angina pectoris: Secondary | ICD-10-CM | POA: Diagnosis not present

## 2015-07-28 DIAGNOSIS — I509 Heart failure, unspecified: Secondary | ICD-10-CM | POA: Diagnosis not present

## 2015-07-30 DIAGNOSIS — I2510 Atherosclerotic heart disease of native coronary artery without angina pectoris: Secondary | ICD-10-CM | POA: Diagnosis not present

## 2015-07-30 DIAGNOSIS — E1151 Type 2 diabetes mellitus with diabetic peripheral angiopathy without gangrene: Secondary | ICD-10-CM | POA: Diagnosis not present

## 2015-07-30 DIAGNOSIS — I130 Hypertensive heart and chronic kidney disease with heart failure and stage 1 through stage 4 chronic kidney disease, or unspecified chronic kidney disease: Secondary | ICD-10-CM | POA: Diagnosis not present

## 2015-07-30 DIAGNOSIS — E11319 Type 2 diabetes mellitus with unspecified diabetic retinopathy without macular edema: Secondary | ICD-10-CM | POA: Diagnosis not present

## 2015-07-30 DIAGNOSIS — E785 Hyperlipidemia, unspecified: Secondary | ICD-10-CM | POA: Diagnosis not present

## 2015-07-30 DIAGNOSIS — I69354 Hemiplegia and hemiparesis following cerebral infarction affecting left non-dominant side: Secondary | ICD-10-CM | POA: Diagnosis not present

## 2015-07-30 DIAGNOSIS — E1122 Type 2 diabetes mellitus with diabetic chronic kidney disease: Secondary | ICD-10-CM | POA: Diagnosis not present

## 2015-07-30 DIAGNOSIS — I509 Heart failure, unspecified: Secondary | ICD-10-CM | POA: Diagnosis not present

## 2015-07-30 DIAGNOSIS — N189 Chronic kidney disease, unspecified: Secondary | ICD-10-CM | POA: Diagnosis not present

## 2015-08-02 DIAGNOSIS — I2510 Atherosclerotic heart disease of native coronary artery without angina pectoris: Secondary | ICD-10-CM | POA: Diagnosis not present

## 2015-08-02 DIAGNOSIS — I69354 Hemiplegia and hemiparesis following cerebral infarction affecting left non-dominant side: Secondary | ICD-10-CM | POA: Diagnosis not present

## 2015-08-02 DIAGNOSIS — E1122 Type 2 diabetes mellitus with diabetic chronic kidney disease: Secondary | ICD-10-CM | POA: Diagnosis not present

## 2015-08-02 DIAGNOSIS — I130 Hypertensive heart and chronic kidney disease with heart failure and stage 1 through stage 4 chronic kidney disease, or unspecified chronic kidney disease: Secondary | ICD-10-CM | POA: Diagnosis not present

## 2015-08-02 DIAGNOSIS — I509 Heart failure, unspecified: Secondary | ICD-10-CM | POA: Diagnosis not present

## 2015-08-02 DIAGNOSIS — E11319 Type 2 diabetes mellitus with unspecified diabetic retinopathy without macular edema: Secondary | ICD-10-CM | POA: Diagnosis not present

## 2015-08-02 DIAGNOSIS — N189 Chronic kidney disease, unspecified: Secondary | ICD-10-CM | POA: Diagnosis not present

## 2015-08-02 DIAGNOSIS — E785 Hyperlipidemia, unspecified: Secondary | ICD-10-CM | POA: Diagnosis not present

## 2015-08-02 DIAGNOSIS — E1151 Type 2 diabetes mellitus with diabetic peripheral angiopathy without gangrene: Secondary | ICD-10-CM | POA: Diagnosis not present

## 2015-08-03 DIAGNOSIS — I130 Hypertensive heart and chronic kidney disease with heart failure and stage 1 through stage 4 chronic kidney disease, or unspecified chronic kidney disease: Secondary | ICD-10-CM | POA: Diagnosis not present

## 2015-08-03 DIAGNOSIS — E11319 Type 2 diabetes mellitus with unspecified diabetic retinopathy without macular edema: Secondary | ICD-10-CM | POA: Diagnosis not present

## 2015-08-03 DIAGNOSIS — I2510 Atherosclerotic heart disease of native coronary artery without angina pectoris: Secondary | ICD-10-CM | POA: Diagnosis not present

## 2015-08-03 DIAGNOSIS — E785 Hyperlipidemia, unspecified: Secondary | ICD-10-CM | POA: Diagnosis not present

## 2015-08-03 DIAGNOSIS — I509 Heart failure, unspecified: Secondary | ICD-10-CM | POA: Diagnosis not present

## 2015-08-03 DIAGNOSIS — I69354 Hemiplegia and hemiparesis following cerebral infarction affecting left non-dominant side: Secondary | ICD-10-CM | POA: Diagnosis not present

## 2015-08-03 DIAGNOSIS — N189 Chronic kidney disease, unspecified: Secondary | ICD-10-CM | POA: Diagnosis not present

## 2015-08-03 DIAGNOSIS — E1122 Type 2 diabetes mellitus with diabetic chronic kidney disease: Secondary | ICD-10-CM | POA: Diagnosis not present

## 2015-08-03 DIAGNOSIS — E1151 Type 2 diabetes mellitus with diabetic peripheral angiopathy without gangrene: Secondary | ICD-10-CM | POA: Diagnosis not present

## 2015-08-04 ENCOUNTER — Other Ambulatory Visit: Payer: Self-pay | Admitting: Family Medicine

## 2015-08-04 DIAGNOSIS — E785 Hyperlipidemia, unspecified: Secondary | ICD-10-CM | POA: Diagnosis not present

## 2015-08-04 DIAGNOSIS — E1151 Type 2 diabetes mellitus with diabetic peripheral angiopathy without gangrene: Secondary | ICD-10-CM | POA: Diagnosis not present

## 2015-08-04 DIAGNOSIS — I69354 Hemiplegia and hemiparesis following cerebral infarction affecting left non-dominant side: Secondary | ICD-10-CM | POA: Diagnosis not present

## 2015-08-04 DIAGNOSIS — N189 Chronic kidney disease, unspecified: Secondary | ICD-10-CM | POA: Diagnosis not present

## 2015-08-04 DIAGNOSIS — I130 Hypertensive heart and chronic kidney disease with heart failure and stage 1 through stage 4 chronic kidney disease, or unspecified chronic kidney disease: Secondary | ICD-10-CM | POA: Diagnosis not present

## 2015-08-04 DIAGNOSIS — I2510 Atherosclerotic heart disease of native coronary artery without angina pectoris: Secondary | ICD-10-CM | POA: Diagnosis not present

## 2015-08-04 DIAGNOSIS — I509 Heart failure, unspecified: Secondary | ICD-10-CM | POA: Diagnosis not present

## 2015-08-04 DIAGNOSIS — E1122 Type 2 diabetes mellitus with diabetic chronic kidney disease: Secondary | ICD-10-CM | POA: Diagnosis not present

## 2015-08-04 DIAGNOSIS — E11319 Type 2 diabetes mellitus with unspecified diabetic retinopathy without macular edema: Secondary | ICD-10-CM | POA: Diagnosis not present

## 2015-08-04 NOTE — Telephone Encounter (Signed)
Pharmacy- check to see if patient has a new PCP.

## 2015-08-05 DIAGNOSIS — N189 Chronic kidney disease, unspecified: Secondary | ICD-10-CM | POA: Diagnosis not present

## 2015-08-05 DIAGNOSIS — I69354 Hemiplegia and hemiparesis following cerebral infarction affecting left non-dominant side: Secondary | ICD-10-CM | POA: Diagnosis not present

## 2015-08-05 DIAGNOSIS — I130 Hypertensive heart and chronic kidney disease with heart failure and stage 1 through stage 4 chronic kidney disease, or unspecified chronic kidney disease: Secondary | ICD-10-CM | POA: Diagnosis not present

## 2015-08-05 DIAGNOSIS — I509 Heart failure, unspecified: Secondary | ICD-10-CM | POA: Diagnosis not present

## 2015-08-05 DIAGNOSIS — E785 Hyperlipidemia, unspecified: Secondary | ICD-10-CM | POA: Diagnosis not present

## 2015-08-05 DIAGNOSIS — E113593 Type 2 diabetes mellitus with proliferative diabetic retinopathy without macular edema, bilateral: Secondary | ICD-10-CM | POA: Diagnosis not present

## 2015-08-05 DIAGNOSIS — E1122 Type 2 diabetes mellitus with diabetic chronic kidney disease: Secondary | ICD-10-CM | POA: Diagnosis not present

## 2015-08-05 DIAGNOSIS — R269 Unspecified abnormalities of gait and mobility: Secondary | ICD-10-CM | POA: Diagnosis not present

## 2015-08-05 DIAGNOSIS — E11319 Type 2 diabetes mellitus with unspecified diabetic retinopathy without macular edema: Secondary | ICD-10-CM | POA: Diagnosis not present

## 2015-08-05 DIAGNOSIS — I10 Essential (primary) hypertension: Secondary | ICD-10-CM | POA: Diagnosis not present

## 2015-08-05 DIAGNOSIS — E1151 Type 2 diabetes mellitus with diabetic peripheral angiopathy without gangrene: Secondary | ICD-10-CM | POA: Diagnosis not present

## 2015-08-05 DIAGNOSIS — I2510 Atherosclerotic heart disease of native coronary artery without angina pectoris: Secondary | ICD-10-CM | POA: Diagnosis not present

## 2015-08-06 DIAGNOSIS — I509 Heart failure, unspecified: Secondary | ICD-10-CM | POA: Diagnosis not present

## 2015-08-06 DIAGNOSIS — I69354 Hemiplegia and hemiparesis following cerebral infarction affecting left non-dominant side: Secondary | ICD-10-CM | POA: Diagnosis not present

## 2015-08-06 DIAGNOSIS — E785 Hyperlipidemia, unspecified: Secondary | ICD-10-CM | POA: Diagnosis not present

## 2015-08-06 DIAGNOSIS — I130 Hypertensive heart and chronic kidney disease with heart failure and stage 1 through stage 4 chronic kidney disease, or unspecified chronic kidney disease: Secondary | ICD-10-CM | POA: Diagnosis not present

## 2015-08-06 DIAGNOSIS — E11319 Type 2 diabetes mellitus with unspecified diabetic retinopathy without macular edema: Secondary | ICD-10-CM | POA: Diagnosis not present

## 2015-08-06 DIAGNOSIS — I2510 Atherosclerotic heart disease of native coronary artery without angina pectoris: Secondary | ICD-10-CM | POA: Diagnosis not present

## 2015-08-06 DIAGNOSIS — E1122 Type 2 diabetes mellitus with diabetic chronic kidney disease: Secondary | ICD-10-CM | POA: Diagnosis not present

## 2015-08-06 DIAGNOSIS — N189 Chronic kidney disease, unspecified: Secondary | ICD-10-CM | POA: Diagnosis not present

## 2015-08-06 DIAGNOSIS — E1151 Type 2 diabetes mellitus with diabetic peripheral angiopathy without gangrene: Secondary | ICD-10-CM | POA: Diagnosis not present

## 2015-08-09 DIAGNOSIS — E1151 Type 2 diabetes mellitus with diabetic peripheral angiopathy without gangrene: Secondary | ICD-10-CM | POA: Diagnosis not present

## 2015-08-09 DIAGNOSIS — I130 Hypertensive heart and chronic kidney disease with heart failure and stage 1 through stage 4 chronic kidney disease, or unspecified chronic kidney disease: Secondary | ICD-10-CM | POA: Diagnosis not present

## 2015-08-09 DIAGNOSIS — E11319 Type 2 diabetes mellitus with unspecified diabetic retinopathy without macular edema: Secondary | ICD-10-CM | POA: Diagnosis not present

## 2015-08-09 DIAGNOSIS — I509 Heart failure, unspecified: Secondary | ICD-10-CM | POA: Diagnosis not present

## 2015-08-09 DIAGNOSIS — E785 Hyperlipidemia, unspecified: Secondary | ICD-10-CM | POA: Diagnosis not present

## 2015-08-09 DIAGNOSIS — I69354 Hemiplegia and hemiparesis following cerebral infarction affecting left non-dominant side: Secondary | ICD-10-CM | POA: Diagnosis not present

## 2015-08-09 DIAGNOSIS — E1122 Type 2 diabetes mellitus with diabetic chronic kidney disease: Secondary | ICD-10-CM | POA: Diagnosis not present

## 2015-08-09 DIAGNOSIS — N189 Chronic kidney disease, unspecified: Secondary | ICD-10-CM | POA: Diagnosis not present

## 2015-08-09 DIAGNOSIS — I2510 Atherosclerotic heart disease of native coronary artery without angina pectoris: Secondary | ICD-10-CM | POA: Diagnosis not present

## 2015-08-10 DIAGNOSIS — I130 Hypertensive heart and chronic kidney disease with heart failure and stage 1 through stage 4 chronic kidney disease, or unspecified chronic kidney disease: Secondary | ICD-10-CM | POA: Diagnosis not present

## 2015-08-10 DIAGNOSIS — I509 Heart failure, unspecified: Secondary | ICD-10-CM | POA: Diagnosis not present

## 2015-08-10 DIAGNOSIS — E11319 Type 2 diabetes mellitus with unspecified diabetic retinopathy without macular edema: Secondary | ICD-10-CM | POA: Diagnosis not present

## 2015-08-10 DIAGNOSIS — E785 Hyperlipidemia, unspecified: Secondary | ICD-10-CM | POA: Diagnosis not present

## 2015-08-10 DIAGNOSIS — N189 Chronic kidney disease, unspecified: Secondary | ICD-10-CM | POA: Diagnosis not present

## 2015-08-10 DIAGNOSIS — I2510 Atherosclerotic heart disease of native coronary artery without angina pectoris: Secondary | ICD-10-CM | POA: Diagnosis not present

## 2015-08-10 DIAGNOSIS — E1151 Type 2 diabetes mellitus with diabetic peripheral angiopathy without gangrene: Secondary | ICD-10-CM | POA: Diagnosis not present

## 2015-08-10 DIAGNOSIS — I69354 Hemiplegia and hemiparesis following cerebral infarction affecting left non-dominant side: Secondary | ICD-10-CM | POA: Diagnosis not present

## 2015-08-10 DIAGNOSIS — E1122 Type 2 diabetes mellitus with diabetic chronic kidney disease: Secondary | ICD-10-CM | POA: Diagnosis not present

## 2015-08-11 DIAGNOSIS — E785 Hyperlipidemia, unspecified: Secondary | ICD-10-CM | POA: Diagnosis not present

## 2015-08-11 DIAGNOSIS — E1122 Type 2 diabetes mellitus with diabetic chronic kidney disease: Secondary | ICD-10-CM | POA: Diagnosis not present

## 2015-08-11 DIAGNOSIS — E1151 Type 2 diabetes mellitus with diabetic peripheral angiopathy without gangrene: Secondary | ICD-10-CM | POA: Diagnosis not present

## 2015-08-11 DIAGNOSIS — N189 Chronic kidney disease, unspecified: Secondary | ICD-10-CM | POA: Diagnosis not present

## 2015-08-11 DIAGNOSIS — I69354 Hemiplegia and hemiparesis following cerebral infarction affecting left non-dominant side: Secondary | ICD-10-CM | POA: Diagnosis not present

## 2015-08-11 DIAGNOSIS — I509 Heart failure, unspecified: Secondary | ICD-10-CM | POA: Diagnosis not present

## 2015-08-11 DIAGNOSIS — E11319 Type 2 diabetes mellitus with unspecified diabetic retinopathy without macular edema: Secondary | ICD-10-CM | POA: Diagnosis not present

## 2015-08-11 DIAGNOSIS — I130 Hypertensive heart and chronic kidney disease with heart failure and stage 1 through stage 4 chronic kidney disease, or unspecified chronic kidney disease: Secondary | ICD-10-CM | POA: Diagnosis not present

## 2015-08-11 DIAGNOSIS — I2510 Atherosclerotic heart disease of native coronary artery without angina pectoris: Secondary | ICD-10-CM | POA: Diagnosis not present

## 2015-08-12 DIAGNOSIS — I2510 Atherosclerotic heart disease of native coronary artery without angina pectoris: Secondary | ICD-10-CM | POA: Diagnosis not present

## 2015-08-12 DIAGNOSIS — E11319 Type 2 diabetes mellitus with unspecified diabetic retinopathy without macular edema: Secondary | ICD-10-CM | POA: Diagnosis not present

## 2015-08-12 DIAGNOSIS — E1151 Type 2 diabetes mellitus with diabetic peripheral angiopathy without gangrene: Secondary | ICD-10-CM | POA: Diagnosis not present

## 2015-08-12 DIAGNOSIS — N189 Chronic kidney disease, unspecified: Secondary | ICD-10-CM | POA: Diagnosis not present

## 2015-08-12 DIAGNOSIS — I130 Hypertensive heart and chronic kidney disease with heart failure and stage 1 through stage 4 chronic kidney disease, or unspecified chronic kidney disease: Secondary | ICD-10-CM | POA: Diagnosis not present

## 2015-08-12 DIAGNOSIS — I509 Heart failure, unspecified: Secondary | ICD-10-CM | POA: Diagnosis not present

## 2015-08-12 DIAGNOSIS — E1122 Type 2 diabetes mellitus with diabetic chronic kidney disease: Secondary | ICD-10-CM | POA: Diagnosis not present

## 2015-08-12 DIAGNOSIS — E785 Hyperlipidemia, unspecified: Secondary | ICD-10-CM | POA: Diagnosis not present

## 2015-08-12 DIAGNOSIS — I69354 Hemiplegia and hemiparesis following cerebral infarction affecting left non-dominant side: Secondary | ICD-10-CM | POA: Diagnosis not present

## 2015-08-16 DIAGNOSIS — N189 Chronic kidney disease, unspecified: Secondary | ICD-10-CM | POA: Diagnosis not present

## 2015-08-16 DIAGNOSIS — E1151 Type 2 diabetes mellitus with diabetic peripheral angiopathy without gangrene: Secondary | ICD-10-CM | POA: Diagnosis not present

## 2015-08-16 DIAGNOSIS — E1122 Type 2 diabetes mellitus with diabetic chronic kidney disease: Secondary | ICD-10-CM | POA: Diagnosis not present

## 2015-08-16 DIAGNOSIS — I509 Heart failure, unspecified: Secondary | ICD-10-CM | POA: Diagnosis not present

## 2015-08-16 DIAGNOSIS — E785 Hyperlipidemia, unspecified: Secondary | ICD-10-CM | POA: Diagnosis not present

## 2015-08-16 DIAGNOSIS — I69354 Hemiplegia and hemiparesis following cerebral infarction affecting left non-dominant side: Secondary | ICD-10-CM | POA: Diagnosis not present

## 2015-08-16 DIAGNOSIS — I2510 Atherosclerotic heart disease of native coronary artery without angina pectoris: Secondary | ICD-10-CM | POA: Diagnosis not present

## 2015-08-16 DIAGNOSIS — I130 Hypertensive heart and chronic kidney disease with heart failure and stage 1 through stage 4 chronic kidney disease, or unspecified chronic kidney disease: Secondary | ICD-10-CM | POA: Diagnosis not present

## 2015-08-16 DIAGNOSIS — E11319 Type 2 diabetes mellitus with unspecified diabetic retinopathy without macular edema: Secondary | ICD-10-CM | POA: Diagnosis not present

## 2015-08-19 ENCOUNTER — Encounter: Payer: Self-pay | Admitting: Family Medicine

## 2015-08-19 DIAGNOSIS — E1122 Type 2 diabetes mellitus with diabetic chronic kidney disease: Secondary | ICD-10-CM | POA: Diagnosis not present

## 2015-08-19 DIAGNOSIS — N189 Chronic kidney disease, unspecified: Secondary | ICD-10-CM | POA: Diagnosis not present

## 2015-08-19 DIAGNOSIS — E11319 Type 2 diabetes mellitus with unspecified diabetic retinopathy without macular edema: Secondary | ICD-10-CM | POA: Diagnosis not present

## 2015-08-19 DIAGNOSIS — I2510 Atherosclerotic heart disease of native coronary artery without angina pectoris: Secondary | ICD-10-CM | POA: Diagnosis not present

## 2015-08-19 DIAGNOSIS — I509 Heart failure, unspecified: Secondary | ICD-10-CM | POA: Diagnosis not present

## 2015-08-19 DIAGNOSIS — E1151 Type 2 diabetes mellitus with diabetic peripheral angiopathy without gangrene: Secondary | ICD-10-CM | POA: Diagnosis not present

## 2015-08-19 DIAGNOSIS — I130 Hypertensive heart and chronic kidney disease with heart failure and stage 1 through stage 4 chronic kidney disease, or unspecified chronic kidney disease: Secondary | ICD-10-CM | POA: Diagnosis not present

## 2015-08-19 DIAGNOSIS — I69354 Hemiplegia and hemiparesis following cerebral infarction affecting left non-dominant side: Secondary | ICD-10-CM | POA: Diagnosis not present

## 2015-08-19 DIAGNOSIS — E785 Hyperlipidemia, unspecified: Secondary | ICD-10-CM | POA: Diagnosis not present

## 2015-08-23 DIAGNOSIS — N189 Chronic kidney disease, unspecified: Secondary | ICD-10-CM | POA: Diagnosis not present

## 2015-08-23 DIAGNOSIS — E11319 Type 2 diabetes mellitus with unspecified diabetic retinopathy without macular edema: Secondary | ICD-10-CM | POA: Diagnosis not present

## 2015-08-23 DIAGNOSIS — E1151 Type 2 diabetes mellitus with diabetic peripheral angiopathy without gangrene: Secondary | ICD-10-CM | POA: Diagnosis not present

## 2015-08-23 DIAGNOSIS — E785 Hyperlipidemia, unspecified: Secondary | ICD-10-CM | POA: Diagnosis not present

## 2015-08-23 DIAGNOSIS — I2510 Atherosclerotic heart disease of native coronary artery without angina pectoris: Secondary | ICD-10-CM | POA: Diagnosis not present

## 2015-08-23 DIAGNOSIS — I509 Heart failure, unspecified: Secondary | ICD-10-CM | POA: Diagnosis not present

## 2015-08-23 DIAGNOSIS — I69354 Hemiplegia and hemiparesis following cerebral infarction affecting left non-dominant side: Secondary | ICD-10-CM | POA: Diagnosis not present

## 2015-08-23 DIAGNOSIS — I130 Hypertensive heart and chronic kidney disease with heart failure and stage 1 through stage 4 chronic kidney disease, or unspecified chronic kidney disease: Secondary | ICD-10-CM | POA: Diagnosis not present

## 2015-08-23 DIAGNOSIS — E1122 Type 2 diabetes mellitus with diabetic chronic kidney disease: Secondary | ICD-10-CM | POA: Diagnosis not present

## 2015-08-24 DIAGNOSIS — I739 Peripheral vascular disease, unspecified: Secondary | ICD-10-CM | POA: Diagnosis not present

## 2015-08-24 DIAGNOSIS — I6523 Occlusion and stenosis of bilateral carotid arteries: Secondary | ICD-10-CM | POA: Diagnosis not present

## 2015-08-24 DIAGNOSIS — E119 Type 2 diabetes mellitus without complications: Secondary | ICD-10-CM | POA: Diagnosis not present

## 2015-08-24 DIAGNOSIS — I639 Cerebral infarction, unspecified: Secondary | ICD-10-CM | POA: Diagnosis not present

## 2015-08-24 DIAGNOSIS — M79609 Pain in unspecified limb: Secondary | ICD-10-CM | POA: Diagnosis not present

## 2015-08-24 DIAGNOSIS — I6529 Occlusion and stenosis of unspecified carotid artery: Secondary | ICD-10-CM | POA: Diagnosis not present

## 2015-08-24 DIAGNOSIS — I70213 Atherosclerosis of native arteries of extremities with intermittent claudication, bilateral legs: Secondary | ICD-10-CM | POA: Diagnosis not present

## 2015-08-24 DIAGNOSIS — I10 Essential (primary) hypertension: Secondary | ICD-10-CM | POA: Diagnosis not present

## 2015-08-25 DIAGNOSIS — I509 Heart failure, unspecified: Secondary | ICD-10-CM | POA: Diagnosis not present

## 2015-08-25 DIAGNOSIS — E1122 Type 2 diabetes mellitus with diabetic chronic kidney disease: Secondary | ICD-10-CM | POA: Diagnosis not present

## 2015-08-25 DIAGNOSIS — I69354 Hemiplegia and hemiparesis following cerebral infarction affecting left non-dominant side: Secondary | ICD-10-CM | POA: Diagnosis not present

## 2015-08-25 DIAGNOSIS — E1151 Type 2 diabetes mellitus with diabetic peripheral angiopathy without gangrene: Secondary | ICD-10-CM | POA: Diagnosis not present

## 2015-08-25 DIAGNOSIS — I130 Hypertensive heart and chronic kidney disease with heart failure and stage 1 through stage 4 chronic kidney disease, or unspecified chronic kidney disease: Secondary | ICD-10-CM | POA: Diagnosis not present

## 2015-08-25 DIAGNOSIS — E785 Hyperlipidemia, unspecified: Secondary | ICD-10-CM | POA: Diagnosis not present

## 2015-08-25 DIAGNOSIS — I2510 Atherosclerotic heart disease of native coronary artery without angina pectoris: Secondary | ICD-10-CM | POA: Diagnosis not present

## 2015-08-25 DIAGNOSIS — E11319 Type 2 diabetes mellitus with unspecified diabetic retinopathy without macular edema: Secondary | ICD-10-CM | POA: Diagnosis not present

## 2015-08-25 DIAGNOSIS — N189 Chronic kidney disease, unspecified: Secondary | ICD-10-CM | POA: Diagnosis not present

## 2015-08-31 ENCOUNTER — Other Ambulatory Visit: Payer: Self-pay | Admitting: Family Medicine

## 2015-09-01 DIAGNOSIS — K5900 Constipation, unspecified: Secondary | ICD-10-CM | POA: Diagnosis not present

## 2015-09-01 DIAGNOSIS — Z951 Presence of aortocoronary bypass graft: Secondary | ICD-10-CM | POA: Diagnosis not present

## 2015-09-01 DIAGNOSIS — E119 Type 2 diabetes mellitus without complications: Secondary | ICD-10-CM | POA: Diagnosis not present

## 2015-09-01 DIAGNOSIS — Z7902 Long term (current) use of antithrombotics/antiplatelets: Secondary | ICD-10-CM | POA: Diagnosis not present

## 2015-09-01 DIAGNOSIS — Z8673 Personal history of transient ischemic attack (TIA), and cerebral infarction without residual deficits: Secondary | ICD-10-CM | POA: Diagnosis not present

## 2015-09-01 DIAGNOSIS — N189 Chronic kidney disease, unspecified: Secondary | ICD-10-CM | POA: Diagnosis not present

## 2015-09-01 DIAGNOSIS — I129 Hypertensive chronic kidney disease with stage 1 through stage 4 chronic kidney disease, or unspecified chronic kidney disease: Secondary | ICD-10-CM | POA: Diagnosis not present

## 2015-09-01 DIAGNOSIS — Z7982 Long term (current) use of aspirin: Secondary | ICD-10-CM | POA: Diagnosis not present

## 2015-09-01 DIAGNOSIS — I2510 Atherosclerotic heart disease of native coronary artery without angina pectoris: Secondary | ICD-10-CM | POA: Diagnosis not present

## 2015-09-10 DIAGNOSIS — N184 Chronic kidney disease, stage 4 (severe): Secondary | ICD-10-CM | POA: Diagnosis not present

## 2015-09-10 DIAGNOSIS — E113593 Type 2 diabetes mellitus with proliferative diabetic retinopathy without macular edema, bilateral: Secondary | ICD-10-CM | POA: Diagnosis not present

## 2015-09-10 DIAGNOSIS — I509 Heart failure, unspecified: Secondary | ICD-10-CM | POA: Diagnosis not present

## 2015-09-10 DIAGNOSIS — I10 Essential (primary) hypertension: Secondary | ICD-10-CM | POA: Diagnosis not present

## 2015-09-13 ENCOUNTER — Telehealth: Payer: Self-pay | Admitting: Family Medicine

## 2015-09-13 DIAGNOSIS — E133313 Other specified diabetes mellitus with moderate nonproliferative diabetic retinopathy with macular edema, bilateral: Secondary | ICD-10-CM | POA: Diagnosis not present

## 2015-09-13 NOTE — Telephone Encounter (Signed)
Pt has appt at Dr. Waynetta Sandy office today at 10:15 for OCT, mapping of retina.  Beth at Dr. Waynetta Sandy office  needs a [facility] referral.  Her call back number is [phone] and fax [phone]

## 2015-09-13 NOTE — Telephone Encounter (Signed)
Patient no longer under Dr. Luan Pulling care. [facility] notified.[place]

## 2015-09-14 DIAGNOSIS — N184 Chronic kidney disease, stage 4 (severe): Secondary | ICD-10-CM | POA: Diagnosis not present

## 2015-09-20 DIAGNOSIS — I10 Essential (primary) hypertension: Secondary | ICD-10-CM | POA: Diagnosis not present

## 2015-09-20 DIAGNOSIS — I509 Heart failure, unspecified: Secondary | ICD-10-CM | POA: Diagnosis not present

## 2015-09-20 DIAGNOSIS — E113593 Type 2 diabetes mellitus with proliferative diabetic retinopathy without macular edema, bilateral: Secondary | ICD-10-CM | POA: Diagnosis not present

## 2015-10-03 ENCOUNTER — Other Ambulatory Visit: Payer: Self-pay | Admitting: Family Medicine

## 2015-10-03 DIAGNOSIS — I5020 Unspecified systolic (congestive) heart failure: Secondary | ICD-10-CM

## 2015-10-03 DIAGNOSIS — R600 Localized edema: Secondary | ICD-10-CM

## 2015-10-18 DIAGNOSIS — R600 Localized edema: Secondary | ICD-10-CM | POA: Diagnosis not present

## 2015-10-18 DIAGNOSIS — N183 Chronic kidney disease, stage 3 (moderate): Secondary | ICD-10-CM | POA: Diagnosis not present

## 2015-10-18 DIAGNOSIS — E1129 Type 2 diabetes mellitus with other diabetic kidney complication: Secondary | ICD-10-CM | POA: Diagnosis not present

## 2015-10-18 DIAGNOSIS — I10 Essential (primary) hypertension: Secondary | ICD-10-CM | POA: Diagnosis not present

## 2015-10-29 DIAGNOSIS — I129 Hypertensive chronic kidney disease with stage 1 through stage 4 chronic kidney disease, or unspecified chronic kidney disease: Secondary | ICD-10-CM | POA: Diagnosis not present

## 2015-10-29 DIAGNOSIS — N189 Chronic kidney disease, unspecified: Secondary | ICD-10-CM | POA: Diagnosis not present

## 2015-10-29 DIAGNOSIS — Z8673 Personal history of transient ischemic attack (TIA), and cerebral infarction without residual deficits: Secondary | ICD-10-CM | POA: Diagnosis not present

## 2015-10-29 DIAGNOSIS — E1122 Type 2 diabetes mellitus with diabetic chronic kidney disease: Secondary | ICD-10-CM | POA: Diagnosis not present

## 2015-10-29 DIAGNOSIS — K644 Residual hemorrhoidal skin tags: Secondary | ICD-10-CM | POA: Diagnosis not present

## 2015-10-29 DIAGNOSIS — K5641 Fecal impaction: Secondary | ICD-10-CM | POA: Diagnosis not present

## 2015-10-29 DIAGNOSIS — K5900 Constipation, unspecified: Secondary | ICD-10-CM | POA: Diagnosis not present

## 2015-10-29 DIAGNOSIS — I2510 Atherosclerotic heart disease of native coronary artery without angina pectoris: Secondary | ICD-10-CM | POA: Diagnosis not present

## 2015-10-29 DIAGNOSIS — Z87891 Personal history of nicotine dependence: Secondary | ICD-10-CM | POA: Diagnosis not present

## 2015-10-29 DIAGNOSIS — K6289 Other specified diseases of anus and rectum: Secondary | ICD-10-CM | POA: Diagnosis not present

## 2015-11-04 DIAGNOSIS — I5033 Acute on chronic diastolic (congestive) heart failure: Secondary | ICD-10-CM | POA: Diagnosis not present

## 2015-11-04 DIAGNOSIS — R269 Unspecified abnormalities of gait and mobility: Secondary | ICD-10-CM | POA: Diagnosis not present

## 2015-11-04 DIAGNOSIS — E113593 Type 2 diabetes mellitus with proliferative diabetic retinopathy without macular edema, bilateral: Secondary | ICD-10-CM | POA: Diagnosis not present

## 2015-11-04 DIAGNOSIS — I10 Essential (primary) hypertension: Secondary | ICD-10-CM | POA: Diagnosis not present

## 2015-11-04 DIAGNOSIS — Z2821 Immunization not carried out because of patient refusal: Secondary | ICD-10-CM | POA: Diagnosis not present

## 2015-11-04 DIAGNOSIS — I639 Cerebral infarction, unspecified: Secondary | ICD-10-CM | POA: Diagnosis not present

## 2015-11-17 ENCOUNTER — Other Ambulatory Visit: Payer: Self-pay

## 2015-11-17 DIAGNOSIS — I639 Cerebral infarction, unspecified: Secondary | ICD-10-CM | POA: Diagnosis not present

## 2015-11-17 DIAGNOSIS — I5033 Acute on chronic diastolic (congestive) heart failure: Secondary | ICD-10-CM | POA: Diagnosis not present

## 2015-11-17 DIAGNOSIS — R269 Unspecified abnormalities of gait and mobility: Secondary | ICD-10-CM | POA: Diagnosis not present

## 2015-11-18 ENCOUNTER — Other Ambulatory Visit: Payer: Self-pay

## 2015-11-18 DIAGNOSIS — R531 Weakness: Secondary | ICD-10-CM | POA: Diagnosis not present

## 2015-11-18 NOTE — Patient Outreach (Signed)
[facility]) Care Management  11/18/2015  XICLALI ERDELY 07/06/1931 [id number]  Late entry for 11/17/15: Telephone call to patient regarding primary MD referral. Unable to reach. HIPAA compliant voice message left with call back phone number.   PLAN:  RNCM will attempt 2nd telephone call to patient within  1 week.   Quinn Plowman RN,BSN,CCM [facility] Telephonic  [phone]

## 2015-11-18 NOTE — Patient Outreach (Signed)
[facility]) Care Management  11/18/2015  [name] [name] 1932-01-03 [id number]   REFERRAL DATE: 11/12/15 REFERRAL SOURCE: EMMI prevent screening REFERRAL REASON: Diabetes, potential fall risk,  CONSENT: self/patient  PROVIDER:  Charlott Holler, NP - primary care provider Dr. Alvester Chou - Cardiology  SOCIAL: Patient states she lives with her 80 year old husband Has a sister in law that checks on her Has an aid that helps with her bath 3 times per week Patient states her husband drives her to her doctor appointments  SUBJECTIVE: Telephone call to patient regarding EMMI prevent referral.  HIPAA verified by patient. Discussed and offered [facility] care management services. Patient verbally agreed.  DIABETES: Patient states she checks her blood sugars 1 time a day. States todays fasting blood sugar was 188.  Patient states she misses checking her blood sugar.  Patient reports sometimes when she gets up it hard to check.  Patient unsure of A1c.    LEGS:  Patient states she went to see her doctor on yesterday 11/17/15 due to her legs swelling .  Patient states doctor adjusted her fluid pill.  Patient states she feels weak in her legs and it has become more and more difficulty for her to walk. Patient states she uses a walker but  Stays in a wheelchair most of the time.   FALLS:  Patient states she fell 09/09/15. Denies any injury. Patient states she was in the hospital sometime ago then was sent to a nursing home for 21-22 days. Unsure of specific timeframe.  Patient states she also had home health physical therapy once she was discharged. Patient states she still does not feel like she has recovered from this all of the way. Patient states her legs are still weak. Patient states she has to push her walker to the bathroom door then get out and use her walker to get to the toilet. Patient states she is moving slower. States it requires her a lot of time to go to the bathroom or get in the  car.   MEDICATION: Patient states she worries about her medications. Patient states sometimes they deliver her medication before she is ready and she ends up paying more. Patient states sometimes she is unable to see real good in the morning time.  Patient states she does not know what all of her medications are for. Patient reports being in the donut hole with her medicine.  Patient states her diabetic medication is $425.  Difficult for her to afford.   SELF CARE: Patient states she has an aid that helps to bath her 3 times a weak. Patient states she is unable to fully bath the other days. Patient states she just tries to do the best she can.   ASSESSMENT: history of CAD status post coronary artery bypass grafting x5 by Dr. Gilford Raid approximately 5 years ago with a LIMA to her LAD   PLAN:  Refer to community case manager and pharmacy  Quinn Plowman RN,BSN,CCM [facility] Telephonic  [phone]

## 2015-11-19 ENCOUNTER — Other Ambulatory Visit: Payer: Self-pay | Admitting: *Deleted

## 2015-11-19 NOTE — Patient Outreach (Signed)
[facility]) Care Management  11/19/2015  KELILAH CERAMI 26-May-1931 [id number]   Placed telephone call to patient regarding referral for community care coordinator. Unable to reach, able to leave a HIPAA compliant voice message with call back number.  Plan  Will  attempt 2nd telephone call to patient on next business day.  [name], RN, [place] Management [phone]- Mobile [phone]- Toll Free Main Office

## 2015-11-22 ENCOUNTER — Other Ambulatory Visit: Payer: Self-pay | Admitting: *Deleted

## 2015-11-22 NOTE — Patient Outreach (Addendum)
[facility]) Care Management  11/22/2015  [name] [name] [birth year] [id number]   Referral for Community care Coordinator Referral Reason: Diabetes Self care management and  Fall Risk  Placed call to [name] [name], HIPAA information verified. Patient reports she is doing pretty good on today. Explained purpose of call and patient agreeable to home visit and to provided additional information at that time.  [name] [name] states her blood sugar was a little high on today at 188. Patient reports that they receive meals on wheels.  Patient discussed that they have help with cleaning once a week and her sister in law helps her with bathing a few days a week. Patient discussed that she doesn't weight at home due to difficulty with balance.    Plan Will plan initial home visit within next  week for further assessment of needs.  [name], RN, [facility] Management [phone]- Mobile [phone]- Toll Free Main Office

## 2015-11-30 ENCOUNTER — Encounter: Payer: Self-pay | Admitting: *Deleted

## 2015-11-30 ENCOUNTER — Other Ambulatory Visit: Payer: Self-pay | Admitting: *Deleted

## 2015-11-30 NOTE — Patient Outreach (Addendum)
[place] [facility]) Care Management   11/30/2015  [name] [name] 1931/03/08 [id number]  [name] [name] is an 82 y.o. female  Subjective:  Patient discussed concern regarding swelling in her legs, and wants to know what to do to help states sometimes her husband cooks and she has foods with salt. Patient states she is unable to weigh at home due to difficulty standing to balance on scales   Patient discussed her recent slide from the bed  trying to get into bed while she was home alone, states she was not able to get up onto the bed well enough so she just slid down to her carpeted floor, she did not get hurt and called 911 to help her up.   Patient discussed she is able to check her blood sugar at least daily, and administers her insulin, reports difficulty seeing sometimes earlier in the morning but able to see well enough to dial up insulin on pen.  [name] [name] discussed the assistance that she has at home her sister in law helps with bath 3 days a week, and housekeeping services from [facility] for 1 1/2 hours a week per patient. [name] [name] states another agency should start seeing her on this week to help with therapy she believes but she is unable recall name of agency .  Objective:  BP (!) 148/60 (BP Location: Right Arm, Patient Position: Sitting)   Pulse 62   Resp 18   SpO2 98%  Patient sitting in wheel chair during visit feet hanging down .  Review of Systems  Constitutional: Negative.   HENT: Negative.   Eyes: Negative.   Respiratory: Negative.   Cardiovascular: Positive for leg swelling.       Bilateral lower extremity, legs and feet pitting edema  Gastrointestinal: Negative.   Genitourinary: Negative.   Musculoskeletal: Positive for falls.  Skin: Negative.   Neurological: Negative.        History of CVA, Noted weakness left hand   Endo/Heme/Allergies: Negative.   Psychiatric/Behavioral: Positive for memory loss.        Forgetful at times, unable to recall names     Physical Exam  Constitutional: She is oriented to person, place, and time. She appears well-developed and well-nourished.  Cardiovascular: Normal rate and normal heart sounds.   Respiratory: Effort normal.  GI: Soft.  Neurological: She is alert and oriented to person, place, and time.  Skin: Skin is warm and dry.  Psychiatric: She has a normal mood and affect. Her behavior is normal. Judgment and thought content normal.    Encounter Medications:   Outpatient Encounter Prescriptions as of 11/30/2015  Medication Sig Note  . ACCU-CHEK SOFTCLIX LANCETS lancets CHECK BLOOD SUGARS TWICE DAILY   . aspirin 81 MG tablet Take 81 mg by mouth daily.   . carvedilol (COREG) 6.25 MG tablet Take 6.25 mg by mouth 2 (two) times daily with a meal.   . clopidogrel (PLAVIX) 75 MG tablet TAKE 1 TABLET BY MOUTH EVERY DAY   . docusate sodium (COLACE) 100 MG capsule Take 100 mg by mouth 2 (two) times daily.   . furosemide (LASIX) 40 MG tablet TAKE 2 TABLETS BY MOUTH EVERY MORNING FOR FLUID 11/30/2015: 2 tablets in the morning and 2 tablets mid day   . Insulin NPH Isophane & Regular (NOVOLIN 70/30 [place]) Inject 40-50 Units into the skin. 50 units every morning  40 units every evening   . Insulin Pen Needle (RELION SHORT PEN NEEDLES) 31G X 8 MM  MISC 1 each by Does not apply route 2 (two) times daily.   [name] lisinopril (PRINIVIL,ZESTRIL) 40 MG tablet TAKE 1 TABLET DAILY   . polyethylene glycol (MIRALAX / GLYCOLAX) packet USE 1-2 PACKETS DAILY   . pravastatin (PRAVACHOL) 40 MG tablet TAKE 1 TABLET BY MOUTH AT BEDTIME   . ranitidine (ZANTAC) 150 MG tablet Take 150 mg by mouth 2 (two) times daily.   [name] senna (SENOKOT) 8.6 MG TABS tablet Take 2 tablets by mouth daily.   . insulin aspart (NOVOLOG FLEXPEN) 100 UNIT/ML FlexPen Inject into the skin 3 (three) times daily with meals. Sliding Scale 2 units-6 units patient has list for 100-400 BS readings  100-200, 2 units, 201-250, 4  units, 251--300 6 units, 301-350, 8 units, 351 to 400 10 units.    No facility-administered encounter medications on file as of 11/30/2015.     Functional Status:   In your present state of health, do you have any difficulty performing the following activities: 11/30/2015 11/22/2015  Hearing? N N  Vision? Y N  Difficulty concentrating or making decisions? Y N  Walking or climbing stairs? Y Y  Dressing or bathing? Y Y  Doing errands, shopping? [name] [name]  Preparing Food and eating ? Y Y  Using the Toilet? Y Y  In the past six months, have you accidently leaked urine? Y Y  Do you have problems with loss of bowel control? N N  Managing your Medications? Y N  Managing your Finances? [name] [name]  Housekeeping or managing your Housekeeping? [name] [name]  Some recent data might be hidden    Fall/Depression Screening:    PHQ 2/9 Scores 11/18/2015 02/23/2015 11/03/2014 07/28/2014  PHQ - 2 Score 1 0 0 0   Fall Risk  11/22/2015 02/23/2015 11/03/2014 07/28/2014  Falls in the past year? Yes No No No  Number falls in past yr: 1 - - -  Injury with Fall? No - - -  Risk for fall due to : History of fall(s) - - -  Follow up Falls prevention discussed - - -   Assessment:  Routine home visit, patient's spouse home during parts of visit. Patient sitting in wheel chair in kitchen area during the day, she has a lift chair but discussed she enjoys sitting in kitchen area, closer proximity to bathroom.  Diabetes - checking blood sugar at least daily, 30 day average 190.  Not taking sliding  scale insulin, due to fear of having a lower blood sugar and difficulty seeing lines  syringe well enough to draw up 2 units of insulin as she has a vial of insulin only. Will benefit from education on treating  Hypoglycemia. Reports being consistent with taking her 70/30 insulin as prescribed.    Leg Swelling - Unable to stand,balance of scales to weigh at home. Will benefit from continued education of Heart failure symptoms and action plan and  low salt diet education .  Patient not currently interested in   monitoring her blood pressure at home but her husband has a monitor available. Wearing shoes with a strap across, indents noted on feet,discussed elevating legs some during the day.  Falls- High fall Risk- will benefit from education on prevention strategies, and alert medical alert system.   Medication concerns - Will benefit from pharmacy follow up related to cost of medication, insulin $400 states she is in the donut hole. Patient has current medication list from her recent MD visit, and prefers to take medication from bottle daily than  using organizer, Nurse, adult in case she changed her mind, declined RN to fill at this visit states her daughter helps her at times.   Social- patient lives at home with her elderly husband that has limitations in assisting patient , discussed she has been thinking about Assisted Living but is not ready to check into it yet. Will benefit from consistent services as available, awaiting visit from agency to provide additional services at home.     Plan:  Placed call to Charlott Holler office regarding dosage of lasix patient is take and to notify that patient only taking 40 mg twice daily instead of 40 mg 2 tablets twice daily as listed prescription bottle filled after recent PCP visit.  Will discuss patient concerns regarding sliding scale insulin, able to leave a message on nurse line.   Will discuss patient concern with pharmacy  Care planning and goal setting at visit.  Provided and reviewed Heart Failure magnet, and low/high picture  salt food handout reviewed.  Will follow up with patient by telephone in the next 2 weeks, and home visit in the next month.    [facility] CM Care Plan Problem One   Flowsheet Row Most Recent Value  Care Plan Problem One  Self care knowledge deficit related to Diabetes   Role Documenting the Problem One  Care Management Coordinator  Care Plan for Problem One  Active   THN Long Term Goal (31-90 days)  Patient will report increased knowledge of managing diabetes in the next 60 day s  THN Long Term Goal Start Date  11/30/15  Interventions for Problem One Long Term Goal  Discussed importance of taking medications as prescribed, keeping all MD appointments   THN CM Short Term Goal #1 (0-30 days)  Patient will be able to state steps for treating hypoglycemia in the next 30 days   THN CM Short Term Goal #1 Start Date  11/30/15  Interventions for Short Term Goal #1  Provided and read to patient instructions on symptoms of hypoglycemia and rule of 15 for treating   THN CM Short Term Goal #2 (0-30 days)  Patient will be able to states at least 2 measurers to help in controlling diabetes.  THN CM Short Term Goal #2 Start Date  11/30/15  Interventions for Short Term Goal #2  Provided EMMI handout on controlling Diabetes and read to patient , will review with teachback at new visit     [facility] CM Care Plan Problem Two   Flowsheet Row Most Recent Value  Care Plan Problem Two  High fall risk   Role Documenting the Problem Two  Care Management [place] for Problem Two  Active  Interventions for Problem Two Long Term Goal   Discussed fall prevention strategies, reviewed handout in [facility] packet   THN Long Term Goal (31-90) days  Patient will be able to state increased knowledge of preventing falls in the next 30 days   THN Long Term Goal Start Date  11/30/15  THN CM Short Term Goal #1 (0-30 days)  Patient will not experience a fall in the next 30 days   THN CM Short Term Goal #1 Start Date  11/30/15  Interventions for Short Term Goal #2   Reviewed keeping items she uses frequently near by, locking wheelchair before standing, standing still a few minutes before starting to walk   THN CM Short Term Goal #2 (0-30 days)  Patient will have medical alert system in place in the next  30 days  THN CM Short Term Goal #2 Start Date  11/30/15  Interventions for Short Term Goal  #2  Patient will follow through with discussing with her son regarding service , placed call during to local agency to determine cost.   THN CM Short Term Goal #3 (0-30 days)  Patient will report doing walking in place exercises at least 5 minutes , 5 times a week in the next 30 days   THN CM Short Term Goal #3 Start Date  11/30/15  Interventions for Short Term Goal #3  Patient able to demonostrate walking in place exercises      [name], RN, [place] Management [phone]- Mobile [phone]- [place]

## 2015-12-02 ENCOUNTER — Other Ambulatory Visit: Payer: Self-pay | Admitting: *Deleted

## 2015-12-02 NOTE — Patient Outreach (Signed)
[place] [facility]) Care Management  12/02/2015  [name] [name] [birth year] [id number]  Received returned incoming call from , Jessica from [facility] office discussed that patient was noted at visit only taking Lasix 40 mg twice daily, rather than the updated prescription from recent office visit that reads Lasix 40 mg 2 tablets twice daily, confirmed. Informed that patient was educated on her how to take per recent prescription.  [name], RN, [place] Management [phone]- Mobile [phone]- Toll Free Main Office

## 2015-12-05 DIAGNOSIS — K5900 Constipation, unspecified: Secondary | ICD-10-CM | POA: Diagnosis not present

## 2015-12-05 DIAGNOSIS — Z7902 Long term (current) use of antithrombotics/antiplatelets: Secondary | ICD-10-CM | POA: Diagnosis not present

## 2015-12-05 DIAGNOSIS — I2510 Atherosclerotic heart disease of native coronary artery without angina pectoris: Secondary | ICD-10-CM | POA: Diagnosis not present

## 2015-12-05 DIAGNOSIS — Z951 Presence of aortocoronary bypass graft: Secondary | ICD-10-CM | POA: Diagnosis not present

## 2015-12-05 DIAGNOSIS — Z79899 Other long term (current) drug therapy: Secondary | ICD-10-CM | POA: Diagnosis not present

## 2015-12-05 DIAGNOSIS — I129 Hypertensive chronic kidney disease with stage 1 through stage 4 chronic kidney disease, or unspecified chronic kidney disease: Secondary | ICD-10-CM | POA: Diagnosis not present

## 2015-12-05 DIAGNOSIS — Z8673 Personal history of transient ischemic attack (TIA), and cerebral infarction without residual deficits: Secondary | ICD-10-CM | POA: Diagnosis not present

## 2015-12-05 DIAGNOSIS — N189 Chronic kidney disease, unspecified: Secondary | ICD-10-CM | POA: Diagnosis not present

## 2015-12-05 DIAGNOSIS — E1122 Type 2 diabetes mellitus with diabetic chronic kidney disease: Secondary | ICD-10-CM | POA: Diagnosis not present

## 2015-12-06 ENCOUNTER — Other Ambulatory Visit: Payer: Self-pay | Admitting: Pharmacist

## 2015-12-06 ENCOUNTER — Other Ambulatory Visit: Payer: Self-pay | Admitting: *Deleted

## 2015-12-06 NOTE — Patient Outreach (Signed)
Call to patient's PCP regarding patient's ongoing constipation. Speak with Janett Billow in [place] office. Let her know that patient reports: severe constipation for days. Reports that she went to the hospital yesterday and that she had an x-ray done and was given a bottle of magnesium citrate. Reports that she took the magnesium citrate as directed, but that her constipation is still not relieved. Reports that she is not sure whether she should go back to the hospital.   Janett Billow states that she will send a message to the patient's PCP now and that they will follow up with the patient directly. Janett Billow takes my number and lets me know that they will also give me a call back as well.  Will also place a coordination of care call to [place].  Harlow Asa, PharmD Clinical Pharmacist [place] Management [phone]

## 2015-12-06 NOTE — Patient Outreach (Signed)
Receive a call back from [place] in [place] office. Janett Billow reports that she received a response back from [place] directing to have the patient take a Dulcolax suppository today and to call PCP back in morning if her constipation has not resolved. Janett Billow states that she has called the patient and provided her with this instruction.  Harlow Asa, PharmD Clinical Pharmacist [place] Management [phone]

## 2015-12-06 NOTE — Patient Outreach (Signed)
[name] [name] is a 82 y.o. female referred to pharmacy for medication assistance related to the cost of her insulin.   Called and spoke with patient who reports that she has had severe constipation for days. Reports that she went to the hospital yesterday and that she had an x-ray done and was given a bottle of magnesium citrate. Reports that she took the magnesium citrate as directed, but that her constipation is still not relieved. Reports that she is not sure whether she should go back to the hospital. Patient reports that she has not called her PCP. Advise patient to follow up with PCP.  Will also call to follow up with patient's PCP now.   Harlow Asa, PharmD Clinical Pharmacist [place] Management [phone]

## 2015-12-06 NOTE — Patient Outreach (Signed)
[place] [facility]) Care Management  12/06/2015  CARRY STEBELTON Jun 22, 1931 [id number]   Nurse Call center follow up.  Placed call to [name] to follow up on call to nurse center calls,  HIPAA information verified. Patient discussed her problem with constipation, states she has taken bottle of mag citrate on this morning, miralax and dulcolax supp.  I have spoken with Tommy Rainwater [facility] pharmacist regarding her conversation with patient today and complaint constipation, and care coordination with PCP office that has taken place on today, patient has followed recommendations.  Patient reports that she is waiting on her bowels to move, passing little gas, denies nausea or vomiting, tolerating liquids and  foods, reports she ate a barbeque sandwich on today.   Discussed notifying MD in the am if she has not had a bowel movement.    Plan Will follow up with patient on tomorrow by telephone .    [name], RN, [place] Management [phone]- Mobile [phone]- Toll Free Main Office

## 2015-12-07 ENCOUNTER — Other Ambulatory Visit: Payer: Self-pay | Admitting: *Deleted

## 2015-12-07 NOTE — Patient Outreach (Addendum)
[place] [facility]) Care Management  12/07/2015  [name] [name] August 20, 1931 [id number]   Placed call to patient to follow up on Nurse line call on yesterday, and telephone call to patient on yesterday regarding being constipated, also patient has placed a call to nurse call line on today regarding being on the commode and needing assistance off, but her husband arrived per note.  Telephone line busy, Placed call to patient's son listed on [facility] consent, [name] Hassell Done, HIPAA information verified. He expressed concern regarding  increased need for assistance in the home, He discussed patient at times has left the telephone off the receiver and he is planning to visit her home in the next hour and also will  to check into safety alert button for patient .   Plan Will place social worker consult regarding available resources in the community to assist with care at home Will follow up with patient within a week or sooner.  [name], RN, [place] Management [phone]- Mobile [phone]- Toll Free Main Office .

## 2015-12-08 ENCOUNTER — Other Ambulatory Visit: Payer: Self-pay | Admitting: *Deleted

## 2015-12-08 ENCOUNTER — Encounter: Payer: Self-pay | Admitting: *Deleted

## 2015-12-08 NOTE — Patient Outreach (Signed)
[facility]) Care Management  12/08/2015  [name] [name] 03/21/1931 [id number]   Placed telephone call to patient to follow up, on her recent concern related to constipation and requesting more assistance in home , spoke with patient HIPAA verified. Patient reports she is starting to feel better on today, states her bowel have started to move.  [name] discussed that she thought that the doctor was going to order home therapy because is having a little more difficulty managing at home, and would benefit from therapy to help increase safety . Discussed with patient regarding arranging a sooner PCP office visit, as her next appointment is 11/17.  I offered to make appointment but patient states she would do it on her own .    Plan Placed call to Charlott Holler FNP office regarding concerns and patient question regarding therapy and to notify that [facility] social worker has been consulted to explore additional  resources in the community able to leave a message on [place] line.  Will follow up with patient by telephone in the next week,ensured that patient has my contact number if needed.    [name], RN, [place] Management [phone]- Mobile [phone]- Toll Free Main Office

## 2015-12-09 ENCOUNTER — Other Ambulatory Visit: Payer: Self-pay | Admitting: *Deleted

## 2015-12-09 DIAGNOSIS — I639 Cerebral infarction, unspecified: Secondary | ICD-10-CM | POA: Diagnosis not present

## 2015-12-09 DIAGNOSIS — I509 Heart failure, unspecified: Secondary | ICD-10-CM | POA: Diagnosis not present

## 2015-12-09 DIAGNOSIS — E113593 Type 2 diabetes mellitus with proliferative diabetic retinopathy without macular edema, bilateral: Secondary | ICD-10-CM | POA: Diagnosis not present

## 2015-12-09 DIAGNOSIS — Z6836 Body mass index (BMI) 36.0-36.9, adult: Secondary | ICD-10-CM | POA: Diagnosis not present

## 2015-12-09 DIAGNOSIS — R531 Weakness: Secondary | ICD-10-CM | POA: Diagnosis not present

## 2015-12-13 ENCOUNTER — Other Ambulatory Visit: Payer: Self-pay | Admitting: *Deleted

## 2015-12-13 ENCOUNTER — Other Ambulatory Visit: Payer: Self-pay | Admitting: Pharmacist

## 2015-12-13 NOTE — Patient Outreach (Signed)
[name] [name] is a 82 y.o. female referred to pharmacy for medication assistance related to the cost of her insulin. Spoke with patient last week and assisted with coordination of care for patient's complaint of ongoing constipation.  Call patient today. Phone rings and then is picked up, but no one answers. Call back and phone line is busy.  Called again and speak with patient. HIPAA identifiers verified and verbal consent received. Patient reports that she has been in the donut hole with her insurance and that her Novolin 70/30 insulin pens have been costing her over $400 for 5 boxes. Reports that she uses 50 units each morning and 40 units each evening. Patient reports that she currently has 3 boxes of pens remaining. Discuss with patient the coverage gap of her insurance and options to help with the cost of her medications. However, patient reports that it is hard for her to understand some of this information over the phone. Patient asks that I reach out to her daughter, [name] [name] [phone]) to discuss her medications and their cost. Let [name] know that I will give [name] [name] a call now.  Call patient's daughter, [name] [name]. [name] [name] reports that she has helped manage her mom's medications in the past. Reports that she does sometimes see her mom in the evening and helps to check on her insulin dosing. Discuss with [name] [name] the importance of patient's medication adherence, particularly for her insulin. [name] [name] states that she will continue to review this with her mom to see if she needs additional help. Discuss with [name] [name] the extra help application through social security. [name] [name] asks about having a paper copy to complete this with her mom. Provide [name] [name] with the phone number for social security for requesting the paper copy. Counsel [name] [name] about Medicare Part D, the coverage gap, and deductibles. Also advise [name] [name] about the opportunity for cost savings, particularly in the  new year, for the patient using mail order pharmacy. [name] verbalizes understanding. [name] [name] states that she has no further medication questions or concerns on behalf of the patient at this time. Provider [name] [name] with my phone number.  Will place a coordination of care call to [place]. Will close pharmacy episode at this time.  Harlow Asa, PharmD Clinical Pharmacist [place] Management [phone]

## 2015-12-13 NOTE — Patient Outreach (Addendum)
[place] [facility]) Care Management  12/13/2015  [name] [name] [birth year] [id number]  1130 Scheduled telephone assessment call placed to patient telephone rang unable to leave a message. [place] call to patient home again, line busy.   Plan Will await return call if no response  Will follow up with patient in the next week for scheduled visit.   1400 Received return call from [name] [name], patient reports that she is feeling some better on today, and yesterday was really good her leg felt so much better.  [name] [name] discussed her recent problem with constipation reports it has been about 3 days now since a bowel movement and she has taken her medication on today and feels as if her bowels will move.   Patient discussed that her sister in law helps her out a few hours a day, with getting her medicine together and getting clothes on, she has another lady from a agency that comes every Tuesday to do light housework, cleaning bathroom, kitchen.  Discussed with patient [facility] social worker has been consulted to assess for other community resources and patient discussed she feels she needs more assistance at home.    Plan Will plan home visit in the next week.   [name], RN, [place] Management [phone]- Mobile [phone]- Toll Free Main Office

## 2015-12-13 NOTE — Patient Outreach (Signed)
[facility]) Care Management  12/13/2015  [name] [name] 14-Jan-1932 [id number]   CSW reached out to patient and discussed CSW roles and reason for referral- patient would like a home visit to discuss community resources including [employer]. CSW will call patient on 11/17 to secure a day to visit and assess.       [name], MSW, [place] Worker  [place] [phone]

## 2015-12-17 ENCOUNTER — Other Ambulatory Visit: Payer: Self-pay | Admitting: Family Medicine

## 2015-12-21 ENCOUNTER — Other Ambulatory Visit: Payer: Self-pay | Admitting: *Deleted

## 2015-12-21 DIAGNOSIS — I639 Cerebral infarction, unspecified: Secondary | ICD-10-CM | POA: Diagnosis not present

## 2015-12-21 DIAGNOSIS — N184 Chronic kidney disease, stage 4 (severe): Secondary | ICD-10-CM | POA: Diagnosis not present

## 2015-12-21 DIAGNOSIS — I509 Heart failure, unspecified: Secondary | ICD-10-CM | POA: Diagnosis not present

## 2015-12-21 DIAGNOSIS — I10 Essential (primary) hypertension: Secondary | ICD-10-CM | POA: Diagnosis not present

## 2015-12-21 DIAGNOSIS — Z6835 Body mass index (BMI) 35.0-35.9, adult: Secondary | ICD-10-CM | POA: Diagnosis not present

## 2015-12-21 DIAGNOSIS — E113593 Type 2 diabetes mellitus with proliferative diabetic retinopathy without macular edema, bilateral: Secondary | ICD-10-CM | POA: Diagnosis not present

## 2015-12-21 DIAGNOSIS — R269 Unspecified abnormalities of gait and mobility: Secondary | ICD-10-CM | POA: Diagnosis not present

## 2015-12-21 DIAGNOSIS — R54 Age-related physical debility: Secondary | ICD-10-CM | POA: Diagnosis not present

## 2015-12-21 NOTE — Patient Outreach (Signed)
[facility]) Care Management   12/21/2015  [name] [name] Jun 20, 1931 [id number]  [name] [name] is an 82 y.o. female  Subjective:   [name] [name] discussed her recent [place] PCP patient, and concern for cost of novolog prescriptions she picked today states she rarely uses that insulin.  Patient has concern about having needing assistance in the home with help with daily care, she currently has her sister in law helping her at least 3 days a week and her time has become limited and she did not come today.  She also has [facility] for house[facility] services one day a week. Patient discussing that she realizes that she needs more help at home and her husband may not be able to help her patient discussed her attempt earlier this year to get into [place] program, and she discussed she may want to go to assisted living if she gets so she can't make it at home.   Patient reports her legs are a little less swollen she had lost 4 pounds at office visit on today, patient states she takes lasix twice daily as prescribed on most days. Patient states her sister in law helps her at times keeping her medication straight, patient states she gets a little mixed up sometimes taking her medications.     Objective:  BP 128/60   Pulse 82   Resp 18   SpO2 97%   Patient returning from MD visit on arrival husband assisted her with walking up ramp using her walker into the home.  Review of Systems  Constitutional: Negative.   HENT: Negative.   Eyes: Negative.   Respiratory: Negative.   Cardiovascular: Positive for leg swelling.  Gastrointestinal: Negative.   Genitourinary: Negative.   Musculoskeletal: Negative.   Skin: Negative.   Neurological: Positive for focal weakness.  Endo/Heme/Allergies: Negative.   Psychiatric/Behavioral:       Reports being forgetful at times     Physical Exam  Constitutional: She is oriented to person, place, and time.  She appears well-developed and well-nourished.  Cardiovascular: Normal rate, regular rhythm, normal heart sounds and intact distal pulses.   Respiratory: Effort normal and breath sounds normal.  GI: Soft.  Neurological: She is alert and oriented to person, place, and time.  Skin: Skin is warm and dry.  Psychiatric: She has a normal mood and affect. Her behavior is normal. Judgment and thought content normal.    Encounter Medications:   Outpatient Encounter Prescriptions as of 12/21/2015  Medication Sig Note  . ACCU-CHEK SOFTCLIX LANCETS lancets CHECK BLOOD SUGARS TWICE DAILY   . aspirin 81 MG tablet Take 81 mg by mouth daily.   . carvedilol (COREG) 6.25 MG tablet Take 6.25 mg by mouth 2 (two) times daily with a meal.   . clopidogrel (PLAVIX) 75 MG tablet TAKE 1 TABLET BY MOUTH EVERY DAY   . docusate sodium (COLACE) 100 MG capsule Take 100 mg by mouth 2 (two) times daily.   . furosemide (LASIX) 40 MG tablet TAKE 2 TABLETS BY MOUTH EVERY MORNING FOR FLUID 11/30/2015: 2 tablets in the morning and 2 tablets mid day   . insulin aspart (NOVOLOG FLEXPEN) 100 UNIT/ML FlexPen Inject into the skin 3 (three) times daily with meals. Sliding Scale 2 units-6 units patient has list for 100-400 BS readings  100-200, 2 units, 201-250, 4 units, 251--300 6 units, 301-350, 8 units, 351 to 400 10 units.   . Insulin NPH Isophane & Regular (NOVOLIN 70/30 [place]) Inject 40-50 Units into the  skin. 50 units every morning  40 units every evening   . Insulin Pen Needle (RELION SHORT PEN NEEDLES) 31G X 8 MM MISC 1 each by Does not apply route 2 (two) times daily.   [name] lisinopril (PRINIVIL,ZESTRIL) 40 MG tablet TAKE 1 TABLET DAILY   . polyethylene glycol (MIRALAX / GLYCOLAX) packet USE 1-2 PACKETS DAILY   . pravastatin (PRAVACHOL) 40 MG tablet TAKE 1 TABLET BY MOUTH AT BEDTIME   . ranitidine (ZANTAC) 150 MG tablet Take 150 mg by mouth 2 (two) times daily.   [name] senna (SENOKOT) 8.6 MG TABS tablet Take 2 tablets by mouth daily.     No facility-administered encounter medications on file as of 12/21/2015.     Functional Status:   In your present state of health, do you have any difficulty performing the following activities: 11/30/2015 11/22/2015  Hearing? N N  Vision? Y N  Difficulty concentrating or making decisions? Y N  Walking or climbing stairs? Y Y  Dressing or bathing? Y Y  Doing errands, shopping? [name]  Preparing Food and eating ? Y Y  Using the Toilet? Y Y  In the past six months, have you accidently leaked urine? Y Y  Do you have problems with loss of bowel control? N N  Managing your Medications? Y N  Managing your Finances? [name]  Housekeeping or managing your Housekeeping? Y Y  Some recent data might be hidden   Fall Risk  11/22/2015 02/23/2015 11/03/2014 07/28/2014  Falls in the past year? Yes No No No  Number falls in past yr: 1 - - -  Injury with Fall? No - - -  Risk for fall due to : History of fall(s) - - -  Follow up Falls prevention discussed - - -   Fall/Depression Screening:    PHQ 2/9 Scores 11/18/2015 02/23/2015 11/03/2014 07/28/2014  PHQ - 2 Score 1 0 0 0    Assessment:  Routine home visit , patient's husband present during visit and engaging in conversation.   Leg Swelling- Decreased swelling noted, reviewed importance of controlling salt in diet.  Diabetes- Patient has new prescription for novolog pen for sliding scale, review education   Social,Current home needs- will benefit from assistance in home with ADL's, social worker consulting  Safety/High Fall Risk- Will benefit from [place], interested in Cyt[facility] alert system available   Medications- will benefit from blister packaging of medications to help with organization and adherence , filled pill organizer during visit, explaining timing of each medication, patient will ask her family to help this weekend.  Incoming call from [place] at [place] office during home visit inquiring whether [facility] knew  of resources/pharmacies in area that did blister packaging to help increase patient medication compliance.  Also they wanted to make sure patient was educated on difference in her insulins , NOVOLOG flex pen for  sliding scale  she received today from pharmacy , and her 70/30 insulin. Discussed with Janett Billow patient did not have pravastatin and she will discuss renewal of prescription with Charlott Holler, NP.  Discussed with Janett Billow patient request for home therapy, she states patient has used up her days for home therapy for this year, they will address in the new year.  Plan:  Diabetes- checking blood sugars at least twice daily, reinforced with patient NOVOLOG flex pen  insulin use for sliding scale, sliding scale,  ranges and insulin dosages made sure patient able to read them, placed Copy in her  medication bag and taped on insulin box . Patient with teachback able to state use of sliding scale Novolog only  at meal times according to what her blood sugar is  and 70/30 am and pm as already ordered. Patient keeps her 70/30 on the kitchen table where is sits during the day and the novolog in the refrigerator.   Medic alert system- patient received return call from [place] [facility] explaining program to her, she wants to think about this and discuss with family   Medications- will collaborate with pharmacist regarding blister packaging of medications in the area. Made patient aware PCP office may call in a new prescription for her cholesterol medication , pravastatin.    Social - will collaborate with [name] regarding patient concerns today for future care needs, reminded patient Marcie Bal will call her to set up a home visit. Discussed with patient per PCP office report they will look into physical therapy in new year, due to days of therapy for this year already met.   Will plan next home visit within next month, will place phone call within next week or sooner as information regarding pill packing  is available.  [facility] CM Care Plan Problem One   Flowsheet Row Most Recent Value  Care Plan Problem One  Self care knowledge deficit related to Diabetes   Role Documenting the Problem One  Care Management Coordinator  Care Plan for Problem One  Active  THN Long Term Goal (31-90 days)  Patient will report increased knowledge of managing diabetes in the next 37 day s [goal restated ]  THN Long Term Goal Start Date  11/30/15  Interventions for Problem One Long Term Goal  Discussed importance of taking medications as prescribed, keeping all MD appointments   THN CM Short Term Goal #1 (0-30 days)  Patient will be able to state steps for treating hypoglycemia in the next 30 days   THN CM Short Term Goal #1 Start Date  11/30/15  Interventions for Short Term Goal #1  Reinforced with  patient instructions on symptoms of hypoglycemia and rule of 15 for treating   THN CM Short Term Goal #2 (0-30 days)  Patient will be able to states at least 2 measurers to help in controlling diabetes in the next 30 days .  THN CM Short Term Goal #2 Start Date  11/30/15  Interventions for Short Term Goal #2  Reviewed EMMI handout on measures Diabetes: preventing heart attack and stroke   THN CM Short Term Goal #3 (0-30 days)  Patient will be able to report foods to include and limit in diet in the next 30 days    THN CM Short Term Goal #3 Start Date  12/21/15  Interventions for Short Tern Goal #3  Reviewed EMMI handout of food choices to include in diet and limit portions     [facility] CM Care Plan Problem Two   Flowsheet Row Most Recent Value  Care Plan Problem Two  High fall risk   Role Documenting the Problem Two  Care Management [place] for Problem Two  Active  Interventions for Problem Two Long Term Goal   Reviewed fall prevention strategies   THN Long Term Goal (31-90) days  Patient will be able to state increased knowledge of preventing falls in the next 90 days  [goal restated ]  THN Long Term Goal  Start Date  11/30/15  THN CM Short Term Goal #1 (0-30 days)  Patient will not experience a  fall in the next 30 days   THN CM Short Term Goal #1 Start Date  11/30/15  THN CM Short Term Goal #2 (0-30 days)  Patient will have medical alert system in place in the next 30 days  THN CM Short Term Goal #2 Start Date  11/30/15  Interventions for Short Term Goal #2  Patient to discuss medic alert system with [place] [facility] with family and decide   THN CM Short Term Goal #3 (0-30 days)  Patient will report doing walking in place exercises at least 5 minutes , 5 times a week in the next 30 days   THN CM Short Term Goal #3 Start Date  11/30/15  Interventions for Short Term Goal #3  reinforced chair activity as tolerated      [name], RN, [place] Management [phone]- Mobile [phone]- [place]

## 2015-12-24 ENCOUNTER — Ambulatory Visit: Payer: Self-pay | Admitting: *Deleted

## 2015-12-27 ENCOUNTER — Other Ambulatory Visit: Payer: Self-pay | Admitting: *Deleted

## 2015-12-27 NOTE — Patient Outreach (Signed)
[facility]) Care Management  12/27/2015  [name] [name] April 27, 1931 [id number]  Telephone assessment call to follow up on pharmacy near her location that does pill packaging and delivery.   Spoke with patient reports that she is doing alright ,[name] [name] discussed that she likes using the pill organizer for her medications but discussed she is not sure about filling it herself and her sister in law that has been helping her has  not been able to come in the last week.  Care coordination call to [facility] family pharmacy , that does pill packaging at no extra cost and delivery can be arranged if transportation a problem. If patient decides that she wants to transfer her prescriptions to this pharmacy they will need a accurate medication list, and the agreement to transfer prescriptions.  Patient request assistance with refilling pill box for now and visit to talk about transferring prescriptions stating that she can understand better on the phone.   Plan  Will plan visit for 11/21 to assist patient .  Will update PCP office regarding pharmacy availability for pill packaging.   [name], RN, [place] Management [phone]- Mobile [phone]- Toll Free Main Office

## 2015-12-28 ENCOUNTER — Other Ambulatory Visit: Payer: Self-pay | Admitting: *Deleted

## 2015-12-28 NOTE — Patient Outreach (Signed)
[name] [name] [name] [name]) Care Management   12/28/2015  [name] [name] October 21, 1931 [id number]  [name] [name] is an 82 y.o. female  Subjective:   Patient states I need some help with filling this pill box, that helps me keep track of my medication.    Objective:  BP 116/74   Pulse 78   Resp 18   SpO2 98%   Sitting in wheelchair. Review of Systems  Constitutional: Negative.   HENT: Negative.   Eyes: Negative.   Respiratory: Negative.   Cardiovascular: Positive for leg swelling.       Decreased in swelling noted in legs from prior visit   Musculoskeletal: Negative.   Skin: Negative.   Endo/Heme/Allergies: Negative.   Psychiatric/Behavioral: Negative.     Physical Exam  Constitutional: She is oriented to person, place, and time. She appears well-developed and well-nourished.  Cardiovascular: Normal rate, normal heart sounds and intact distal pulses.   Respiratory: Effort normal and breath sounds normal.  GI: Soft.  Neurological: She is alert and oriented to person, place, and time.  Skin: Skin is warm and dry.  Psychiatric: She has a normal mood and affect. Her behavior is normal. Judgment and thought content normal.    Encounter Medications:   Outpatient Encounter Prescriptions as of 12/28/2015  Medication Sig Note  . ACCU-CHEK SOFTCLIX LANCETS lancets CHECK BLOOD SUGARS TWICE DAILY   . aspirin 81 MG tablet Take 81 mg by mouth daily.   . carvedilol (COREG) 6.25 MG tablet Take 6.25 mg by mouth 2 (two) times daily with a meal.   . clopidogrel (PLAVIX) 75 MG tablet TAKE 1 TABLET BY MOUTH EVERY DAY   . docusate sodium (COLACE) 100 MG capsule Take 100 mg by mouth 2 (two) times daily.   . furosemide (LASIX) 40 MG tablet TAKE 2 TABLETS BY MOUTH EVERY MORNING FOR FLUID 11/30/2015: 2 tablets in the morning and 2 tablets mid day   . insulin aspart (NOVOLOG FLEXPEN) 100 UNIT/ML FlexPen Inject into the skin 3 (three) times daily with meals. Sliding Scale 2  units-6 units patient has list for 100-400 BS readings  100-200, 2 units, 201-250, 4 units, 251--300 6 units, 301-350, 8 units, 351 to 400 10 units.   . Insulin NPH Isophane & Regular (NOVOLIN 70/30 [place]) Inject 40-50 Units into the skin. 50 units every morning  40 units every evening   . Insulin Pen Needle (RELION SHORT PEN NEEDLES) 31G X 8 MM MISC 1 each by Does not apply route 2 (two) times daily.   [name] lisinopril (PRINIVIL,ZESTRIL) 40 MG tablet TAKE 1 TABLET DAILY   . polyethylene glycol (MIRALAX / GLYCOLAX) packet USE 1-2 PACKETS DAILY   . pravastatin (PRAVACHOL) 40 MG tablet TAKE 1 TABLET BY MOUTH AT BEDTIME   . ranitidine (ZANTAC) 150 MG tablet Take 150 mg by mouth 2 (two) times daily.   [name] senna (SENOKOT) 8.6 MG TABS tablet Take 2 tablets by mouth daily.    No facility-administered encounter medications on file as of 12/28/2015.     Functional Status:   In your present state of health, do you have any difficulty performing the following activities: 11/30/2015 11/22/2015  Hearing? N N  Vision? Y N  Difficulty concentrating or making decisions? Y N  Walking or climbing stairs? Y Y  Dressing or bathing? Y Y  Doing errands, shopping? [name]  Preparing Food and eating ? Y Y  Using the Toilet? Y Y  In the past six months, have you accidently  leaked urine? Y Y  Do you have problems with loss of bowel control? N N  Managing your Medications? Y N  Managing your Finances? [name]  Housekeeping or managing your Housekeeping? [name]  Some recent data might be hidden    Fall/Depression Screening:    PHQ 2/9 Scores 11/18/2015 02/23/2015 11/03/2014 07/28/2014  PHQ - 2 Score 1 0 0 0    Assessment:   Received incoming call from Janett Billow, at [facility] office to follow up on pharmacy that does pill packaging. Discussed with her that I have home visit scheduled to discuss with patient , [facility] family pharmacy does pill packaging at no cost and will deliver.  Medications Adherence- Filled pill box for  through next Thursday 11/30. Placed call to [facility] family pharmacy so that patient to ask questions, about delivery, cost.  After phone call patient agreed to using the pharmacy for pill packaging. Patient placed a call to her [name] to discuss her plans, and ask him to take her pill bottles to pharmacy in the next day to begin process she also  requested that I speak with him, explained how pill packages will help patient with being more compliant and accurate with her daily medications he voices understanding and has agreed  take medication bottles to [place] on 11/22.  Explained that [facility] will contact [facility] to request transfer of prescriptions. I will contact PCP office to request a medication list be faxed to [facility]. The pharmacy requested that patient have pill organizer filled through next Thursday to allow time for process and delivery and they will notify patient prior to delivery.     Plan:  Will notify PCP office regarding pill packaging available at [facility], at no cost and they will deliver. Will follow up with patient in next month for home visit and in next 2 weeks to follow up on pill packaging .   [name], RN, [place] Management [phone]- Mobile [phone]- Toll Free Main Office

## 2015-12-28 NOTE — Patient Outreach (Signed)
[facility]) Care Management  [facility] Social Work  12/28/2015  [name] [name] 11/01/1931 [id number]  Subjective:   "I need help at home and I cannot afford it".   Encounter Medications:  Outpatient Encounter Prescriptions as of 12/28/2015  Medication Sig Note  . ACCU-CHEK SOFTCLIX LANCETS lancets CHECK BLOOD SUGARS TWICE DAILY   . aspirin 81 MG tablet Take 81 mg by mouth daily.   . carvedilol (COREG) 6.25 MG tablet Take 6.25 mg by mouth 2 (two) times daily with a meal.   . clopidogrel (PLAVIX) 75 MG tablet TAKE 1 TABLET BY MOUTH EVERY DAY   . docusate sodium (COLACE) 100 MG capsule Take 100 mg by mouth 2 (two) times daily.   . furosemide (LASIX) 40 MG tablet TAKE 2 TABLETS BY MOUTH EVERY MORNING FOR FLUID 11/30/2015: 2 tablets in the morning and 2 tablets mid day   . insulin aspart (NOVOLOG FLEXPEN) 100 UNIT/ML FlexPen Inject into the skin 3 (three) times daily with meals. Sliding Scale 2 units-6 units patient has list for 100-400 BS readings  100-200, 2 units, 201-250, 4 units, 251--300 6 units, 301-350, 8 units, 351 to 400 10 units.   . Insulin NPH Isophane & Regular (NOVOLIN 70/30 [place]) Inject 40-50 Units into the skin. 50 units every morning  40 units every evening   . Insulin Pen Needle (RELION SHORT PEN NEEDLES) 31G X 8 MM MISC 1 each by Does not apply route 2 (two) times daily.   [name] lisinopril (PRINIVIL,ZESTRIL) 40 MG tablet TAKE 1 TABLET DAILY   . polyethylene glycol (MIRALAX / GLYCOLAX) packet USE 1-2 PACKETS DAILY   . pravastatin (PRAVACHOL) 40 MG tablet TAKE 1 TABLET BY MOUTH AT BEDTIME   . ranitidine (ZANTAC) 150 MG tablet Take 150 mg by mouth 2 (two) times daily.   [name] senna (SENOKOT) 8.6 MG TABS tablet Take 2 tablets by mouth daily.    No facility-administered encounter medications on file as of 12/28/2015.     Functional Status:  In your present state of health, do you have any difficulty performing the following activities: 12/28/2015 11/30/2015   Hearing? N N  Vision? Y Y  Difficulty concentrating or making decisions? N Y  Walking or climbing stairs? Y Y  Dressing or bathing? Y Y  Doing errands, shopping? [name]  Preparing Food and eating ? Y Y  Using the Toilet? Y Y  In the past six months, have you accidently leaked urine? N Y  Do you have problems with loss of bowel control? N N  Managing your Medications? Y Y  Managing your Finances? [name]  Housekeeping or managing your Housekeeping? [name]  Some recent data might be hidden    Fall/Depression Screening:  PHQ 2/9 Scores 12/28/2015 11/18/2015 02/23/2015 11/03/2014 07/28/2014  PHQ - 2 Score 0 1 0 0 0    Assessment: CSW met with patient who reports she lives with her husband of 20 years (he is 95) and is in need of help since her stroke.  CSW discussed Medicaid, community resource/agencies which she may be eligible for. CSW discussed with patient that given her income ($1,86monthy) she is likely not able to receive Medicaid and she reports was turned down by [facility] because of income.  She has 4 adult children who live nearby and visit her regularly, per her report, but they are limited because of their work, family, etc.    Plan:  CSW will seek resources that may be beneficial for her and  send via mail as well as plan follow up call regarding eligibility.   [facility] CM Care Plan Problem One   Flowsheet Row Most Recent Value  Care Plan Problem One  Patient in need of additional support at home.  Role Documenting the Problem One  Clinical Social Worker  Care Plan for Problem One  Active  THN Long Term Goal (31-90 days)  Patient will report increased knowledge of community based resources to aide in her home care needs in the next 90days. Barrie Folk restated ]  THN Long Term Goal Start Date  12/28/15  Interventions for Problem One Long Term Goal  CSW discussed opportunities and community services that may be able to assist patient in home.  THN CM Short Term Goal #1 (0-30 days)  Patient  will receive and review additional help/support services/resources provided to her by CSW.  THN CM Short Term Goal #1 Start Date  12/28/15  Interventions for Short Term Goal #1  CSW discussed and will provide resources to patient ,     [name], MSW, [place] Worker  [place] [phone]

## 2015-12-29 ENCOUNTER — Other Ambulatory Visit: Payer: Self-pay | Admitting: *Deleted

## 2015-12-29 ENCOUNTER — Encounter: Payer: Self-pay | Admitting: *Deleted

## 2015-12-29 NOTE — Patient Outreach (Signed)
[place] [facility]) Care Management  12/29/2015  JEWELS KRESGE 27-Feb-1931 [id number]  Care Coordination call   Placed call to [facility] office in [place], related to patient in agreement with receiving blister packaging of medications and delivery from [facility] family pharmacy. Able to leave a message on [place] line with fax number to [place] at [phone] and telephone [phone], pharmacy request medication list be provided to them. I have filled patient medication organizer until 11/30, anticipated date pharmacy able to deliver 1st medication packages. Patient's son to deliver current medication bottles to pharmacy on 11/22.   Plan Will route this note to PCP office.  Will follow up with patient in the next  week regarding her medication delivery.   [name], RN, [place] Management [phone]- Mobile [phone]- Toll Free Main Office

## 2015-12-31 NOTE — Patient Outreach (Signed)
[place] [facility]) Care Management  12/31/2015  RYENN CHESTERMAN 25-Sep-1931 [id number]   Request received from [name], LCSW to mail community resources to patient. Resources mailed today, 12/31/15  Josepha Pigg, [place] Management Assistant

## 2016-01-07 ENCOUNTER — Other Ambulatory Visit: Payer: Self-pay | Admitting: *Deleted

## 2016-01-07 NOTE — Patient Outreach (Signed)
[facility]) Care Management  01/07/2016  RAKHEE MASSARELLI Oct 03, 1931 [id number]   Follow up telephone call  Placed call to patient , reports that she is doing pretty good, discussed swelling in her legs continue to decrease. Mrs.Martin states she is not able to balance well enough to stand on her scales even with her walker.  Discussed if patient had received her blister packaged medication yet from [facility] she states no, but they have called her asking questions. Patient discussed she has enough medication for today.  Placed call to [facility] they verify that they have medication ready in blister packages and will contact patient regarding her payment and deliver today.   1330 Returned call to patient , she states that everything is worked out and they will deliver her medications on today.  Plan Plan home follow up visit in the next week.  [name], RN, [place] Management [phone]- Mobile [phone]- Toll Free Main Office

## 2016-01-11 ENCOUNTER — Other Ambulatory Visit: Payer: Self-pay | Admitting: *Deleted

## 2016-01-11 NOTE — Patient Outreach (Signed)
[facility]) Care Management  01/11/2016  [name] [name] 1931-12-20 [id number]    CSW contacted patient who reports things are going well- " I wish I could get some more therapy". CSW encouraged her to speak with her PCP as well as to consider the silver sneakers, [employer] options. She has a family member helping with her baths but she also is wanting some additional help with bathing.  CSW will seek additional services/opportunities and follow up with patient. CSW did advise patient that this may be a private pay service.   CSW will plan follow up call with patient in 1-2 weeks.  [name], MSW, [place] Worker  [place] [phone]

## 2016-01-13 ENCOUNTER — Other Ambulatory Visit: Payer: Self-pay | Admitting: *Deleted

## 2016-01-13 NOTE — Patient Outreach (Addendum)
[facility]) Care Management   01/13/2016  [name] [name] 07-Jan-1932 [id number]  [name] [name] is an 82 y.o. female  Subjective:  Patient reports feeling pretty good, states she had trouble using her meter this morning, sometimes that happens.   Patient discussed sometimes when  her insulin pen gets down to example she supplied,if insulin pen has only 46 units in it and dose is 50 units she will only take what is pen instead of getting another pen from refrigerator to complete full dose due .   Patient states she has not used her novolog flex pen for sliding scale in the last month, reports she had at least 3 blood sugars at 300.   Patient discussed that she wishes she could get some therapy at home to help her get stronger, has been on working on trying to walk a little in the home able to walk down ramp with walker to the car.  Patient discussed being pleased with receiving blister packaging of medications and the delivery to her home.  Patient discussed that she is interested in medical alert system.  Objective:  BP 124/60 (BP Location: Left Arm, Patient Position: Sitting)   Pulse 60   Resp 18   SpO2 98%  Patient sitting in wheelchair at kitchen table .  Review of Systems  Constitutional: Negative.   HENT: Negative.   Eyes: Negative.   Respiratory: Negative.   Cardiovascular: Positive for leg swelling.       Decreased swelling in legs, noted swelling in feet.   Skin: Negative.     Physical Exam  Constitutional: She is oriented to person, place, and time. She appears well-developed and well-nourished.  Cardiovascular: Normal rate, normal heart sounds and intact distal pulses.   Respiratory: Effort normal and breath sounds normal.  GI: Soft.  Musculoskeletal:  Left side weakness  Neurological: She is alert and oriented to person, place, and time.  Skin: Skin is warm and dry.  Psychiatric: She has a normal mood and affect. Her behavior is  normal. Judgment and thought content normal.    Encounter Medications:   Outpatient Encounter Prescriptions as of 01/13/2016  Medication Sig Note  . ACCU-CHEK SOFTCLIX LANCETS lancets CHECK BLOOD SUGARS TWICE DAILY   . aspirin 81 MG tablet Take 81 mg by mouth daily.   . carvedilol (COREG) 6.25 MG tablet Take 6.25 mg by mouth 2 (two) times daily with a meal.   . clopidogrel (PLAVIX) 75 MG tablet TAKE 1 TABLET BY MOUTH EVERY DAY   . docusate sodium (COLACE) 100 MG capsule Take 100 mg by mouth 2 (two) times daily.   . furosemide (LASIX) 40 MG tablet TAKE 2 TABLETS BY MOUTH EVERY MORNING FOR FLUID 11/30/2015: 2 tablets in the morning and 2 tablets mid day   . Insulin NPH Isophane & Regular (NOVOLIN 70/30 [place]) Inject 40-50 Units into the skin. 50 units every morning  40 units every evening   . Insulin Pen Needle (RELION SHORT PEN NEEDLES) 31G X 8 MM MISC 1 each by Does not apply route 2 (two) times daily.   [name] lisinopril (PRINIVIL,ZESTRIL) 40 MG tablet TAKE 1 TABLET DAILY   . polyethylene glycol (MIRALAX / GLYCOLAX) packet USE 1-2 PACKETS DAILY   . pravastatin (PRAVACHOL) 40 MG tablet TAKE 1 TABLET BY MOUTH AT BEDTIME   . ranitidine (ZANTAC) 150 MG tablet Take 150 mg by mouth 2 (two) times daily.   [name] senna (SENOKOT) 8.6 MG TABS tablet Take 2 tablets  by mouth daily.   . insulin aspart (NOVOLOG FLEXPEN) 100 UNIT/ML FlexPen Inject into the skin 3 (three) times daily with meals. Sliding Scale 2 units-6 units patient has list for 100-400 BS readings  100-200, 2 units, 201-250, 4 units, 251--300 6 units, 301-350, 8 units, 351 to 400 10 units.    No facility-administered encounter medications on file as of 01/13/2016.     Functional Status:   In your present state of health, do you have any difficulty performing the following activities: 12/28/2015 11/30/2015  Hearing? N N  Vision? Y Y  Difficulty concentrating or making decisions? N Y  Walking or climbing stairs? Y Y  Dressing or bathing? Y Y   Doing errands, shopping? [name]  Preparing Food and eating ? Y Y  Using the Toilet? Y Y  In the past six months, have you accidently leaked urine? N Y  Do you have problems with loss of bowel control? N N  Managing your Medications? Y Y  Managing your Finances? [name]  Housekeeping or managing your Housekeeping? [name]  Some recent data might be hidden    Fall/Depression Screening:    PHQ 2/9 Scores 12/28/2015 11/18/2015 02/23/2015 11/03/2014 07/28/2014  PHQ - 2 Score 0 1 0 0 0    Fall Risk  12/28/2015 11/22/2015 02/23/2015 11/03/2014 07/28/2014  Falls in the past year? - Yes No No No  Number falls in past yr: - 1 - - -  Injury with Fall? - No - - -  Risk for fall due to : History of fall(s) History of fall(s) - - -  Follow up - Falls prevention discussed - - -   Assessment:    Diabetes :  30 day average for blood sugar 212  per meter, patient checks one to two times daily, not recording readings.  Patient has all needed supplies,required minimal assistance with using meter during visit.  Patient able to calculate dose needed from new insulin pen, if not enough remaining in her current pen to complete needed full  dose. Benefit on reinforcement of blood sugar control and taking all insulins as prescribed education.    Fall Safety Risk - No recent falls, reinforced activity as tolerated, reviewed with patient per MD will consider therapy in January when eligible again per insurance .  Will benefit from [place] system.   Lower Leg Swelling - decreasing, patient states  unable to stand balance well enough to stand and step up on scales to weigh without assistance, declined to try at visit .    Medication Blister packaging - Improved management of daily po medications.   Plan:  We plan next home visit in the next month. Placed call to [facility] regarding  alert system patient interested in patient  spoke with margaret in volunteer services Reviewed patient centered goals. Send MD visit note    [facility] CM Care Plan Problem One   Flowsheet Row Most Recent Value  Care Plan Problem One  Self care knowledge deficit related to Diabetes   Role Documenting the Problem One  Care Management Coordinator  Care Plan for Problem One  Active  THN Long Term Goal (31-90 days)  Patient will report increased knowledge of managing diabetes in the next 30 day s [goal restated ]  THN Long Term Goal Start Date  11/30/15  Interventions for Problem One Long Term Goal  Discussed with patient importance of taking medications as prescribed, to help control diabetes.   THN CM Short Term  Goal #1 (0-30 days)  Patient will be able to state steps for treating hypoglycemia in the next 30 days   THN CM Short Term Goal #1 Start Date  12/28/15  [facility] CM Short Term Goal #1 Met Date  01/07/16  Interventions for Short Term Goal #1  Reinforced with  patient instructions on symptoms of hypoglycemia and rule of 15 for treating   THN CM Short Term Goal #2 (0-30 days)  Patient will be able to states at least 2 measurers to help in controlling diabetes in the next 30 days .  THN CM Short Term Goal #2 Start Date  11/30/15  THN CM Short Term Goal #2 Met Date  12/28/15  THN CM Short Term Goal #3 (0-30 days)  Patient will be able to report foods to include and limit in diet in the next 30 days    THN CM Short Term Goal #3 Start Date  01/13/16 [goal date restarted ]  Interventions for Short Tern Goal #3  Viewed EMMI video on diabetes healthy eating   THN CM Short Term Goal #4 (0-30 days)  Patient will report monitoring and recording blood sugars at least twice daily in the next 30 days   THN CM Short Term Goal #4 Start Date  01/13/16  Interventions for Short Term Goal #4  Discussed benefit of keeping a written record of blood sugars to help toward managing blood sugar     THN CM Care Plan Problem Two   Flowsheet Row Most Recent Value  Care Plan Problem Two  High fall risk   Role Documenting the Problem Two  Care Management  [place] for Problem Two  Active  Interventions for Problem Two Long Term Goal   Discussed with patient how consistenty in with activity helps with increasing strenght   THN Long Term Goal (31-90) days  Patient will be able to state increased knowledge of preventing falls in the next 90 days  [goal restated ]  THN Long Term Goal Start Date  11/30/15  THN CM Short Term Goal #1 (0-30 days)  Patient will not experience a fall in the next 30 days   THN CM Short Term Goal #1 Start Date  11/30/15  [facility] CM Short Term Goal #1 Met Date   01/07/16  Interventions for Short Term Goal #2   Reinforced fall prevention strategies   THN CM Short Term Goal #2 (0-30 days)  Patient will have medical alert system in place in the next 30 days  THN CM Short Term Goal #2 Start Date  01/07/16 [goal date restarted]  Interventions for Short Term Goal #2  Assisted patient with making call to [facility] volunteer services for alert system   THN CM Short Term Goal #3 (0-30 days)  Patient will report doing walking in place exercises at least 5 minutes , 5 times a week in the next 30 days   THN CM Short Term Goal #3 Start Date  01/07/16 [goal date restarted ]  Interventions for Short Term Goal #3  Discussed use of her foot pedals exercise, reinforced  to continue leg lift exercises while in bed      [name], RN, [place] Management [phone]- Mobile [phone]- [place]

## 2016-01-21 ENCOUNTER — Other Ambulatory Visit: Payer: Self-pay | Admitting: *Deleted

## 2016-01-21 NOTE — Patient Outreach (Signed)
[facility]) Care Management  [facility] Social Work  01/21/2016  [name] [name] 11-18-1931 [id number]  Subjective:  "I still need some help with bathing"  Objective: Patent reports her sister in law is still helping her with bathing.   Current Medications:  Current Outpatient Prescriptions  Medication Sig Dispense Refill  . ACCU-CHEK SOFTCLIX LANCETS lancets CHECK BLOOD SUGARS TWICE DAILY 200 each 12  . aspirin 81 MG tablet Take 81 mg by mouth daily.    . carvedilol (COREG) 6.25 MG tablet Take 6.25 mg by mouth 2 (two) times daily with a meal.    . clopidogrel (PLAVIX) 75 MG tablet TAKE 1 TABLET BY MOUTH EVERY DAY 90 tablet 3  . docusate sodium (COLACE) 100 MG capsule Take 100 mg by mouth 2 (two) times daily.    . furosemide (LASIX) 40 MG tablet TAKE 2 TABLETS BY MOUTH EVERY MORNING FOR FLUID 60 tablet 6  . insulin aspart (NOVOLOG FLEXPEN) 100 UNIT/ML FlexPen Inject into the skin 3 (three) times daily with meals. Sliding Scale 2 units-6 units patient has list for 100-400 BS readings  100-200, 2 units, 201-250, 4 units, 251--300 6 units, 301-350, 8 units, 351 to 400 10 units.    . Insulin NPH Isophane & Regular (NOVOLIN 70/30 [place]) Inject 40-50 Units into the skin. 50 units every morning  40 units every evening    . Insulin Pen Needle (RELION SHORT PEN NEEDLES) 31G X 8 MM MISC 1 each by Does not apply route 2 (two) times daily. 50 each 11  . lisinopril (PRINIVIL,ZESTRIL) 40 MG tablet TAKE 1 TABLET DAILY 30 tablet 0  . polyethylene glycol (MIRALAX / GLYCOLAX) packet USE 1-2 PACKETS DAILY 60 packet 0  . pravastatin (PRAVACHOL) 40 MG tablet TAKE 1 TABLET BY MOUTH AT BEDTIME 30 tablet 3  . ranitidine (ZANTAC) 150 MG tablet Take 150 mg by mouth 2 (two) times daily.    [name] senna (SENOKOT) 8.6 MG TABS tablet Take 2 tablets by mouth daily.     No current facility-administered medications for this visit.     Functional Status:  In your present state of health, do you have  any difficulty performing the following activities: 12/28/2015 11/30/2015  Hearing? N N  Vision? Y Y  Difficulty concentrating or making decisions? N Y  Walking or climbing stairs? Y Y  Dressing or bathing? Y Y  Doing errands, shopping? [name]  Preparing Food and eating ? Y Y  Using the Toilet? Y Y  In the past six months, have you accidently leaked urine? N Y  Do you have problems with loss of bowel control? N N  Managing your Medications? Y Y  Managing your Finances? [name]  Housekeeping or managing your Housekeeping? [name]  Some recent data might be hidden    Fall/Depression Screening:  PHQ 2/9 Scores 12/28/2015 11/18/2015 02/23/2015 11/03/2014 07/28/2014  PHQ - 2 Score 0 1 0 0 0    Assessment:  Patient reports she has not any falls but "I wish my body would get stronger faster than it is". She is hopiong to get some therapies started back up in the new year; "My insurance might pay for it after the new year. She is frustrated with her progress to regain strength and independence as well as "my bowels moving during the night".  Paient does not intend to move to a facility at this time. CSW will seek community resources for bathing assist and advised patient this is likely to  be a non-covered service and would be private pay,     Plan:  CSW will plan follow up call later this month to advise on services/support and potential follow up [place], MSW, [place] Worker  [place] [phone]

## 2016-02-02 ENCOUNTER — Other Ambulatory Visit: Payer: Self-pay | Admitting: *Deleted

## 2016-02-02 DIAGNOSIS — Z8673 Personal history of transient ischemic attack (TIA), and cerebral infarction without residual deficits: Secondary | ICD-10-CM | POA: Diagnosis not present

## 2016-02-02 DIAGNOSIS — I129 Hypertensive chronic kidney disease with stage 1 through stage 4 chronic kidney disease, or unspecified chronic kidney disease: Secondary | ICD-10-CM | POA: Diagnosis not present

## 2016-02-02 DIAGNOSIS — I2510 Atherosclerotic heart disease of native coronary artery without angina pectoris: Secondary | ICD-10-CM | POA: Diagnosis not present

## 2016-02-02 DIAGNOSIS — Z794 Long term (current) use of insulin: Secondary | ICD-10-CM | POA: Diagnosis not present

## 2016-02-02 DIAGNOSIS — Z87891 Personal history of nicotine dependence: Secondary | ICD-10-CM | POA: Diagnosis not present

## 2016-02-02 DIAGNOSIS — Z951 Presence of aortocoronary bypass graft: Secondary | ICD-10-CM | POA: Diagnosis not present

## 2016-02-02 DIAGNOSIS — N189 Chronic kidney disease, unspecified: Secondary | ICD-10-CM | POA: Diagnosis not present

## 2016-02-02 DIAGNOSIS — E1122 Type 2 diabetes mellitus with diabetic chronic kidney disease: Secondary | ICD-10-CM | POA: Diagnosis not present

## 2016-02-02 DIAGNOSIS — N950 Postmenopausal bleeding: Secondary | ICD-10-CM | POA: Diagnosis not present

## 2016-02-02 NOTE — Patient Outreach (Signed)
  [facility] [facility]) Care Management  [facility] Social Work  02/02/2016  BRIN HAUPTMAN Jan 20, 1932 [id number] .  CSW left a HIPPA compliant message for patient and will await callback or try again at a later time. \\\  [name], MSW, [place] Worker  [place] [phone]

## 2016-02-03 ENCOUNTER — Ambulatory Visit: Payer: Self-pay | Admitting: *Deleted

## 2016-02-08 ENCOUNTER — Other Ambulatory Visit: Payer: Self-pay | Admitting: *Deleted

## 2016-02-08 NOTE — Patient Outreach (Signed)
[facility]) Care Management  02/08/2016  [name] [name] 1931-11-03 [id number]   Patient reports she has had some bleeding/spotting and is waiting to hear back from her MD.  CSW discussed CNA list and other community based resources mailed to her; she reports she cannot afford to pay someone to assist at this time.  "My sister in law is helping me some with bathing and I also get help with some housekeeping'.  Patient continues to decline placement and is encouraged to review and consider community resources provided.  CSW will advise [facility] RNCM of patient's report of bleeding.  CSW will plan f/u call in 1-2 weeks.    [name], MSW, [place] Worker  [place] [phone]

## 2016-02-08 NOTE — Patient Outreach (Signed)
[facility]) Care Management  02/08/2016  [name] [name] 10/05/1931 [id number]   Telephone assessment  Placed call to follow up with patient after receiving notification from [name], LCSW regarding patient concern regarding bleeding/spotting.  [name] [name] discussed notices spotting bleeding in her briefs/depends beginning on 12/27 she visited Emergency room at [facility] on 12/27, reported they examined her with a  ultrasound and noted bleeding at vaginal area. Patient reports receiving a "tape' to give to her doctor,and possible need to see a gynecologist. Patient reports she has been waiting to hear back regarding next appointment.   Patient reports that she still has  a little spotting, it has not gotten any worse.   Plan Place call to Charlott Holler NP office, spoke with [facility] regarding patient concerns and follow up. Office appointment arranged for 1/5.  Returned call to [name] [name] notified of appointment and to bring tape from ED visit to PCP appointment patient able to recall information of date/time and to bring information she received from her ED visit, patient relieved to have follow up visit. .   Patient to notify MD of worsening of symptoms.  Will follow up with home visit in the next week.  [name], RN, [place] Management [phone]- Mobile [phone]- Toll Free Main Office

## 2016-02-11 DIAGNOSIS — R600 Localized edema: Secondary | ICD-10-CM | POA: Diagnosis not present

## 2016-02-11 DIAGNOSIS — N939 Abnormal uterine and vaginal bleeding, unspecified: Secondary | ICD-10-CM | POA: Diagnosis not present

## 2016-02-11 DIAGNOSIS — Z8673 Personal history of transient ischemic attack (TIA), and cerebral infarction without residual deficits: Secondary | ICD-10-CM | POA: Diagnosis not present

## 2016-02-11 DIAGNOSIS — Z6836 Body mass index (BMI) 36.0-36.9, adult: Secondary | ICD-10-CM | POA: Diagnosis not present

## 2016-02-11 DIAGNOSIS — Z7982 Long term (current) use of aspirin: Secondary | ICD-10-CM | POA: Diagnosis not present

## 2016-02-11 DIAGNOSIS — E669 Obesity, unspecified: Secondary | ICD-10-CM | POA: Diagnosis not present

## 2016-02-11 DIAGNOSIS — E1122 Type 2 diabetes mellitus with diabetic chronic kidney disease: Secondary | ICD-10-CM | POA: Diagnosis not present

## 2016-02-11 DIAGNOSIS — I129 Hypertensive chronic kidney disease with stage 1 through stage 4 chronic kidney disease, or unspecified chronic kidney disease: Secondary | ICD-10-CM | POA: Diagnosis not present

## 2016-02-11 DIAGNOSIS — N950 Postmenopausal bleeding: Secondary | ICD-10-CM | POA: Diagnosis not present

## 2016-02-11 DIAGNOSIS — N189 Chronic kidney disease, unspecified: Secondary | ICD-10-CM | POA: Diagnosis not present

## 2016-02-11 DIAGNOSIS — Z79899 Other long term (current) drug therapy: Secondary | ICD-10-CM | POA: Diagnosis not present

## 2016-02-11 DIAGNOSIS — Z794 Long term (current) use of insulin: Secondary | ICD-10-CM | POA: Diagnosis not present

## 2016-02-11 DIAGNOSIS — I2510 Atherosclerotic heart disease of native coronary artery without angina pectoris: Secondary | ICD-10-CM | POA: Diagnosis not present

## 2016-02-11 DIAGNOSIS — Z87891 Personal history of nicotine dependence: Secondary | ICD-10-CM | POA: Diagnosis not present

## 2016-02-11 DIAGNOSIS — E113593 Type 2 diabetes mellitus with proliferative diabetic retinopathy without macular edema, bilateral: Secondary | ICD-10-CM | POA: Diagnosis not present

## 2016-02-12 DIAGNOSIS — N950 Postmenopausal bleeding: Secondary | ICD-10-CM | POA: Diagnosis not present

## 2016-02-12 DIAGNOSIS — E1122 Type 2 diabetes mellitus with diabetic chronic kidney disease: Secondary | ICD-10-CM | POA: Diagnosis not present

## 2016-02-12 DIAGNOSIS — I129 Hypertensive chronic kidney disease with stage 1 through stage 4 chronic kidney disease, or unspecified chronic kidney disease: Secondary | ICD-10-CM | POA: Diagnosis not present

## 2016-02-12 DIAGNOSIS — R32 Unspecified urinary incontinence: Secondary | ICD-10-CM | POA: Diagnosis not present

## 2016-02-12 DIAGNOSIS — N710 Acute inflammatory disease of uterus: Secondary | ICD-10-CM | POA: Diagnosis not present

## 2016-02-12 DIAGNOSIS — N852 Hypertrophy of uterus: Secondary | ICD-10-CM | POA: Diagnosis not present

## 2016-02-12 DIAGNOSIS — I2510 Atherosclerotic heart disease of native coronary artery without angina pectoris: Secondary | ICD-10-CM | POA: Diagnosis not present

## 2016-02-12 DIAGNOSIS — Z124 Encounter for screening for malignant neoplasm of cervix: Secondary | ICD-10-CM | POA: Diagnosis not present

## 2016-02-12 DIAGNOSIS — N888 Other specified noninflammatory disorders of cervix uteri: Secondary | ICD-10-CM | POA: Diagnosis not present

## 2016-02-12 DIAGNOSIS — N858 Other specified noninflammatory disorders of uterus: Secondary | ICD-10-CM | POA: Diagnosis not present

## 2016-02-12 DIAGNOSIS — N189 Chronic kidney disease, unspecified: Secondary | ICD-10-CM | POA: Diagnosis not present

## 2016-02-12 DIAGNOSIS — N939 Abnormal uterine and vaginal bleeding, unspecified: Secondary | ICD-10-CM | POA: Diagnosis not present

## 2016-02-13 DIAGNOSIS — N950 Postmenopausal bleeding: Secondary | ICD-10-CM | POA: Diagnosis not present

## 2016-02-13 DIAGNOSIS — N888 Other specified noninflammatory disorders of cervix uteri: Secondary | ICD-10-CM | POA: Diagnosis not present

## 2016-02-13 DIAGNOSIS — E119 Type 2 diabetes mellitus without complications: Secondary | ICD-10-CM | POA: Diagnosis not present

## 2016-02-13 DIAGNOSIS — N710 Acute inflammatory disease of uterus: Secondary | ICD-10-CM | POA: Diagnosis not present

## 2016-02-13 DIAGNOSIS — I10 Essential (primary) hypertension: Secondary | ICD-10-CM | POA: Diagnosis not present

## 2016-02-13 DIAGNOSIS — I2510 Atherosclerotic heart disease of native coronary artery without angina pectoris: Secondary | ICD-10-CM | POA: Diagnosis not present

## 2016-02-13 DIAGNOSIS — N189 Chronic kidney disease, unspecified: Secondary | ICD-10-CM | POA: Diagnosis not present

## 2016-02-13 DIAGNOSIS — R32 Unspecified urinary incontinence: Secondary | ICD-10-CM | POA: Diagnosis not present

## 2016-02-13 DIAGNOSIS — Z8673 Personal history of transient ischemic attack (TIA), and cerebral infarction without residual deficits: Secondary | ICD-10-CM | POA: Diagnosis not present

## 2016-02-13 DIAGNOSIS — E1122 Type 2 diabetes mellitus with diabetic chronic kidney disease: Secondary | ICD-10-CM | POA: Diagnosis not present

## 2016-02-13 DIAGNOSIS — I129 Hypertensive chronic kidney disease with stage 1 through stage 4 chronic kidney disease, or unspecified chronic kidney disease: Secondary | ICD-10-CM | POA: Diagnosis not present

## 2016-02-13 DIAGNOSIS — Z124 Encounter for screening for malignant neoplasm of cervix: Secondary | ICD-10-CM | POA: Diagnosis not present

## 2016-02-14 ENCOUNTER — Other Ambulatory Visit: Payer: Self-pay | Admitting: *Deleted

## 2016-02-14 NOTE — Patient Outreach (Addendum)
[facility] [place] [facility]) Care Management  02/14/2016  [name] [name] 1932-01-31 [id number]  Follow up on patient call to Medical call center  Spoke with patient reports she was trying to contact me to update regarding recent events, patient attended scheduled visit to PCP on 1/5,and referral placed with GYN doctor in [place] on 1/11. Patient states she had increased bleeding, blood clots on Friday evening and went to [facility] and due to concern regarding bleeding and blood count drop she was  transferred and admitted to [facility] on Friday and was discharged on 1/7. Patient discussed she had a biopsy [name] during her stay,but may need further testing/surgery.  Patient discussed she was started on a medication for 5 days to stop the bleeding, and she has stopped taking her Plavix. [name] [name]  Patient reports she has a follow visit at her Cardiology office to get clearance for surgery if she needs it.   Patient states her family as agreed that she keep her follow up regarding bleeding at [facility] , patient states she is unsure if she has an appointment she gets a little mixed up with all the information,  she requested that I call her daughter [name] [name] [phone] that is helping with arrangement and talk to her she provided contact information.  Spoke [name] [name], hipaa information verified, explained reason for the call, she states patient has follow up appointment in [facility] on Friday 1/12 with GYN instead of keeping appointment in [place] with GYN  and she will be with patient at visit. [name] [name] reports patient is taking a medication she spelled tranexamic for 5 days to help control bleeding and plavix has been removed from her pill package.   Plan Will meet patient in home this week for previously scheduled home visit. Patient will take medication as prescribed  Patient will seek immediate medical attention , 911 for increased bleeding symptoms.   Patient will schedule visit follow up visit with PCP .  Will route this visit note to PCP.   Since patient reported inpatient admission, will proceed as transition of care .   [name], RN, [place] Management [phone]- Mobile [phone]- Toll Free Main Office

## 2016-02-17 ENCOUNTER — Other Ambulatory Visit: Payer: Self-pay | Admitting: *Deleted

## 2016-02-17 ENCOUNTER — Encounter: Payer: Self-pay | Admitting: *Deleted

## 2016-02-17 NOTE — Patient Outreach (Addendum)
[name] [name]   02/17/2016  [name] [name] 1931/09/05 [id number]  [name] [name] is an 83 y.o. female  Subjective:  Patient discussed her recent medical events of going to [place] [facility] and being admitted to [place] and her upcoming follow up appointment with GYN related to vaginal bleeding she is having. Patient discussed bleeding today is just a little spotting has not increased. Patient voiced concern her right foot 5th toe hurting last night.  Patient discussed Monday she thought her blood sugar was 500 and called the doctor office,and they told her to go to emergency room, and to increase her 70/30 insulin to 60 units in the morning  she then rechecked it and it was 339, patient thinks she was looking at the meter upside down, states she did take 2 units of her novolog, she discussed being fearful of her blood sugar running low, denies recent episode of hypoglycemia .  Patient states she has misplaced her new [employer] card and requesting assistance in calling for new one.    Objective: BP 110/60 (BP Location: Left Arm, Patient Position: Sitting)   Pulse 60   Resp 18   SpO2 95%   Patient resting in wheel chair, completing her breakfast of applesauce , sausage and a boiled eggs.  Meals on wheels arrived.  Review of Systems  Constitutional: Negative.   HENT: Negative.   Eyes: Negative.   Respiratory: Negative.   Cardiovascular: Positive for leg swelling.  Gastrointestinal: Negative.   Genitourinary: Negative.   Skin: Negative.   Neurological:       Left side weakness, hx CVA no change  Endo/Heme/Allergies: Negative.   Psychiatric/Behavioral: Negative.     Physical Exam  Constitutional: She is oriented to person, place, and time. She appears well-developed and well-nourished.  Cardiovascular: Normal rate and normal heart sounds.   Respiratory: Effort normal and breath sounds normal.  GI: Soft. Bowel sounds are normal.    Neurological: She is alert and oriented to person, place, and time.  Skin: Skin is warm and dry.     Psychiatric: She has a normal mood and affect. Her behavior is normal. Judgment and thought content normal.    Encounter Medications:   Outpatient Encounter Prescriptions as of 02/17/2016  Medication Sig Note  . ACCU-CHEK SOFTCLIX LANCETS lancets CHECK BLOOD SUGARS TWICE DAILY   . carvedilol (COREG) 6.25 MG tablet Take 6.25 mg by mouth 2 (two) times daily with a meal.   . docusate sodium (COLACE) 100 MG capsule Take 100 mg by mouth 2 (two) times daily.   . furosemide (LASIX) 40 MG tablet TAKE 2 TABLETS BY MOUTH EVERY MORNING FOR FLUID 11/30/2015: 2 tablets in the morning and 2 tablets mid day   . Insulin NPH Isophane & Regular (NOVOLIN 70/30 [place]) Inject 40-50 Units into the skin. 50 units every morning  40 units every evening   . Insulin Pen Needle (RELION SHORT PEN NEEDLES) 31G X 8 MM MISC 1 each by Does not apply route 2 (two) times daily.   [name] lisinopril (PRINIVIL,ZESTRIL) 40 MG tablet TAKE 1 TABLET DAILY   . polyethylene glycol (MIRALAX / GLYCOLAX) packet USE 1-2 PACKETS DAILY   . pravastatin (PRAVACHOL) 40 MG tablet TAKE 1 TABLET BY MOUTH AT BEDTIME   . ranitidine (ZANTAC) 150 MG tablet Take 150 mg by mouth 2 (two) times daily.   [name] aspirin 81 MG tablet Take 81 mg by mouth daily.   . clopidogrel (PLAVIX) 75 MG tablet TAKE  1 TABLET BY MOUTH EVERY DAY (Patient not taking: Reported on 02/17/2016)   . insulin aspart (NOVOLOG FLEXPEN) 100 UNIT/ML FlexPen Inject into the skin 3 (three) times daily with meals. Sliding Scale 2 units-6 units patient has list for 100-400 BS readings  100-200, 2 units, 201-250, 4 units, 251--300 6 units, 301-350, 8 units, 351 to 400 10 units.   [name] senna (SENOKOT) 8.6 MG TABS tablet Take 2 tablets by mouth daily.    No facility-administered encounter medications on file as of 02/17/2016.     Functional Status:   In your present state of health, do you have any  difficulty performing the following activities: 02/17/2016 12/28/2015  Hearing? N N  Vision? Y Y  Difficulty concentrating or making decisions? Y N  Walking or climbing stairs? Y Y  Dressing or bathing? Y Y  Doing errands, shopping? [name] [name]  Preparing Food and eating ? Y Y  Using the Toilet? Y Y  In the past six months, have you accidently leaked urine? Y N  Do you have problems with loss of bowel control? Y N  Managing your Medications? Y Y  Managing your Finances? [name] [name]  Housekeeping or managing your Housekeeping? [name] [name]  Some recent data might be hidden    Fall/Depression Screening:    PHQ 2/9 Scores 12/28/2015 11/22/2015 11/18/2015 02/23/2015 11/03/2014 07/28/2014  PHQ - 2 Score 0 1 1 0 0 0   Fall Risk  02/17/2016 12/28/2015 11/22/2015 02/23/2015 11/03/2014  Falls in the past year? Yes - Yes No No  Number falls in past yr: 1 - 1 - -  Injury with Fall? No - No - -  Risk for fall due to : Impaired balance/gait;Impaired mobility;History of fall(s) History of fall(s) History of fall(s) - -  Follow up Falls prevention discussed - Falls prevention discussed - -   Assessment:  Transition of care home visit   Vaginal bleeding.- Patient now taking tranexamic acid as prescribed 2 twice daily, reports 1st day she took only  1 twice daily. Verified plavix has been removed from her blister packaged medication.   Right foot fifth toe -  On the inner part of 5th toe , approximately .5 cm skin broken area open, red at site, will benefit from education on keeping site clean  Fall Risk - High , reviewed fall prevention measures . Patient now has medic alert system through [name] [name].  Diabetes - patient reports taking insulin 70/30  as previously prescribed, fearful of having low blood sugars so she is hesitant in taking her novolog insulin as prescribed and increasing 70/30. Will continue to benefit from continued education and support for diabetes [name]. Patient demonstrated use of her meter, she prefers  not to use lancet device, pricks finger using just lancet. CBG reading during visit 132 this am, 30 day average 208.    Plan:  Patient will continue to take tranexamic as prescribed  Placed call to PCP office able to speak with Dorian Pod to notify of 5 th toe wound site, she will forward information to Charlott Holler, NP, offered patient office in am, patient declined due to GYN appointment she has in [facility] tomorrow after noon  she felt it would be too much for her, office visit set for Monday 1/15. Cleaned site of wound and ban aid applied, and education  to patient and husband on cleaning site.  Patient requested that I call her daughter to update her , call completed. Update [facility] consent with her daughter [name]  Hayes contact information.  Assist patient with calling for [name] insurance replacement card. Patient will attend upcoming cardiology, PCP visits.  Will place transition of care outreach call in the next week, Patient centered care goals and plans developed.  Will send this visit note to PCP.  THN CM Care Plan Problem One   Flowsheet Row Most Recent Value  Care Plan Problem One  Recent [name] admission related to vaginal bleeding high risk for readmission   Role Documenting the Problem One  Care [name] [place] for Problem One  Active  THN Long Term Goal (31-90 days)  Patient will not experience a [name] admission in the next 31 days   THN Long Term Goal Start Date  02/17/16  Interventions for Problem One Long Term Goal  Reviewed discharge instructions reinforced importance of taking medications as prescribed and notifying MD sooner for worsening of symptoms, explained transition of care   THN CM Short Term Goal #1 (0-30 days)  Patient will be able to state worsening symptoms to notify MD of in the next 30 days   THN CM Short Term Goal #1 Start Date  02/17/16  [facility] CM Short Term Goal #1 Met Date  01/07/16  Interventions for Short Term Goal #1  Reviewed  instructions regarding notifying MD for increased bleeding, soaking through 2 pads in hour   THN CM Short Term Goal #2 (0-30 days)  Patient will continue to check blood sugar at least twice daily in the next 30 days   THN CM Short Term Goal #2 Start Date  02/17/16  [facility] CM Short Term Goal #2 Met Date  12/28/15  Interventions for Short Term Goal #2  Reviewed importance of continuing to check blood sugar and notify MD of concerns,   THN CM Short Term Goal #3 (0-30 days)  Patient and family will be able to assist with wound care and infection prevention in the next 30 days   THN CM Short Term Goal #3 Start Date  02/17/16  Interventions for Short Tern Goal #3  Reviewed importance of wearing socks, discussed signs of infection , redness, swelling , drainage to notify MD of cleaning site with soap and water , keeping dry until seen by MD for furhter instructions.   THN CM Short Term Goal #4 (0-30 days)  Patient will not experience a fall in the next 30 days   THN CM Short Term Goal #4 Start Date  02/17/16  Interventions for Short Term Goal #4  Reviewed fall prevention measures     THN CM Care Plan Problem Two   Flowsheet Row Most Recent Value  Care Plan Problem Two  High fall risk   Role Documenting the Problem Two  Care [name] Coordinator  Care Plan for Problem Two  Not Active  Interventions for Problem Two Long Term Goal   Discussed with patient how consistenty in with activity helps with increasing strenght   THN Long Term Goal (31-90) days  Patient will be able to state increased knowledge of preventing falls in the next 90 days  [goal restated ]  THN Long Term Goal Start Date  11/30/15  THN CM Short Term Goal #1 (0-30 days)  Patient will not experience a fall in the next 30 days   THN CM Short Term Goal #1 Start Date  11/30/15  [facility] CM Short Term Goal #1 Met Date   01/07/16  Interventions for Short Term Goal #2   Reinforced fall prevention strategies   THN  CM Short Term Goal #2 (0-30 days)   Patient will have medical alert system in place in the next 30 days  THN CM Short Term Goal #2 Start Date  01/07/16 [goal date restarted]  Interventions for Short Term Goal #2  Assisted patient with making call to [place] volunteer services for alert system   THN CM Short Term Goal #3 (0-30 days)  Patient will report doing walking in place exercises at least 5 minutes , 5 times a week in the next 30 days   THN CM Short Term Goal #3 Start Date  01/07/16 [goal date restarted ]  Interventions for Short Term Goal #3  Discussed use of her foot pedals exercise, reinforced  to continue leg lift exercises while in bed       [name], RN, [place] [name] [phone]- Mobile [phone]- [place]

## 2016-02-18 ENCOUNTER — Telehealth: Payer: Self-pay | Admitting: Student

## 2016-02-18 DIAGNOSIS — N939 Abnormal uterine and vaginal bleeding, unspecified: Secondary | ICD-10-CM | POA: Diagnosis not present

## 2016-02-18 DIAGNOSIS — N950 Postmenopausal bleeding: Secondary | ICD-10-CM | POA: Diagnosis not present

## 2016-02-18 NOTE — Telephone Encounter (Signed)
Received records from [facility] for appointment on 02/22/16 with Bernerd Pho, [place].  Records put with Brittany's records for schedule on 02/22/16. lp

## 2016-02-21 ENCOUNTER — Other Ambulatory Visit: Payer: Self-pay | Admitting: *Deleted

## 2016-02-21 DIAGNOSIS — E11621 Type 2 diabetes mellitus with foot ulcer: Secondary | ICD-10-CM | POA: Diagnosis not present

## 2016-02-21 NOTE — Progress Notes (Signed)
Cardiology Office Note    Date:  02/22/2016   ID:  [name] [name], DOB 01-17-1932, MRN [id number]  PCP:  Philmore Pali, NP  Cardiologist: Dr. Gwenlyn Found   Chief Complaint  Patient presents with  . Follow-up    Surgical clearance.    History of Present Illness:    [name] [name] is a 83 y.o. female with past medical history of CAD (s/p CABG in 2008 with LIMA-LAD, SVG-D1-OM1, and SVG-PDA), HTN, HLD, PVD, chronic diastolic CHF (EF 0000000 by echo in 12/2014), and prior CVA who presents to the office today for surgical clearance.  She was last seen in the office by Dr. Gwenlyn Found in 07/2015 and reported doing well from a cardiac perspective at that time. Had been admitted to the hospital weeks prior for dehydration.  Last ischemic evaluation was a NST in 12/2014 which was low-risk and showed a prior lateral wall infarction with a small amount of per-infarct ischemia which was unchanged from prior imaging in 2014.  Was recently seen in the [facility] ED on 02/11/2016 for vaginal bleeding. Hgb was stable at 11.2 on admission but at 8.7 two days afterwards. Plavix was discontinued but she remained on an 81mg  ASA. Started on Tranexamix acid and followed up with OB-GYN as an outpatient. They have attempted to perform an endometrial biopsy but there is concern they were unable to obtain tissue from her endometrial cavity and are planning for a D&C with hysteroscopy as there is concern she may have endometrial cancer.   In talking with the patient today, she denies any episodes of chest discomfort or dyspnea with exertion. She is not as active as once was due to the recent CVA in 2017. Reports regaining her strength and uses a walker for ambulation. She denies any anginal symptoms with this.  Reports her vaginal bleeding has improved since her recent ED visit. Was started on Provera by her OB-GYN which has significantly helped with her symptoms. Reports occasional spotting.    Past Medical  History:  Diagnosis Date  . CAD (coronary artery disease) 2008   a. s/p CABG in 2008 with LIMA-LAD, SVG-D1-OM1, and SVG-PDA  . CHF (congestive heart failure) ([place])   . Claudication in peripheral vascular disease ([place]) 05/2012    ABI right 0.81; left 0.98   . CVA (cerebral infarction) 2014  . Daytime somnolence     hoping CPAP will be able to help this  . Diabetes mellitus type 2 with complications, uncontrolled (HCC)    CAD, CVA  . H/O cardiovascular stress test 06/2012   Stable with lateral scar and moderate. Infarction ischemia  . H/O gastritis   . HTN (hypertension)   . Hyperlipidemia LDL goal < 70   . Mallory-Weiss tear    History of  . Obesity (BMI 30.0-34.9)   . OSA on CPAP 2014    patient is just now beginning CPAP    Past Surgical History:  Procedure Laterality Date  . CHOLECYSTECTOMY    . CORONARY ARTERY BYPASS GRAFT  12/31/2006   CABG x4, LIMA-LAD, sequential VG-diagonal branch of LAD, OM of left circumflex & PLA branch of RCA    Current Medications: Outpatient Medications Prior to Visit  Medication Sig Dispense Refill  . ACCU-CHEK SOFTCLIX LANCETS lancets CHECK BLOOD SUGARS TWICE DAILY 200 each 12  . aspirin 81 MG tablet Take 81 mg by mouth daily.    . carvedilol (COREG) 6.25 MG tablet Take 6.25 mg by mouth 2 (two) times daily with a  meal.    . furosemide (LASIX) 40 MG tablet TAKE 2 TABLETS BY MOUTH EVERY MORNING FOR FLUID 60 tablet 6  . insulin aspart (NOVOLOG FLEXPEN) 100 UNIT/ML FlexPen Inject into the skin 3 (three) times daily with meals. Sliding Scale 2 units-6 units patient has list for 100-400 BS readings  100-200, 2 units, 201-250, 4 units, 251--300 6 units, 301-350, 8 units, 351 to 400 10 units.    . Insulin NPH Isophane & Regular (NOVOLIN 70/30 [place]) Inject 40-50 Units into the skin. 50 units every morning  40 units every evening    . Insulin Pen Needle (RELION SHORT PEN NEEDLES) 31G X 8 MM MISC 1 each by Does not apply route 2 (two) times daily. 50 each 11   . lisinopril (PRINIVIL,ZESTRIL) 40 MG tablet TAKE 1 TABLET DAILY 30 tablet 0  . polyethylene glycol (MIRALAX / GLYCOLAX) packet USE 1-2 PACKETS DAILY 60 packet 0  . pravastatin (PRAVACHOL) 40 MG tablet TAKE 1 TABLET BY MOUTH AT BEDTIME 30 tablet 3  . ranitidine (ZANTAC) 150 MG tablet Take 150 mg by mouth 2 (two) times daily.    . clopidogrel (PLAVIX) 75 MG tablet TAKE 1 TABLET BY MOUTH EVERY DAY (Patient not taking: Reported on 02/17/2016) 90 tablet 3  . docusate sodium (COLACE) 100 MG capsule Take 100 mg by mouth 2 (two) times daily.    [name] senna (SENOKOT) 8.6 MG TABS tablet Take 2 tablets by mouth daily.     No facility-administered medications prior to visit.      Allergies:   Patient has no known allergies.   Social History   Social History  . Marital status: Single    Spouse name: N/A  . Number of children: N/A  . Years of education: N/A   Social History Main Topics  . Smoking status: Former Research scientist (life sciences)  . Smokeless tobacco: Never Used     Comment: 50 years ago  . Alcohol use No  . Drug use: No  . Sexual activity: Not Asked   Other Topics Concern  . None   Social History Narrative  . None     Family History:  The patient's family history includes Diabetes in her brother and sister; Stroke in her mother.   Review of Systems:   Please see the history of present illness.     General:  No chills, fever, night sweats or weight changes.  Cardiovascular:  No chest pain, dyspnea on exertion, edema, orthopnea, palpitations, paroxysmal nocturnal dyspnea. Dermatological: No rash, lesions/masses Respiratory: No cough, dyspnea Urologic/Reproductive: No hematuria, dysuria. Positive for vaginal bleeding.  Abdominal:   No nausea, vomiting, diarrhea, bright red blood per rectum, melena, or hematemesis Neurologic:  No visual changes, wkns, changes in mental status. All other systems reviewed and are otherwise negative except as noted above.   Physical Exam:    VS:  BP 116/72    Pulse (!) 56   Ht 5\' 4"  (1.626 m)    General: Well developed, elderly African American female appearing in no acute distress. Head: Normocephalic, atraumatic, sclera non-icteric, no xanthomas, nares are without discharge.  Neck: No carotid bruits. JVD not elevated.  Lungs: Respirations regular and unlabored, without wheezes or rales.  Heart: Regular rate and rhythm. No S3 or S4.  No murmur, no rubs, or gallops appreciated. Abdomen: Soft, non-tender, non-distended with normoactive bowel sounds. No hepatomegaly. No rebound/guarding. No obvious abdominal masses. Msk:  Strength and tone appear normal for age. No joint deformities or effusions. Extremities: No clubbing or  cyanosis. Trace lower extremity edema.  Distal pedal pulses are 2+ bilaterally. Neuro: Alert and oriented X 3. Moves all extremities spontaneously. No focal deficits noted. Psych:  Responds to questions appropriately with a normal affect. Skin: No rashes or lesions noted  Wt Readings from Last 3 Encounters:  07/23/15 186 lb (84.4 kg)  05/10/15 201 lb 6.1 oz (91.3 kg)  02/23/15 200 lb (90.7 kg)     Studies/Labs Reviewed:   EKG:  EKG is ordered today.  The ekg ordered today demonstrates NSR, HR 56, LAD with LAFB. No acute ST or T-wave changes when compared to prior tracings.   Recent Labs: No results found for requested labs within last 8760 hours.   Lipid Panel    Component Value Date/Time   CHOL 142 12/11/2014 1005   CHOL 102 12/12/2012 0419   TRIG 95 12/11/2014 1005   TRIG 65 12/12/2012 0419   HDL 37 (L) 12/11/2014 1005   HDL 32 (L) 12/12/2012 0419   CHOLHDL 3.8 12/11/2014 1005   VLDL 13 12/12/2012 0419   LDLCALC 86 12/11/2014 1005   LDLCALC 57 12/12/2012 0419    Additional studies/ records that were reviewed today include:   NST: 12/2014  Nuclear stress EF: 65%.  There was no ST segment deviation noted during stress.  Defect 1: There is a large defect of severe severity present in the basal  anterolateral, mid anterolateral, apical lateral and apex location.  Findings consistent with prior lateral wall myocardial infarction with a small amount of peri -infarct ischemia.  This is a low risk study. this is unchanged from previous study in 2014.  Assessment:    1. Preoperative cardiovascular examination   2. Coronary artery disease involving native coronary artery of native heart without angina pectoris   3. Chronic diastolic heart failure ([place])   4. Essential (primary) hypertension   5. CKD (chronic kidney disease) stage 3, GFR 30-59 ml/min      Plan:   In order of problems listed above:  1. Preoperative Cardiac Clearance for D&C with Hysteroscopy - has experienced episodes of vaginal bleeding with associated anemia. Is being followed by OB-GYN and the plan is for an endometrial biopsy per [facility] with hysteroscopy as there is concern she may have endometrial cancer.  - she denies any recent episodes of chest discomfort or dyspnea with exertion. Last ischemic evaluation was a low-risk NST in 12/2014 and EF is preserved by echo in 12/2014. - with the procedure itself being low-risk from a cardiac perspective and no recent anginal symptoms, no ischemic evaluation is indicated prior to her procedure. May proceed from a cardiac perspective.  2. CAD - s/p CABG in 2008 with LIMA-LAD, SVG-D1-OM1, and SVG-PDA. NST in 12/2014 showed a prior lateral wall infarction with a small amount of per-infarct ischemia which was unchanged from prior imaging in 2014. - EKG today is without acute ischemic changes. He denies any recent episodes of chest discomfort or dyspnea with exertion. - continue ASA, BB, and statin therapy.   3. Chronic diastolic CHF - EF 0000000 by echo in 12/2014. - does not appear volume overloaded on physical exam. - continue BB, ACE-I, and Lasix. Diuretic dosing adjusted by Nephrology.   4. HTN - BP well-controlled at 116/76 today, - continue Coreg 6.25mg  BID, Lasix 80mg   daily, and Lisinopril 40mg  daily.  5. Stage 3 CKD - creatinine 1.4 - 1.6 earlier this month by review of Care Everywhere.  - Followed by Nephrology as an outpatient.    Medication  Adjustments/Labs and Tests Ordered: Current medicines are reviewed at length with the patient today.  Concerns regarding medicines are outlined above.  Medication changes, Labs and Tests ordered today are listed in the Patient Instructions below. Patient Instructions  Bernerd Pho, [place] recommends that you schedule a follow-up appointment in JUNE 2018. You will receive a reminder letter in the mail two months in advance. If you don't receive a letter, please call our office to schedule the follow-up appointment.  If you need a refill on your cardiac medications before your next appointment, please call your pharmacy.   Arna Medici, [place]  02/22/2016 5:21 PM    [facility] [address], [place], [place]  [address] Phone: [phone]; Fax: [phone]  [address], [place], [place] [address] Phone: [phone]

## 2016-02-22 ENCOUNTER — Encounter: Payer: Self-pay | Admitting: Student

## 2016-02-22 ENCOUNTER — Other Ambulatory Visit: Payer: Self-pay | Admitting: *Deleted

## 2016-02-22 ENCOUNTER — Ambulatory Visit (INDEPENDENT_AMBULATORY_CARE_PROVIDER_SITE_OTHER): Payer: Commercial Managed Care - HMO | Admitting: Student

## 2016-02-22 VITALS — BP 116/72 | HR 56 | Ht 64.0 in

## 2016-02-22 DIAGNOSIS — N183 Chronic kidney disease, stage 3 unspecified: Secondary | ICD-10-CM

## 2016-02-22 DIAGNOSIS — I2510 Atherosclerotic heart disease of native coronary artery without angina pectoris: Secondary | ICD-10-CM | POA: Diagnosis not present

## 2016-02-22 DIAGNOSIS — Z01810 Encounter for preprocedural cardiovascular examination: Secondary | ICD-10-CM | POA: Insufficient documentation

## 2016-02-22 DIAGNOSIS — I10 Essential (primary) hypertension: Secondary | ICD-10-CM

## 2016-02-22 DIAGNOSIS — I5032 Chronic diastolic (congestive) heart failure: Secondary | ICD-10-CM | POA: Diagnosis not present

## 2016-02-22 NOTE — Patient Instructions (Signed)
Bernerd Pho, [place] recommends that you schedule a follow-up appointment in JUNE 2018. You will receive a reminder letter in the mail two months in advance. If you don't receive a letter, please call our office to schedule the follow-up appointment.  If you need a refill on your cardiac medications before your next appointment, please call your pharmacy.

## 2016-02-23 NOTE — Patient Outreach (Signed)
  [facility]) Care Management  [facility] Social Work  02/23/2016  VITINA RAD 09-20-1942 [id number]  Subjective:  "doing fairly well- was in hospital about a week ago".  Objective:  CSW following for community resource/support.  Current Medications:  Current Outpatient Prescriptions  Medication Sig Dispense Refill  . ACCU-CHEK SOFTCLIX LANCETS lancets CHECK BLOOD SUGARS TWICE DAILY 200 each 12  . aspirin 81 MG tablet Take 81 mg by mouth daily.    . carvedilol (COREG) 6.25 MG tablet Take 6.25 mg by mouth 2 (two) times daily with a meal.    . furosemide (LASIX) 40 MG tablet TAKE 2 TABLETS BY MOUTH EVERY MORNING FOR FLUID 60 tablet 6  . insulin aspart (NOVOLOG FLEXPEN) 100 UNIT/ML FlexPen Inject into the skin 3 (three) times daily with meals. Sliding Scale 2 units-6 units patient has list for 100-400 BS readings  100-200, 2 units, 201-250, 4 units, 251--300 6 units, 301-350, 8 units, 351 to 400 10 units.    . Insulin NPH Isophane & Regular (NOVOLIN 70/30 [place]) Inject 40-50 Units into the skin. 50 units every morning  40 units every evening    . Insulin Pen Needle (RELION SHORT PEN NEEDLES) 31G X 8 MM MISC 1 each by Does not apply route 2 (two) times daily. 50 each 11  . lisinopril (PRINIVIL,ZESTRIL) 40 MG tablet TAKE 1 TABLET DAILY 30 tablet 0  . polyethylene glycol (MIRALAX / GLYCOLAX) packet USE 1-2 PACKETS DAILY 60 packet 0  . pravastatin (PRAVACHOL) 40 MG tablet TAKE 1 TABLET BY MOUTH AT BEDTIME 30 tablet 3  . ranitidine (ZANTAC) 150 MG tablet Take 150 mg by mouth 2 (two) times daily.     No current facility-administered medications for this visit.     Functional Status:  In your present state of health, do you have any difficulty performing the following activities: 02/17/2016 12/28/2015  Hearing? N N  Vision? Y Y  Difficulty concentrating or making decisions? Y N  Walking or climbing stairs? Y Y  Dressing or bathing? Y Y  Doing errands, shopping? [name]   Preparing Food and eating ? Y Y  Using the Toilet? Y Y  In the past six months, have you accidently leaked urine? Y N  Do you have problems with loss of bowel control? Y N  Managing your Medications? Y Y  Managing your Finances? [name]  Housekeeping or managing your Housekeeping? [name]  Some recent data might be hidden    Fall/Depression Screening:  PHQ 2/9 Scores 12/28/2015 11/22/2015 11/18/2015 02/23/2015 11/03/2014 07/28/2014  PHQ - 2 Score 0 1 1 0 0 0    Assessment:  Patient reports she was hospitalized at [facility] about one week ago for "some bleeding". She is not able to elaborate much and reports her 17yo husband is outside getting snow off the walkway.  Patient believes she has [place] therapies to start up post hospital stay but is not certain. "I want some more therapy". Encouraged her to ask PCP about this if no follow up from [facility] is rec'd.  Patient is still requiring assistance from her family and reports no concerns or problems with that.  She has not been able to find any private duty care; still relying on family for bathing,etc.   Plan:  CSW will update RN of recent hospital stay and plan f/u call next week.    [name], MSW, [place] Worker  [place] [phone]

## 2016-02-25 ENCOUNTER — Other Ambulatory Visit: Payer: Self-pay | Admitting: *Deleted

## 2016-02-25 NOTE — Patient Outreach (Signed)
[place] [facility]) Care Management  02/25/2016  KARYNE CUNNIFF Nov 12, 1931 [id number]   Transition of care call   Spoke with patient reports she is doing moving slow but doing pretty good. Patient discussed her recent visit at GYN office she is still awaiting results of biopsy, patient states she is still just having a little spotting, but states she  has prescription filled for tranexamic acid filled if needed for  bleeding reoccurrence. .  Patient report she attended visit at PCP on Monday regarding wound at inside of right 5 th toe, patient reports she was  instructed to keep site clean and was give gauze to keep between toes. Patient states her husband is able to help her with washing her feet.   Patient reports she is taking her medications daily per blister packaging Patient reports her daily blood sugars are ranging in the 180's- 200, taking her 70/30 insulin as prescribed, reports still not taking novolog for blood sugars over 200's.   Denies any new concern at this time .   Plan Will follow up with patient in the next week by telephone for transition of care, and assess for need of arranging sooner home visit .   [name], RN, [place] Management [phone]- Mobile [phone]- Toll Free Main Office

## 2016-03-02 ENCOUNTER — Other Ambulatory Visit: Payer: Self-pay | Admitting: *Deleted

## 2016-03-02 ENCOUNTER — Ambulatory Visit: Payer: Self-pay | Admitting: *Deleted

## 2016-03-02 NOTE — Patient Outreach (Signed)
[place] [facility]) Care Management  03/02/2016  [name] [name] [birth year] [id number]   Transition of care call  Spoke with patient reports she is doing fairly well hanging in there.  Patient discussed one episode of low blood sugar symptoms on last week at night, patient unable to recall what reading was, she had a half cup of juice, felt better and did not recheck until the day and it was good  . Patient does not keep a record of blood sugars "I think I am doing good checking my blood sugar. Patient states her blood sugars have been in the 150's range and she is taking her daily insulin as prescribed. Discussed with patient having a snack at bedtime to help prevent  Hypoglycemia, will reinforce education on treating hypoglycemia.    Patient states her toe on right foot  with the open area  if feeling better and her sister in law, helps her with washing her feet and changing gauze in between every other day.  Patient states she has not heard from result from her recent pap smear exam yet. Patient states no bleeding noted , just one spot each morning and evening noted on her pad.   Plan Will plan weekly transition of care call on next week. Will update care plan progression to goals,    [name], RN, [place] Management [phone]- Mobile [phone]- [place]

## 2016-03-06 ENCOUNTER — Other Ambulatory Visit: Payer: Self-pay | Admitting: *Deleted

## 2016-03-06 NOTE — Patient Outreach (Signed)
[place] [facility]) Care Management  03/06/2016  [name] [name] [birth year] [id number]   CSW contacted patient by phone today and was asked to call back as she was not available. CSW will try again later this week.   [name], MSW, [place] Worker  [place] [phone]

## 2016-03-07 DIAGNOSIS — I130 Hypertensive heart and chronic kidney disease with heart failure and stage 1 through stage 4 chronic kidney disease, or unspecified chronic kidney disease: Secondary | ICD-10-CM | POA: Diagnosis not present

## 2016-03-07 DIAGNOSIS — I2510 Atherosclerotic heart disease of native coronary artery without angina pectoris: Secondary | ICD-10-CM | POA: Diagnosis not present

## 2016-03-07 DIAGNOSIS — I5032 Chronic diastolic (congestive) heart failure: Secondary | ICD-10-CM | POA: Diagnosis not present

## 2016-03-07 DIAGNOSIS — I69398 Other sequelae of cerebral infarction: Secondary | ICD-10-CM | POA: Diagnosis not present

## 2016-03-07 DIAGNOSIS — E1151 Type 2 diabetes mellitus with diabetic peripheral angiopathy without gangrene: Secondary | ICD-10-CM | POA: Diagnosis not present

## 2016-03-07 DIAGNOSIS — I69354 Hemiplegia and hemiparesis following cerebral infarction affecting left non-dominant side: Secondary | ICD-10-CM | POA: Diagnosis not present

## 2016-03-07 DIAGNOSIS — E1122 Type 2 diabetes mellitus with diabetic chronic kidney disease: Secondary | ICD-10-CM | POA: Diagnosis not present

## 2016-03-07 DIAGNOSIS — N184 Chronic kidney disease, stage 4 (severe): Secondary | ICD-10-CM | POA: Diagnosis not present

## 2016-03-07 DIAGNOSIS — M6281 Muscle weakness (generalized): Secondary | ICD-10-CM | POA: Diagnosis not present

## 2016-03-08 ENCOUNTER — Other Ambulatory Visit: Payer: Self-pay | Admitting: *Deleted

## 2016-03-08 DIAGNOSIS — I2510 Atherosclerotic heart disease of native coronary artery without angina pectoris: Secondary | ICD-10-CM | POA: Diagnosis not present

## 2016-03-08 DIAGNOSIS — I69398 Other sequelae of cerebral infarction: Secondary | ICD-10-CM | POA: Diagnosis not present

## 2016-03-08 DIAGNOSIS — M6281 Muscle weakness (generalized): Secondary | ICD-10-CM | POA: Diagnosis not present

## 2016-03-08 DIAGNOSIS — N184 Chronic kidney disease, stage 4 (severe): Secondary | ICD-10-CM | POA: Diagnosis not present

## 2016-03-08 DIAGNOSIS — E1122 Type 2 diabetes mellitus with diabetic chronic kidney disease: Secondary | ICD-10-CM | POA: Diagnosis not present

## 2016-03-08 DIAGNOSIS — E1151 Type 2 diabetes mellitus with diabetic peripheral angiopathy without gangrene: Secondary | ICD-10-CM | POA: Diagnosis not present

## 2016-03-08 DIAGNOSIS — I130 Hypertensive heart and chronic kidney disease with heart failure and stage 1 through stage 4 chronic kidney disease, or unspecified chronic kidney disease: Secondary | ICD-10-CM | POA: Diagnosis not present

## 2016-03-08 DIAGNOSIS — I69354 Hemiplegia and hemiparesis following cerebral infarction affecting left non-dominant side: Secondary | ICD-10-CM | POA: Diagnosis not present

## 2016-03-08 DIAGNOSIS — I5032 Chronic diastolic (congestive) heart failure: Secondary | ICD-10-CM | POA: Diagnosis not present

## 2016-03-08 NOTE — Patient Outreach (Signed)
[place] [facility]) Care Management  03/08/2016  [name] [name] Mar 24, 1931 [id number]   Transition of care call  Placed call to patient   Spoke with patient reports she is making in. Patient discussed being happy to have home health physical therapy back to work with her. Patient is now followed by [facility] home health PT and OT both visited on today. Patient stated she wants to be able to do as much for her self as possible. Patient walked 50 feet with therapy on today.  Patient states she sister in law, still helps her with bath a few days a week, patient  discussed someone,(she can't remember who)  was working on getting consolidated regional the service that helps her with house work 1.5 hours a week to help her with bathing.   Patient denies having any further bleeding, still awaiting results of biopsy.   Patient reports she continues to check her blood sugar twice daily,morning ranges are 120 and in the evening they are 200's. Patient is not taking sliding scale insulin , due to fear of having low blood sugar event, none in the last week.  Patient reports she eats a snack at night to avoid low blood sugar event , because she doesn't like the way it makes her feel. Discussed importance of blood sugar being controlled to help with preventing complications , helps improve healing of wounds.   Plan Will plan  transition of care call on next week, and schedule next home visit.    [name], RN, [place] Management [phone]- Mobile [phone]- Toll Free Main Office

## 2016-03-09 ENCOUNTER — Other Ambulatory Visit: Payer: Self-pay | Admitting: *Deleted

## 2016-03-09 ENCOUNTER — Encounter: Payer: Self-pay | Admitting: *Deleted

## 2016-03-09 DIAGNOSIS — I5032 Chronic diastolic (congestive) heart failure: Secondary | ICD-10-CM | POA: Diagnosis not present

## 2016-03-09 DIAGNOSIS — I130 Hypertensive heart and chronic kidney disease with heart failure and stage 1 through stage 4 chronic kidney disease, or unspecified chronic kidney disease: Secondary | ICD-10-CM | POA: Diagnosis not present

## 2016-03-09 DIAGNOSIS — I69354 Hemiplegia and hemiparesis following cerebral infarction affecting left non-dominant side: Secondary | ICD-10-CM | POA: Diagnosis not present

## 2016-03-09 DIAGNOSIS — N184 Chronic kidney disease, stage 4 (severe): Secondary | ICD-10-CM | POA: Diagnosis not present

## 2016-03-09 DIAGNOSIS — I69398 Other sequelae of cerebral infarction: Secondary | ICD-10-CM | POA: Diagnosis not present

## 2016-03-09 DIAGNOSIS — I2510 Atherosclerotic heart disease of native coronary artery without angina pectoris: Secondary | ICD-10-CM | POA: Diagnosis not present

## 2016-03-09 DIAGNOSIS — M6281 Muscle weakness (generalized): Secondary | ICD-10-CM | POA: Diagnosis not present

## 2016-03-09 DIAGNOSIS — E1122 Type 2 diabetes mellitus with diabetic chronic kidney disease: Secondary | ICD-10-CM | POA: Diagnosis not present

## 2016-03-09 DIAGNOSIS — E1151 Type 2 diabetes mellitus with diabetic peripheral angiopathy without gangrene: Secondary | ICD-10-CM | POA: Diagnosis not present

## 2016-03-09 NOTE — Patient Outreach (Signed)
  [place] [facility]) Care Management  [facility] Social Work  03/09/2016  [name] [name] 1932-02-03 [id number]  Subjective:  "I am doing alright". She reports she is using the life alert.   Objective:  Patient linked with community resources for home support to aide in her care/safety.   Current Medications:  Current Outpatient Prescriptions  Medication Sig Dispense Refill  . ACCU-CHEK SOFTCLIX LANCETS lancets CHECK BLOOD SUGARS TWICE DAILY 200 each 12  . aspirin 81 MG tablet Take 81 mg by mouth daily.    . carvedilol (COREG) 6.25 MG tablet Take 6.25 mg by mouth 2 (two) times daily with a meal.    . furosemide (LASIX) 40 MG tablet TAKE 2 TABLETS BY MOUTH EVERY MORNING FOR FLUID 60 tablet 6  . insulin aspart (NOVOLOG FLEXPEN) 100 UNIT/ML FlexPen Inject into the skin 3 (three) times daily with meals. Sliding Scale 2 units-6 units patient has list for 100-400 BS readings  100-200, 2 units, 201-250, 4 units, 251--300 6 units, 301-350, 8 units, 351 to 400 10 units.    . Insulin NPH Isophane & Regular (NOVOLIN 70/30 [place]) Inject 40-50 Units into the skin. 50 units every morning  40 units every evening    . Insulin Pen Needle (RELION SHORT PEN NEEDLES) 31G X 8 MM MISC 1 each by Does not apply route 2 (two) times daily. 50 each 11  . lisinopril (PRINIVIL,ZESTRIL) 40 MG tablet TAKE 1 TABLET DAILY 30 tablet 0  . polyethylene glycol (MIRALAX / GLYCOLAX) packet USE 1-2 PACKETS DAILY 60 packet 0  . pravastatin (PRAVACHOL) 40 MG tablet TAKE 1 TABLET BY MOUTH AT BEDTIME 30 tablet 3  . ranitidine (ZANTAC) 150 MG tablet Take 150 mg by mouth 2 (two) times daily.     No current facility-administered medications for this visit.     Functional Status:  In your present state of health, do you have any difficulty performing the following activities: 02/17/2016 12/28/2015  Hearing? N N  Vision? Y Y  Difficulty concentrating or making decisions? Y N  Walking or climbing stairs? Y Y  Dressing or  bathing? Y Y  Doing errands, shopping? [name]  Preparing Food and eating ? Y Y  Using the Toilet? Y Y  In the past six months, have you accidently leaked urine? Y N  Do you have problems with loss of bowel control? Y N  Managing your Medications? Y Y  Managing your Finances? [name]  Housekeeping or managing your Housekeeping? [name]  Some recent data might be hidden    Fall/Depression Screening:  PHQ 2/9 Scores 12/28/2015 11/22/2015 11/18/2015 02/23/2015 11/03/2014 07/28/2014  PHQ - 2 Score 0 1 1 0 0 0    Assessment:  Patient reports "no bleeding"- she now is monitoring a toe sore; "[name] is aware of it'. She reports no concerns or further needs from CSW- case closure discussed. Referred to [facility] for  personal care and housekeeping assist.  Plan: CSW plans case closure as goals met- CSW will advise [facility] Team and PCP.     [name], MSW, [place] Worker  [place] [phone]

## 2016-03-10 DIAGNOSIS — R531 Weakness: Secondary | ICD-10-CM | POA: Diagnosis not present

## 2016-03-10 DIAGNOSIS — N390 Urinary tract infection, site not specified: Secondary | ICD-10-CM | POA: Diagnosis not present

## 2016-03-13 DIAGNOSIS — N390 Urinary tract infection, site not specified: Secondary | ICD-10-CM | POA: Diagnosis not present

## 2016-03-13 DIAGNOSIS — I69398 Other sequelae of cerebral infarction: Secondary | ICD-10-CM | POA: Diagnosis not present

## 2016-03-13 DIAGNOSIS — I5032 Chronic diastolic (congestive) heart failure: Secondary | ICD-10-CM | POA: Diagnosis not present

## 2016-03-13 DIAGNOSIS — I2510 Atherosclerotic heart disease of native coronary artery without angina pectoris: Secondary | ICD-10-CM | POA: Diagnosis not present

## 2016-03-13 DIAGNOSIS — E1122 Type 2 diabetes mellitus with diabetic chronic kidney disease: Secondary | ICD-10-CM | POA: Diagnosis not present

## 2016-03-13 DIAGNOSIS — M6281 Muscle weakness (generalized): Secondary | ICD-10-CM | POA: Diagnosis not present

## 2016-03-13 DIAGNOSIS — I130 Hypertensive heart and chronic kidney disease with heart failure and stage 1 through stage 4 chronic kidney disease, or unspecified chronic kidney disease: Secondary | ICD-10-CM | POA: Diagnosis not present

## 2016-03-13 DIAGNOSIS — I69354 Hemiplegia and hemiparesis following cerebral infarction affecting left non-dominant side: Secondary | ICD-10-CM | POA: Diagnosis not present

## 2016-03-13 DIAGNOSIS — E1151 Type 2 diabetes mellitus with diabetic peripheral angiopathy without gangrene: Secondary | ICD-10-CM | POA: Diagnosis not present

## 2016-03-13 DIAGNOSIS — N184 Chronic kidney disease, stage 4 (severe): Secondary | ICD-10-CM | POA: Diagnosis not present

## 2016-03-14 DIAGNOSIS — M6281 Muscle weakness (generalized): Secondary | ICD-10-CM | POA: Diagnosis not present

## 2016-03-14 DIAGNOSIS — I5032 Chronic diastolic (congestive) heart failure: Secondary | ICD-10-CM | POA: Diagnosis not present

## 2016-03-14 DIAGNOSIS — N184 Chronic kidney disease, stage 4 (severe): Secondary | ICD-10-CM | POA: Diagnosis not present

## 2016-03-14 DIAGNOSIS — E1122 Type 2 diabetes mellitus with diabetic chronic kidney disease: Secondary | ICD-10-CM | POA: Diagnosis not present

## 2016-03-14 DIAGNOSIS — I130 Hypertensive heart and chronic kidney disease with heart failure and stage 1 through stage 4 chronic kidney disease, or unspecified chronic kidney disease: Secondary | ICD-10-CM | POA: Diagnosis not present

## 2016-03-14 DIAGNOSIS — I69398 Other sequelae of cerebral infarction: Secondary | ICD-10-CM | POA: Diagnosis not present

## 2016-03-14 DIAGNOSIS — I69354 Hemiplegia and hemiparesis following cerebral infarction affecting left non-dominant side: Secondary | ICD-10-CM | POA: Diagnosis not present

## 2016-03-14 DIAGNOSIS — I2510 Atherosclerotic heart disease of native coronary artery without angina pectoris: Secondary | ICD-10-CM | POA: Diagnosis not present

## 2016-03-14 DIAGNOSIS — E1151 Type 2 diabetes mellitus with diabetic peripheral angiopathy without gangrene: Secondary | ICD-10-CM | POA: Diagnosis not present

## 2016-03-15 ENCOUNTER — Other Ambulatory Visit: Payer: Self-pay | Admitting: *Deleted

## 2016-03-15 DIAGNOSIS — E1151 Type 2 diabetes mellitus with diabetic peripheral angiopathy without gangrene: Secondary | ICD-10-CM | POA: Diagnosis not present

## 2016-03-15 DIAGNOSIS — I5032 Chronic diastolic (congestive) heart failure: Secondary | ICD-10-CM | POA: Diagnosis not present

## 2016-03-15 DIAGNOSIS — I130 Hypertensive heart and chronic kidney disease with heart failure and stage 1 through stage 4 chronic kidney disease, or unspecified chronic kidney disease: Secondary | ICD-10-CM | POA: Diagnosis not present

## 2016-03-15 DIAGNOSIS — M6281 Muscle weakness (generalized): Secondary | ICD-10-CM | POA: Diagnosis not present

## 2016-03-15 DIAGNOSIS — E1122 Type 2 diabetes mellitus with diabetic chronic kidney disease: Secondary | ICD-10-CM | POA: Diagnosis not present

## 2016-03-15 DIAGNOSIS — N184 Chronic kidney disease, stage 4 (severe): Secondary | ICD-10-CM | POA: Diagnosis not present

## 2016-03-15 DIAGNOSIS — I69398 Other sequelae of cerebral infarction: Secondary | ICD-10-CM | POA: Diagnosis not present

## 2016-03-15 DIAGNOSIS — I69354 Hemiplegia and hemiparesis following cerebral infarction affecting left non-dominant side: Secondary | ICD-10-CM | POA: Diagnosis not present

## 2016-03-15 DIAGNOSIS — I2510 Atherosclerotic heart disease of native coronary artery without angina pectoris: Secondary | ICD-10-CM | POA: Diagnosis not present

## 2016-03-15 NOTE — Patient Outreach (Signed)
[place] [facility]) Care Management  03/15/2016  [name] [name] 04-14-1931 [id number]   Transition of care call   Placed call to patient , she discussed her problem with constipation, reports she had a small bowel movement on yesterday, prior to that I had been a week. Patient states she has been taking miralax every day, and just feels like she needs to have another bowel movement.  Patient states home health OT has just arrived and she placed the person on the phone , OT states patient complained of constipation on Monday and she called MD , states she plans to notify MD office again today of patient complaint, patient is also anticipating home health RN visit on today.    Plan Will follow up with patient in the next 3 days by telephone regarding progress and plan for next home visit.   [name], RN, [place] Management [phone]- Mobile [phone]- Toll Free Main Office

## 2016-03-16 DIAGNOSIS — E1122 Type 2 diabetes mellitus with diabetic chronic kidney disease: Secondary | ICD-10-CM | POA: Diagnosis not present

## 2016-03-16 DIAGNOSIS — M6281 Muscle weakness (generalized): Secondary | ICD-10-CM | POA: Diagnosis not present

## 2016-03-16 DIAGNOSIS — I130 Hypertensive heart and chronic kidney disease with heart failure and stage 1 through stage 4 chronic kidney disease, or unspecified chronic kidney disease: Secondary | ICD-10-CM | POA: Diagnosis not present

## 2016-03-16 DIAGNOSIS — E1151 Type 2 diabetes mellitus with diabetic peripheral angiopathy without gangrene: Secondary | ICD-10-CM | POA: Diagnosis not present

## 2016-03-16 DIAGNOSIS — N184 Chronic kidney disease, stage 4 (severe): Secondary | ICD-10-CM | POA: Diagnosis not present

## 2016-03-16 DIAGNOSIS — I2510 Atherosclerotic heart disease of native coronary artery without angina pectoris: Secondary | ICD-10-CM | POA: Diagnosis not present

## 2016-03-16 DIAGNOSIS — I5032 Chronic diastolic (congestive) heart failure: Secondary | ICD-10-CM | POA: Diagnosis not present

## 2016-03-16 DIAGNOSIS — I69354 Hemiplegia and hemiparesis following cerebral infarction affecting left non-dominant side: Secondary | ICD-10-CM | POA: Diagnosis not present

## 2016-03-16 DIAGNOSIS — I69398 Other sequelae of cerebral infarction: Secondary | ICD-10-CM | POA: Diagnosis not present

## 2016-03-17 ENCOUNTER — Other Ambulatory Visit: Payer: Self-pay | Admitting: *Deleted

## 2016-03-17 NOTE — Patient Outreach (Addendum)
[place] [facility]) Care Management  03/17/2016  [name] [name] 13-Sep-1931 [id number]  Telephone assessment  Placed call to follow up, no ringing busy, no answer unable to leave a message.  Attempted to return call x 2 today.   Plan Will await return call , if no response will attempt return call in next week to schedule next home visit.    [name], RN, [place] Management [phone]- Mobile [phone]- Toll Free Main Office

## 2016-03-20 DIAGNOSIS — E1122 Type 2 diabetes mellitus with diabetic chronic kidney disease: Secondary | ICD-10-CM | POA: Diagnosis not present

## 2016-03-20 DIAGNOSIS — M6281 Muscle weakness (generalized): Secondary | ICD-10-CM | POA: Diagnosis not present

## 2016-03-20 DIAGNOSIS — N184 Chronic kidney disease, stage 4 (severe): Secondary | ICD-10-CM | POA: Diagnosis not present

## 2016-03-20 DIAGNOSIS — E1151 Type 2 diabetes mellitus with diabetic peripheral angiopathy without gangrene: Secondary | ICD-10-CM | POA: Diagnosis not present

## 2016-03-20 DIAGNOSIS — I130 Hypertensive heart and chronic kidney disease with heart failure and stage 1 through stage 4 chronic kidney disease, or unspecified chronic kidney disease: Secondary | ICD-10-CM | POA: Diagnosis not present

## 2016-03-20 DIAGNOSIS — I2510 Atherosclerotic heart disease of native coronary artery without angina pectoris: Secondary | ICD-10-CM | POA: Diagnosis not present

## 2016-03-20 DIAGNOSIS — I5032 Chronic diastolic (congestive) heart failure: Secondary | ICD-10-CM | POA: Diagnosis not present

## 2016-03-20 DIAGNOSIS — I69354 Hemiplegia and hemiparesis following cerebral infarction affecting left non-dominant side: Secondary | ICD-10-CM | POA: Diagnosis not present

## 2016-03-20 DIAGNOSIS — I69398 Other sequelae of cerebral infarction: Secondary | ICD-10-CM | POA: Diagnosis not present

## 2016-03-21 ENCOUNTER — Other Ambulatory Visit: Payer: Self-pay | Admitting: *Deleted

## 2016-03-21 DIAGNOSIS — I69354 Hemiplegia and hemiparesis following cerebral infarction affecting left non-dominant side: Secondary | ICD-10-CM | POA: Diagnosis not present

## 2016-03-21 DIAGNOSIS — I69398 Other sequelae of cerebral infarction: Secondary | ICD-10-CM | POA: Diagnosis not present

## 2016-03-21 DIAGNOSIS — I5032 Chronic diastolic (congestive) heart failure: Secondary | ICD-10-CM | POA: Diagnosis not present

## 2016-03-21 DIAGNOSIS — N184 Chronic kidney disease, stage 4 (severe): Secondary | ICD-10-CM | POA: Diagnosis not present

## 2016-03-21 DIAGNOSIS — M6281 Muscle weakness (generalized): Secondary | ICD-10-CM | POA: Diagnosis not present

## 2016-03-21 DIAGNOSIS — E1122 Type 2 diabetes mellitus with diabetic chronic kidney disease: Secondary | ICD-10-CM | POA: Diagnosis not present

## 2016-03-21 DIAGNOSIS — I2510 Atherosclerotic heart disease of native coronary artery without angina pectoris: Secondary | ICD-10-CM | POA: Diagnosis not present

## 2016-03-21 DIAGNOSIS — I130 Hypertensive heart and chronic kidney disease with heart failure and stage 1 through stage 4 chronic kidney disease, or unspecified chronic kidney disease: Secondary | ICD-10-CM | POA: Diagnosis not present

## 2016-03-21 DIAGNOSIS — E1151 Type 2 diabetes mellitus with diabetic peripheral angiopathy without gangrene: Secondary | ICD-10-CM | POA: Diagnosis not present

## 2016-03-21 NOTE — Patient Outreach (Signed)
[place] [facility]) Care Management  03/21/2016  MILANIA HAMRIC Oct 04, 1931 [id number]  1500 Placed call to patient to follow up, phone initally rings then is hung up x 2 tries.  1630  Attempt again to contact patient, phone just rings , unable to leave a message.     Plan  Will place on schedule for follow up call within the next week.    [name], RN, [place] Management [phone]- Mobile [phone]- Toll Free Main Office

## 2016-03-22 DIAGNOSIS — E1122 Type 2 diabetes mellitus with diabetic chronic kidney disease: Secondary | ICD-10-CM | POA: Diagnosis not present

## 2016-03-22 DIAGNOSIS — E1151 Type 2 diabetes mellitus with diabetic peripheral angiopathy without gangrene: Secondary | ICD-10-CM | POA: Diagnosis not present

## 2016-03-22 DIAGNOSIS — N184 Chronic kidney disease, stage 4 (severe): Secondary | ICD-10-CM | POA: Diagnosis not present

## 2016-03-22 DIAGNOSIS — I69398 Other sequelae of cerebral infarction: Secondary | ICD-10-CM | POA: Diagnosis not present

## 2016-03-22 DIAGNOSIS — I130 Hypertensive heart and chronic kidney disease with heart failure and stage 1 through stage 4 chronic kidney disease, or unspecified chronic kidney disease: Secondary | ICD-10-CM | POA: Diagnosis not present

## 2016-03-22 DIAGNOSIS — I5032 Chronic diastolic (congestive) heart failure: Secondary | ICD-10-CM | POA: Diagnosis not present

## 2016-03-22 DIAGNOSIS — I69354 Hemiplegia and hemiparesis following cerebral infarction affecting left non-dominant side: Secondary | ICD-10-CM | POA: Diagnosis not present

## 2016-03-22 DIAGNOSIS — I2510 Atherosclerotic heart disease of native coronary artery without angina pectoris: Secondary | ICD-10-CM | POA: Diagnosis not present

## 2016-03-22 DIAGNOSIS — M6281 Muscle weakness (generalized): Secondary | ICD-10-CM | POA: Diagnosis not present

## 2016-03-23 DIAGNOSIS — E1151 Type 2 diabetes mellitus with diabetic peripheral angiopathy without gangrene: Secondary | ICD-10-CM | POA: Diagnosis not present

## 2016-03-23 DIAGNOSIS — N184 Chronic kidney disease, stage 4 (severe): Secondary | ICD-10-CM | POA: Diagnosis not present

## 2016-03-23 DIAGNOSIS — M6281 Muscle weakness (generalized): Secondary | ICD-10-CM | POA: Diagnosis not present

## 2016-03-23 DIAGNOSIS — E1122 Type 2 diabetes mellitus with diabetic chronic kidney disease: Secondary | ICD-10-CM | POA: Diagnosis not present

## 2016-03-23 DIAGNOSIS — I130 Hypertensive heart and chronic kidney disease with heart failure and stage 1 through stage 4 chronic kidney disease, or unspecified chronic kidney disease: Secondary | ICD-10-CM | POA: Diagnosis not present

## 2016-03-23 DIAGNOSIS — I2510 Atherosclerotic heart disease of native coronary artery without angina pectoris: Secondary | ICD-10-CM | POA: Diagnosis not present

## 2016-03-23 DIAGNOSIS — I69354 Hemiplegia and hemiparesis following cerebral infarction affecting left non-dominant side: Secondary | ICD-10-CM | POA: Diagnosis not present

## 2016-03-23 DIAGNOSIS — I69398 Other sequelae of cerebral infarction: Secondary | ICD-10-CM | POA: Diagnosis not present

## 2016-03-23 DIAGNOSIS — I5032 Chronic diastolic (congestive) heart failure: Secondary | ICD-10-CM | POA: Diagnosis not present

## 2016-03-27 ENCOUNTER — Other Ambulatory Visit: Payer: Self-pay | Admitting: *Deleted

## 2016-03-27 DIAGNOSIS — I129 Hypertensive chronic kidney disease with stage 1 through stage 4 chronic kidney disease, or unspecified chronic kidney disease: Secondary | ICD-10-CM | POA: Diagnosis not present

## 2016-03-27 DIAGNOSIS — N183 Chronic kidney disease, stage 3 (moderate): Secondary | ICD-10-CM | POA: Diagnosis not present

## 2016-03-27 DIAGNOSIS — E1129 Type 2 diabetes mellitus with other diabetic kidney complication: Secondary | ICD-10-CM | POA: Diagnosis not present

## 2016-03-27 DIAGNOSIS — R609 Edema, unspecified: Secondary | ICD-10-CM | POA: Diagnosis not present

## 2016-03-27 DIAGNOSIS — E1122 Type 2 diabetes mellitus with diabetic chronic kidney disease: Secondary | ICD-10-CM | POA: Diagnosis not present

## 2016-03-27 DIAGNOSIS — I10 Essential (primary) hypertension: Secondary | ICD-10-CM | POA: Diagnosis not present

## 2016-03-27 DIAGNOSIS — R600 Localized edema: Secondary | ICD-10-CM | POA: Diagnosis not present

## 2016-03-27 NOTE — Patient Outreach (Signed)
[place] [facility]) Care Management  03/27/2016  [name] [name] Jun 06, 1931 [id number]  Follow up Telephone call  Patient reports she is "making it", reports her blood sugars having been running pretty good less than 200, patient denies having any hypoglycemic episodes .Patient continues to check her blood sugar 2 to 3 times a day but does not keep a record.Patient discussed having a new solution in packet of strips and she is not sure what to do with it.  Patient discussed home health RN and therapist are still visiting her and anticipates home visit in the am. Patient discussed right toe area wound is healing.  Patient discussed visit to kidney doctor in [place] on today and plans to schedule follow up visit with Charlott Holler, NP. Patient reports her daughter will follow up with GYN on results her second pap smear results as she has not heard of results yet.   Plan Will schedule post transition of care home visit  In next week, as continuing to follow for complex case management.   Will follow up on Diabetes monitoring solution question at home visit.    [name], RN, [place] Management [phone]- Mobile [phone]- Toll Free Main Office

## 2016-03-28 DIAGNOSIS — I69354 Hemiplegia and hemiparesis following cerebral infarction affecting left non-dominant side: Secondary | ICD-10-CM | POA: Diagnosis not present

## 2016-03-28 DIAGNOSIS — I5032 Chronic diastolic (congestive) heart failure: Secondary | ICD-10-CM | POA: Diagnosis not present

## 2016-03-28 DIAGNOSIS — I69398 Other sequelae of cerebral infarction: Secondary | ICD-10-CM | POA: Diagnosis not present

## 2016-03-28 DIAGNOSIS — E1122 Type 2 diabetes mellitus with diabetic chronic kidney disease: Secondary | ICD-10-CM | POA: Diagnosis not present

## 2016-03-28 DIAGNOSIS — I130 Hypertensive heart and chronic kidney disease with heart failure and stage 1 through stage 4 chronic kidney disease, or unspecified chronic kidney disease: Secondary | ICD-10-CM | POA: Diagnosis not present

## 2016-03-28 DIAGNOSIS — I2510 Atherosclerotic heart disease of native coronary artery without angina pectoris: Secondary | ICD-10-CM | POA: Diagnosis not present

## 2016-03-28 DIAGNOSIS — N184 Chronic kidney disease, stage 4 (severe): Secondary | ICD-10-CM | POA: Diagnosis not present

## 2016-03-28 DIAGNOSIS — E1151 Type 2 diabetes mellitus with diabetic peripheral angiopathy without gangrene: Secondary | ICD-10-CM | POA: Diagnosis not present

## 2016-03-28 DIAGNOSIS — M6281 Muscle weakness (generalized): Secondary | ICD-10-CM | POA: Diagnosis not present

## 2016-03-29 DIAGNOSIS — I69354 Hemiplegia and hemiparesis following cerebral infarction affecting left non-dominant side: Secondary | ICD-10-CM | POA: Diagnosis not present

## 2016-03-29 DIAGNOSIS — I5032 Chronic diastolic (congestive) heart failure: Secondary | ICD-10-CM | POA: Diagnosis not present

## 2016-03-29 DIAGNOSIS — I69398 Other sequelae of cerebral infarction: Secondary | ICD-10-CM | POA: Diagnosis not present

## 2016-03-29 DIAGNOSIS — E1122 Type 2 diabetes mellitus with diabetic chronic kidney disease: Secondary | ICD-10-CM | POA: Diagnosis not present

## 2016-03-29 DIAGNOSIS — N184 Chronic kidney disease, stage 4 (severe): Secondary | ICD-10-CM | POA: Diagnosis not present

## 2016-03-29 DIAGNOSIS — M6281 Muscle weakness (generalized): Secondary | ICD-10-CM | POA: Diagnosis not present

## 2016-03-29 DIAGNOSIS — I130 Hypertensive heart and chronic kidney disease with heart failure and stage 1 through stage 4 chronic kidney disease, or unspecified chronic kidney disease: Secondary | ICD-10-CM | POA: Diagnosis not present

## 2016-03-29 DIAGNOSIS — I2510 Atherosclerotic heart disease of native coronary artery without angina pectoris: Secondary | ICD-10-CM | POA: Diagnosis not present

## 2016-03-29 DIAGNOSIS — E1151 Type 2 diabetes mellitus with diabetic peripheral angiopathy without gangrene: Secondary | ICD-10-CM | POA: Diagnosis not present

## 2016-03-30 DIAGNOSIS — E1151 Type 2 diabetes mellitus with diabetic peripheral angiopathy without gangrene: Secondary | ICD-10-CM | POA: Diagnosis not present

## 2016-03-30 DIAGNOSIS — I2510 Atherosclerotic heart disease of native coronary artery without angina pectoris: Secondary | ICD-10-CM | POA: Diagnosis not present

## 2016-03-30 DIAGNOSIS — I130 Hypertensive heart and chronic kidney disease with heart failure and stage 1 through stage 4 chronic kidney disease, or unspecified chronic kidney disease: Secondary | ICD-10-CM | POA: Diagnosis not present

## 2016-03-30 DIAGNOSIS — M6281 Muscle weakness (generalized): Secondary | ICD-10-CM | POA: Diagnosis not present

## 2016-03-30 DIAGNOSIS — I5032 Chronic diastolic (congestive) heart failure: Secondary | ICD-10-CM | POA: Diagnosis not present

## 2016-03-30 DIAGNOSIS — N184 Chronic kidney disease, stage 4 (severe): Secondary | ICD-10-CM | POA: Diagnosis not present

## 2016-03-30 DIAGNOSIS — I69398 Other sequelae of cerebral infarction: Secondary | ICD-10-CM | POA: Diagnosis not present

## 2016-03-30 DIAGNOSIS — I69354 Hemiplegia and hemiparesis following cerebral infarction affecting left non-dominant side: Secondary | ICD-10-CM | POA: Diagnosis not present

## 2016-03-30 DIAGNOSIS — E1122 Type 2 diabetes mellitus with diabetic chronic kidney disease: Secondary | ICD-10-CM | POA: Diagnosis not present

## 2016-03-31 DIAGNOSIS — H35373 Puckering of macula, bilateral: Secondary | ICD-10-CM | POA: Diagnosis not present

## 2016-03-31 DIAGNOSIS — H018 Other specified inflammations of eyelid: Secondary | ICD-10-CM | POA: Diagnosis not present

## 2016-04-03 DIAGNOSIS — E785 Hyperlipidemia, unspecified: Secondary | ICD-10-CM | POA: Diagnosis not present

## 2016-04-03 DIAGNOSIS — N184 Chronic kidney disease, stage 4 (severe): Secondary | ICD-10-CM | POA: Diagnosis not present

## 2016-04-03 DIAGNOSIS — N950 Postmenopausal bleeding: Secondary | ICD-10-CM | POA: Diagnosis not present

## 2016-04-03 DIAGNOSIS — E113593 Type 2 diabetes mellitus with proliferative diabetic retinopathy without macular edema, bilateral: Secondary | ICD-10-CM | POA: Diagnosis not present

## 2016-04-03 DIAGNOSIS — I509 Heart failure, unspecified: Secondary | ICD-10-CM | POA: Diagnosis not present

## 2016-04-04 DIAGNOSIS — I69398 Other sequelae of cerebral infarction: Secondary | ICD-10-CM | POA: Diagnosis not present

## 2016-04-04 DIAGNOSIS — I5032 Chronic diastolic (congestive) heart failure: Secondary | ICD-10-CM | POA: Diagnosis not present

## 2016-04-04 DIAGNOSIS — I130 Hypertensive heart and chronic kidney disease with heart failure and stage 1 through stage 4 chronic kidney disease, or unspecified chronic kidney disease: Secondary | ICD-10-CM | POA: Diagnosis not present

## 2016-04-04 DIAGNOSIS — E1122 Type 2 diabetes mellitus with diabetic chronic kidney disease: Secondary | ICD-10-CM | POA: Diagnosis not present

## 2016-04-04 DIAGNOSIS — M6281 Muscle weakness (generalized): Secondary | ICD-10-CM | POA: Diagnosis not present

## 2016-04-04 DIAGNOSIS — N184 Chronic kidney disease, stage 4 (severe): Secondary | ICD-10-CM | POA: Diagnosis not present

## 2016-04-04 DIAGNOSIS — I2510 Atherosclerotic heart disease of native coronary artery without angina pectoris: Secondary | ICD-10-CM | POA: Diagnosis not present

## 2016-04-04 DIAGNOSIS — E1151 Type 2 diabetes mellitus with diabetic peripheral angiopathy without gangrene: Secondary | ICD-10-CM | POA: Diagnosis not present

## 2016-04-04 DIAGNOSIS — I69354 Hemiplegia and hemiparesis following cerebral infarction affecting left non-dominant side: Secondary | ICD-10-CM | POA: Diagnosis not present

## 2016-04-05 ENCOUNTER — Other Ambulatory Visit: Payer: Self-pay | Admitting: *Deleted

## 2016-04-05 ENCOUNTER — Encounter: Payer: Self-pay | Admitting: *Deleted

## 2016-04-05 NOTE — Patient Outreach (Addendum)
[name] [name]) Care Management   04/05/2016  [name] [name] [birth year] [id number]  [name] [name] is an 83 y.o. female  Subjective:  Patient reports she is making it .  Patient discussed her blood sugar number is in the 7  range from labs at PCP visit. Patient discussed she checking her blood sugar at least 2 times a day . Patient discussed home health RN is still visiting at least twice a week and changing dressing to right foot toe area and reports site is healing. Patient discussed she just wants to get stronger.   Patient states she is able to stand on scales with assistance to weight daily. Patient discussed she has not noted vaginal bleeding, in the last month and is suppose to talk to gyn doctor on today, about next step.   Objective: BP 120/70 (BP Location: Left Leg, Patient Position: Sitting)   Pulse 60   Resp 18   Wt 200 lb (90.7 kg)   SpO2 96%   BMI 34.33 kg/m   Review of Systems  Constitutional: Negative.   HENT: Negative.   Eyes: Negative.   Respiratory: Negative.   Cardiovascular: Positive for leg swelling.  Gastrointestinal: Negative.   Genitourinary: Negative.   Skin: Negative.   Neurological: Negative.   Psychiatric/Behavioral: Negative.     Physical Exam  Constitutional: She is oriented to person, place, and time. She appears well-developed and well-nourished.  Cardiovascular: Normal rate and normal heart sounds.   Respiratory: Effort normal and breath sounds normal.  GI: Soft.  Musculoskeletal:  General weakness   Neurological: She is alert and oriented to person, place, and time.  Skin: Skin is warm and dry.     Psychiatric: She has a normal mood and affect. Her behavior is normal. Judgment and thought content normal.    Encounter Medications:   Outpatient Encounter Prescriptions as of 04/05/2016  Medication Sig Note  . ACCU-CHEK SOFTCLIX LANCETS lancets CHECK BLOOD SUGARS TWICE DAILY   . aspirin 81 MG tablet Take  81 mg by mouth daily.   . carvedilol (COREG) 6.25 MG tablet Take 6.25 mg by mouth 2 (two) times daily with a meal.   . furosemide (LASIX) 40 MG tablet TAKE 2 TABLETS BY MOUTH EVERY MORNING FOR FLUID 11/30/2015: 2 tablets in the morning and 2 tablets mid day   . insulin aspart (NOVOLOG FLEXPEN) 100 UNIT/ML FlexPen Inject into the skin 3 (three) times daily with meals. Sliding Scale 2 units-6 units patient has list for 100-400 BS readings  100-200, 2 units, 201-250, 4 units, 251--300 6 units, 301-350, 8 units, 351 to 400 10 units.   . Insulin NPH Isophane & Regular (NOVOLIN 70/30 [place]) Inject 40-50 Units into the skin. 50 units every morning  40 units every evening   . Insulin Pen Needle (RELION SHORT PEN NEEDLES) 31G X 8 MM MISC 1 each by Does not apply route 2 (two) times daily.   [name] lisinopril (PRINIVIL,ZESTRIL) 40 MG tablet TAKE 1 TABLET DAILY   . polyethylene glycol (MIRALAX / GLYCOLAX) packet USE 1-2 PACKETS DAILY   . pravastatin (PRAVACHOL) 40 MG tablet TAKE 1 TABLET BY MOUTH AT BEDTIME   . ranitidine (ZANTAC) 150 MG tablet Take 150 mg by mouth 2 (two) times daily.    No facility-administered encounter medications on file as of 04/05/2016.     Functional Status:   In your present state of health, do you have any difficulty performing the following activities: 02/17/2016 12/28/2015  Hearing? N N  Vision? Y Y  Difficulty concentrating or making decisions? Y N  Walking or climbing stairs? Y Y  Dressing or bathing? Y Y  Doing errands, shopping? [name]  Preparing Food and eating ? Y Y  Using the Toilet? Y Y  In the past six months, have you accidently leaked urine? Y N  Do you have problems with loss of bowel control? Y N  Managing your Medications? Y Y  Managing your Finances? [name]  Housekeeping or managing your Housekeeping? [name]  Some recent data might be hidden    Fall/Depression Screening:    PHQ 2/9 Scores 04/05/2016 12/28/2015 11/22/2015 11/18/2015 02/23/2015 11/03/2014 07/28/2014  PHQ -  2 Score 1 0 1 1 0 0 0   Fall Risk  02/17/2016 12/28/2015 11/22/2015 02/23/2015 11/03/2014  Falls in the past year? Yes - Yes No No  Number falls in past yr: 1 - 1 - -  Injury with Fall? No - No - -  Risk for fall due to : Impaired balance/gait;Impaired mobility;History of fall(s) History of fall(s) History of fall(s) - -  Follow up Falls prevention discussed - Falls prevention discussed - -   Assessment:  Routine home visit, patient's husband and sister in law present.  Patient is followed by [facility] personal care worker 2 days a week that assist with light housework and bathing assistance, on other week days her sister in law assist patient and visits daily.    Diabetes - consistently taking blood sugar, not keeping a written record , A1c in 7 range per patient report,, unable to verify  in kpn.  No hypoglycemic symptoms, needs review on treating hypoglycemia.   Heart Failure- weights stable, using scales from [facility], continues with some swelling in legs,limits salt in diet, does not add to foods.Discussed elevating legs some during the day as she sits in wheel chair all day. Taking medication as prescribed.   Fall Risk - no recent falls, home therapy goals met, patient reports working on exercises she has been taught.   Vaginal Bleeding- awaiting follow up call from gyn doctor for further  recommendations and discuss plan. Reinforced to notify MD of bleeding reoccurs .   Wound right toe area - managed by home health RN, family member available to assist with dressing change if needed.   Plan:  Will schedule home visit for next month and if no new concerns identified will discuss case closure Reviewed care management / coordination needs that have been met.  Patient centered care goals addressed.  Reviewed importance of notifying MD of new concerns , shortness of breath, increased swelling or sudden weight gain of 2 to 3 pounds in a day or 5 in a  week. Provided and reviewed EMMI video on Diabetes foot care.   [name], RN, [place] Management [phone]- Mobile [phone]- Toll Free Main Office

## 2016-04-10 DIAGNOSIS — I2510 Atherosclerotic heart disease of native coronary artery without angina pectoris: Secondary | ICD-10-CM | POA: Diagnosis not present

## 2016-04-10 DIAGNOSIS — I69398 Other sequelae of cerebral infarction: Secondary | ICD-10-CM | POA: Diagnosis not present

## 2016-04-10 DIAGNOSIS — I5032 Chronic diastolic (congestive) heart failure: Secondary | ICD-10-CM | POA: Diagnosis not present

## 2016-04-10 DIAGNOSIS — I69354 Hemiplegia and hemiparesis following cerebral infarction affecting left non-dominant side: Secondary | ICD-10-CM | POA: Diagnosis not present

## 2016-04-10 DIAGNOSIS — N184 Chronic kidney disease, stage 4 (severe): Secondary | ICD-10-CM | POA: Diagnosis not present

## 2016-04-10 DIAGNOSIS — E1151 Type 2 diabetes mellitus with diabetic peripheral angiopathy without gangrene: Secondary | ICD-10-CM | POA: Diagnosis not present

## 2016-04-10 DIAGNOSIS — E1122 Type 2 diabetes mellitus with diabetic chronic kidney disease: Secondary | ICD-10-CM | POA: Diagnosis not present

## 2016-04-10 DIAGNOSIS — M6281 Muscle weakness (generalized): Secondary | ICD-10-CM | POA: Diagnosis not present

## 2016-04-10 DIAGNOSIS — I130 Hypertensive heart and chronic kidney disease with heart failure and stage 1 through stage 4 chronic kidney disease, or unspecified chronic kidney disease: Secondary | ICD-10-CM | POA: Diagnosis not present

## 2016-04-11 ENCOUNTER — Telehealth: Payer: Self-pay | Admitting: Student

## 2016-04-11 NOTE — Telephone Encounter (Signed)
From 02/22/16 OV note:  1. Preoperative Cardiac Clearance for D&C with Hysteroscopy - has experienced episodes of vaginal bleeding with associated anemia. Is being followed by OB-GYN and the plan is [facility] with [facility] as there is concern she may have endometrial cancer.  - she denies any recent episodes of chest discomfort or dyspnea with exertion. Last ischemic evaluation was a low-risk NST in 12/2014 and EF is preserved by echo in 12/2014. - with the procedure itself being low-risk from a cardiac perspective and no recent anginal symptoms, no ischemic evaluation is indicated prior to her procedure. May proceed from a cardiac perspective.

## 2016-04-11 NOTE — Telephone Encounter (Signed)
Faxed OV notes to requesting provider.

## 2016-04-11 NOTE — Telephone Encounter (Signed)
New message     Request for surgical clearance:  1. What type of surgery is being performed? Hysteroscopy post meno bleeding   2. When is this surgery scheduled? March 29th 2018  3. Are there any medications that need to be held prior to surgery and how long?  Not sure   4. Name of physician performing surgery?  Dr Hardie Lora   5. What is your office phone and fax number? Fax [phone]  Attn Hoyle Sauer

## 2016-04-13 DIAGNOSIS — I5032 Chronic diastolic (congestive) heart failure: Secondary | ICD-10-CM | POA: Diagnosis not present

## 2016-04-13 DIAGNOSIS — I69354 Hemiplegia and hemiparesis following cerebral infarction affecting left non-dominant side: Secondary | ICD-10-CM | POA: Diagnosis not present

## 2016-04-13 DIAGNOSIS — I69398 Other sequelae of cerebral infarction: Secondary | ICD-10-CM | POA: Diagnosis not present

## 2016-04-13 DIAGNOSIS — E1151 Type 2 diabetes mellitus with diabetic peripheral angiopathy without gangrene: Secondary | ICD-10-CM | POA: Diagnosis not present

## 2016-04-13 DIAGNOSIS — M6281 Muscle weakness (generalized): Secondary | ICD-10-CM | POA: Diagnosis not present

## 2016-04-13 DIAGNOSIS — N184 Chronic kidney disease, stage 4 (severe): Secondary | ICD-10-CM | POA: Diagnosis not present

## 2016-04-13 DIAGNOSIS — I130 Hypertensive heart and chronic kidney disease with heart failure and stage 1 through stage 4 chronic kidney disease, or unspecified chronic kidney disease: Secondary | ICD-10-CM | POA: Diagnosis not present

## 2016-04-13 DIAGNOSIS — I2510 Atherosclerotic heart disease of native coronary artery without angina pectoris: Secondary | ICD-10-CM | POA: Diagnosis not present

## 2016-04-13 DIAGNOSIS — E1122 Type 2 diabetes mellitus with diabetic chronic kidney disease: Secondary | ICD-10-CM | POA: Diagnosis not present

## 2016-04-17 DIAGNOSIS — I2510 Atherosclerotic heart disease of native coronary artery without angina pectoris: Secondary | ICD-10-CM | POA: Diagnosis not present

## 2016-04-17 DIAGNOSIS — I5032 Chronic diastolic (congestive) heart failure: Secondary | ICD-10-CM | POA: Diagnosis not present

## 2016-04-17 DIAGNOSIS — E1122 Type 2 diabetes mellitus with diabetic chronic kidney disease: Secondary | ICD-10-CM | POA: Diagnosis not present

## 2016-04-17 DIAGNOSIS — E1151 Type 2 diabetes mellitus with diabetic peripheral angiopathy without gangrene: Secondary | ICD-10-CM | POA: Diagnosis not present

## 2016-04-17 DIAGNOSIS — I130 Hypertensive heart and chronic kidney disease with heart failure and stage 1 through stage 4 chronic kidney disease, or unspecified chronic kidney disease: Secondary | ICD-10-CM | POA: Diagnosis not present

## 2016-04-17 DIAGNOSIS — M6281 Muscle weakness (generalized): Secondary | ICD-10-CM | POA: Diagnosis not present

## 2016-04-17 DIAGNOSIS — I69398 Other sequelae of cerebral infarction: Secondary | ICD-10-CM | POA: Diagnosis not present

## 2016-04-17 DIAGNOSIS — I69354 Hemiplegia and hemiparesis following cerebral infarction affecting left non-dominant side: Secondary | ICD-10-CM | POA: Diagnosis not present

## 2016-04-17 DIAGNOSIS — N184 Chronic kidney disease, stage 4 (severe): Secondary | ICD-10-CM | POA: Diagnosis not present

## 2016-04-19 DIAGNOSIS — I69354 Hemiplegia and hemiparesis following cerebral infarction affecting left non-dominant side: Secondary | ICD-10-CM | POA: Diagnosis not present

## 2016-04-19 DIAGNOSIS — M6281 Muscle weakness (generalized): Secondary | ICD-10-CM | POA: Diagnosis not present

## 2016-04-19 DIAGNOSIS — I69398 Other sequelae of cerebral infarction: Secondary | ICD-10-CM | POA: Diagnosis not present

## 2016-04-19 DIAGNOSIS — N184 Chronic kidney disease, stage 4 (severe): Secondary | ICD-10-CM | POA: Diagnosis not present

## 2016-04-19 DIAGNOSIS — I130 Hypertensive heart and chronic kidney disease with heart failure and stage 1 through stage 4 chronic kidney disease, or unspecified chronic kidney disease: Secondary | ICD-10-CM | POA: Diagnosis not present

## 2016-04-19 DIAGNOSIS — I2510 Atherosclerotic heart disease of native coronary artery without angina pectoris: Secondary | ICD-10-CM | POA: Diagnosis not present

## 2016-04-19 DIAGNOSIS — E1151 Type 2 diabetes mellitus with diabetic peripheral angiopathy without gangrene: Secondary | ICD-10-CM | POA: Diagnosis not present

## 2016-04-19 DIAGNOSIS — I5032 Chronic diastolic (congestive) heart failure: Secondary | ICD-10-CM | POA: Diagnosis not present

## 2016-04-19 DIAGNOSIS — E1122 Type 2 diabetes mellitus with diabetic chronic kidney disease: Secondary | ICD-10-CM | POA: Diagnosis not present

## 2016-04-21 DIAGNOSIS — E1129 Type 2 diabetes mellitus with other diabetic kidney complication: Secondary | ICD-10-CM | POA: Diagnosis not present

## 2016-04-21 DIAGNOSIS — I10 Essential (primary) hypertension: Secondary | ICD-10-CM | POA: Diagnosis not present

## 2016-04-21 DIAGNOSIS — N183 Chronic kidney disease, stage 3 (moderate): Secondary | ICD-10-CM | POA: Diagnosis not present

## 2016-04-21 DIAGNOSIS — R600 Localized edema: Secondary | ICD-10-CM | POA: Diagnosis not present

## 2016-04-24 DIAGNOSIS — N184 Chronic kidney disease, stage 4 (severe): Secondary | ICD-10-CM | POA: Diagnosis not present

## 2016-04-24 DIAGNOSIS — I2510 Atherosclerotic heart disease of native coronary artery without angina pectoris: Secondary | ICD-10-CM | POA: Diagnosis not present

## 2016-04-24 DIAGNOSIS — M6281 Muscle weakness (generalized): Secondary | ICD-10-CM | POA: Diagnosis not present

## 2016-04-24 DIAGNOSIS — I69354 Hemiplegia and hemiparesis following cerebral infarction affecting left non-dominant side: Secondary | ICD-10-CM | POA: Diagnosis not present

## 2016-04-24 DIAGNOSIS — I5032 Chronic diastolic (congestive) heart failure: Secondary | ICD-10-CM | POA: Diagnosis not present

## 2016-04-24 DIAGNOSIS — I130 Hypertensive heart and chronic kidney disease with heart failure and stage 1 through stage 4 chronic kidney disease, or unspecified chronic kidney disease: Secondary | ICD-10-CM | POA: Diagnosis not present

## 2016-04-24 DIAGNOSIS — E1151 Type 2 diabetes mellitus with diabetic peripheral angiopathy without gangrene: Secondary | ICD-10-CM | POA: Diagnosis not present

## 2016-04-24 DIAGNOSIS — I69398 Other sequelae of cerebral infarction: Secondary | ICD-10-CM | POA: Diagnosis not present

## 2016-04-24 DIAGNOSIS — E1122 Type 2 diabetes mellitus with diabetic chronic kidney disease: Secondary | ICD-10-CM | POA: Diagnosis not present

## 2016-04-25 DIAGNOSIS — Z8673 Personal history of transient ischemic attack (TIA), and cerebral infarction without residual deficits: Secondary | ICD-10-CM | POA: Diagnosis not present

## 2016-04-25 DIAGNOSIS — I509 Heart failure, unspecified: Secondary | ICD-10-CM | POA: Diagnosis not present

## 2016-04-25 DIAGNOSIS — K219 Gastro-esophageal reflux disease without esophagitis: Secondary | ICD-10-CM | POA: Diagnosis not present

## 2016-04-25 DIAGNOSIS — N183 Chronic kidney disease, stage 3 (moderate): Secondary | ICD-10-CM | POA: Diagnosis not present

## 2016-04-25 DIAGNOSIS — I10 Essential (primary) hypertension: Secondary | ICD-10-CM | POA: Diagnosis not present

## 2016-04-25 DIAGNOSIS — G4733 Obstructive sleep apnea (adult) (pediatric): Secondary | ICD-10-CM | POA: Diagnosis not present

## 2016-04-25 DIAGNOSIS — E1122 Type 2 diabetes mellitus with diabetic chronic kidney disease: Secondary | ICD-10-CM | POA: Diagnosis not present

## 2016-04-25 DIAGNOSIS — Z01818 Encounter for other preprocedural examination: Secondary | ICD-10-CM | POA: Diagnosis not present

## 2016-04-25 DIAGNOSIS — Z794 Long term (current) use of insulin: Secondary | ICD-10-CM | POA: Diagnosis not present

## 2016-04-25 DIAGNOSIS — I739 Peripheral vascular disease, unspecified: Secondary | ICD-10-CM | POA: Diagnosis not present

## 2016-04-25 DIAGNOSIS — I2510 Atherosclerotic heart disease of native coronary artery without angina pectoris: Secondary | ICD-10-CM | POA: Diagnosis not present

## 2016-04-25 DIAGNOSIS — I6930 Unspecified sequelae of cerebral infarction: Secondary | ICD-10-CM | POA: Diagnosis not present

## 2016-04-27 ENCOUNTER — Other Ambulatory Visit: Payer: Self-pay | Admitting: *Deleted

## 2016-04-27 DIAGNOSIS — I69354 Hemiplegia and hemiparesis following cerebral infarction affecting left non-dominant side: Secondary | ICD-10-CM | POA: Diagnosis not present

## 2016-04-27 DIAGNOSIS — I5032 Chronic diastolic (congestive) heart failure: Secondary | ICD-10-CM | POA: Diagnosis not present

## 2016-04-27 DIAGNOSIS — M6281 Muscle weakness (generalized): Secondary | ICD-10-CM | POA: Diagnosis not present

## 2016-04-27 DIAGNOSIS — I2510 Atherosclerotic heart disease of native coronary artery without angina pectoris: Secondary | ICD-10-CM | POA: Diagnosis not present

## 2016-04-27 DIAGNOSIS — E1122 Type 2 diabetes mellitus with diabetic chronic kidney disease: Secondary | ICD-10-CM | POA: Diagnosis not present

## 2016-04-27 DIAGNOSIS — N184 Chronic kidney disease, stage 4 (severe): Secondary | ICD-10-CM | POA: Diagnosis not present

## 2016-04-27 DIAGNOSIS — I69398 Other sequelae of cerebral infarction: Secondary | ICD-10-CM | POA: Diagnosis not present

## 2016-04-27 DIAGNOSIS — E1151 Type 2 diabetes mellitus with diabetic peripheral angiopathy without gangrene: Secondary | ICD-10-CM | POA: Diagnosis not present

## 2016-04-27 DIAGNOSIS — I130 Hypertensive heart and chronic kidney disease with heart failure and stage 1 through stage 4 chronic kidney disease, or unspecified chronic kidney disease: Secondary | ICD-10-CM | POA: Diagnosis not present

## 2016-04-27 NOTE — Patient Outreach (Signed)
[name]   04/27/2016  [name] [name] 1931/03/10 [id number]  [name] [name] is an 83 y.o. female  Subjective:  Patient reports she is making it slow but making it. Patient discussed she is having a procedure related to vaginal bleeding on next week. Patient reports she still has some light bleeding flow at times .  Patient discussed it is taking the wound at her foot a little time to heal.   Objective:  BP 134/80 (BP Location: Right Arm, Patient Position: Sitting, Cuff Size: Large)   Pulse 64   Resp 18   Wt 203 lb 8 oz (92.3 kg)   SpO2 95%   BMI 34.93 kg/m   Using rolling walker to get into and out of bathroom then to her wheelchair that she sits in all day.    Review of Systems  Constitutional: Negative.   HENT: Negative.   Eyes: Negative.   Respiratory: Negative.   Cardiovascular: Positive for leg swelling.  Gastrointestinal: Negative.   Genitourinary: Negative.   Skin: Negative.   Neurological: Negative.   Endo/Heme/Allergies: Negative.   Psychiatric/Behavioral: Negative.     Physical Exam  Constitutional: She is oriented to person, place, and time. She appears well-developed and well-nourished.  Cardiovascular: Normal rate and normal heart sounds.   Respiratory: Effort normal.  GI: Soft.  Neurological: She is alert and oriented to person, place, and time.  Skin: Skin is warm and dry.     Psychiatric: She has a normal mood and affect. Her behavior is normal. Judgment and thought content normal.    Encounter Medications:   Outpatient Encounter Prescriptions as of 04/27/2016  Medication Sig Note  . ACCU-CHEK SOFTCLIX LANCETS lancets CHECK BLOOD SUGARS TWICE DAILY   . aspirin 81 MG tablet Take 81 mg by mouth daily.   . carvedilol (COREG) 6.25 MG tablet Take 6.25 mg by mouth 2 (two) times daily with a meal.   . furosemide (LASIX) 40 MG tablet TAKE 2 TABLETS BY MOUTH EVERY MORNING FOR FLUID 11/30/2015: 2 tablets  in the morning and 2 tablets mid day   . insulin aspart (NOVOLOG FLEXPEN) 100 UNIT/ML FlexPen Inject into the skin 3 (three) times daily with meals. Sliding Scale 2 units-6 units patient has list for 100-400 BS readings  100-200, 2 units, 201-250, 4 units, 251--300 6 units, 301-350, 8 units, 351 to 400 10 units.   . Insulin NPH Isophane & Regular (NOVOLIN 70/30 [place]) Inject 40-50 Units into the skin. 50 units every morning  40 units every evening   . Insulin Pen Needle (RELION SHORT PEN NEEDLES) 31G X 8 MM MISC 1 each by Does not apply route 2 (two) times daily.   [name] lisinopril (PRINIVIL,ZESTRIL) 40 MG tablet TAKE 1 TABLET DAILY   . polyethylene glycol (MIRALAX / GLYCOLAX) packet USE 1-2 PACKETS DAILY   . pravastatin (PRAVACHOL) 40 MG tablet TAKE 1 TABLET BY MOUTH AT BEDTIME   . ranitidine (ZANTAC) 150 MG tablet Take 150 mg by mouth 2 (two) times daily.    No facility-administered encounter medications on file as of 04/27/2016.     Functional Status:   In your present state of health, do you have any difficulty performing the following activities: 02/17/2016 12/28/2015  Hearing? N N  Vision? Y Y  Difficulty concentrating or making decisions? Y N  Walking or climbing stairs? Y Y  Dressing or bathing? Y Y  Doing errands, shopping? [name]  Preparing Food and eating ? [name]  Using the Toilet? Y Y  In the past six months, have you accidently leaked urine? Y N  Do you have problems with loss of bowel control? Y N  Managing your Medications? Y Y  Managing your Finances? [name]  Housekeeping or managing your Housekeeping? [name]  Some recent data might be hidden    Fall/Depression Screening:    PHQ 2/9 Scores 04/05/2016 12/28/2015 11/22/2015 11/18/2015 02/23/2015 11/03/2014 07/28/2014  PHQ - 2 Score 1 0 1 1 0 0 0     Fall Risk  02/17/2016 12/28/2015 11/22/2015 02/23/2015 11/03/2014  Falls in the past year? Yes - Yes No No  Number falls in past yr: 1 - 1 - -  Injury with Fall? No - No - -  Risk for fall  due to : Impaired balance/gait;Impaired mobility;History of fall(s) History of fall(s) History of fall(s) - -  Follow up Falls prevention discussed - Falls prevention discussed - -   Assessment:    Diabetes - 30 day average  251 patient checking blood sugar 1 to 2 times a day. Patient using sliding scale only if blood sugar over 250 but states she still only takes about 2 units. Patient has fear hypoglycemia especially if blood more elevated in the evening. Continue education on diabetes management Patient denies having any hypoglycemic episodes.   Right 5th toe area wound-  Slow healing, continued wound care by [facility]. Needs reinforcement with keeping foot elevated at times during the day. Home health RN at visit today and changes dressing twice weekly, today she made changes to wound care, and if no improvement in next week, will notify MD again for further recommendations.  Heart failure- continuing daily weights,and decreasing in swelling in legs.    Vaginal bleeding -  For outpatient  procedure on next week, hysteroscopy. Has pre procedure check list , will reinforce instructions.   High fall risk - using walker and wheelchair consistently, doing exercises on some days . No recent falls     Plan:  Will follow up in 2 weeks by telephone.  Will review pre procedure information that patient has received from surgeon, related to preop routine, medications, antibacterial soap prescribed.   Reinforced with patient to elevate her legs at least 3 times a day for 30 minutes, reviewed benefits.  Will send PCP this note.    [name], RN, [place] Management [phone]- Mobile [phone]- Toll Free Main Office

## 2016-05-01 DIAGNOSIS — E1122 Type 2 diabetes mellitus with diabetic chronic kidney disease: Secondary | ICD-10-CM | POA: Diagnosis not present

## 2016-05-01 DIAGNOSIS — I2510 Atherosclerotic heart disease of native coronary artery without angina pectoris: Secondary | ICD-10-CM | POA: Diagnosis not present

## 2016-05-01 DIAGNOSIS — I130 Hypertensive heart and chronic kidney disease with heart failure and stage 1 through stage 4 chronic kidney disease, or unspecified chronic kidney disease: Secondary | ICD-10-CM | POA: Diagnosis not present

## 2016-05-01 DIAGNOSIS — I69354 Hemiplegia and hemiparesis following cerebral infarction affecting left non-dominant side: Secondary | ICD-10-CM | POA: Diagnosis not present

## 2016-05-01 DIAGNOSIS — I69398 Other sequelae of cerebral infarction: Secondary | ICD-10-CM | POA: Diagnosis not present

## 2016-05-01 DIAGNOSIS — E1151 Type 2 diabetes mellitus with diabetic peripheral angiopathy without gangrene: Secondary | ICD-10-CM | POA: Diagnosis not present

## 2016-05-01 DIAGNOSIS — I5032 Chronic diastolic (congestive) heart failure: Secondary | ICD-10-CM | POA: Diagnosis not present

## 2016-05-01 DIAGNOSIS — N184 Chronic kidney disease, stage 4 (severe): Secondary | ICD-10-CM | POA: Diagnosis not present

## 2016-05-01 DIAGNOSIS — M6281 Muscle weakness (generalized): Secondary | ICD-10-CM | POA: Diagnosis not present

## 2016-05-04 DIAGNOSIS — N183 Chronic kidney disease, stage 3 (moderate): Secondary | ICD-10-CM | POA: Diagnosis not present

## 2016-05-04 DIAGNOSIS — E11319 Type 2 diabetes mellitus with unspecified diabetic retinopathy without macular edema: Secondary | ICD-10-CM | POA: Diagnosis not present

## 2016-05-04 DIAGNOSIS — C541 Malignant neoplasm of endometrium: Secondary | ICD-10-CM | POA: Diagnosis not present

## 2016-05-04 DIAGNOSIS — R2689 Other abnormalities of gait and mobility: Secondary | ICD-10-CM | POA: Diagnosis not present

## 2016-05-04 DIAGNOSIS — N840 Polyp of corpus uteri: Secondary | ICD-10-CM | POA: Diagnosis not present

## 2016-05-04 DIAGNOSIS — I6350 Cerebral infarction due to unspecified occlusion or stenosis of unspecified cerebral artery: Secondary | ICD-10-CM | POA: Diagnosis not present

## 2016-05-04 DIAGNOSIS — I5030 Unspecified diastolic (congestive) heart failure: Secondary | ICD-10-CM | POA: Diagnosis not present

## 2016-05-04 DIAGNOSIS — C55 Malignant neoplasm of uterus, part unspecified: Secondary | ICD-10-CM | POA: Diagnosis not present

## 2016-05-04 DIAGNOSIS — N898 Other specified noninflammatory disorders of vagina: Secondary | ICD-10-CM | POA: Diagnosis not present

## 2016-05-04 DIAGNOSIS — N842 Polyp of vagina: Secondary | ICD-10-CM | POA: Diagnosis not present

## 2016-05-04 DIAGNOSIS — I130 Hypertensive heart and chronic kidney disease with heart failure and stage 1 through stage 4 chronic kidney disease, or unspecified chronic kidney disease: Secondary | ICD-10-CM | POA: Diagnosis not present

## 2016-05-04 DIAGNOSIS — N8111 Cystocele, midline: Secondary | ICD-10-CM | POA: Diagnosis not present

## 2016-05-04 DIAGNOSIS — E1122 Type 2 diabetes mellitus with diabetic chronic kidney disease: Secondary | ICD-10-CM | POA: Diagnosis not present

## 2016-05-04 DIAGNOSIS — N950 Postmenopausal bleeding: Secondary | ICD-10-CM | POA: Diagnosis not present

## 2016-05-04 DIAGNOSIS — G8190 Hemiplegia, unspecified affecting unspecified side: Secondary | ICD-10-CM | POA: Diagnosis not present

## 2016-05-04 DIAGNOSIS — I509 Heart failure, unspecified: Secondary | ICD-10-CM | POA: Diagnosis not present

## 2016-05-04 DIAGNOSIS — Z794 Long term (current) use of insulin: Secondary | ICD-10-CM | POA: Diagnosis not present

## 2016-05-04 DIAGNOSIS — I129 Hypertensive chronic kidney disease with stage 1 through stage 4 chronic kidney disease, or unspecified chronic kidney disease: Secondary | ICD-10-CM | POA: Diagnosis not present

## 2016-05-04 DIAGNOSIS — E119 Type 2 diabetes mellitus without complications: Secondary | ICD-10-CM | POA: Diagnosis not present

## 2016-05-05 DIAGNOSIS — I130 Hypertensive heart and chronic kidney disease with heart failure and stage 1 through stage 4 chronic kidney disease, or unspecified chronic kidney disease: Secondary | ICD-10-CM | POA: Diagnosis not present

## 2016-05-05 DIAGNOSIS — N840 Polyp of corpus uteri: Secondary | ICD-10-CM | POA: Diagnosis not present

## 2016-05-05 DIAGNOSIS — N898 Other specified noninflammatory disorders of vagina: Secondary | ICD-10-CM | POA: Diagnosis not present

## 2016-05-05 DIAGNOSIS — C541 Malignant neoplasm of endometrium: Secondary | ICD-10-CM | POA: Diagnosis not present

## 2016-05-05 DIAGNOSIS — E1122 Type 2 diabetes mellitus with diabetic chronic kidney disease: Secondary | ICD-10-CM | POA: Diagnosis not present

## 2016-05-05 DIAGNOSIS — R531 Weakness: Secondary | ICD-10-CM | POA: Diagnosis not present

## 2016-05-05 DIAGNOSIS — R2689 Other abnormalities of gait and mobility: Secondary | ICD-10-CM | POA: Diagnosis not present

## 2016-05-05 DIAGNOSIS — N950 Postmenopausal bleeding: Secondary | ICD-10-CM | POA: Diagnosis not present

## 2016-05-05 DIAGNOSIS — I10 Essential (primary) hypertension: Secondary | ICD-10-CM | POA: Diagnosis not present

## 2016-05-05 DIAGNOSIS — I5030 Unspecified diastolic (congestive) heart failure: Secondary | ICD-10-CM | POA: Diagnosis not present

## 2016-05-05 DIAGNOSIS — I6350 Cerebral infarction due to unspecified occlusion or stenosis of unspecified cerebral artery: Secondary | ICD-10-CM | POA: Diagnosis not present

## 2016-05-05 DIAGNOSIS — G8190 Hemiplegia, unspecified affecting unspecified side: Secondary | ICD-10-CM | POA: Diagnosis not present

## 2016-05-05 DIAGNOSIS — I69398 Other sequelae of cerebral infarction: Secondary | ICD-10-CM | POA: Diagnosis not present

## 2016-05-06 DIAGNOSIS — N898 Other specified noninflammatory disorders of vagina: Secondary | ICD-10-CM | POA: Diagnosis not present

## 2016-05-06 DIAGNOSIS — Z794 Long term (current) use of insulin: Secondary | ICD-10-CM | POA: Diagnosis not present

## 2016-05-06 DIAGNOSIS — C541 Malignant neoplasm of endometrium: Secondary | ICD-10-CM | POA: Diagnosis not present

## 2016-05-06 DIAGNOSIS — I6350 Cerebral infarction due to unspecified occlusion or stenosis of unspecified cerebral artery: Secondary | ICD-10-CM | POA: Diagnosis not present

## 2016-05-06 DIAGNOSIS — I5030 Unspecified diastolic (congestive) heart failure: Secondary | ICD-10-CM | POA: Diagnosis not present

## 2016-05-06 DIAGNOSIS — R2689 Other abnormalities of gait and mobility: Secondary | ICD-10-CM | POA: Diagnosis not present

## 2016-05-06 DIAGNOSIS — E11319 Type 2 diabetes mellitus with unspecified diabetic retinopathy without macular edema: Secondary | ICD-10-CM | POA: Diagnosis not present

## 2016-05-06 DIAGNOSIS — I10 Essential (primary) hypertension: Secondary | ICD-10-CM | POA: Diagnosis not present

## 2016-05-06 DIAGNOSIS — I130 Hypertensive heart and chronic kidney disease with heart failure and stage 1 through stage 4 chronic kidney disease, or unspecified chronic kidney disease: Secondary | ICD-10-CM | POA: Diagnosis not present

## 2016-05-06 DIAGNOSIS — I69398 Other sequelae of cerebral infarction: Secondary | ICD-10-CM | POA: Diagnosis not present

## 2016-05-06 DIAGNOSIS — G4733 Obstructive sleep apnea (adult) (pediatric): Secondary | ICD-10-CM | POA: Diagnosis not present

## 2016-05-06 DIAGNOSIS — N950 Postmenopausal bleeding: Secondary | ICD-10-CM | POA: Diagnosis not present

## 2016-05-06 DIAGNOSIS — R531 Weakness: Secondary | ICD-10-CM | POA: Diagnosis not present

## 2016-05-06 DIAGNOSIS — G8190 Hemiplegia, unspecified affecting unspecified side: Secondary | ICD-10-CM | POA: Diagnosis not present

## 2016-05-06 DIAGNOSIS — N840 Polyp of corpus uteri: Secondary | ICD-10-CM | POA: Diagnosis not present

## 2016-05-06 DIAGNOSIS — E1122 Type 2 diabetes mellitus with diabetic chronic kidney disease: Secondary | ICD-10-CM | POA: Diagnosis not present

## 2016-05-07 DIAGNOSIS — G8190 Hemiplegia, unspecified affecting unspecified side: Secondary | ICD-10-CM | POA: Diagnosis not present

## 2016-05-07 DIAGNOSIS — E1122 Type 2 diabetes mellitus with diabetic chronic kidney disease: Secondary | ICD-10-CM | POA: Diagnosis not present

## 2016-05-07 DIAGNOSIS — N898 Other specified noninflammatory disorders of vagina: Secondary | ICD-10-CM | POA: Diagnosis not present

## 2016-05-07 DIAGNOSIS — N950 Postmenopausal bleeding: Secondary | ICD-10-CM | POA: Diagnosis not present

## 2016-05-07 DIAGNOSIS — N840 Polyp of corpus uteri: Secondary | ICD-10-CM | POA: Diagnosis not present

## 2016-05-07 DIAGNOSIS — I6350 Cerebral infarction due to unspecified occlusion or stenosis of unspecified cerebral artery: Secondary | ICD-10-CM | POA: Diagnosis not present

## 2016-05-07 DIAGNOSIS — C541 Malignant neoplasm of endometrium: Secondary | ICD-10-CM | POA: Diagnosis not present

## 2016-05-07 DIAGNOSIS — R2689 Other abnormalities of gait and mobility: Secondary | ICD-10-CM | POA: Diagnosis not present

## 2016-05-07 DIAGNOSIS — I130 Hypertensive heart and chronic kidney disease with heart failure and stage 1 through stage 4 chronic kidney disease, or unspecified chronic kidney disease: Secondary | ICD-10-CM | POA: Diagnosis not present

## 2016-05-07 DIAGNOSIS — R531 Weakness: Secondary | ICD-10-CM | POA: Diagnosis not present

## 2016-05-07 DIAGNOSIS — I5030 Unspecified diastolic (congestive) heart failure: Secondary | ICD-10-CM | POA: Diagnosis not present

## 2016-05-08 DIAGNOSIS — I6350 Cerebral infarction due to unspecified occlusion or stenosis of unspecified cerebral artery: Secondary | ICD-10-CM | POA: Diagnosis not present

## 2016-05-08 DIAGNOSIS — G8190 Hemiplegia, unspecified affecting unspecified side: Secondary | ICD-10-CM | POA: Diagnosis not present

## 2016-05-08 DIAGNOSIS — N840 Polyp of corpus uteri: Secondary | ICD-10-CM | POA: Diagnosis not present

## 2016-05-08 DIAGNOSIS — R2689 Other abnormalities of gait and mobility: Secondary | ICD-10-CM | POA: Diagnosis not present

## 2016-05-08 DIAGNOSIS — I130 Hypertensive heart and chronic kidney disease with heart failure and stage 1 through stage 4 chronic kidney disease, or unspecified chronic kidney disease: Secondary | ICD-10-CM | POA: Diagnosis not present

## 2016-05-08 DIAGNOSIS — I69398 Other sequelae of cerebral infarction: Secondary | ICD-10-CM | POA: Diagnosis not present

## 2016-05-08 DIAGNOSIS — E1122 Type 2 diabetes mellitus with diabetic chronic kidney disease: Secondary | ICD-10-CM | POA: Diagnosis not present

## 2016-05-08 DIAGNOSIS — C541 Malignant neoplasm of endometrium: Secondary | ICD-10-CM | POA: Diagnosis not present

## 2016-05-08 DIAGNOSIS — N898 Other specified noninflammatory disorders of vagina: Secondary | ICD-10-CM | POA: Diagnosis not present

## 2016-05-08 DIAGNOSIS — I5030 Unspecified diastolic (congestive) heart failure: Secondary | ICD-10-CM | POA: Diagnosis not present

## 2016-05-08 DIAGNOSIS — R531 Weakness: Secondary | ICD-10-CM | POA: Diagnosis not present

## 2016-05-09 DIAGNOSIS — I69398 Other sequelae of cerebral infarction: Secondary | ICD-10-CM | POA: Diagnosis not present

## 2016-05-09 DIAGNOSIS — N840 Polyp of corpus uteri: Secondary | ICD-10-CM | POA: Diagnosis not present

## 2016-05-09 DIAGNOSIS — R2689 Other abnormalities of gait and mobility: Secondary | ICD-10-CM | POA: Diagnosis not present

## 2016-05-09 DIAGNOSIS — G8190 Hemiplegia, unspecified affecting unspecified side: Secondary | ICD-10-CM | POA: Diagnosis not present

## 2016-05-09 DIAGNOSIS — I130 Hypertensive heart and chronic kidney disease with heart failure and stage 1 through stage 4 chronic kidney disease, or unspecified chronic kidney disease: Secondary | ICD-10-CM | POA: Diagnosis not present

## 2016-05-09 DIAGNOSIS — E1122 Type 2 diabetes mellitus with diabetic chronic kidney disease: Secondary | ICD-10-CM | POA: Diagnosis not present

## 2016-05-09 DIAGNOSIS — I5030 Unspecified diastolic (congestive) heart failure: Secondary | ICD-10-CM | POA: Diagnosis not present

## 2016-05-09 DIAGNOSIS — C541 Malignant neoplasm of endometrium: Secondary | ICD-10-CM | POA: Diagnosis not present

## 2016-05-09 DIAGNOSIS — N898 Other specified noninflammatory disorders of vagina: Secondary | ICD-10-CM | POA: Diagnosis not present

## 2016-05-09 DIAGNOSIS — R531 Weakness: Secondary | ICD-10-CM | POA: Diagnosis not present

## 2016-05-09 DIAGNOSIS — I6350 Cerebral infarction due to unspecified occlusion or stenosis of unspecified cerebral artery: Secondary | ICD-10-CM | POA: Diagnosis not present

## 2016-05-10 DIAGNOSIS — R531 Weakness: Secondary | ICD-10-CM | POA: Diagnosis not present

## 2016-05-10 DIAGNOSIS — C541 Malignant neoplasm of endometrium: Secondary | ICD-10-CM | POA: Diagnosis not present

## 2016-05-10 DIAGNOSIS — G8190 Hemiplegia, unspecified affecting unspecified side: Secondary | ICD-10-CM | POA: Diagnosis not present

## 2016-05-10 DIAGNOSIS — I130 Hypertensive heart and chronic kidney disease with heart failure and stage 1 through stage 4 chronic kidney disease, or unspecified chronic kidney disease: Secondary | ICD-10-CM | POA: Diagnosis not present

## 2016-05-10 DIAGNOSIS — E1122 Type 2 diabetes mellitus with diabetic chronic kidney disease: Secondary | ICD-10-CM | POA: Diagnosis not present

## 2016-05-10 DIAGNOSIS — N898 Other specified noninflammatory disorders of vagina: Secondary | ICD-10-CM | POA: Diagnosis not present

## 2016-05-10 DIAGNOSIS — I5030 Unspecified diastolic (congestive) heart failure: Secondary | ICD-10-CM | POA: Diagnosis not present

## 2016-05-10 DIAGNOSIS — N840 Polyp of corpus uteri: Secondary | ICD-10-CM | POA: Diagnosis not present

## 2016-05-10 DIAGNOSIS — I69398 Other sequelae of cerebral infarction: Secondary | ICD-10-CM | POA: Diagnosis not present

## 2016-05-10 DIAGNOSIS — R2689 Other abnormalities of gait and mobility: Secondary | ICD-10-CM | POA: Diagnosis not present

## 2016-05-10 DIAGNOSIS — I6350 Cerebral infarction due to unspecified occlusion or stenosis of unspecified cerebral artery: Secondary | ICD-10-CM | POA: Diagnosis not present

## 2016-05-11 ENCOUNTER — Other Ambulatory Visit: Payer: Self-pay | Admitting: *Deleted

## 2016-05-11 DIAGNOSIS — I509 Heart failure, unspecified: Secondary | ICD-10-CM | POA: Diagnosis not present

## 2016-05-11 DIAGNOSIS — I130 Hypertensive heart and chronic kidney disease with heart failure and stage 1 through stage 4 chronic kidney disease, or unspecified chronic kidney disease: Secondary | ICD-10-CM | POA: Diagnosis not present

## 2016-05-11 DIAGNOSIS — R2689 Other abnormalities of gait and mobility: Secondary | ICD-10-CM | POA: Diagnosis not present

## 2016-05-11 DIAGNOSIS — G8190 Hemiplegia, unspecified affecting unspecified side: Secondary | ICD-10-CM | POA: Diagnosis not present

## 2016-05-11 DIAGNOSIS — N840 Polyp of corpus uteri: Secondary | ICD-10-CM | POA: Diagnosis not present

## 2016-05-11 DIAGNOSIS — E119 Type 2 diabetes mellitus without complications: Secondary | ICD-10-CM | POA: Diagnosis not present

## 2016-05-11 DIAGNOSIS — I129 Hypertensive chronic kidney disease with stage 1 through stage 4 chronic kidney disease, or unspecified chronic kidney disease: Secondary | ICD-10-CM | POA: Diagnosis not present

## 2016-05-11 DIAGNOSIS — I5030 Unspecified diastolic (congestive) heart failure: Secondary | ICD-10-CM | POA: Diagnosis not present

## 2016-05-11 DIAGNOSIS — N183 Chronic kidney disease, stage 3 (moderate): Secondary | ICD-10-CM | POA: Diagnosis not present

## 2016-05-11 DIAGNOSIS — N898 Other specified noninflammatory disorders of vagina: Secondary | ICD-10-CM | POA: Diagnosis not present

## 2016-05-11 DIAGNOSIS — I6350 Cerebral infarction due to unspecified occlusion or stenosis of unspecified cerebral artery: Secondary | ICD-10-CM | POA: Diagnosis not present

## 2016-05-11 DIAGNOSIS — C541 Malignant neoplasm of endometrium: Secondary | ICD-10-CM | POA: Diagnosis not present

## 2016-05-11 DIAGNOSIS — E1122 Type 2 diabetes mellitus with diabetic chronic kidney disease: Secondary | ICD-10-CM | POA: Diagnosis not present

## 2016-05-11 NOTE — Patient Outreach (Addendum)
[facility]) Care Management  05/11/2016  [name] [name] [birth year] [id number]   Telephone assessment call  Placed call to patient to follow up after her recent procedure at [facility] . No answer able to leave a hipaa compliant message requesting a return call.   Plan Will await return call , if no response, Will plan return call in the next 2  weeks.[name], RN, [place] Management [phone]- Mobile [phone]- [place] Office

## 2016-05-12 DIAGNOSIS — R41841 Cognitive communication deficit: Secondary | ICD-10-CM | POA: Diagnosis not present

## 2016-05-12 DIAGNOSIS — N950 Postmenopausal bleeding: Secondary | ICD-10-CM | POA: Diagnosis not present

## 2016-05-12 DIAGNOSIS — E119 Type 2 diabetes mellitus without complications: Secondary | ICD-10-CM | POA: Diagnosis not present

## 2016-05-12 DIAGNOSIS — R279 Unspecified lack of coordination: Secondary | ICD-10-CM | POA: Diagnosis not present

## 2016-05-12 DIAGNOSIS — E1122 Type 2 diabetes mellitus with diabetic chronic kidney disease: Secondary | ICD-10-CM | POA: Diagnosis not present

## 2016-05-12 DIAGNOSIS — R2689 Other abnormalities of gait and mobility: Secondary | ICD-10-CM | POA: Diagnosis not present

## 2016-05-12 DIAGNOSIS — I130 Hypertensive heart and chronic kidney disease with heart failure and stage 1 through stage 4 chronic kidney disease, or unspecified chronic kidney disease: Secondary | ICD-10-CM | POA: Diagnosis not present

## 2016-05-12 DIAGNOSIS — I10 Essential (primary) hypertension: Secondary | ICD-10-CM | POA: Diagnosis not present

## 2016-05-12 DIAGNOSIS — N840 Polyp of corpus uteri: Secondary | ICD-10-CM | POA: Diagnosis not present

## 2016-05-12 DIAGNOSIS — E11319 Type 2 diabetes mellitus with unspecified diabetic retinopathy without macular edema: Secondary | ICD-10-CM | POA: Diagnosis not present

## 2016-05-12 DIAGNOSIS — I110 Hypertensive heart disease with heart failure: Secondary | ICD-10-CM | POA: Diagnosis not present

## 2016-05-12 DIAGNOSIS — I5023 Acute on chronic systolic (congestive) heart failure: Secondary | ICD-10-CM | POA: Diagnosis not present

## 2016-05-12 DIAGNOSIS — E1169 Type 2 diabetes mellitus with other specified complication: Secondary | ICD-10-CM | POA: Diagnosis not present

## 2016-05-12 DIAGNOSIS — N898 Other specified noninflammatory disorders of vagina: Secondary | ICD-10-CM | POA: Diagnosis not present

## 2016-05-12 DIAGNOSIS — I2510 Atherosclerotic heart disease of native coronary artery without angina pectoris: Secondary | ICD-10-CM | POA: Diagnosis not present

## 2016-05-12 DIAGNOSIS — R5381 Other malaise: Secondary | ICD-10-CM | POA: Diagnosis not present

## 2016-05-12 DIAGNOSIS — C541 Malignant neoplasm of endometrium: Secondary | ICD-10-CM | POA: Diagnosis not present

## 2016-05-12 DIAGNOSIS — I6350 Cerebral infarction due to unspecified occlusion or stenosis of unspecified cerebral artery: Secondary | ICD-10-CM | POA: Diagnosis not present

## 2016-05-12 DIAGNOSIS — G4733 Obstructive sleep apnea (adult) (pediatric): Secondary | ICD-10-CM | POA: Diagnosis not present

## 2016-05-12 DIAGNOSIS — M6281 Muscle weakness (generalized): Secondary | ICD-10-CM | POA: Diagnosis not present

## 2016-05-12 DIAGNOSIS — I5030 Unspecified diastolic (congestive) heart failure: Secondary | ICD-10-CM | POA: Diagnosis not present

## 2016-05-12 DIAGNOSIS — G8190 Hemiplegia, unspecified affecting unspecified side: Secondary | ICD-10-CM | POA: Diagnosis not present

## 2016-05-12 DIAGNOSIS — I639 Cerebral infarction, unspecified: Secondary | ICD-10-CM | POA: Diagnosis not present

## 2016-05-12 DIAGNOSIS — I6990 Unspecified sequelae of unspecified cerebrovascular disease: Secondary | ICD-10-CM | POA: Diagnosis not present

## 2016-05-15 ENCOUNTER — Other Ambulatory Visit: Payer: Self-pay | Admitting: *Deleted

## 2016-05-15 NOTE — Patient Outreach (Signed)
[place] [facility]) Care Management  05/15/2016  [name] [name] 1931-06-08 [id number]   Placed call to patient contact number, no answer unable to leave a  Message.  Plan Will await return call, if no response will attempt call again in this week.  [name], RN, [place] Management [phone]- Mobile [phone]- Toll Free Main Office

## 2016-05-18 ENCOUNTER — Other Ambulatory Visit: Payer: Self-pay | Admitting: *Deleted

## 2016-05-18 NOTE — Patient Outreach (Signed)
[place] [facility]) Care Management  05/18/2016  [name] [name] [birth year] [id number]  Follow up telephone call   Telephone follow up call to Mrs.Martin home, husband Domingo Mend identified on the phone states patient was in rehab after recent surgery, he is unable to recall name of center.  Placed call to Delane Ginger, daughter listed on [facility] consent, hipaa information verified. Hildred Priest states patient is at [facility] care center in [place] and was discharged from [facility] on Friday 4/6 to facility for short term rehab 21 days.    Plan Will close nursing program for now  will place social worker consult for follow for discharge planning.    [name], RN, [place] Management [phone]- Mobile [phone]- Toll Free Main Office

## 2016-05-26 ENCOUNTER — Other Ambulatory Visit: Payer: Self-pay | Admitting: *Deleted

## 2016-05-26 NOTE — Patient Outreach (Signed)
[name]) Care Management  [facility] Social Work  05/26/2016  [name] [name] 1931/02/22 [id number]  Subjective:   "I'm going to see the doctor tomorrow Monday".  Objective: CSW to assist patient with commuity based resources to aide in her well-being, quality of life and overall safety/needs.    Current Medications:  Current Outpatient Prescriptions  Medication Sig Dispense Refill  . ACCU-CHEK SOFTCLIX LANCETS lancets CHECK BLOOD SUGARS TWICE DAILY 200 each 12  . aspirin 81 MG tablet Take 81 mg by mouth daily.    . carvedilol (COREG) 6.25 MG tablet Take 6.25 mg by mouth 2 (two) times daily with a meal.    . furosemide (LASIX) 40 MG tablet TAKE 2 TABLETS BY MOUTH EVERY MORNING FOR FLUID 60 tablet 6  . insulin aspart (NOVOLOG FLEXPEN) 100 UNIT/ML FlexPen Inject into the skin 3 (three) times daily with meals. Sliding Scale 2 units-6 units patient has list for 100-400 BS readings  100-200, 2 units, 201-250, 4 units, 251--300 6 units, 301-350, 8 units, 351 to 400 10 units.    . Insulin NPH Isophane & Regular (NOVOLIN 70/30 [place]) Inject 40-50 Units into the skin. 50 units every morning  40 units every evening    . Insulin Pen Needle (RELION SHORT PEN NEEDLES) 31G X 8 MM MISC 1 each by Does not apply route 2 (two) times daily. 50 each 11  . lisinopril (PRINIVIL,ZESTRIL) 40 MG tablet TAKE 1 TABLET DAILY 30 tablet 0  . medroxyPROGESTERone (PROVERA) 5 MG tablet Take 5 mg by mouth daily.    . polyethylene glycol (MIRALAX / GLYCOLAX) packet USE 1-2 PACKETS DAILY 60 packet 0  . pravastatin (PRAVACHOL) 40 MG tablet TAKE 1 TABLET BY MOUTH AT BEDTIME 30 tablet 3  . ranitidine (ZANTAC) 150 MG tablet Take 150 mg by mouth 2 (two) times daily.     No current facility-administered medications for this visit.     Functional Status:  In your present state of health, do you have any difficulty performing the following activities: 02/17/2016 12/28/2015  Hearing? N N  Vision? Y Y   Difficulty concentrating or making decisions? Y N  Walking or climbing stairs? Y Y  Dressing or bathing? Y Y  Doing errands, shopping? [name]  Preparing Food and eating ? Y Y  Using the Toilet? Y Y  In the past six months, have you accidently leaked urine? Y N  Do you have problems with loss of bowel control? Y N  Managing your Medications? Y Y  Managing your Finances? [name]  Housekeeping or managing your Housekeeping? Y Y  Some recent data might be hidden    Fall/Depression Screening:  Fall Risk  05/26/2016 02/17/2016 12/28/2015  Falls in the past year? No Yes -  Number falls in past yr: - 1 -  Injury with Fall? - No -  Risk for fall due to : Impaired vision;Impaired mobility;Impaired balance/gait Impaired balance/gait;Impaired mobility;History of fall(s) History of fall(s)  Follow up - Falls prevention discussed -   PHQ 2/9 Scores 05/26/2016 04/05/2016 12/28/2015 11/22/2015 11/18/2015 02/23/2015 11/03/2014  PHQ - 2 Score 1 1 0 1 1 0 0    Assessment:  CSW met with patient at [facility] SNF where she is receiving PT and OT therapies s/p hospitalization and GYN surgery.  "I was bleeding like a period and they found cancer". CSW talked with patient about her plans after rehab; she plans to return home with her husband. "They might let me go home  next week". CSW spoke with patient about her progress at SNF and her potential home needs. She reports she and her 67yo husband still get meals on wheels and she gets some in home assistance with ADL's from a church/agency.  She does not plan to pursue long term placement in a facility and at this time is hopeful to return home and be fairly independent.   CSW also spoke with the dc planner at SNF who states they plan to have a meeting next week to further discuss dc plans and also to apply for Medicaid.    Plan:  CSW will plan f/u call to patient and SNF next week for further assess and assist with dc plans/needs.    [facility] CM Care Plan Problem One      Most Recent Value  Care Plan Problem One  Patient admitted to SNF for rehab.  Role Documenting the Problem One  Clinical Social Worker  [facility] CM Short Term Goal #1 (0-30 days)  Patient will participate in PT and OT at SNF over the next 30 days.  [facility] CM Short Term Goal #1 Start Date  05/26/16  Interventions for Short Term Goal #1  CSW encourged patient to participate with therapies for dc home.      [name], MSW, [place] Worker  [place] [phone]

## 2016-05-29 ENCOUNTER — Other Ambulatory Visit: Payer: Self-pay | Admitting: *Deleted

## 2016-05-29 DIAGNOSIS — C541 Malignant neoplasm of endometrium: Secondary | ICD-10-CM | POA: Diagnosis not present

## 2016-05-29 NOTE — Patient Outreach (Signed)
[place] [facility]) Care Management  05/29/2016  [name] [name] 12-Nov-1931 [id number]   Patient remains in SNF and is going to see her Physician (surgeon) today for follow up.  CSW spoke with patient's husband and he anticipates her dc back to home Wednesday of this week.  CSW will plan f/u call later this week for update on SNF dc and to assess for needs further.  [facility] RNCM advised of probable dc home 05/31/16.    [name], MSW, [place] Worker  [place] [phone]

## 2016-06-01 ENCOUNTER — Other Ambulatory Visit: Payer: Self-pay | Admitting: *Deleted

## 2016-06-01 ENCOUNTER — Ambulatory Visit: Payer: Self-pay | Admitting: *Deleted

## 2016-06-01 NOTE — Patient Outreach (Signed)
[place] [facility]) Care Management  06/01/2016  [name] [name] 10/04/1931 [id number]  Placed call to patient for transition of care call, as previous conversation with [name] anticipated plan for patient to discharge from [facility] care and rehab on 4/25. Unsuccessful call, no answer unable to leave a message.  Plan Will follow up with social worker regarding discharge date,and follow transition of care protocol. '  [name], RN, [place] Management [phone]- Mobile [phone]- [place]

## 2016-06-02 ENCOUNTER — Other Ambulatory Visit: Payer: Self-pay | Admitting: *Deleted

## 2016-06-02 NOTE — Patient Outreach (Signed)
[place] [facility]) Care Management  06/02/2016  [name] [name] 01-Oct-1931 [id number]   CSW spoke with SNF rep who reports patient has "won her appeal" to stay in SNF rehab longer. CSW confirmed plans for her to dc home with [facility] once released and will f/u next week for SNF update.   [name], MSW, [place] Worker  [place] [phone]

## 2016-06-08 ENCOUNTER — Other Ambulatory Visit: Payer: Self-pay | Admitting: *Deleted

## 2016-06-08 NOTE — Patient Outreach (Signed)
[place] [facility]) Care Management  06/08/2016  [name] [name] 17-Sep-1931 [id number]   Patient remains at SNF rehab at [facility]. Per husband, she may be released soon.  CSW has left message for SNF dc planner to call with update. Will advise once update is received.   [name], MSW, [place] Worker  [place] [phone]

## 2016-06-13 ENCOUNTER — Other Ambulatory Visit: Payer: Self-pay | Admitting: *Deleted

## 2016-06-13 NOTE — Patient Outreach (Signed)
[place] [facility]) Care Management  06/13/2016  [name] [name] 1931-08-16 [id number]   CSW spoke with patient's husband and left message for return call for the SNF rep today.  CSW will plan a f/u call this Thursday as well.    [name], MSW, [place] Worker  [place] [phone]

## 2016-06-14 DIAGNOSIS — E1169 Type 2 diabetes mellitus with other specified complication: Secondary | ICD-10-CM | POA: Diagnosis not present

## 2016-06-14 DIAGNOSIS — E11319 Type 2 diabetes mellitus with unspecified diabetic retinopathy without macular edema: Secondary | ICD-10-CM | POA: Diagnosis not present

## 2016-06-14 DIAGNOSIS — I110 Hypertensive heart disease with heart failure: Secondary | ICD-10-CM | POA: Diagnosis not present

## 2016-06-14 DIAGNOSIS — I6990 Unspecified sequelae of unspecified cerebrovascular disease: Secondary | ICD-10-CM | POA: Diagnosis not present

## 2016-06-15 ENCOUNTER — Ambulatory Visit: Payer: Self-pay | Admitting: *Deleted

## 2016-06-19 ENCOUNTER — Other Ambulatory Visit: Payer: Self-pay | Admitting: *Deleted

## 2016-06-19 NOTE — Patient Outreach (Signed)
[place] [facility]) Care Management  06/19/2016  NAKESHA EBRAHIM 01-Oct-1931 [id number]  Incoming call from patient to inform me that she was discharged from [facility] on 5/11. Patient discussed that her daughter had filed an appeal for her to stay longer but they had not heard anything by Friday evening so she called her son to pick her up. Patient states if I could have stayed longer I would have.  Patient discussed she hadn't heard anything from anyone from home health yet, she needed help with making appointment with PCP and she was not sure about getting  instructions from the facility.   Patient discussed she is out of bed in wheelchair at present, her husband assisted her out of bed on this morning, she is home alone currently and waiting on sister in law to come by and help her with bath.    Patient discussed her daughter [name] knows more about what is going on  about her leaving the facility. Placed call to [employer], hipaa information verified. [name] discussed she filed for appeal for patient continued stay at facility . Daughter states she did not hear back regarding appeal until Saturday morning 5/12 . Patient called her son to come and pick her up from facility on Friday, daughter [name] states she found this out on Saturday, as well that appeal had been approved for patient continued stay at facility . [name] discussed concern regarding her mother, requiring assistance with most care, she is able to feed herself but needs help with getting out of bed, dressing and ambulating to bathroom. Daughter has placed call to [place] rehab with her concerns that patient was not signed out of facility, unsure if home health arrangements made and interest in patient returning to facility, she is awaiting return call from administrator. Daughter states she had been working with Education officer, museum regarding medicaid for patient for possible option of long term care.    Plan Placed call to  [facility] to follow up regarding patient discharge plans, able to leave a message on social worker voice mail  Placed call at [facility] health that patient had previously been followed by they do not have orders to follow patient at present.  Placed call to PCP office to arrange office visit for Wednesday, May 16 at 0900, informed patient and daughter.  Placed call to [name], [facility] social worker to discuss situation, able to provide daughter, [name] social worker contact number for questions.  [phone]  Call to  patient daughter [name] reports she has spoken with Administrator, and they where checking with appeal office regarding whether patient can return. Daughter states patient wants to return to facility if possible . Unsuccessful attempt to contact patient on the phone this afternoon.   Will follow up with patient/daughter on next business day and notify PCP office of new situation .   [name], RN, [place] Management [phone]- Mobile [phone]- Toll Free Main Office

## 2016-06-20 ENCOUNTER — Other Ambulatory Visit: Payer: Self-pay | Admitting: *Deleted

## 2016-06-20 NOTE — Patient Outreach (Signed)
[place] [facility] Care Management  06/20/2016  SHAWNDREA RUTKOWSKI 11/07/1931 [id number]  Care coordination call   Placed call to [place], regarding patient possible return to facility per patient daughter, able to leave a message on social worker voice mail line, Suann Larry.   Placed call to PCP office to update regarding patient leaving rehab on Friday, and daughter receiving notification on Saturday that appeal for stay at facility was in her favor.I discussed that per daughter patient wants to return to facility and daughter has spoken with Administrator and waiting response. Received information that patient was set up with [facility] home health at discharge from facility.   [place] call from [employer], patients' daughter, stating that she has spoken with administrator at facility and patient will not be able to return to facility at this time as insurance will not approve ,discussed patient left on her on.   [name] states [facility] home health has called her on today and they plan initial visit on 5/16, and understands patient has PCP visit in am.  Plan Will plan initial home visit on 5/16 as discussed with patient on yesterday and daughter on today.    [name], RN, [place] Management [phone]- Mobile [phone]- Toll Free Main Office

## 2016-06-21 ENCOUNTER — Other Ambulatory Visit: Payer: Self-pay | Admitting: *Deleted

## 2016-06-21 ENCOUNTER — Encounter: Payer: Self-pay | Admitting: *Deleted

## 2016-06-21 DIAGNOSIS — I110 Hypertensive heart disease with heart failure: Secondary | ICD-10-CM | POA: Diagnosis not present

## 2016-06-21 DIAGNOSIS — I509 Heart failure, unspecified: Secondary | ICD-10-CM | POA: Diagnosis not present

## 2016-06-21 DIAGNOSIS — Z9181 History of falling: Secondary | ICD-10-CM | POA: Diagnosis not present

## 2016-06-21 DIAGNOSIS — N184 Chronic kidney disease, stage 4 (severe): Secondary | ICD-10-CM | POA: Diagnosis not present

## 2016-06-21 DIAGNOSIS — S91114D Laceration without foreign body of right lesser toe(s) without damage to nail, subsequent encounter: Secondary | ICD-10-CM | POA: Diagnosis not present

## 2016-06-21 DIAGNOSIS — I2510 Atherosclerotic heart disease of native coronary artery without angina pectoris: Secondary | ICD-10-CM | POA: Diagnosis not present

## 2016-06-21 DIAGNOSIS — M6281 Muscle weakness (generalized): Secondary | ICD-10-CM | POA: Diagnosis not present

## 2016-06-21 DIAGNOSIS — Z1389 Encounter for screening for other disorder: Secondary | ICD-10-CM | POA: Diagnosis not present

## 2016-06-21 DIAGNOSIS — L89612 Pressure ulcer of right heel, stage 2: Secondary | ICD-10-CM | POA: Diagnosis not present

## 2016-06-21 DIAGNOSIS — I69398 Other sequelae of cerebral infarction: Secondary | ICD-10-CM | POA: Diagnosis not present

## 2016-06-21 DIAGNOSIS — E785 Hyperlipidemia, unspecified: Secondary | ICD-10-CM | POA: Diagnosis not present

## 2016-06-21 DIAGNOSIS — E113593 Type 2 diabetes mellitus with proliferative diabetic retinopathy without macular edema, bilateral: Secondary | ICD-10-CM | POA: Diagnosis not present

## 2016-06-21 DIAGNOSIS — Z79899 Other long term (current) drug therapy: Secondary | ICD-10-CM | POA: Diagnosis not present

## 2016-06-21 DIAGNOSIS — E1151 Type 2 diabetes mellitus with diabetic peripheral angiopathy without gangrene: Secondary | ICD-10-CM | POA: Diagnosis not present

## 2016-06-21 DIAGNOSIS — E1122 Type 2 diabetes mellitus with diabetic chronic kidney disease: Secondary | ICD-10-CM | POA: Diagnosis not present

## 2016-06-21 DIAGNOSIS — N183 Chronic kidney disease, stage 3 (moderate): Secondary | ICD-10-CM | POA: Diagnosis not present

## 2016-06-21 DIAGNOSIS — I5042 Chronic combined systolic (congestive) and diastolic (congestive) heart failure: Secondary | ICD-10-CM | POA: Diagnosis not present

## 2016-06-21 NOTE — Patient Outreach (Signed)
[facility]) Care Management   06/21/2016  [name] [name] [birth year] [id number]  [name] [name] is an 83 y.o. female  Subjective:  Patient discussed her visit to Charlott Holler, NP on today, patient discussed her right heel being dark discolored, and wound on between right fifth toe being healed.   Patient discussed she still feeling weak and needs help with getting to bathroom, assist in and out of bed. Patient states she has misplaced medical alert pendant.  Patient states [facility] for home cleaning services resumed yesterday,Nurse from agency to visit her first to resume bath aide services. Patient states her sister in law comes 5 days a week to assist her as well.  Patient discussed it is taking her a little while to get back in the routine of taking her own  medication, checking blood sugar since they had been doing that for her while in the rehab.     Objective:  BP (!) 142/80 (BP Location: Left Arm, Patient Position: Sitting, Cuff Size: Normal)   Pulse 68   Resp 18   SpO2 98%   Patient sitting in wheel chair at kitchen table on arrival , eating a meal.  Review of Systems  Constitutional: Negative.   HENT: Negative.   Eyes: Negative.   Respiratory: Negative.   Cardiovascular: Positive for leg swelling.       2+ edema to lower legs.   Gastrointestinal: Negative.   Genitourinary: Negative.   Musculoskeletal: Negative.   Skin: Negative.   Neurological:       Complaint of general weakness   Endo/Heme/Allergies: Negative.   Psychiatric/Behavioral: Negative.        Forgetfull at times     Physical Exam  Constitutional: She is oriented to person, place, and time. She appears well-developed and well-nourished.  Cardiovascular: Normal rate and normal heart sounds.   Respiratory: Effort normal.  GI: Soft.  Neurological: She is alert and oriented to person, place, and time.  Skin: Skin is warm and dry.     Right 5 the toe ,  dark discolored, right heel dark discolored skin intact   Psychiatric: She has a normal mood and affect. Her behavior is normal. Judgment and thought content normal.    Encounter Medications:   Outpatient Encounter Prescriptions as of 06/21/2016  Medication Sig Note  . ACCU-CHEK SOFTCLIX LANCETS lancets CHECK BLOOD SUGARS TWICE DAILY   . aspirin 81 MG tablet Take 81 mg by mouth daily.   . carvedilol (COREG) 6.25 MG tablet Take 6.25 mg by mouth 2 (two) times daily with a meal.   . furosemide (LASIX) 40 MG tablet TAKE 2 TABLETS BY MOUTH EVERY MORNING FOR FLUID 11/30/2015: 2 tablets in the morning and 2 tablets mid day   . Insulin NPH Isophane & Regular (NOVOLIN 70/30 ) Inject 40-50 Units into the skin. 50 units every morning  50 units every evening   . Insulin Pen Needle (RELION SHORT PEN NEEDLES) 31G X 8 MM MISC 1 each by Does not apply route 2 (two) times daily.   [name] lisinopril (PRINIVIL,ZESTRIL) 40 MG tablet TAKE 1 TABLET DAILY   . polyethylene glycol (MIRALAX / GLYCOLAX) packet USE 1-2 PACKETS DAILY   . pravastatin (PRAVACHOL) 40 MG tablet TAKE 1 TABLET BY MOUTH AT BEDTIME   . ranitidine (ZANTAC) 150 MG tablet Take 150 mg by mouth 2 (two) times daily.   . insulin aspart (NOVOLOG FLEXPEN) 100 UNIT/ML FlexPen Inject into the skin 3 (three) times daily with  meals. Sliding Scale 2 units-6 units patient has list for 100-400 BS readings  100-200, 2 units, 201-250, 4 units, 251--300 6 units, 301-350, 8 units, 351 to 400 10 units.   . medroxyPROGESTERone (PROVERA) 5 MG tablet Take 5 mg by mouth daily.    No facility-administered encounter medications on file as of 06/21/2016.   Patient was recently discharged from hospital and all medications have been reviewed.  Functional Status:   In your present state of health, do you have any difficulty performing the following activities: 06/21/2016 02/17/2016  Hearing? N N  Vision? Y Y  Difficulty concentrating or making decisions? [name]  Walking or  climbing stairs? Y Y  Dressing or bathing? Y Y  Doing errands, shopping? [name]  Preparing Food and eating ? Y Y  Using the Toilet? Y Y  In the past six months, have you accidently leaked urine? Y Y  Do you have problems with loss of bowel control? N Y  Managing your Medications? Y Y  Managing your Finances? [name]  Housekeeping or managing your Housekeeping? Y Y  Some recent data might be hidden    Fall/Depression Screening:    Fall Risk  06/21/2016 05/26/2016 02/17/2016  Falls in the past year? Yes No Yes  Number falls in past yr: 1 - 1  Injury with Fall? No - No  Risk for fall due to : Impaired balance/gait;Impaired mobility Impaired vision;Impaired mobility;Impaired balance/gait Impaired balance/gait;Impaired mobility;History of fall(s)  Follow up Falls evaluation completed;Falls prevention discussed;Education provided - Falls prevention discussed   PHQ 2/9 Scores 06/21/2016 05/26/2016 04/05/2016 12/28/2015 11/22/2015 11/18/2015 02/23/2015  PHQ - 2 Score 1 1 1  0 1 1 0    Assessment:  Transition of care home visit, patient's sister in law present at beginning of visit.   Social  Patient lives at home with her 2 year old husband, that is currently able to assist her in and out of the bed. Patient receives meals on wheels. Patient was active with [facility] care [facility] twice weekly prior to rehab and anticipates resuming after nurse visit for assessment that have contacted patient.    [facility] RN visit on today, will provide tele monitoring scales, PT and OT services to begin.     Recent hysteroscopy surgery , endometrial cancer - No pain, no bleeding    Diabetes/wound - checked blood sugar during visit, patient states this was the first time she checked it since being home. Today's reading 300 it was checked right after patient ate her first meal of day before taking her insulin. Patient states she has not been taking the Humalog sliding scale insulin,  just her scheduled insulin of 70/30 that she kept on her kitchen table.  Will need reinforcement of Diabetes self care, including wound prevention . Patient will need assistance with elevating her leg throughout the day. Home health RN has applied restore type dressing to discolored right heel wound.   Fall Risk - found patient medical alert necklace and placed it on. Will need reinforcement of fall prevention and will benefit from home therapies.    Lower leg swelling - will begin tele monitoring to weights and blood pressure through home health . Patient has all current medications and has began taking as prescribed, medication in pill packaging .     Plan:  Will continue with transition of care program and weekly outreaches, next call in a week. Placed call to PCP office to verify current  list of medication from office visit today. Spoke with [employer].  Reviewed fall prevention measures reviewed  from [facility] packet.  Patient centered care planning and goal setting at visit .  Will route visit note to PCP.    [facility] CM Care Plan Problem One     Most Recent Value  Care Plan Problem One  Recent hospital admission and surgery then discharge to Skilled nursing facility   Role Documenting the Problem One  Care Management [place] for Problem One  Active  THN Long Term Goal (31-90 days)  Patient will not experience a hospital admission in the next 31 days   THN Long Term Goal Start Date  06/19/16  Interventions for Problem One Long Term Goal  Reinforced taking medicaton as prescribed, and notifying MD/RN/HH of new or worsening symptoms   THN CM Short Term Goal #1 (0-30 days)  Patient will report attending all medical appointments in the next 30 days   THN CM Short Term Goal #1 Start Date  06/20/16  Interventions for Short Term Goal #1  attended visit on today to PCP .   THN CM Short Term Goal #2 (0-30 days)  Patient will continue to check blood sugar at least twice daily   THN CM Short  Term Goal #2 Start Date  06/20/16  Interventions for Short Term Goal #2  Patient demonstrated blood sugar monitoring during visit,made sure all supplies in reach.   THN CM Short Term Goal #4 (0-30 days)  Patient will report adherence to wound prevention measures in the next 30 days   THN CM Short Term Goal #4 Start Date  06/21/16  Interventions for Short Term Goal #4  Educated on importance of keeping pressure off right heel , elevating on pillow while in bed, and elevating legs through out the day  while sitting.       [name], RN, [facility] [phone]- Mobile [phone]- Toll Free Main Office

## 2016-06-22 DIAGNOSIS — I110 Hypertensive heart disease with heart failure: Secondary | ICD-10-CM | POA: Diagnosis not present

## 2016-06-22 DIAGNOSIS — I5042 Chronic combined systolic (congestive) and diastolic (congestive) heart failure: Secondary | ICD-10-CM | POA: Diagnosis not present

## 2016-06-22 DIAGNOSIS — L89612 Pressure ulcer of right heel, stage 2: Secondary | ICD-10-CM | POA: Diagnosis not present

## 2016-06-22 DIAGNOSIS — S91114D Laceration without foreign body of right lesser toe(s) without damage to nail, subsequent encounter: Secondary | ICD-10-CM | POA: Diagnosis not present

## 2016-06-22 DIAGNOSIS — E1151 Type 2 diabetes mellitus with diabetic peripheral angiopathy without gangrene: Secondary | ICD-10-CM | POA: Diagnosis not present

## 2016-06-22 DIAGNOSIS — M6281 Muscle weakness (generalized): Secondary | ICD-10-CM | POA: Diagnosis not present

## 2016-06-22 DIAGNOSIS — I69398 Other sequelae of cerebral infarction: Secondary | ICD-10-CM | POA: Diagnosis not present

## 2016-06-22 DIAGNOSIS — N183 Chronic kidney disease, stage 3 (moderate): Secondary | ICD-10-CM | POA: Diagnosis not present

## 2016-06-22 DIAGNOSIS — E1122 Type 2 diabetes mellitus with diabetic chronic kidney disease: Secondary | ICD-10-CM | POA: Diagnosis not present

## 2016-06-23 DIAGNOSIS — M6281 Muscle weakness (generalized): Secondary | ICD-10-CM | POA: Diagnosis not present

## 2016-06-23 DIAGNOSIS — N183 Chronic kidney disease, stage 3 (moderate): Secondary | ICD-10-CM | POA: Diagnosis not present

## 2016-06-23 DIAGNOSIS — E1122 Type 2 diabetes mellitus with diabetic chronic kidney disease: Secondary | ICD-10-CM | POA: Diagnosis not present

## 2016-06-23 DIAGNOSIS — I110 Hypertensive heart disease with heart failure: Secondary | ICD-10-CM | POA: Diagnosis not present

## 2016-06-23 DIAGNOSIS — I5042 Chronic combined systolic (congestive) and diastolic (congestive) heart failure: Secondary | ICD-10-CM | POA: Diagnosis not present

## 2016-06-23 DIAGNOSIS — S91114D Laceration without foreign body of right lesser toe(s) without damage to nail, subsequent encounter: Secondary | ICD-10-CM | POA: Diagnosis not present

## 2016-06-23 DIAGNOSIS — E1151 Type 2 diabetes mellitus with diabetic peripheral angiopathy without gangrene: Secondary | ICD-10-CM | POA: Diagnosis not present

## 2016-06-23 DIAGNOSIS — I69398 Other sequelae of cerebral infarction: Secondary | ICD-10-CM | POA: Diagnosis not present

## 2016-06-23 DIAGNOSIS — L89612 Pressure ulcer of right heel, stage 2: Secondary | ICD-10-CM | POA: Diagnosis not present

## 2016-06-24 DIAGNOSIS — E1151 Type 2 diabetes mellitus with diabetic peripheral angiopathy without gangrene: Secondary | ICD-10-CM | POA: Diagnosis not present

## 2016-06-24 DIAGNOSIS — N183 Chronic kidney disease, stage 3 (moderate): Secondary | ICD-10-CM | POA: Diagnosis not present

## 2016-06-24 DIAGNOSIS — E1122 Type 2 diabetes mellitus with diabetic chronic kidney disease: Secondary | ICD-10-CM | POA: Diagnosis not present

## 2016-06-24 DIAGNOSIS — I110 Hypertensive heart disease with heart failure: Secondary | ICD-10-CM | POA: Diagnosis not present

## 2016-06-24 DIAGNOSIS — I69398 Other sequelae of cerebral infarction: Secondary | ICD-10-CM | POA: Diagnosis not present

## 2016-06-24 DIAGNOSIS — M6281 Muscle weakness (generalized): Secondary | ICD-10-CM | POA: Diagnosis not present

## 2016-06-24 DIAGNOSIS — S91114D Laceration without foreign body of right lesser toe(s) without damage to nail, subsequent encounter: Secondary | ICD-10-CM | POA: Diagnosis not present

## 2016-06-24 DIAGNOSIS — I5042 Chronic combined systolic (congestive) and diastolic (congestive) heart failure: Secondary | ICD-10-CM | POA: Diagnosis not present

## 2016-06-24 DIAGNOSIS — L89612 Pressure ulcer of right heel, stage 2: Secondary | ICD-10-CM | POA: Diagnosis not present

## 2016-06-26 ENCOUNTER — Other Ambulatory Visit: Payer: Self-pay | Admitting: *Deleted

## 2016-06-26 NOTE — Patient Outreach (Signed)
  [facility]) Care Management  [facility] Social Work  06/26/2016  [name] [name] [birth year] [id number]  Subjective:  Patient released from SNF to home per SNF staff.  Objective:  CSW to assist patient with commuity based resources to aide in her well-being, quality of life and overall safety/needs.    Encounter Medications:  Outpatient Encounter Prescriptions as of 06/26/2016  Medication Sig Note  . ACCU-CHEK SOFTCLIX LANCETS lancets CHECK BLOOD SUGARS TWICE DAILY   . aspirin 81 MG tablet Take 81 mg by mouth daily.   . carvedilol (COREG) 6.25 MG tablet Take 6.25 mg by mouth 2 (two) times daily with a meal.   . furosemide (LASIX) 40 MG tablet TAKE 2 TABLETS BY MOUTH EVERY MORNING FOR FLUID 11/30/2015: 2 tablets in the morning and 2 tablets mid day   . insulin aspart (NOVOLOG FLEXPEN) 100 UNIT/ML FlexPen Inject into the skin 3 (three) times daily with meals. Sliding Scale 2 units-6 units patient has list for 100-400 BS readings  100-200, 2 units, 201-250, 4 units, 251--300 6 units, 301-350, 8 units, 351 to 400 10 units.   . Insulin NPH Isophane & Regular (NOVOLIN 70/30 [place]) Inject 40-50 Units into the skin. 50 units every morning  50 units every evening   . Insulin Pen Needle (RELION SHORT PEN NEEDLES) 31G X 8 MM MISC 1 each by Does not apply route 2 (two) times daily.   [name] lisinopril (PRINIVIL,ZESTRIL) 40 MG tablet TAKE 1 TABLET DAILY   . medroxyPROGESTERone (PROVERA) 5 MG tablet Take 5 mg by mouth daily.   . polyethylene glycol (MIRALAX / GLYCOLAX) packet USE 1-2 PACKETS DAILY   . pravastatin (PRAVACHOL) 40 MG tablet TAKE 1 TABLET BY MOUTH AT BEDTIME   . ranitidine (ZANTAC) 150 MG tablet Take 150 mg by mouth 2 (two) times daily.    No facility-administered encounter medications on file as of 06/26/2016.     Functional Status:  In your present state of health, do you have any difficulty performing the following activities: 06/21/2016 02/17/2016  Hearing? N N  Vision?  Y Y  Difficulty concentrating or making decisions? [name]  Walking or climbing stairs? Y Y  Dressing or bathing? Y Y  Doing errands, shopping? [name]  Preparing Food and eating ? Y Y  Using the Toilet? Y Y  In the past six months, have you accidently leaked urine? Y Y  Do you have problems with loss of bowel control? N Y  Managing your Medications? Y Y  Managing your Finances? [name]  Housekeeping or managing your Housekeeping? [name]  Some recent data might be hidden    Fall/Depression Screening:  PHQ 2/9 Scores 06/21/2016 05/26/2016 04/05/2016 12/28/2015 11/22/2015 11/18/2015 02/23/2015  PHQ - 2 Score 1 1 1  0 1 1 0    Assessment: CSW confirmed patient has been released from SNF and is back home with family support and agency ([facility]) in home services.  CSW discussed case closure with [facility] as well as patient as there are currently no CSW needs identified.  CSW advise PCP and [facility] team and perform CSW case closure.   Plan: Close CSW case/referral.   [name], MSW, [place] Worker  [place] [phone]

## 2016-06-27 ENCOUNTER — Ambulatory Visit: Payer: Self-pay | Admitting: *Deleted

## 2016-06-28 ENCOUNTER — Other Ambulatory Visit: Payer: Self-pay | Admitting: *Deleted

## 2016-06-28 DIAGNOSIS — E1151 Type 2 diabetes mellitus with diabetic peripheral angiopathy without gangrene: Secondary | ICD-10-CM | POA: Diagnosis not present

## 2016-06-28 DIAGNOSIS — M6281 Muscle weakness (generalized): Secondary | ICD-10-CM | POA: Diagnosis not present

## 2016-06-28 DIAGNOSIS — I69398 Other sequelae of cerebral infarction: Secondary | ICD-10-CM | POA: Diagnosis not present

## 2016-06-28 DIAGNOSIS — I5042 Chronic combined systolic (congestive) and diastolic (congestive) heart failure: Secondary | ICD-10-CM | POA: Diagnosis not present

## 2016-06-28 DIAGNOSIS — N183 Chronic kidney disease, stage 3 (moderate): Secondary | ICD-10-CM | POA: Diagnosis not present

## 2016-06-28 DIAGNOSIS — S91114D Laceration without foreign body of right lesser toe(s) without damage to nail, subsequent encounter: Secondary | ICD-10-CM | POA: Diagnosis not present

## 2016-06-28 DIAGNOSIS — E1122 Type 2 diabetes mellitus with diabetic chronic kidney disease: Secondary | ICD-10-CM | POA: Diagnosis not present

## 2016-06-28 DIAGNOSIS — I110 Hypertensive heart disease with heart failure: Secondary | ICD-10-CM | POA: Diagnosis not present

## 2016-06-28 DIAGNOSIS — L89612 Pressure ulcer of right heel, stage 2: Secondary | ICD-10-CM | POA: Diagnosis not present

## 2016-06-28 NOTE — Patient Outreach (Signed)
[facility]) Care Management  06/28/2016  [name] [name] 1931/08/01 [id number]   Transition of care call  Spoke with patient, reports she didn't sleep well last night, couldn't get her bed covering situated. She discussed her husband now has some limitation in pulling/lifting to assist her in bed, she asked if insurance will cover for someone to help her get settled in the bed at night. Discussed with patient insurance does not cover that service, discussed family/friend support to help her,and other option of agencies to hire assistance states she can't afford to pay that.  Patient discussed the [facility] bath aide began services on yesterday and her sister in law comes each day for a few hours to stay and assist her.   Patient reports her blood sugars have been running in the 200 range, little higher this morning 242 reports she ate some cookies on last night. Patient reports MD office has called with instruction to increase her 70/30 by 5 units to 55 units in the morning and evening. Patient states she has not taken any novolog insulin for increased blood sugar, but states if it gets 300 she will. Patient discussed her fears of hypoglycemia at night.   Patient discussed she is looking forward to beginning with home PT and OT to help get her strength back. Patient discussed home health RN visited on Saturday and placed a dressing on her right heel area but dressing is coming off, she is not sure when RN will return.   Plan Will place [facility] , spoke Loma Sousa she confirms RN to visit on today and she will have her call patient to update with time today. Spoke with patient to update. Will continue weekly transition of care outreaches, next call in a week.  Will discuss with [name], LCSW regarding patient concern for assistance.   [name], RN, [facility] [phone]- Mobile [phone]- Toll Free  Main Office

## 2016-06-29 DIAGNOSIS — S91114D Laceration without foreign body of right lesser toe(s) without damage to nail, subsequent encounter: Secondary | ICD-10-CM | POA: Diagnosis not present

## 2016-06-29 DIAGNOSIS — I110 Hypertensive heart disease with heart failure: Secondary | ICD-10-CM | POA: Diagnosis not present

## 2016-06-29 DIAGNOSIS — I69398 Other sequelae of cerebral infarction: Secondary | ICD-10-CM | POA: Diagnosis not present

## 2016-06-29 DIAGNOSIS — E1122 Type 2 diabetes mellitus with diabetic chronic kidney disease: Secondary | ICD-10-CM | POA: Diagnosis not present

## 2016-06-29 DIAGNOSIS — L89612 Pressure ulcer of right heel, stage 2: Secondary | ICD-10-CM | POA: Diagnosis not present

## 2016-06-29 DIAGNOSIS — I5042 Chronic combined systolic (congestive) and diastolic (congestive) heart failure: Secondary | ICD-10-CM | POA: Diagnosis not present

## 2016-06-29 DIAGNOSIS — E1151 Type 2 diabetes mellitus with diabetic peripheral angiopathy without gangrene: Secondary | ICD-10-CM | POA: Diagnosis not present

## 2016-06-29 DIAGNOSIS — N183 Chronic kidney disease, stage 3 (moderate): Secondary | ICD-10-CM | POA: Diagnosis not present

## 2016-06-29 DIAGNOSIS — M6281 Muscle weakness (generalized): Secondary | ICD-10-CM | POA: Diagnosis not present

## 2016-06-30 DIAGNOSIS — E1122 Type 2 diabetes mellitus with diabetic chronic kidney disease: Secondary | ICD-10-CM | POA: Diagnosis not present

## 2016-06-30 DIAGNOSIS — I5042 Chronic combined systolic (congestive) and diastolic (congestive) heart failure: Secondary | ICD-10-CM | POA: Diagnosis not present

## 2016-06-30 DIAGNOSIS — N183 Chronic kidney disease, stage 3 (moderate): Secondary | ICD-10-CM | POA: Diagnosis not present

## 2016-06-30 DIAGNOSIS — M6281 Muscle weakness (generalized): Secondary | ICD-10-CM | POA: Diagnosis not present

## 2016-06-30 DIAGNOSIS — S91114D Laceration without foreign body of right lesser toe(s) without damage to nail, subsequent encounter: Secondary | ICD-10-CM | POA: Diagnosis not present

## 2016-06-30 DIAGNOSIS — I69398 Other sequelae of cerebral infarction: Secondary | ICD-10-CM | POA: Diagnosis not present

## 2016-06-30 DIAGNOSIS — E1151 Type 2 diabetes mellitus with diabetic peripheral angiopathy without gangrene: Secondary | ICD-10-CM | POA: Diagnosis not present

## 2016-06-30 DIAGNOSIS — L89612 Pressure ulcer of right heel, stage 2: Secondary | ICD-10-CM | POA: Diagnosis not present

## 2016-06-30 DIAGNOSIS — I110 Hypertensive heart disease with heart failure: Secondary | ICD-10-CM | POA: Diagnosis not present

## 2016-07-03 ENCOUNTER — Encounter (HOSPITAL_COMMUNITY): Payer: Self-pay | Admitting: *Deleted

## 2016-07-03 ENCOUNTER — Emergency Department (HOSPITAL_COMMUNITY): Payer: Medicare HMO

## 2016-07-03 ENCOUNTER — Emergency Department (HOSPITAL_COMMUNITY)
Admission: EM | Admit: 2016-07-03 | Discharge: 2016-07-03 | Disposition: A | Discharge status: Home / Self Care | Payer: Medicare HMO | Attending: Emergency Medicine | Admitting: Emergency Medicine

## 2016-07-03 DIAGNOSIS — L089 Local infection of the skin and subcutaneous tissue, unspecified: Secondary | ICD-10-CM | POA: Diagnosis not present

## 2016-07-03 DIAGNOSIS — Y929 Unspecified place or not applicable: Secondary | ICD-10-CM | POA: Insufficient documentation

## 2016-07-03 DIAGNOSIS — S91301A Unspecified open wound, right foot, initial encounter: Secondary | ICD-10-CM | POA: Diagnosis not present

## 2016-07-03 DIAGNOSIS — E119 Type 2 diabetes mellitus without complications: Secondary | ICD-10-CM | POA: Diagnosis not present

## 2016-07-03 DIAGNOSIS — Y999 Unspecified external cause status: Secondary | ICD-10-CM | POA: Diagnosis not present

## 2016-07-03 DIAGNOSIS — I10 Essential (primary) hypertension: Secondary | ICD-10-CM | POA: Diagnosis not present

## 2016-07-03 DIAGNOSIS — Z7982 Long term (current) use of aspirin: Secondary | ICD-10-CM | POA: Diagnosis not present

## 2016-07-03 DIAGNOSIS — Z794 Long term (current) use of insulin: Secondary | ICD-10-CM | POA: Insufficient documentation

## 2016-07-03 DIAGNOSIS — Z79899 Other long term (current) drug therapy: Secondary | ICD-10-CM | POA: Diagnosis not present

## 2016-07-03 DIAGNOSIS — I2510 Atherosclerotic heart disease of native coronary artery without angina pectoris: Secondary | ICD-10-CM | POA: Insufficient documentation

## 2016-07-03 DIAGNOSIS — Y939 Activity, unspecified: Secondary | ICD-10-CM | POA: Insufficient documentation

## 2016-07-03 DIAGNOSIS — X58XXXA Exposure to other specified factors, initial encounter: Secondary | ICD-10-CM | POA: Diagnosis not present

## 2016-07-03 DIAGNOSIS — Z951 Presence of aortocoronary bypass graft: Secondary | ICD-10-CM | POA: Diagnosis not present

## 2016-07-03 DIAGNOSIS — Z87891 Personal history of nicotine dependence: Secondary | ICD-10-CM | POA: Insufficient documentation

## 2016-07-03 DIAGNOSIS — L98499 Non-pressure chronic ulcer of skin of other sites with unspecified severity: Secondary | ICD-10-CM | POA: Diagnosis not present

## 2016-07-03 LAB — CBC WITH DIFFERENTIAL/PLATELET
BASOS PCT: 0 %
Basophils Absolute: 0 10*3/uL (ref 0.0–0.1)
Eosinophils Absolute: 0.4 10*3/uL (ref 0.0–0.7)
Eosinophils Relative: 4 %
HEMATOCRIT: 37 % (ref 36.0–46.0)
Hemoglobin: 11.6 g/dL — ABNORMAL LOW (ref 12.0–15.0)
Lymphocytes Relative: 44 %
Lymphs Abs: 5 10*3/uL — ABNORMAL HIGH (ref 0.7–4.0)
MCH: 26.1 pg (ref 26.0–34.0)
MCHC: 31.4 g/dL (ref 30.0–36.0)
MCV: 83.3 fL (ref 78.0–100.0)
Monocytes Absolute: 0.6 10*3/uL (ref 0.1–1.0)
Monocytes Relative: 5 %
NEUTROS ABS: 5.4 10*3/uL (ref 1.7–7.7)
NEUTROS PCT: 47 %
Platelets: 313 10*3/uL (ref 150–400)
RBC: 4.44 MIL/uL (ref 3.87–5.11)
RDW: 16.8 % — AB (ref 11.5–15.5)
WBC: 11.4 10*3/uL — ABNORMAL HIGH (ref 4.0–10.5)

## 2016-07-03 LAB — COMPREHENSIVE METABOLIC PANEL
ALK PHOS: 149 U/L — AB (ref 38–126)
ALT: 12 U/L — ABNORMAL LOW (ref 14–54)
ANION GAP: 12 (ref 5–15)
AST: 17 U/L (ref 15–41)
Albumin: 3.6 g/dL (ref 3.5–5.0)
BILIRUBIN TOTAL: 0.4 mg/dL (ref 0.3–1.2)
BUN: 30 mg/dL — ABNORMAL HIGH (ref 6–20)
CALCIUM: 9.3 mg/dL (ref 8.9–10.3)
CO2: 23 mmol/L (ref 22–32)
Chloride: 104 mmol/L (ref 101–111)
Creatinine, Ser: 1.82 mg/dL — ABNORMAL HIGH (ref 0.44–1.00)
GFR calc non Af Amer: 25 mL/min — ABNORMAL LOW (ref >60)
GFR, EST AFRICAN AMERICAN: 29 mL/min — AB (ref >60)
Glucose, Bld: 204 mg/dL — ABNORMAL HIGH (ref 65–99)
POTASSIUM: 4.1 mmol/L (ref 3.5–5.1)
SODIUM: 139 mmol/L (ref 135–145)
Total Protein: 7.1 g/dL (ref 6.5–8.1)

## 2016-07-03 LAB — I-STAT CG4 LACTIC ACID, ED: Lactic Acid, Venous: 1.7 mmol/L (ref 0.5–1.9)

## 2016-07-03 NOTE — ED Provider Notes (Signed)
[facility] DEPT Provider Note   CSN: [id number] Arrival date & time: 07/03/16  1800     History   Chief Complaint Chief Complaint  Patient presents with  . Foot Ulcer    diabetic    HPI [name] [name] is a 83 y.o. female.  Patient with a history of diabetes. Does not have any wound care specialist that she sees. She developed a wound on the heel of her right foot 2 weeks ago. Presented today to an urgent care center for some drainage from it. They had a concern for infection, so sent her here. She denies any systemic symptoms. No fevers or chills.   The history is provided by the patient.  Illness  This is a new problem. Episode onset: 2 weeks. The problem occurs constantly. The problem has not changed since onset.She has tried nothing for the symptoms.    Past Medical History:  Diagnosis Date  . CAD (coronary artery disease) 2008   a. s/p CABG in 2008 with LIMA-LAD, SVG-D1-OM1, and SVG-PDA  . CHF (congestive heart failure) ([place])   . Claudication in peripheral vascular disease ([place]) 05/2012    ABI right 0.81; left 0.98   . CVA (cerebral infarction) 2014  . Daytime somnolence     hoping CPAP will be able to help this  . Diabetes mellitus type 2 with complications, uncontrolled (HCC)    CAD, CVA  . H/O cardiovascular stress test 06/2012   Stable with lateral scar and moderate. Infarction ischemia  . H/O gastritis   . HTN (hypertension)   . Hyperlipidemia LDL goal < 70   . Mallory-Weiss tear    History of  . Obesity (BMI 30.0-34.9)   . OSA on CPAP 2014    patient is just now beginning CPAP    Patient Active Problem List   Diagnosis Date Noted  . Preoperative cardiovascular examination 02/22/2016  . Arteriosclerosis of coronary artery 03/28/2015  . HLD (hyperlipidemia) 03/28/2015  . BP (high blood pressure) 03/28/2015  . Obstructive apnea 03/28/2015  . Cerebral infarction ([place]) 03/28/2015  . Type 2 diabetes mellitus ([place]) 03/28/2015  . UTI (lower  urinary tract infection) 01/27/2015  . SOB (shortness of breath) 12/09/2014  . Chronic diastolic heart failure ([place]) 12/09/2014  . Ischemic stroke ([place]) 11/03/2014  . Chronic constipation 11/03/2014  . Cellulitis of foot 11/03/2014  . Essential (primary) hypertension 11/03/2014  . Edema of foot 11/03/2014  . Polypharmacy 11/03/2014  . Apnea, sleep 11/03/2014  . Type 2 diabetes mellitus with hyperglycemia ([place]) 11/03/2014  . Type 2 diabetes mellitus without complication ([place]) 42/70/6237  . Leg pain, right 02/03/2014  . Edema of both legs 12/20/2012  . Edema leg 12/20/2012  . CAD (coronary artery disease)   . HTN (hypertension)   . Hyperlipidemia with target LDL less than 70   . CVA (cerebral infarction)   . Diabetes mellitus type 2 with complications, uncontrolled ([place])   . OSA on CPAP   . Claudication in peripheral vascular disease ([place]) 05/07/2012  . Intermittent claudication ([place]) 05/07/2012    Past Surgical History:  Procedure Laterality Date  . CHOLECYSTECTOMY    . CORONARY ARTERY BYPASS GRAFT  12/31/2006   CABG x4, LIMA-LAD, sequential VG-diagonal branch of LAD, OM of left circumflex & PLA branch of RCA    OB History    No data available       Home Medications    Prior to Admission medications   Medication Sig Start Date End Date Taking? Authorizing  Provider  ACCU-CHEK SOFTCLIX LANCETS lancets CHECK BLOOD SUGARS TWICE DAILY 07/24/14   Arlis Porta., MD  aspirin 81 MG tablet Take 81 mg by mouth daily.    [provider]  carvedilol (COREG) 6.25 MG tablet Take 6.25 mg by mouth 2 (two) times daily with a meal.    [provider]  furosemide (LASIX) 40 MG tablet TAKE 2 TABLETS BY MOUTH EVERY MORNING FOR FLUID 03/11/15   Arlis Porta., MD  insulin aspart (NOVOLOG FLEXPEN) 100 UNIT/ML FlexPen Inject into the skin 3 (three) times daily with meals. Sliding Scale 2 units-6 units patient has list for 100-400 BS readings  100-200, 2 units,  201-250, 4 units, 251--300 6 units, 301-350, 8 units, 351 to 400 10 units.    [provider]  Insulin NPH Isophane & Regular (NOVOLIN 70/30 [place]) Inject 40-50 Units into the skin. 50 units every morning  50 units every evening    [provider]  Insulin Pen Needle (RELION SHORT PEN NEEDLES) 31G X 8 MM MISC 1 each by Does not apply route 2 (two) times daily. 10/15/14   Arlis Porta., MD  lisinopril (PRINIVIL,ZESTRIL) 40 MG tablet TAKE 1 TABLET DAILY 08/04/15   Arlis Porta., MD  medroxyPROGESTERone (PROVERA) 5 MG tablet Take 5 mg by mouth daily.    [provider]  polyethylene glycol (MIRALAX / GLYCOLAX) packet USE 1-2 PACKETS DAILY 07/13/14   Arlis Porta., MD  pravastatin (PRAVACHOL) 40 MG tablet TAKE 1 TABLET BY MOUTH AT BEDTIME 05/06/15   Arlis Porta., MD  ranitidine (ZANTAC) 150 MG tablet Take 150 mg by mouth 2 (two) times daily.    [provider]    Family History Family History  Problem Relation Age of Onset  . Stroke Mother   . Diabetes Sister   . Diabetes Brother     Social History Social History  Substance Use Topics  . Smoking status: Former Research scientist (life sciences)  . Smokeless tobacco: Never Used     Comment: 50 years ago  . Alcohol use No     Allergies   Patient has no known allergies.   Review of Systems Review of Systems  Constitutional: Negative for chills and fever.  Skin: Positive for wound.     Physical Exam Updated Vital Signs BP (!) 185/63   Pulse 73   Temp 98.3 F (36.8 C) (Oral)   Resp 18   Ht 5\' 3"  (1.6 m)   Wt 92.1 kg (203 lb)   SpO2 100%   BMI 35.96 kg/m   Physical Exam  Constitutional: She appears well-developed and well-nourished. No distress.  HENT:  Head: Normocephalic and atraumatic.  Eyes: Conjunctivae are normal.  Neck: Neck supple.  Cardiovascular: Normal rate and regular rhythm.   No murmur heard. Pulmonary/Chest: Effort normal and breath sounds normal. No respiratory distress.    Abdominal: Soft. There is no tenderness.  Musculoskeletal: She exhibits no edema.  Right foot with heal wound. No current discharge. No purulence. No surrounding erythema, warmth, induration. No fluctuance. Right foot also has a black discoloration to the small toe that the patient reports his been there for over a year. No other wounds or signs of infection.  Neurological: She is alert.  Skin: Skin is warm and dry.  Psychiatric: She has a normal mood and affect.  Nursing note and vitals reviewed.    ED Treatments / Results  Labs (all labs ordered are listed, but only  abnormal results are displayed) Labs Reviewed  COMPREHENSIVE METABOLIC PANEL - Abnormal; Notable for the following:       Result Value   Glucose, Bld 204 (*)    BUN 30 (*)    Creatinine, Ser 1.82 (*)    ALT 12 (*)    Alkaline Phosphatase 149 (*)    GFR calc non Af Amer 25 (*)    GFR calc Af Amer 29 (*)    All other components within normal limits  CBC WITH DIFFERENTIAL/PLATELET - Abnormal; Notable for the following:    WBC 11.4 (*)    Hemoglobin 11.6 (*)    RDW 16.8 (*)    Lymphs Abs 5.0 (*)    All other components within normal limits  I-STAT CG4 LACTIC ACID, ED    EKG  EKG Interpretation None       Radiology Dg Foot Complete Right  Result Date: 07/03/2016 CLINICAL DATA:  Foot infection at the heel in between the fourth and fifth toes x2 weeks. EXAM: RIGHT FOOT COMPLETE - 3+ VIEW COMPARISON:  None. FINDINGS: Generalized diffuse soft tissue swelling of the visualized right ankle and foot. Patchy bony demineralization is noted of the toes without evidence of fracture nor frank bone destruction. Vascular calcifications are identified along the posterior tibial and dorsalis pedis. IMPRESSION: Osteopenic appearance of the foot without radiographic evidence of acute osteomyelitis. Diffuse soft tissue swelling of the foot and ankle. Electronically Signed   By: Ashley Royalty M.D.   On: 07/03/2016 19:11     Procedures Procedures (including critical care time)  Medications Ordered in ED Medications - No data to display   Initial Impression / Assessment and Plan / ED Course  I have reviewed the triage vital signs and the nursing notes.  Pertinent labs & imaging results that were available during my care of the patient were reviewed by me and considered in my medical decision making (see chart for details).     Feel the patient has diabetic ulcers on her feet without any evidence of infection currently. She denies any systemic symptoms. Her vital signs are normal and stable. She does have a mild leukocytosis, no left shift. No lactic acidosis. X-ray without evidence of acute osteomyelitis.  Doubt acute infection on this patient. Provided her information to follow up with outpatient wound care center. Told her to return to the emergency department should she have onset of symptoms consistent with infectious process.  Final Clinical Impressions(s) / ED Diagnoses   Final diagnoses:  Open wound of right heel, initial encounter    New Prescriptions Discharge Medication List as of 07/03/2016  9:27 PM       Maryan Puls, MD 07/04/16 Kerrin Champagne    Jola Schmidt, MD 07/04/16 2325

## 2016-07-03 NOTE — ED Triage Notes (Signed)
Pt sent here by UC for infection to heel of R foot and between 4th and 5th toe x 2 weeks.  Pt is diabetic.

## 2016-07-04 DIAGNOSIS — S91114D Laceration without foreign body of right lesser toe(s) without damage to nail, subsequent encounter: Secondary | ICD-10-CM | POA: Diagnosis not present

## 2016-07-04 DIAGNOSIS — I110 Hypertensive heart disease with heart failure: Secondary | ICD-10-CM | POA: Diagnosis not present

## 2016-07-04 DIAGNOSIS — I69398 Other sequelae of cerebral infarction: Secondary | ICD-10-CM | POA: Diagnosis not present

## 2016-07-04 DIAGNOSIS — M6281 Muscle weakness (generalized): Secondary | ICD-10-CM | POA: Diagnosis not present

## 2016-07-04 DIAGNOSIS — I5042 Chronic combined systolic (congestive) and diastolic (congestive) heart failure: Secondary | ICD-10-CM | POA: Diagnosis not present

## 2016-07-04 DIAGNOSIS — E1151 Type 2 diabetes mellitus with diabetic peripheral angiopathy without gangrene: Secondary | ICD-10-CM | POA: Diagnosis not present

## 2016-07-04 DIAGNOSIS — E1122 Type 2 diabetes mellitus with diabetic chronic kidney disease: Secondary | ICD-10-CM | POA: Diagnosis not present

## 2016-07-04 DIAGNOSIS — L89612 Pressure ulcer of right heel, stage 2: Secondary | ICD-10-CM | POA: Diagnosis not present

## 2016-07-04 DIAGNOSIS — N183 Chronic kidney disease, stage 3 (moderate): Secondary | ICD-10-CM | POA: Diagnosis not present

## 2016-07-05 ENCOUNTER — Other Ambulatory Visit: Payer: Self-pay | Admitting: *Deleted

## 2016-07-05 NOTE — Patient Outreach (Addendum)
[facility]) Care Management  07/05/2016  [name] [name] September 17, 1931 [id number]   Transition of care call  Placed call to patient,reports she is feeling better right now, reports she was moving a little slower on today and off track earlier and didn't check her blood sugar. Patient discussed her visit to emergency room on Sunday, due to concern regarding right heel wound and some drainage at area. Patient discussed referral to foot doctor and her daughter is taking care of scheduling that visit.  Patient reports home health RN visited on yesterday and applied dressing to foot, and encouraged her to keep foot elevated. [facility] RN to visit again on 5/31.  Discussed with patient importance of taking medications as prescribed and checking blood sugar. Discussed with patient need to schedule follow PCP office visit as recommended as discharge instructions after recent ED visit, patient requested help with scheduling visit Patient reports her daughter [name] [name] has scheduled her visit with podiatrist as recommended as discharge instructions. Patient requesting assistance with arranging transportation to Podiatry in [place] transportation. She also wants to use this service for her upcoming visit to kidney doctor appointment in June  she does not remember the date, requesting that I call her daughter [name] [name] to find out.   Plan Placed call to PCP office to arrange office visit per discharge instructions, scheduled for June 5, at 3 pm and patient agreeable states her husband can provide transportation there.Will meet patient in office for visit.  Placed call to [facility] transportation logistic care [phone]  to make reservations for June 7 11 am appointment to [place] office, patient to arrive by 1045 reservation  Number [phone], patient pickup time from her home  is 0930. Patient/family will need to call transportation service when finished appointment for  return ride home, [phone] Placed call to [name] [name], daughter to find out when appointment with Kidney doctor, verified appointment is June 13 at 63 am. Provided [name] [name] with transportation service reservation line  and return ride number also provided her with reservation number for patient office visit on 5/7.   Placed call to [facility] from [facility] regarding progress of right heel wound, no answer able to leave hipaa compliant message requesting return call.     [name], RN, [place] Management [phone]- Mobile [phone]- Toll Free Main Office

## 2016-07-05 NOTE — Progress Notes (Signed)
This encounter was created in error - please disregard.

## 2016-07-06 DIAGNOSIS — M6281 Muscle weakness (generalized): Secondary | ICD-10-CM | POA: Diagnosis not present

## 2016-07-06 DIAGNOSIS — N183 Chronic kidney disease, stage 3 (moderate): Secondary | ICD-10-CM | POA: Diagnosis not present

## 2016-07-06 DIAGNOSIS — I5042 Chronic combined systolic (congestive) and diastolic (congestive) heart failure: Secondary | ICD-10-CM | POA: Diagnosis not present

## 2016-07-06 DIAGNOSIS — I69398 Other sequelae of cerebral infarction: Secondary | ICD-10-CM | POA: Diagnosis not present

## 2016-07-06 DIAGNOSIS — E1122 Type 2 diabetes mellitus with diabetic chronic kidney disease: Secondary | ICD-10-CM | POA: Diagnosis not present

## 2016-07-06 DIAGNOSIS — S91114D Laceration without foreign body of right lesser toe(s) without damage to nail, subsequent encounter: Secondary | ICD-10-CM | POA: Diagnosis not present

## 2016-07-06 DIAGNOSIS — I110 Hypertensive heart disease with heart failure: Secondary | ICD-10-CM | POA: Diagnosis not present

## 2016-07-06 DIAGNOSIS — E1151 Type 2 diabetes mellitus with diabetic peripheral angiopathy without gangrene: Secondary | ICD-10-CM | POA: Diagnosis not present

## 2016-07-06 DIAGNOSIS — L89612 Pressure ulcer of right heel, stage 2: Secondary | ICD-10-CM | POA: Diagnosis not present

## 2016-07-07 DIAGNOSIS — L89612 Pressure ulcer of right heel, stage 2: Secondary | ICD-10-CM | POA: Diagnosis not present

## 2016-07-07 DIAGNOSIS — E1151 Type 2 diabetes mellitus with diabetic peripheral angiopathy without gangrene: Secondary | ICD-10-CM | POA: Diagnosis not present

## 2016-07-07 DIAGNOSIS — S91114D Laceration without foreign body of right lesser toe(s) without damage to nail, subsequent encounter: Secondary | ICD-10-CM | POA: Diagnosis not present

## 2016-07-07 DIAGNOSIS — I110 Hypertensive heart disease with heart failure: Secondary | ICD-10-CM | POA: Diagnosis not present

## 2016-07-07 DIAGNOSIS — I69398 Other sequelae of cerebral infarction: Secondary | ICD-10-CM | POA: Diagnosis not present

## 2016-07-07 DIAGNOSIS — I5042 Chronic combined systolic (congestive) and diastolic (congestive) heart failure: Secondary | ICD-10-CM | POA: Diagnosis not present

## 2016-07-07 DIAGNOSIS — E1122 Type 2 diabetes mellitus with diabetic chronic kidney disease: Secondary | ICD-10-CM | POA: Diagnosis not present

## 2016-07-07 DIAGNOSIS — N183 Chronic kidney disease, stage 3 (moderate): Secondary | ICD-10-CM | POA: Diagnosis not present

## 2016-07-07 DIAGNOSIS — M6281 Muscle weakness (generalized): Secondary | ICD-10-CM | POA: Diagnosis not present

## 2016-07-11 ENCOUNTER — Other Ambulatory Visit: Payer: Self-pay | Admitting: *Deleted

## 2016-07-11 DIAGNOSIS — Z79899 Other long term (current) drug therapy: Secondary | ICD-10-CM | POA: Diagnosis not present

## 2016-07-11 DIAGNOSIS — E1122 Type 2 diabetes mellitus with diabetic chronic kidney disease: Secondary | ICD-10-CM | POA: Diagnosis not present

## 2016-07-11 DIAGNOSIS — I69398 Other sequelae of cerebral infarction: Secondary | ICD-10-CM | POA: Diagnosis not present

## 2016-07-11 DIAGNOSIS — L89612 Pressure ulcer of right heel, stage 2: Secondary | ICD-10-CM | POA: Diagnosis not present

## 2016-07-11 DIAGNOSIS — M6281 Muscle weakness (generalized): Secondary | ICD-10-CM | POA: Diagnosis not present

## 2016-07-11 DIAGNOSIS — E113593 Type 2 diabetes mellitus with proliferative diabetic retinopathy without macular edema, bilateral: Secondary | ICD-10-CM | POA: Diagnosis not present

## 2016-07-11 DIAGNOSIS — E1151 Type 2 diabetes mellitus with diabetic peripheral angiopathy without gangrene: Secondary | ICD-10-CM | POA: Diagnosis not present

## 2016-07-11 DIAGNOSIS — N183 Chronic kidney disease, stage 3 (moderate): Secondary | ICD-10-CM | POA: Diagnosis not present

## 2016-07-11 DIAGNOSIS — I5042 Chronic combined systolic (congestive) and diastolic (congestive) heart failure: Secondary | ICD-10-CM | POA: Diagnosis not present

## 2016-07-11 DIAGNOSIS — I110 Hypertensive heart disease with heart failure: Secondary | ICD-10-CM | POA: Diagnosis not present

## 2016-07-11 DIAGNOSIS — L89610 Pressure ulcer of right heel, unstageable: Secondary | ICD-10-CM | POA: Diagnosis not present

## 2016-07-11 DIAGNOSIS — S91114D Laceration without foreign body of right lesser toe(s) without damage to nail, subsequent encounter: Secondary | ICD-10-CM | POA: Diagnosis not present

## 2016-07-11 DIAGNOSIS — Z6836 Body mass index (BMI) 36.0-36.9, adult: Secondary | ICD-10-CM | POA: Diagnosis not present

## 2016-07-11 DIAGNOSIS — E669 Obesity, unspecified: Secondary | ICD-10-CM | POA: Diagnosis not present

## 2016-07-11 NOTE — Patient Outreach (Signed)
[name] [name]) Care Management   07/11/2016  [name] [name] 12/02/1931 [id number]  [name] [name] is an 83 y.o. female   Met with patient at her [facility] in [place] for her scheduled post ED office visit.   Subjective:  Patient discussed home health RN, PT, and OT from [name] visited her in home on this morning and she is a little tired.  Patient discussed concern regarding wound on her right heel.   Objective:   ROS  Physical Exam  Skin:       Encounter Medications:   Outpatient Encounter Prescriptions as of 07/11/2016  Medication Sig Note  . ACCU-CHEK SOFTCLIX LANCETS lancets CHECK BLOOD SUGARS TWICE DAILY   . aspirin 81 MG tablet Take 81 mg by mouth daily.   . carvedilol (COREG) 6.25 MG tablet Take 6.25 mg by mouth 2 (two) times daily with a meal.   . furosemide (LASIX) 40 MG tablet TAKE 2 TABLETS BY MOUTH EVERY MORNING FOR FLUID 11/30/2015: 2 tablets in the morning and 2 tablets mid day   . insulin aspart (NOVOLOG FLEXPEN) 100 UNIT/ML FlexPen Inject into the skin 3 (three) times daily with meals. Sliding Scale 2 units-6 units patient has list for 100-400 BS readings  100-200, 2 units, 201-250, 4 units, 251--300 6 units, 301-350, 8 units, 351 to 400 10 units.   . Insulin NPH Isophane & Regular (NOVOLIN 70/30 [place]) Inject 40-50 Units into the skin. 50 units every morning  50 units every evening   . Insulin Pen Needle (RELION SHORT PEN NEEDLES) 31G X 8 MM MISC 1 each by Does not apply route 2 (two) times daily.   [name] lisinopril (PRINIVIL,ZESTRIL) 40 MG tablet TAKE 1 TABLET DAILY   . medroxyPROGESTERone (PROVERA) 5 MG tablet Take 5 mg by mouth daily.   . polyethylene glycol (MIRALAX / GLYCOLAX) packet USE 1-2 PACKETS DAILY   . pravastatin (PRAVACHOL) 40 MG tablet TAKE 1 TABLET BY MOUTH AT BEDTIME   . ranitidine (ZANTAC) 150 MG tablet Take 150 mg by mouth 2 (two) times daily.    No facility-administered encounter medications on file as of  07/11/2016.     Functional Status:   In your present state of health, do you have any difficulty performing the following activities: 06/21/2016 02/17/2016  Hearing? N N  Vision? Y Y  Difficulty concentrating or making decisions? [name] [name]  Walking or climbing stairs? Y Y  Dressing or bathing? Y Y  Doing errands, shopping? [name] [name]  Preparing Food and eating ? Y Y  Using the Toilet? Y Y  In the past six months, have you accidently leaked urine? Y Y  Do you have problems with loss of bowel control? N Y  Managing your Medications? Y Y  Managing your Finances? [name] [name]  Housekeeping or managing your Housekeeping? Y Y  Some recent data might be hidden    Fall/Depression Screening:    Fall Risk  06/21/2016 05/26/2016 02/17/2016  Falls in the past year? Yes No Yes  Number falls in past yr: 1 - 1  Injury with Fall? No - No  Risk for fall due to : Impaired balance/gait;Impaired mobility Impaired vision;Impaired mobility;Impaired balance/gait Impaired balance/gait;Impaired mobility;History of fall(s)  Follow up Falls evaluation completed;Falls prevention discussed;Education provided - Falls prevention discussed   PHQ 2/9 Scores 06/21/2016 05/26/2016 04/05/2016 12/28/2015 11/22/2015 11/18/2015 02/23/2015  PHQ - 2 Score '1 1 1 ' 0 1 1 0    Assessment:    PCP office visit, with  patient sister in law and her son [name] [name] .   Right heel wound  Wound exam by PCP, site cleaned and redressed. Patient active with home  Health care for local  Wound care, PCP recommendation is referral to wound care clinic instead of follow up with Podiatry at this time and recommend cancelling appointment at this time.  Needs reinforcement of keeping legs elevated while sitting during the day and elevating leg on pillow while in bed.   Diabetes Encouraged to continue to check blood sugar , needs reinforcement on diabetes management .    Plan:  Will follow up with patient by telephone in next week. Will place follow up call to  daughter [name] [name] regarding plans for wound care clinic and cancelling podiatry visit per PCP recommendations able to leave a message requesting a return call.  Will call [employer] transportation regarding cancelling transportation arranged  for 6/7.   [name] Draft, RN, [place] Management [phone]- Mobile [phone]- Toll Free Main Office

## 2016-07-12 ENCOUNTER — Other Ambulatory Visit: Payer: Self-pay | Admitting: *Deleted

## 2016-07-12 DIAGNOSIS — L89612 Pressure ulcer of right heel, stage 2: Secondary | ICD-10-CM | POA: Diagnosis not present

## 2016-07-12 DIAGNOSIS — I5042 Chronic combined systolic (congestive) and diastolic (congestive) heart failure: Secondary | ICD-10-CM | POA: Diagnosis not present

## 2016-07-12 DIAGNOSIS — I69398 Other sequelae of cerebral infarction: Secondary | ICD-10-CM | POA: Diagnosis not present

## 2016-07-12 DIAGNOSIS — E1151 Type 2 diabetes mellitus with diabetic peripheral angiopathy without gangrene: Secondary | ICD-10-CM | POA: Diagnosis not present

## 2016-07-12 DIAGNOSIS — N183 Chronic kidney disease, stage 3 (moderate): Secondary | ICD-10-CM | POA: Diagnosis not present

## 2016-07-12 DIAGNOSIS — E1122 Type 2 diabetes mellitus with diabetic chronic kidney disease: Secondary | ICD-10-CM | POA: Diagnosis not present

## 2016-07-12 DIAGNOSIS — M6281 Muscle weakness (generalized): Secondary | ICD-10-CM | POA: Diagnosis not present

## 2016-07-12 DIAGNOSIS — S91114D Laceration without foreign body of right lesser toe(s) without damage to nail, subsequent encounter: Secondary | ICD-10-CM | POA: Diagnosis not present

## 2016-07-12 DIAGNOSIS — I110 Hypertensive heart disease with heart failure: Secondary | ICD-10-CM | POA: Diagnosis not present

## 2016-07-12 NOTE — Patient Outreach (Signed)
[facility]) Care Management  07/12/2016  [name] [name] 03-06-1931 [id number]  Received return call from [name], daughter of patient she was following up in response to call placed to her on yesterday per patient request after office visit on yesterday to discuss MD plan. Explained to Daughter  , Gardiner Rhyme NP , recommends patient see wound care doctor/clinic related to right heel wound instead of podiatrist appointment that had been previously recommended after ED visit. Notified her that appointment with Foot center and transportation has been cancelled for 6/7 as recommended.   Daughter asked where referral for wound clinic was sent to , notified her PCP mentioned [place] and patient stated that  her husband would be able to take her to visit. Daughter prefers patient wound care clinic referral be in [place] closer to her, so she can help with transportation after visit as patients' husband is not to drive out of town.   Placed call to [facility] able to speak with receptionist to notify of daughter request to change wound care clinic follow up in [place] area, office will follow up and notify [name], as she will be helping with transportation from appointment, and plans for patient to use [employer] transportation to visit and daughter assist with return transportation.    Plan Will place follow up call in next week.  [name], RN, [place] Management [phone]- Mobile [phone]- Toll Free Main Office

## 2016-07-13 ENCOUNTER — Ambulatory Visit: Payer: Medicare Other | Admitting: Podiatry

## 2016-07-13 DIAGNOSIS — E1122 Type 2 diabetes mellitus with diabetic chronic kidney disease: Secondary | ICD-10-CM | POA: Diagnosis not present

## 2016-07-13 DIAGNOSIS — I110 Hypertensive heart disease with heart failure: Secondary | ICD-10-CM | POA: Diagnosis not present

## 2016-07-13 DIAGNOSIS — M6281 Muscle weakness (generalized): Secondary | ICD-10-CM | POA: Diagnosis not present

## 2016-07-13 DIAGNOSIS — N183 Chronic kidney disease, stage 3 (moderate): Secondary | ICD-10-CM | POA: Diagnosis not present

## 2016-07-13 DIAGNOSIS — S91114D Laceration without foreign body of right lesser toe(s) without damage to nail, subsequent encounter: Secondary | ICD-10-CM | POA: Diagnosis not present

## 2016-07-13 DIAGNOSIS — E1151 Type 2 diabetes mellitus with diabetic peripheral angiopathy without gangrene: Secondary | ICD-10-CM | POA: Diagnosis not present

## 2016-07-13 DIAGNOSIS — L89612 Pressure ulcer of right heel, stage 2: Secondary | ICD-10-CM | POA: Diagnosis not present

## 2016-07-13 DIAGNOSIS — I69398 Other sequelae of cerebral infarction: Secondary | ICD-10-CM | POA: Diagnosis not present

## 2016-07-13 DIAGNOSIS — I5042 Chronic combined systolic (congestive) and diastolic (congestive) heart failure: Secondary | ICD-10-CM | POA: Diagnosis not present

## 2016-07-14 ENCOUNTER — Other Ambulatory Visit: Payer: Self-pay | Admitting: *Deleted

## 2016-07-14 NOTE — Patient Outreach (Addendum)
[facility]) Care Management  07/14/2016  [name] [name] October 25, 1931 [id number]   Incoming call from patients' daughter [name] [name] requesting assistance with arranging transportation to patient upcoming medical appointments on next week, she states she will met patient at appointments in [place] because she lives nearby and will  provide return transportation to home.   Placed call to [facility] transportation to arrange office visits to be picked up at her home address,  requested that patient will be in a manual wheelchair  for 6/13 at 11 am visit  with Dr.Singh Kidney specialist., reservation number :[phone] pick up time between 9:30 and 10 in [place] with lift and her husband may accompany.  6/15  Appointment with Dr.Britto at wound center in [place] at 1245, reservation number : [phone], pick up time between 1115 and 1145 in [place] with lift.and her husband may accompany.   Will provide patient daughter [name] [name]  with  contact information for [employer] transportation to call and schedule further medical appointments also provide reservation number as well as pick up times for medical appointments on next week.   [name], RN, [place] Management [phone]- Mobile [phone]- Toll Free Main Office

## 2016-07-18 ENCOUNTER — Other Ambulatory Visit: Payer: Self-pay | Admitting: *Deleted

## 2016-07-18 DIAGNOSIS — I5042 Chronic combined systolic (congestive) and diastolic (congestive) heart failure: Secondary | ICD-10-CM | POA: Diagnosis not present

## 2016-07-18 DIAGNOSIS — I69398 Other sequelae of cerebral infarction: Secondary | ICD-10-CM | POA: Diagnosis not present

## 2016-07-18 DIAGNOSIS — E1122 Type 2 diabetes mellitus with diabetic chronic kidney disease: Secondary | ICD-10-CM | POA: Diagnosis not present

## 2016-07-18 DIAGNOSIS — S91114D Laceration without foreign body of right lesser toe(s) without damage to nail, subsequent encounter: Secondary | ICD-10-CM | POA: Diagnosis not present

## 2016-07-18 DIAGNOSIS — E1151 Type 2 diabetes mellitus with diabetic peripheral angiopathy without gangrene: Secondary | ICD-10-CM | POA: Diagnosis not present

## 2016-07-18 DIAGNOSIS — L89612 Pressure ulcer of right heel, stage 2: Secondary | ICD-10-CM | POA: Diagnosis not present

## 2016-07-18 DIAGNOSIS — I110 Hypertensive heart disease with heart failure: Secondary | ICD-10-CM | POA: Diagnosis not present

## 2016-07-18 DIAGNOSIS — M6281 Muscle weakness (generalized): Secondary | ICD-10-CM | POA: Diagnosis not present

## 2016-07-18 DIAGNOSIS — N183 Chronic kidney disease, stage 3 (moderate): Secondary | ICD-10-CM | POA: Diagnosis not present

## 2016-07-18 NOTE — Patient Outreach (Signed)
[facility]) Care Management  07/18/2016  [name] [name] January 15, 1932 [id number]  Transition of care call  Spoke with patient reports she is "making it' as he usually states when asked how she is doing. Patient discussed she had home health visits from , OT, PT and home health RN on today. Patient reports RN changed dressing to right heel. Patient reports her daughter has arranged to wound clinic visit to be on the same day as office visit with kidney Dr.. Patient is aware [place] transportation will pick her up around 0930 at home in wheelchair assess ible Lucianne Lei to transport to appointment and her daughter will assist with transportation to next appointment and transportation home.  Discussed patient recent blood sugar states she got off schedule this morning with home health arriving at 8 am, and she forgot to check it  Patient reports she did not have enough insulin for her morning dose on today, states she won't let that happen again, her son to pick up insulin this afternoon. . Reinforced with patient importance of continuing to monitor blood sugar, limit sweet cakes , regular soda in diet to control blood sugar and help with wound healing.   Plan Will continue weekly transition of care outreach , next call in a week. Reinforced importance Diabetes care, taking insulin checking blood sugar.    [name], RN, [place] Management [phone]- Mobile [phone]- Toll Free Main Office

## 2016-07-19 ENCOUNTER — Encounter: Payer: Medicare HMO | Attending: Internal Medicine | Admitting: Internal Medicine

## 2016-07-19 DIAGNOSIS — J449 Chronic obstructive pulmonary disease, unspecified: Secondary | ICD-10-CM | POA: Insufficient documentation

## 2016-07-19 DIAGNOSIS — K219 Gastro-esophageal reflux disease without esophagitis: Secondary | ICD-10-CM | POA: Diagnosis not present

## 2016-07-19 DIAGNOSIS — L97411 Non-pressure chronic ulcer of right heel and midfoot limited to breakdown of skin: Secondary | ICD-10-CM | POA: Diagnosis not present

## 2016-07-19 DIAGNOSIS — I872 Venous insufficiency (chronic) (peripheral): Secondary | ICD-10-CM | POA: Diagnosis not present

## 2016-07-19 DIAGNOSIS — L97412 Non-pressure chronic ulcer of right heel and midfoot with fat layer exposed: Secondary | ICD-10-CM | POA: Diagnosis not present

## 2016-07-19 DIAGNOSIS — Z794 Long term (current) use of insulin: Secondary | ICD-10-CM | POA: Insufficient documentation

## 2016-07-19 DIAGNOSIS — E1122 Type 2 diabetes mellitus with diabetic chronic kidney disease: Secondary | ICD-10-CM | POA: Diagnosis not present

## 2016-07-19 DIAGNOSIS — E1142 Type 2 diabetes mellitus with diabetic polyneuropathy: Secondary | ICD-10-CM | POA: Diagnosis not present

## 2016-07-19 DIAGNOSIS — R600 Localized edema: Secondary | ICD-10-CM | POA: Diagnosis not present

## 2016-07-19 DIAGNOSIS — E1129 Type 2 diabetes mellitus with other diabetic kidney complication: Secondary | ICD-10-CM | POA: Diagnosis not present

## 2016-07-19 DIAGNOSIS — I10 Essential (primary) hypertension: Secondary | ICD-10-CM | POA: Diagnosis not present

## 2016-07-19 DIAGNOSIS — N183 Chronic kidney disease, stage 3 (moderate): Secondary | ICD-10-CM | POA: Diagnosis not present

## 2016-07-19 DIAGNOSIS — I129 Hypertensive chronic kidney disease with stage 1 through stage 4 chronic kidney disease, or unspecified chronic kidney disease: Secondary | ICD-10-CM | POA: Diagnosis not present

## 2016-07-19 DIAGNOSIS — E11621 Type 2 diabetes mellitus with foot ulcer: Secondary | ICD-10-CM | POA: Diagnosis not present

## 2016-07-19 DIAGNOSIS — I2510 Atherosclerotic heart disease of native coronary artery without angina pectoris: Secondary | ICD-10-CM | POA: Insufficient documentation

## 2016-07-19 DIAGNOSIS — M1990 Unspecified osteoarthritis, unspecified site: Secondary | ICD-10-CM | POA: Diagnosis not present

## 2016-07-19 DIAGNOSIS — I739 Peripheral vascular disease, unspecified: Secondary | ICD-10-CM | POA: Diagnosis not present

## 2016-07-19 DIAGNOSIS — E1151 Type 2 diabetes mellitus with diabetic peripheral angiopathy without gangrene: Secondary | ICD-10-CM | POA: Insufficient documentation

## 2016-07-19 DIAGNOSIS — R6889 Other general symptoms and signs: Secondary | ICD-10-CM | POA: Diagnosis not present

## 2016-07-20 DIAGNOSIS — N183 Chronic kidney disease, stage 3 (moderate): Secondary | ICD-10-CM | POA: Diagnosis not present

## 2016-07-20 DIAGNOSIS — I5042 Chronic combined systolic (congestive) and diastolic (congestive) heart failure: Secondary | ICD-10-CM | POA: Diagnosis not present

## 2016-07-20 DIAGNOSIS — M6281 Muscle weakness (generalized): Secondary | ICD-10-CM | POA: Diagnosis not present

## 2016-07-20 DIAGNOSIS — E1122 Type 2 diabetes mellitus with diabetic chronic kidney disease: Secondary | ICD-10-CM | POA: Diagnosis not present

## 2016-07-20 DIAGNOSIS — I69398 Other sequelae of cerebral infarction: Secondary | ICD-10-CM | POA: Diagnosis not present

## 2016-07-20 DIAGNOSIS — E1151 Type 2 diabetes mellitus with diabetic peripheral angiopathy without gangrene: Secondary | ICD-10-CM | POA: Diagnosis not present

## 2016-07-20 DIAGNOSIS — I110 Hypertensive heart disease with heart failure: Secondary | ICD-10-CM | POA: Diagnosis not present

## 2016-07-20 DIAGNOSIS — S91114D Laceration without foreign body of right lesser toe(s) without damage to nail, subsequent encounter: Secondary | ICD-10-CM | POA: Diagnosis not present

## 2016-07-20 DIAGNOSIS — L89612 Pressure ulcer of right heel, stage 2: Secondary | ICD-10-CM | POA: Diagnosis not present

## 2016-07-20 NOTE — Progress Notes (Addendum)
[name], [name] ([id number]) Visit Report for 07/19/2016 Abuse/Suicide Risk Screen Details [name] [name], [name] [name] Date of Service: 07/19/2016 2:15 PM [name] Patient Account Number: [id number] Medical Record Treating RN: Baruch Gouty RN, BSN, Velva Harman [id number] Number: Other Clinician: Date of Birth/Sex: Jul 04, 1931 (83 y.o. Female) Treating ROBSON, MICHAEL Primary Care [name] [name]: [name] [name] Bandon Sherwin/Extender: G Referring [name] [name]: [name] [name] Weeks in Treatment: 0 Abuse/Suicide Risk Screen Items Answer ABUSE/SUICIDE RISK SCREEN: Has anyone close to you tried to hurt or harm you recentlyo No Do you feel uncomfortable with anyone in your familyo No Has anyone forced you do things that you didnot want to doo No Do you have any thoughts of harming yourselfo No Patient displays signs or symptoms of abuse and/or neglect. No Electronic Signature(s) Signed: 07/26/2016 2:33:11 PM By: Gretta Cool, BSN, RN, CWS, Kim RN, BSN Signed: 08/03/2016 3:16:06 PM By: Regan Lemming BSN, RN Previous Signature: 07/19/2016 5:05:29 PM Version By: Regan Lemming BSN, RN Entered By: Gretta Cool, BSN, RN, CWS, Kim on 07/26/2016 14:33:11 [name] [name], [name] [name] ([id number]) -------------------------------------------------------------------------------- Activities of Daily Living Details [name] [name], [name] [name] Date of Service: 07/19/2016 2:15 PM [name] Patient Account Number: [id number] Medical Record Treating RN: Baruch Gouty RN, BSN, Velva Harman [id number] Number: Other Clinician: Date of Birth/Sex: [birth year] (83 y.o. Female) Treating ROBSON, MICHAEL Primary Care Vaniyah Lansky: [name] [name] [name] [name]/Extender: G Referring [name] [name]: [name] [name] Weeks in Treatment: 0 Activities of Daily Living Items Answer Activities of Daily Living (Please select one for each item) Drive Automobile Not Able Take Medications Need Assistance Use Telephone Need Assistance Care for Appearance Need Assistance Use Toilet Need Assistance Bath / Shower Need  Assistance Dress Self Need Assistance Feed Self Need Assistance Walk Need Assistance Get In / Out Bed Need Assistance Housework Need Assistance Prepare Meals Need Assistance Handle Money Need Assistance Shop for Self Need Assistance Electronic Signature(s) Signed: 07/26/2016 2:33:20 PM By: Gretta Cool, BSN, RN, CWS, Kim RN, BSN Signed: 08/03/2016 3:16:06 PM By: Regan Lemming BSN, RN Previous Signature: 07/19/2016 5:05:29 PM Version By: Regan Lemming BSN, RN Entered By: Gretta Cool, BSN, RN, CWS, Kim on 07/26/2016 14:33:19 [name] [name], [name] [name] ([id number]) -------------------------------------------------------------------------------- Education Assessment Details [name] [name], [name] [name] Date of Service: 07/19/2016 2:15 PM [name] Patient Account Number: [id number] Medical Record Treating RN: Baruch Gouty RN, BSN, Velva Harman [id number] Number: Other Clinician: Date of Birth/Sex: [birth year] (83 y.o. Female) Treating ROBSON, MICHAEL Primary Care [name] [name]: [name] [name] Ki Corbo/Extender: G Referring [name] [name]: [name] [name] Weeks in Treatment: 0 Primary Learner Assessed: Patient Learning Preferences/Education Level/Primary Language Learning Preference: Explanation Highest Education Level: High School Preferred Language: English Cognitive Barrier Assessment/Beliefs Language Barrier: No Physical Barrier Assessment Impaired Vision: No Impaired Hearing: No Decreased Hand dexterity: No Knowledge/Comprehension Assessment Knowledge Level: Medium Comprehension Level: Medium Ability to understand written Medium instructions: Ability to understand verbal Medium instructions: Motivation Assessment Anxiety Level: Calm Cooperation: Cooperative Education Importance: Acknowledges Need Interest in Health Problems: Asks Questions Perception: Coherent Willingness to Engage in Self- Medium Management Activities: Readiness to Engage in Self- Medium Management Activities: Electronic Signature(s) Signed: 07/26/2016  2:33:27 PM By: Gretta Cool, BSN, RN, CWS, Kim RN, BSN Signed: 08/03/2016 3:16:06 PM By: Regan Lemming BSN, RN Previous Signature: 07/19/2016 5:05:29 PM Version By: Regan Lemming BSN, RN [name] [name], [name] [name] ([id number]) Entered By: Gretta Cool, BSN, RN, CWS, Kim on 07/26/2016 14:33:27 [name] [name], [name] [name] ([id number]) -------------------------------------------------------------------------------- Fall Risk Assessment Details [name] [name], [name] [name] Date of Service: 07/19/2016 2:15 PM [name] Patient Account Number: [id number] Medical Record Treating RN: Baruch Gouty RN, BSN, Velva Harman [id number] Number: Other Clinician: Date of Birth/Sex: Apr 13, 1931 (83 y.o. Female) Treating  [name] [name] Primary Care [name] [name]: [name] [name] Amatullah Christy/Extender: G Referring [name] [name]: [name] [name] Weeks in Treatment: 0 Fall Risk Assessment Items Have you had 2 or more falls in the last 12 monthso 0 No Have you had any fall that resulted in injury in the last 12 monthso 0 No FALL RISK ASSESSMENT: History of falling - immediate or within 3 months 0 No Secondary diagnosis 0 No Ambulatory aid None/bed rest/wheelchair/nurse 0 Yes Crutches/cane/walker 15 Yes Furniture 0 No IV Access/Saline Lock 0 No Gait/Training Normal/bed rest/immobile 0 Yes Weak 0 No Impaired 20 Yes Mental Status Oriented to own ability 0 Yes Electronic Signature(s) Signed: 07/26/2016 2:33:36 PM By: Gretta Cool, BSN, RN, CWS, Kim RN, BSN Signed: 08/03/2016 3:16:06 PM By: Regan Lemming BSN, RN Previous Signature: 07/19/2016 5:05:29 PM Version By: Regan Lemming BSN, RN Entered By: Gretta Cool, BSN, RN, CWS, Kim on 07/26/2016 14:33:35 [name] [name], [name] [name] ([id number]) -------------------------------------------------------------------------------- Foot Assessment Details [name] [name], [name] [name] Date of Service: 07/19/2016 2:15 PM [name] Patient Account Number: [id number] Medical Record Treating RN: Baruch Gouty RN, BSN, Velva Harman [id number] Number: Other Clinician: Date  of Birth/Sex: [birth year] (83 y.o. Female) Treating ROBSON, MICHAEL Primary Care Aislee Landgren: [name] [name] Amaliya Whitelaw/Extender: G Referring Melinna Linarez: [name] [name] Weeks in Treatment: 0 Foot Assessment Items Site Locations + = Sensation present, - = Sensation absent, C = Callus, U = Ulcer R = Redness, W = Warmth, M = Maceration, PU = Pre-ulcerative lesion F = Fissure, S = Swelling, D = Dryness Assessment Right: Left: Other Deformity: No No Prior Foot Ulcer: No No Prior Amputation: No No Charcot Joint: No No Ambulatory Status: Ambulatory With Help Assistance Device: Wheelchair Gait: Administrator, arts) Signed: 07/26/2016 2:33:52 PM By: Gretta Cool, BSN, RN, CWS, Kim RN, BSN Signed: 08/03/2016 3:16:06 PM By: Regan Lemming BSN, RN Previous Signature: 07/19/2016 5:05:29 PM Version By: Regan Lemming BSN, RN [name] [name], [name] [name] ([id number]) Entered By: Gretta Cool, BSN, RN, CWS, Kim on 07/26/2016 14:33:51 [name] [name], [name] [name] ([id number]) -------------------------------------------------------------------------------- Nutrition Risk Assessment Details [name] [name], [name] [name] Date of Service: 07/19/2016 2:15 PM [name] Patient Account Number: [id number] Medical Record Treating RN: Baruch Gouty RN, BSN, Velva Harman [id number] Number: Other Clinician: Date of Birth/Sex: [birth year] (83 y.o. Female) Treating ROBSON, MICHAEL Primary Care Aydan Phoenix: [name] [name] Matthews Franks/Extender: G Referring Lizania Bouchard: [name] [name] Weeks in Treatment: 0 Height (in): 63 Weight (lbs): 203 Body Mass Index (BMI): 36 Nutrition Risk Assessment Items NUTRITION RISK SCREEN: I have an illness or condition that made me change the kind and/or 0 No amount of food I eat I eat fewer than two meals per day 0 No I eat few fruits and vegetables, or milk products 0 No I have three or more drinks of beer, liquor or wine almost every day 0 No I have tooth or mouth problems that make it hard for me to eat 0 No I don't always have enough money to  buy the food I need 0 No I eat alone most of the time 0 No I take three or more different prescribed or over-the-counter drugs a 0 No day Without wanting to, I have lost or gained 10 pounds in the last six 0 No months I am not always physically able to shop, cook and/or feed myself 0 No Nutrition Protocols Good Risk Protocol 0 No interventions needed Moderate Risk Protocol Electronic Signature(s) Signed: 07/26/2016 2:33:45 PM By: Gretta Cool, BSN, RN, CWS, Kim RN, BSN Signed: 08/03/2016 3:16:06 PM By: Regan Lemming BSN, RN Previous Signature: 07/19/2016 5:05:29 PM Version By: Regan Lemming BSN, RN Entered By: Gretta Cool, BSN,  RN, Marcy Siren on 07/26/2016 14:33:44

## 2016-07-20 NOTE — Progress Notes (Addendum)
[name] [name], [name] [name] ([id number]) Visit Report for 07/19/2016 Allergy List Details [name], Cordie Date of Service: 07/19/2016 2:15 PM [name] Patient Account Number: [id number] Medical Record Treating RN: Baruch Gouty RN, BSN, Velva Harman [id number] Number: Other Clinician: Date of Birth/Sex: 06-17-1931 (83 y.o. Female) Treating ROBSON, MICHAEL Primary Care Lavetta Geier: LAM, LYNN Harvest Stanco/Extender: G Referring Stellah Donovan: LAM, LYNN Weeks in Treatment: 0 Allergies Active Allergies No Known Allergies Allergy Notes Electronic Signature(s) Signed: 07/26/2016 2:32:49 PM By: Gretta Cool, BSN, RN, CWS, Kim RN, BSN Previous Signature: 07/19/2016 5:05:29 PM Version By: Regan Lemming BSN, RN Entered By: Gretta Cool, BSN, RN, CWS, Kim on 07/26/2016 14:32:48 [name], [name] ([id number]) -------------------------------------------------------------------------------- Arrival Information Details [name], [name] Date of Service: 07/19/2016 2:15 PM [name] Patient Account Number: [id number] Medical Record Treating RN: Baruch Gouty RN, BSN, Velva Harman [id number] Number: Other Clinician: Date of Birth/Sex: 1932-01-09 (83 y.o. Female) Treating ROBSON, MICHAEL Primary Care Lisandro Meggett: LAM, LYNN Corri Delapaz/Extender: G Referring Rosan Calbert: LAM, LYNN Weeks in Treatment: 0 Visit Information Patient Arrived: Wheel Chair Arrival Time: 14:26 Accompanied By: Rolley Sims Transfer Assistance: None Patient Identification Verified: Yes Secondary Verification Process Yes Completed: Patient Requires Transmission-Based No Precautions: Patient Has Alerts: No Electronic Signature(s) Signed: 07/26/2016 2:32:05 PM By: Gretta Cool, BSN, RN, CWS, Kim RN, BSN Previous Signature: 07/19/2016 5:05:29 PM Version By: Regan Lemming BSN, RN Entered By: Gretta Cool, BSN, RN, CWS, Kim on 07/26/2016 14:32:05 [name], [name] ([id number]) -------------------------------------------------------------------------------- Clinic Level of Care  Assessment Details [name], [name] [name] Date of Service: 07/19/2016 2:15 PM [name] Patient Account Number: [id number] Medical Record Treating RN: Baruch Gouty RN, BSN, Velva Harman [id number] Number: Other Clinician: Date of Birth/Sex: 1931/06/04 (83 y.o. Female) Treating ROBSON, MICHAEL Primary Care Jarion Hawthorne: LAM, LYNN Komal Stangelo/Extender: G Referring Kahlea Cobert: LAM, LYNN Weeks in Treatment: 0 Clinic Level of Care Assessment Items TOOL 1 Quantity Score []  - Use when EandM and Procedure is performed on INITIAL visit 0 ASSESSMENTS - Nursing Assessment / Reassessment X - General Physical Exam (combine w/ comprehensive assessment (listed just 1 20 below) when performed on new pt. evals) X - Comprehensive Assessment (HX, ROS, Risk Assessments, Wounds Hx, etc.) 1 25 ASSESSMENTS - Wound and Skin Assessment / Reassessment []  - Dermatologic / Skin Assessment (not related to wound area) 0 ASSESSMENTS - Ostomy and/or Continence Assessment and Care []  - Incontinence Assessment and Management 0 []  - Ostomy Care Assessment and Management (repouching, etc.) 0 PROCESS - Coordination of Care X - Simple Patient / Family Education for ongoing care 1 15 []  - Complex (extensive) Patient / Family Education for ongoing care 0 X - Staff obtains Programmer, systems, Records, Test Results / Process Orders 1 10 X - Staff telephones HHA, Nursing Homes / Clarify orders / etc 1 10 []  - Routine Transfer to another Facility (non-emergent condition) 0 []  - Routine Hospital Admission (non-emergent condition) 0 X - New Admissions / Biomedical engineer / Ordering NPWT, Apligraf, etc. 1 15 []  - Emergency Hospital Admission (emergent condition) 0 PROCESS - Special Needs []  - Pediatric / Minor Patient Management 0 [name], [name] ([id number]) []  - Isolation Patient Management 0 []  - Hearing / Language / Visual special needs 0 []  - Assessment of Community assistance (transportation, D/C planning, etc.) 0 []  - Additional  assistance / Altered mentation 0 []  - Support Surface(s) Assessment (bed, cushion, seat, etc.) 0 INTERVENTIONS - Miscellaneous []  - External ear exam 0 []  - Patient Transfer (multiple staff / Civil Service fast streamer / Similar devices) 0 []  - Simple Staple / Suture removal (25 or less) 0 []  - Complex  Staple / Suture removal (26 or more) 0 []  - Hypo/Hyperglycemic Management (do not check if billed separately) 0 X - Ankle / Brachial Index (ABI) - do not check if billed separately 1 15 Has the patient been seen at the hospital within the last three years: Yes Total Score: 110 Level Of Care: New/Established - Level 3 Electronic Signature(s) Signed: 07/19/2016 5:05:29 PM By: Regan Lemming BSN, RN Entered By: Regan Lemming on 07/19/2016 15:41:17 [name], [name] ([id number]) -------------------------------------------------------------------------------- Encounter Discharge Information Details [name], [name] Date of Service: 07/19/2016 2:15 PM [name] Patient Account Number: [id number] Medical Record Treating RN: Baruch Gouty RN, BSN, Velva Harman [id number] Number: Other Clinician: Date of Birth/Sex: 15-Jul-1931 (83 y.o. Female) Treating ROBSON, MICHAEL Primary Care Bradie Lacock: LAM, LYNN Ervey Fallin/Extender: G Referring Shirle Provencal: LAM, LYNN Weeks in Treatment: 0 Encounter Discharge Information Items Discharge Pain Level: 0 Discharge Condition: Stable Ambulatory Status: Wheelchair Discharge Destination: Home Transportation: Private Auto Accompanied By: dtr, son Schedule Follow-up Appointment: No Medication Reconciliation completed and provided to Patient/Care No Viraat Vanpatten: Provided on Clinical Summary of Care: 07/19/2016 Form Type Recipient Paper Patient JMS Electronic Signature(s) Signed: 07/19/2016 5:05:29 PM By: Regan Lemming BSN, RN Previous Signature: 07/19/2016 3:36:01 PM Version By: Ruthine Dose Entered By: Regan Lemming on 07/19/2016 15:42:14 [name], [name]  ([id number]) -------------------------------------------------------------------------------- Lower Extremity Assessment Details [name], Ammanda Date of Service: 07/19/2016 2:15 PM [name] Patient Account Number: [id number] Medical Record Treating RN: Baruch Gouty RN, BSN, Velva Harman [id number] Number: Other Clinician: Date of Birth/Sex: 1931-08-17 (83 y.o. Female) Treating ROBSON, MICHAEL Primary Care Oak Dorey: LAM, LYNN Leiby Pigeon/Extender: G Referring Starasia Sinko: LAM, LYNN Weeks in Treatment: 0 Edema Assessment Assessed: [Left: No] [Right: No] Edema: [Left: Yes] [Right: Yes] Calf Left: Right: Point of Measurement: 38 cm From Medial Instep 35 cm 36 cm Ankle Left: Right: Point of Measurement: 10 cm From Medial Instep 25 cm 25 cm Vascular Assessment Claudication: Claudication Assessment [Left:None] [Right:None] Pulses: Dorsalis Pedis Palpable: [Left:Yes] [Right:Yes] Posterior Tibial Palpable: [Left:Yes] [Right:Yes] Extremity colors, hair growth, and conditions: Extremity Color: [Left:Normal] [Right:Normal] Hair Growth on Extremity: [Left:No] [Right:No] Temperature of Extremity: [Left:Warm] [Right:Warm] Capillary Refill: [Left:< 3 seconds] [Right:< 3 seconds] Blood Pressure: Brachial: [Left:121] Dorsalis Pedis: 77 [Left:Dorsalis Pedis: 80] Ankle: Posterior Tibial: [Left:Posterior Tibial: 0.64] [Right:0.66] Toe Nail Assessment Left: Right: Thick: Yes Yes Discolored: Yes Yes [name], QUANDRA FEDORCHAK ([id number]) Deformed: Yes Yes Improper Length and Hygiene: Yes Yes Electronic Signature(s) Signed: 07/26/2016 2:32:37 PM By: Gretta Cool, BSN, RN, CWS, Kim RN, BSN Signed: 08/03/2016 3:16:06 PM By: Regan Lemming BSN, RN Previous Signature: 07/19/2016 5:05:29 PM Version By: Regan Lemming BSN, RN Entered By: Gretta Cool, BSN, RN, CWS, Kim on 07/26/2016 14:32:37 [name], [name] ([id number]) -------------------------------------------------------------------------------- Multi Wound Chart  Details [name], Hang Date of Service: 07/19/2016 2:15 PM [name] Patient Account Number: [id number] Medical Record Treating RN: Baruch Gouty RN, BSN, Velva Harman [id number] Number: Other Clinician: Date of Birth/Sex: [birth year] (83 y.o. Female) Treating ROBSON, MICHAEL Primary Care Felesia Stahlecker: LAM, LYNN Samira Acero/Extender: G Referring Tre Sanker: LAM, LYNN Weeks in Treatment: 0 Vital Signs Height(in): 63 Pulse(bpm): 63 Weight(lbs): 203 Blood Pressure 121/38 (mmHg): Body Mass Index(BMI): 36 Temperature(F): 98.1 Respiratory Rate 17 (breaths/min): Photos: [1:No Photos] [N/A:N/A] Wound Location: [1:Right Calcaneus] [N/A:N/A] Wounding Event: [1:Gradually Appeared] [N/A:N/A] Primary Etiology: [1:Diabetic Wound/Ulcer of N/A the Lower Extremity] Comorbid History: [1:Cataracts, Arrhythmia, Coronary Artery Disease, Hypertension, Type II Diabetes, History of pressure wounds, Osteoarthritis, Neuropathy] [N/A:N/A] Date Acquired: [1:06/13/2016] [N/A:N/A] Weeks of Treatment: [1:0] [N/A:N/A] Wound Status: [1:Open] [N/A:N/A] Measurements L x W x D 2.3x2x0.1 [N/A:N/A] (cm) Area (cm) : [  1:3.613] [N/A:N/A] Volume (cm) : [1:0.361] [N/A:N/A] Classification: [1:Grade 1] [N/A:N/A] Exudate Amount: [1:Medium] [N/A:N/A] Exudate Type: [1:Serosanguineous] [N/A:N/A] Exudate Color: [1:red, brown] [N/A:N/A] Wound Margin: [1:Distinct, outline attached N/A] Granulation Amount: [1:Small (1-33%)] [N/A:N/A] Granulation Quality: [1:Pale] [N/A:N/A] Necrotic Amount: [1:Large (67-100%)] [N/A:N/A] Necrotic Tissue: [1:Eschar, Adherent Slough N/A] Exposed Structures: [N/A:N/A] Fat Layer (Subcutaneous Tissue) Exposed: Yes Fascia: No Tendon: No Muscle: No Joint: No Bone: No Epithelialization: None N/A N/A Debridement: Debridement (39767- N/A N/A 11047) Pre-procedure 15:16 N/A N/A Verification/Time Out Taken: Pain Control: Lidocaine 4% Topical N/A N/A Solution Tissue Debrided: Necrotic/Eschar, N/A  N/A Fibrin/Slough, Fat, Exudates, Subcutaneous Level: Skin/Subcutaneous N/A N/A Tissue Debridement Area (sq 4.6 N/A N/A cm): Instrument: Curette, Scissors N/A N/A Bleeding: Minimum N/A N/A Hemostasis Achieved: Pressure N/A N/A Procedural Pain: 0 N/A N/A Post Procedural Pain: 0 N/A N/A Debridement Treatment Procedure was tolerated N/A N/A Response: well Post Debridement 2.3x2x0.1 N/A N/A Measurements L x W x D (cm) Post Debridement 0.361 N/A N/A Volume: (cm) Periwound Skin Texture: Excoriation: No N/A N/A Induration: No Callus: No Crepitus: No Rash: No Scarring: No Periwound Skin Maceration: No N/A N/A Moisture: Dry/Scaly: No Periwound Skin Color: Atrophie Blanche: No N/A N/A Cyanosis: No Ecchymosis: No Erythema: No Hemosiderin Staining: No Mottled: No Pallor: No Rubor: No [name], Tresia C. ([id number]) Temperature: No Abnormality N/A N/A Tenderness on No N/A N/A Palpation: Wound Preparation: Ulcer Cleansing: N/A N/A Rinsed/Irrigated with Saline Topical Anesthetic Applied: Other: lidocaine 4% Procedures Performed: Debridement N/A N/A Treatment Notes Wound #1 (Right Calcaneus) 1. Cleansed with: Clean wound with Normal Saline 4. Dressing Applied: Aquacel Ag 5. Secondary Dressing Applied Gauze and Kerlix/Conform 7. Secured with Recruitment consultant) Signed: 07/19/2016 5:26:18 PM By: Linton Ham MD Entered By: Linton Ham on 07/19/2016 16:12:24 [name], [name] ([id number]) -------------------------------------------------------------------------------- Multi-Disciplinary Care Plan Details [name], Romie Minus Date of Service: 07/19/2016 2:15 PM [name] Patient Account Number: [id number] Medical Record Treating RN: Baruch Gouty RN, BSN, Velva Harman [id number] Number: Other Clinician: Date of Birth/Sex: Jun 19, 1931 (83 y.o. Female) Treating ROBSON, MICHAEL Primary Care Victoria Henshaw: LAM, LYNN Clebert Wenger/Extender: G Referring Agnes Brightbill:  LAM, LYNN Weeks in Treatment: 0 Active Inactive ` Orientation to the Wound Care Program Nursing Diagnoses: Knowledge deficit related to the wound healing center program Goals: Patient/caregiver will verbalize understanding of the [place] Date Initiated: 08/02/2016 Target Resolution Date: 10/19/2016 Goal Status: Active Interventions: Provide education on orientation to the wound center Notes: ` Pressure Nursing Diagnoses: Knowledge deficit related to causes and risk factors for pressure ulcer development Knowledge deficit related to management of pressures ulcers Potential for impaired tissue integrity related to pressure, friction, moisture, and shear Goals: Patient will remain free from development of additional pressure ulcers Date Initiated: 08/02/2016 Target Resolution Date: 10/19/2016 Goal Status: Active Patient will remain free of pressure ulcers Date Initiated: 08/02/2016 Target Resolution Date: 10/19/2016 Goal Status: Active Patient/caregiver will verbalize risk factors for pressure ulcer development Date Initiated: 08/02/2016 Target Resolution Date: 10/19/2016 Goal Status: Active Patient/caregiver will verbalize understanding of pressure ulcer management WILLOW, SHIDLER ([id number]) Date Initiated: 08/02/2016 Target Resolution Date: 10/19/2016 Goal Status: Active Interventions: Assess: immobility, friction, shearing, incontinence upon admission and as needed Assess offloading mechanisms upon admission and as needed Assess potential for pressure ulcer upon admission and as needed Provide education on pressure ulcers Treatment Activities: Patient referred for home evaluation of offloading devices/mattresses : 07/19/2016 Patient referred for pressure reduction/relief devices : 07/19/2016 Patient referred for seating evaluation to ensure proper offloading : 07/19/2016 Pressure reduction/relief device ordered : 07/19/2016 Notes: `  Wound/Skin  Impairment Nursing Diagnoses: Impaired tissue integrity Knowledge deficit related to ulceration/compromised skin integrity Goals: Patient/caregiver will verbalize understanding of skin care regimen Date Initiated: 08/02/2016 Target Resolution Date: 10/22/2016 Goal Status: Active Ulcer/skin breakdown will have a volume reduction of 30% by week 4 Date Initiated: 08/02/2016 Target Resolution Date: 10/22/2016 Goal Status: Active Ulcer/skin breakdown will have a volume reduction of 50% by week 8 Date Initiated: 08/02/2016 Target Resolution Date: 10/22/2016 Goal Status: Active Ulcer/skin breakdown will have a volume reduction of 80% by week 12 Date Initiated: 08/02/2016 Target Resolution Date: 10/22/2016 Goal Status: Active Ulcer/skin breakdown will heal within 14 weeks Date Initiated: 08/02/2016 Target Resolution Date: 10/22/2016 Goal Status: Active Interventions: Assess patient/caregiver ability to obtain necessary supplies Assess patient/caregiver ability to perform ulcer/skin care regimen upon admission and as needed Assess ulceration(s) every visit [name], JAYSIE BENTHALL ([id number]) Provide education on ulcer and skin care Treatment Activities: Refer to smoking cessation program : 07/19/2016 Skin care regimen initiated : 07/19/2016 Topical wound management initiated : 07/19/2016 Notes: Electronic Signature(s) Signed: 08/02/2016 2:41:07 PM By: Regan Lemming BSN, RN Entered By: Regan Lemming on 08/02/2016 14:41:06 [name], [name] ([id number]) -------------------------------------------------------------------------------- Pain Assessment Details [name], Byanka Date of Service: 07/19/2016 2:15 PM [name] Patient Account Number: [id number] Medical Record Treating RN: Baruch Gouty RN, BSN, Velva Harman [id number] Number: Other Clinician: Date of Birth/Sex: 22-Oct-1931 (83 y.o. Female) Treating ROBSON, MICHAEL Primary Care Ceilidh Torregrossa: LAM, LYNN Sye Schroepfer/Extender: G Referring Makahla Kiser:  LAM, LYNN Weeks in Treatment: 0 Active Problems Location of Pain Severity and Description of Pain Patient Has Paino Yes Site Locations Pain Location: Pain in Ulcers With Dressing Change: Yes Rate the pain. Current Pain Level: 4 Character of Pain Describe the Pain: Tender Pain Management and Medication Current Pain Management: Electronic Signature(s) Signed: 07/26/2016 2:32:17 PM By: Gretta Cool, BSN, RN, CWS, Kim RN, BSN Signed: 08/03/2016 3:16:06 PM By: Regan Lemming BSN, RN Previous Signature: 07/19/2016 5:05:29 PM Version By: Regan Lemming BSN, RN Entered By: Gretta Cool, BSN, RN, CWS, Kim on 07/26/2016 14:32:16 [name], [name] ([id number]) -------------------------------------------------------------------------------- Patient/Caregiver Education Details [name], Romie Minus Date of Service: 07/19/2016 2:15 PM [name] Patient Account Number: [id number] Medical Record Treating RN: Baruch Gouty RN, BSN, Velva Harman [id number] Number: Other Clinician: Date of Birth/Gender: 11/06/1931 (83 y.o. Female) Treating ROBSON, MICHAEL Primary Care Physician/Extender: Autumn Messing Physician: Suella [place] in Treatment: 0 Referring Physician: LAM, LYNN Education Assessment Education Provided To: Patient and Caregiver Education Topics Provided Basic Hygiene: Methods: Explain/Verbal Responses: State content correctly Elevated Blood Sugar/ Impact on Healing: Methods: Explain/Verbal Responses: Reinforcements needed, State content correctly Offloading: Methods: Explain/Verbal Responses: Reinforcements needed, State content correctly Peripheral Neuropathy: Methods: Explain/Verbal Responses: Reinforcements needed, State content correctly Pressure: Methods: Explain/Verbal Responses: Reinforcements needed, State content correctly Wound Debridement: Methods: Explain/Verbal Responses: Reinforcements needed, State content correctly Wound/Skin Impairment: Methods: Explain/Verbal Responses: Reinforcements  needed, State content correctly Electronic Signature(s) Signed: 07/19/2016 5:05:29 PM By: Regan Lemming BSN, RN [name], [name] ([id number]) Entered By: Regan Lemming on 07/19/2016 15:43:14 [name], [name] ([id number]) -------------------------------------------------------------------------------- Wound Assessment Details [name], Larisha Date of Service: 07/19/2016 2:15 PM [name] Patient Account Number: [id number] Medical Record Baruch Gouty, RN, BSN, [id number] Treating RN: Number: Velva Harman Date of Birth/Sex: Jun 24, 1931 (83 y.o. Female) Other Clinician: Primary Care Sharbel Sahagun: LAM, LYNN Treating Britto, Errol Referring Kalina Morabito: LAM, LYNN Zarra Geffert/Extender: Weeks in Treatment: 0 Wound Status Wound Number: 1 Primary Diabetic Wound/Ulcer of the Lower Etiology: Extremity Wound Location: Right Calcaneus Wound Open Wounding Event: Gradually Appeared Status: Date Acquired: 06/13/2016 Comorbid Cataracts, Arrhythmia, Coronary Artery Weeks  Of Treatment: 0 History: Disease, Hypertension, Type II Clustered Wound: No Diabetes, History of pressure wounds, Osteoarthritis, Neuropathy Photos Photo Uploaded By: Regan Lemming on 07/19/2016 17:22:03 Wound Measurements Length: (cm) 2.3 Width: (cm) 2 Depth: (cm) 0.1 Area: (cm) 3.613 Volume: (cm) 0.361 % Reduction in Area: % Reduction in Volume: Epithelialization: None Tunneling: No Undermining: No Wound Description Classification: Grade 1 Wound Margin: Distinct, outline attached Exudate Amount: Medium Exudate Type: Serosanguineous Exudate Color: red, brown Foul Odor After Cleansing: No Slough/Fibrino Yes Wound Bed Granulation Amount: Small (1-33%) Exposed Structure [name], Zeriyah C. ([id number]) Granulation Quality: Pale Fascia Exposed: No Necrotic Amount: Large (67-100%) Fat Layer (Subcutaneous Tissue) Exposed: Yes Necrotic Quality: Eschar, Adherent Slough Tendon Exposed: No Muscle Exposed: No Joint  Exposed: No Bone Exposed: No Periwound Skin Texture Texture Color No Abnormalities Noted: No No Abnormalities Noted: No Callus: No Atrophie Blanche: No Crepitus: No Cyanosis: No Excoriation: No Ecchymosis: No Induration: No Erythema: No Rash: No Hemosiderin Staining: No Scarring: No Mottled: No Pallor: No Moisture Rubor: No No Abnormalities Noted: No Dry / Scaly: No Temperature / Pain Maceration: No Temperature: No Abnormality Wound Preparation Ulcer Cleansing: Rinsed/Irrigated with Saline Topical Anesthetic Applied: Other: lidocaine 4%, Electronic Signature(s) Signed: 07/19/2016 5:05:29 PM By: Regan Lemming BSN, RN Entered By: Regan Lemming on 07/19/2016 14:52:24 [name], [name] ([id number]) -------------------------------------------------------------------------------- Vitals Details [name], Gemma Date of Service: 07/19/2016 2:15 PM [name] Patient Account Number: [id number] Medical Record Treating RN: Baruch Gouty RN, BSN, Velva Harman [id number] Number: Other Clinician: Date of Birth/Sex: October 01, 1931 (83 y.o. Female) Treating ROBSON, MICHAEL Primary Care Javad Salva: LAM, LYNN Rockell Faulks/Extender: G Referring Emil Weigold: LAM, LYNN Weeks in Treatment: 0 Vital Signs Time Taken: 14:37 Temperature (F): 98.1 Height (in): 63 Pulse (bpm): 63 Source: Stated Respiratory Rate (breaths/min): 17 Weight (lbs): 203 Blood Pressure (mmHg): 121/38 Source: Stated Reference Range: 80 - 120 mg / dl Body Mass Index (BMI): 36 Electronic Signature(s) Signed: 07/26/2016 2:32:25 PM By: Gretta Cool, BSN, RN, CWS, Kim RN, BSN Previous Signature: 07/19/2016 5:05:29 PM Version By: Regan Lemming BSN, RN Entered By: Gretta Cool, BSN, RN, CWS, Kim on 07/26/2016 14:32:25

## 2016-07-21 ENCOUNTER — Other Ambulatory Visit: Payer: Self-pay | Admitting: Internal Medicine

## 2016-07-21 ENCOUNTER — Ambulatory Visit: Payer: Commercial Managed Care - HMO | Admitting: Surgery

## 2016-07-21 DIAGNOSIS — I5042 Chronic combined systolic (congestive) and diastolic (congestive) heart failure: Secondary | ICD-10-CM | POA: Diagnosis not present

## 2016-07-21 DIAGNOSIS — E1122 Type 2 diabetes mellitus with diabetic chronic kidney disease: Secondary | ICD-10-CM | POA: Diagnosis not present

## 2016-07-21 DIAGNOSIS — I69398 Other sequelae of cerebral infarction: Secondary | ICD-10-CM | POA: Diagnosis not present

## 2016-07-21 DIAGNOSIS — S81801S Unspecified open wound, right lower leg, sequela: Secondary | ICD-10-CM

## 2016-07-21 DIAGNOSIS — L89612 Pressure ulcer of right heel, stage 2: Secondary | ICD-10-CM | POA: Diagnosis not present

## 2016-07-21 DIAGNOSIS — N183 Chronic kidney disease, stage 3 (moderate): Secondary | ICD-10-CM | POA: Diagnosis not present

## 2016-07-21 DIAGNOSIS — S91114D Laceration without foreign body of right lesser toe(s) without damage to nail, subsequent encounter: Secondary | ICD-10-CM | POA: Diagnosis not present

## 2016-07-21 DIAGNOSIS — E1151 Type 2 diabetes mellitus with diabetic peripheral angiopathy without gangrene: Secondary | ICD-10-CM | POA: Diagnosis not present

## 2016-07-21 DIAGNOSIS — M6281 Muscle weakness (generalized): Secondary | ICD-10-CM | POA: Diagnosis not present

## 2016-07-21 DIAGNOSIS — I110 Hypertensive heart disease with heart failure: Secondary | ICD-10-CM | POA: Diagnosis not present

## 2016-07-21 NOTE — Progress Notes (Addendum)
SHELL, BLANCHETTE ([id number]) Visit Report for 07/19/2016 Chief Complaint Document Details [name], Raffaela Date of Service: 07/19/2016 2:15 PM [name] Patient Account Number: [id number] Medical Record Treating RN: Cornell Barman [id number] Number: Other Clinician: Date of Birth/Sex: 06/30/1931 (83 y.o. Female) Treating Tanecia Mccay Primary Care Provider: LAM, LYNN Provider/Extender: G Referring Provider: LAM, LYNN Weeks in Treatment: 0 Information Obtained from: Patient Chief Complaint 07/19/16; patient is here for review of a wound on the right heel Electronic Signature(s) Signed: 07/19/2016 5:26:18 PM By: Linton Ham MD Entered By: Linton Ham on 07/19/2016 16:15:09 [name], [name] ([id number]) -------------------------------------------------------------------------------- Debridement Details [name], Tyechia Date of Service: 07/19/2016 2:15 PM [name] Patient Account Number: [id number] Medical Record Treating RN: Cornell Barman [id number] Number: Other Clinician: Date of Birth/Sex: January 08, 1932 (83 y.o. Female) Treating Winta Barcelo Primary Care Provider: LAM, LYNN Provider/Extender: G Referring Provider: LAM, LYNN Weeks in Treatment: 0 Debridement Performed for Wound #1 Right Calcaneus Assessment: Performed By: Physician Ricard Dillon, MD Debridement: Debridement Severity of Tissue Pre Fat layer exposed Debridement: Pre-procedure Verification/Time Out Yes - 15:16 Taken: Start Time: 15:16 Pain Control: Lidocaine 4% Topical Solution Level: Skin/Subcutaneous Tissue Total Area Debrided (L x 2.3 (cm) x 2 (cm) = 4.6 (cm) W): Tissue and other Non-Viable, Eschar, Exudate, Fat, Fibrin/Slough, Subcutaneous material debrided: Instrument: Curette, Scissors Bleeding: Minimum Hemostasis Achieved: Pressure End Time: 15:19 Procedural Pain: 0 Post Procedural Pain: 0 Response to Treatment: Procedure was tolerated well Post  Debridement Measurements of Total Wound Length: (cm) 2.3 Width: (cm) 2 Depth: (cm) 0.1 Volume: (cm) 0.361 Character of Wound/Ulcer Post Requires Further Debridement Debridement: Severity of Tissue Post Debridement: Fat layer exposed Post Procedure Diagnosis Same as Pre-procedure Electronic Signature(s) ANJOLIE, MAJER ([id number]) Signed: 07/19/2016 5:26:18 PM By: Linton Ham MD Signed: 07/20/2016 4:31:10 PM By: Gretta Cool, BSN, RN, CWS, Kim RN, BSN Entered By: Linton Ham on 07/19/2016 16:14:31 [name], [name] ([id number]) -------------------------------------------------------------------------------- HPI Details [name], Geanna Date of Service: 07/19/2016 2:15 PM [name] Patient Account Number: [id number] Medical Record Treating RN: Cornell Barman [id number] Number: Other Clinician: Date of Birth/Sex: [birth year] (83 y.o. Female) Treating Shonnie Poudrier Primary Care Provider: LAM, LYNN Provider/Extender: G Referring Provider: LAM, LYNN Weeks in Treatment: 0 History of Present Illness HPI Description: 07/19/16; this is an 83 year old woman who tells me that she has had a wound on her right heel for about a month and a half. She said she was hospitalized with postmenopausal vaginal bleeding and was hospitalized ultimately at [facility] for this. I do not have these records. Afterward she was sent to [place] for rehabilitation. She states there she developed a wound on her foot. The patient is a type II diabetic on insulin, I'm not really sure of her diabetic control. She has well care or home health we think they are putting Silvadene cream on this. ABIs in our clinic were 0.6 bilaterally. The patient and her husband it was somewhat vague history of possible stent placement in the right thigh 2-3 years ago although I don't see anything about this and [place] link. We will need to research this more before we see her again next week. She did  have a normal x-ray of the heel at the end of May I believe when she was seen in the ER. There was no evidence of osteomyelitis although soft tissue edema was noted. Looking through care everywhere and [place] link I can see no reference to vascular surgery, noninvasive vascular tests. It is possible that this  was non-at the local vein and vascular Center before they became part of the network. This would be Dr. Lucky Cowboy and Dr. Delana Meyer we will check with their office Electronic Signature(s) Signed: 07/19/2016 5:26:18 PM By: Linton Ham MD Entered By: Linton Ham on 07/19/2016 16:23:30 [name], [name] ([id number]) -------------------------------------------------------------------------------- Physical Exam Details [name], Arlo Date of Service: 07/19/2016 2:15 PM [name] Patient Account Number: [id number] Medical Record Treating RN: Cornell Barman [id number] Number: Other Clinician: Date of Birth/Sex: 12/24/1931 (83 y.o. Female) Treating Helton Oleson Primary Care Provider: LAM, LYNN Provider/Extender: G Referring Provider: LAM, LYNN Weeks in Treatment: 0 Constitutional Sitting or standing Blood Pressure is within target range for patient.. Pulse regular and within target range for patient.[name] Respirations regular, non-labored and within target range.. Temperature is normal and within the target range for the patient.[name] appears in no distress. Eyes Conjunctivae clear. No discharge. Ears, Nose, Mouth, and Throat Upper and lower dentures no lesions. Respiratory Respiratory effort is easy and symmetric bilaterally. Rate is normal at rest and on room air.. Bilateral breath sounds are clear and equal in all lobes with no wheezes, rales or rhonchi.. Cardiovascular Heart rhythm and rate regular, without murmur or gallop.. Femoral arteries without bruits and pulses strong. Popliteal pulses were strong. Very faint dorsalis pedis pulse and posterior tibial. Changes of  chronic lower extremity venous insufficiency but no edema. Lymphatic None palpable in the popliteal or inguinal area. Integumentary (Hair, Skin) No rash was seen. Neurological Marked decreased vibration sense and light touch in her feet. She even has reduced vibration sense in her hands. Psychiatric No evidence of depression, anxiety, or agitation. Calm, cooperative, and communicative. Appropriate interactions and affect.. Notes Wound exam; the patient has a wound at the tip of her right heel. This might have been a larger wound at one point with healing/epithelialization of part of it. However the remaining wound which is lateral is covered in an adherent necrotic eschar. Using #3 curet I remove the necrotic surface gently. Hemostasis with direct pressure. There is no evidence of surrounding infection Electronic Signature(s) Signed: 07/19/2016 5:26:18 PM By: Linton Ham MD Entered By: Linton Ham on 07/19/2016 16:27:05 [name], [name] ([id number]) [name], [name] ([id number]) -------------------------------------------------------------------------------- Physician Orders Details [name], Tashyra Date of Service: 07/19/2016 2:15 PM [name] Patient Account Number: [id number] Medical Record Treating RN: Baruch Gouty RN, BSN, Velva Harman [id number] Number: Other Clinician: Date of Birth/Sex: 1931-03-11 (83 y.o. Female) Treating Ritisha Deitrick Primary Care Provider: LAM, LYNN Provider/Extender: G Referring Provider: LAM, LYNN Weeks in Treatment: 0 Verbal / Phone Orders: No Diagnosis Coding Wound Cleansing Wound #1 Right Calcaneus o Cleanse wound with mild soap and water o May Shower, gently pat wound dry prior to applying new dressing. Anesthetic Wound #1 Right Calcaneus o Topical Lidocaine 4% cream applied to wound bed prior to debridement - in clinic Skin Barriers/Peri-Wound Care Wound #1 Right Calcaneus o Barrier cream Primary Wound  Dressing Wound #1 Right Calcaneus o Aquacel Ag Secondary Dressing Wound #1 Right Calcaneus o Gauze and Kerlix/Conform o Other - heel pads Dressing Change Frequency Wound #1 Right Calcaneus o Change Dressing Monday, Wednesday, Friday - she comes to the wound clinic wednesdays Follow-up Appointments Wound #1 Right Calcaneus o Return Appointment in 1 week. Edema Control Wound #1 Right Calcaneus o Elevate legs to the level of the heart and pump ankles as often as possible [name], Louine C. ([id number]) Off-Loading Wound #1 Right Calcaneus o Other: - elevated legs pillows Additional Orders / Instructions Wound #1 Right Calcaneus   o Increase protein intake. Home Health Wound #1 Right [place] Visits - [place] Nurse may visit PRN to address patientos wound care needs. o FACE TO FACE ENCOUNTER: MEDICARE and MEDICAID PATIENTS: I certify that this patient is under my care and that I had a face-to-face encounter that meets the physician face-to-face encounter requirements with this patient on this date. The encounter with the patient was in whole or in part for the following MEDICAL CONDITION: (primary reason for [place]) MEDICAL NECESSITY: I certify, that based on my findings, NURSING services are a medically necessary home health service. HOME BOUND STATUS: I certify that my clinical findings support that this patient is homebound (i.e., Due to illness or injury, pt requires aid of supportive devices such as crutches, cane, wheelchairs, walkers, the use of special transportation or the assistance of another person to leave their place of residence. There is a normal inability to leave the home and doing so requires considerable and taxing effort. Other absences are for medical reasons / religious services and are infrequent or of short duration when for other reasons). o If current dressing causes regression  in wound condition, may D/C ordered dressing product/s and apply Normal Saline Moist Dressing daily until next [place] / Other MD appointment. [place] of regression in wound condition at [phone]. o Please direct any NON-WOUND related issues/requests for orders to patient's Primary Care Physician Services and Therapies o Arterial Studies- Bilateral - wound on right heel, dopplers signals irregular, abi <0.7 Electronic Signature(s) Signed: 07/19/2016 5:05:29 PM By: Regan Lemming BSN, RN Signed: 07/19/2016 5:26:18 PM By: Linton Ham MD Entered By: Regan Lemming on 07/19/2016 15:27:19 [name], [name] ([id number]) -------------------------------------------------------------------------------- Problem List Details [name], Reni Date of Service: 07/19/2016 2:15 PM [name] Patient Account Number: [id number] Medical Record Treating RN: Cornell Barman [id number] Number: Other Clinician: Date of Birth/Sex: 14-Nov-1931 (83 y.o. 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 Patient Account Number: [id number] Medical Record Treating RN: Cornell Barman [id number] Number: Other Clinician: Date of Birth/Sex: [birth year] (83 y.o. Female) Treating Lenville Hibberd Primary Care Provider: LAM, Jeani Hawking Provider/Extender: G Referring Provider: LAM, LYNN Weeks in Treatment: 0 Subjective Chief Complaint Information obtained from Patient 07/19/16; patient is here for review of a wound on the right heel History of Present Illness (HPI) 07/19/16; this is an 83 year old woman who tells me that she has had a wound on her right heel for about a month and a half. She said she was hospitalized with postmenopausal vaginal bleeding and was hospitalized ultimately at [facility] for this. I do not have these records. Afterward she was sent to [place] for rehabilitation. She states there she developed a wound on her foot. The patient is a type II diabetic on insulin, I'm not really sure of her diabetic control.  She has well care or home health we think they are putting Silvadene cream on this. ABIs in our clinic were 0.6 bilaterally. The patient and her husband it was somewhat vague history of possible stent placement in the right thigh 2-3 years ago although I don't see anything about this and [place] link. We will need to research this more before we see her again next week. She did have a normal x-ray of the heel at the end of May I believe when she was seen in the ER. There was no evidence of osteomyelitis although soft tissue edema was noted. Looking through care everywhere and [place] link I can see no reference to vascular surgery, noninvasive vascular tests. It is possible that this was non-at the local vein and vascular Center before they became part of the network. This would be Dr. Lucky Cowboy and Dr. Delana Meyer we will check with their office Wound History Patient presents with 1 open wound that has been present for approximately 2 month. Patient has  been treating wound in the following manner: dryn ssd. Laboratory tests have not been performed in the last month. Patient reportedly has not tested positive for an antibiotic resistant organism. Patient reportedly has not tested positive for osteomyelitis. Patient reportedly has not had testing performed to evaluate circulation in the legs. Patient experiences the following problems associated with their wounds: infection, swelling. Patient History Information obtained from Patient, Caregiver, Chart. Allergies No Known Allergies [name], RANATA LAUGHERY. ([id number]) Family History Cancer - Siblings, Diabetes - Siblings, Hypertension - Mother, Stroke - Siblings, Mother, No family history of Heart Disease, Hereditary Spherocytosis, Kidney Disease, Lung Disease, Seizures, Thyroid Problems, Tuberculosis. Social History Never smoker, Marital Status - Married, Alcohol Use - Never, Drug Use - No History, Caffeine Use - Moderate. Medical History Eyes Patient has history of Cataracts - re moved Ear/Nose/Mouth/Throat Denies history of Chronic sinus problems/congestion, Middle ear problems Hematologic/Lymphatic Denies history of Anemia, Hemophilia, Human Immunodeficiency Virus, Lymphedema, Sickle Cell Disease Respiratory Denies history of Aspiration, Asthma, Chronic Obstructive Pulmonary Disease (COPD), Pneumothorax, Sleep Apnea, Tuberculosis Cardiovascular Patient has history of Arrhythmia, Coronary Artery Disease, Hypertension Gastrointestinal Denies history of Cirrhosis , Colitis, Crohn s, Hepatitis A, Hepatitis B, Hepatitis C Endocrine Patient has history of Type II Diabetes Immunological Denies history of Lupus Erythematosus, Raynaud s, Scleroderma Integumentary (Skin) Patient has history of History of pressure wounds Denies history of History of Burn Musculoskeletal Patient has history of Osteoarthritis Neurologic Patient has history of Neuropathy - Diabetic Denies history of  Quadriplegia, Paraplegia, Seizure Disorder Oncologic Denies history of Received Chemotherapy, Received Radiation Psychiatric Denies history of Anorexia/bulimia, Confinement Anxiety Patient is treated with Insulin, Oral Agents. Blood sugar is not tested. Blood sugar results noted at the following times: Bedtime - 352. Medical And Surgical History Notes Gastrointestinal GERD Genitourinary stage 3 kidney disease Oncologic early stage of cancer [name], Samoria C. ([id number]) Review of Systems (ROS) Constitutional Symptoms (General Health) The patient has no complaints or symptoms. Eyes Complains or has symptoms of Glasses / Contacts. Ear/Nose/Mouth/Throat The patient has no complaints or symptoms. Hematologic/Lymphatic The patient has no complaints or symptoms. Respiratory The patient has no complaints or symptoms. Cardiovascular Complains or has symptoms of LE edema. Gastrointestinal The patient has no complaints or symptoms. Immunological The patient has no complaints or symptoms. Integumentary (Skin) Complains or has symptoms of Wounds, Breakdown, Swelling. Musculoskeletal Complains or has symptoms of Muscle Weakness. Neurologic Complains or has symptoms of Numbness/parasthesias. Oncologic The patient has no complaints or symptoms. Psychiatric  The patient has no complaints or symptoms. Objective Constitutional Sitting or standing Blood Pressure is within target range for patient.. Pulse regular and within target range for patient.[name] Respirations regular, non-labored and within target range.. Temperature is normal and within the target range for the patient.[name] appears in no distress. Vitals Time Taken: 2:37 PM, Height: 63 in, Source: Stated, Weight: 203 lbs, Source: Stated, BMI: 36, Temperature: 98.1 F, Pulse: 63 bpm, Respiratory Rate: 17 breaths/min, Blood Pressure: 121/38 mmHg. Eyes Conjunctivae clear. No discharge. Ears, Nose, Mouth, and Throat Upper and  lower dentures no lesions. [name], TOREY REGAN ([id number]) Respiratory Respiratory effort is easy and symmetric bilaterally. Rate is normal at rest and on room air.. Bilateral breath sounds are clear and equal in all lobes with no wheezes, rales or rhonchi.. Cardiovascular Heart rhythm and rate regular, without murmur or gallop.. Femoral arteries without bruits and pulses strong. Popliteal pulses were strong. Very faint dorsalis pedis pulse and posterior tibial. Changes of chronic lower extremity venous insufficiency but no edema. Lymphatic None palpable in the popliteal or inguinal area. Neurological Marked decreased vibration sense and light touch in her feet. She even has reduced vibration sense in her hands. Psychiatric No evidence of depression, anxiety, or agitation. Calm, cooperative, and communicative. Appropriate interactions and affect.. General Notes: Wound exam; the patient has a wound at the tip of her right heel. This might have been a larger wound at one point with healing/epithelialization of part of it. However the remaining wound which is lateral is covered in an adherent necrotic eschar. Using #3 curet I remove the necrotic surface gently. Hemostasis with direct pressure. There is no evidence of surrounding infection Integumentary (Hair, Skin) No rash was seen. Wound #1 status is Open. Original cause of wound was Gradually Appeared. The wound is located on the Right Calcaneus. The wound measures 2.3cm length x 2cm width x 0.1cm depth; 3.613cm^2 area and 0.361cm^3 volume. There is Fat Layer (Subcutaneous Tissue) Exposed exposed. There is no tunneling or undermining noted. There is a medium amount of serosanguineous drainage noted. The wound margin is distinct with the outline attached to the wound base. There is small (1-33%) pale granulation within the wound bed. There is a large (67-100%) amount of necrotic tissue within the wound bed including Eschar and  Adherent Slough. The periwound skin appearance did not exhibit: Callus, Crepitus, Excoriation, Induration, Rash, Scarring, Dry/Scaly, Maceration, Atrophie Blanche, Cyanosis, Ecchymosis, Hemosiderin Staining, Mottled, Pallor, Rubor, Erythema. Periwound temperature was noted as No Abnormality. Assessment Active Problems ICD-10 E11.621 - Type 2 diabetes mellitus with foot ulcer L97.411 - Non-pressure chronic ulcer of right heel and midfoot limited to breakdown of skin [name], Delesia C. ([id number]) E11.51 - Type 2 diabetes mellitus with diabetic peripheral angiopathy without gangrene E11.42 - Type 2 diabetes mellitus with diabetic polyneuropathy Procedures Wound #1 Pre-procedure diagnosis of Wound #1 is a Diabetic Wound/Ulcer of the Lower Extremity located on the Right Calcaneus .Severity of Tissue Pre Debridement is: Fat layer exposed. There was a Skin/Subcutaneous Tissue Debridement ([id number]) debridement with total area of 4.6 sq cm performed by Ricard Dillon, MD. with the following instrument(s): Curette and Scissors to remove Non- Viable tissue/material including Exudate, Fat Layer (and Subcutaneous Tissue) Exposed, Fibrin/Slough, Eschar, and Subcutaneous after achieving pain control using Lidocaine 4% Topical Solution. A time out was conducted at 15:16, prior to the start of the procedure. A Minimum amount of bleeding was controlled with Pressure. The procedure was tolerated well with a pain level of 0 throughout and a  pain level of 0 following the procedure. Post Debridement Measurements: 2.3cm length x 2cm width x 0.1cm depth; 0.361cm^3 volume. Character of Wound/Ulcer Post Debridement requires further debridement. Severity of Tissue Post Debridement is: Fat layer exposed. Post procedure Diagnosis Wound #1: Same as Pre-Procedure Plan Wound Cleansing: Wound #1 Right Calcaneus: Cleanse wound with mild soap and water May Shower, gently pat wound dry prior to applying new  dressing. Anesthetic: Wound #1 Right Calcaneus: Topical Lidocaine 4% cream applied to wound bed prior to debridement - in clinic Skin Barriers/Peri-Wound Care: Wound #1 Right Calcaneus: Barrier cream Primary Wound Dressing: Wound #1 Right Calcaneus: Aquacel Ag Secondary Dressing: Wound #1 Right Calcaneus: Gauze and Kerlix/Conform Other - heel pads Dressing Change Frequency: [name], JAINA MORIN ([id number]) Wound #1 Right Calcaneus: Change Dressing Monday, Wednesday, Friday - she comes to the wound clinic wednesdays Follow-up Appointments: Wound #1 Right Calcaneus: Return Appointment in 1 week. Edema Control: Wound #1 Right Calcaneus: Elevate legs to the level of the heart and pump ankles as often as possible Off-Loading: Wound #1 Right Calcaneus: Other: - elevated legs pillows Additional Orders / Instructions: Wound #1 Right Calcaneus: Increase protein intake. Home Health: Wound #1 Right Calcaneus: Continue Home Health Visits - [place] Nurse may visit PRN to address patient s wound care needs. FACE TO FACE ENCOUNTER: MEDICARE and MEDICAID PATIENTS: I certify that this patient is under my care and that I had a face-to-face encounter that meets the physician face-to-face encounter requirements with this patient on this date. The encounter with the patient was in whole or in part for the following MEDICAL CONDITION: (primary reason for [place]) MEDICAL NECESSITY: I certify, that based on my findings, NURSING services are a medically necessary home health service. HOME BOUND STATUS: I certify that my clinical findings support that this patient is homebound (i.e., Due to illness or injury, pt requires aid of supportive devices such as crutches, cane, wheelchairs, walkers, the use of special transportation or the assistance of another person to leave their place of residence. There is a normal inability to leave the home and doing so requires  considerable and taxing effort. Other absences are for medical reasons / religious services and are infrequent or of short duration when for other reasons). If current dressing causes regression in wound condition, may D/C ordered dressing product/s and apply Normal Saline Moist Dressing daily until next [place] / Other MD appointment. [place] of regression in wound condition at [phone]. Please direct any NON-WOUND related issues/requests for orders to patient's Primary Care Physician Services and Therapies ordered were: Arterial Studies- Bilateral - wound on right heel, dopplers signals irregular, abi <0.7 #1 Wagner's grade 2 diabetic foot ulcer on the right heel. #2 this may have been somewhat larger at one point however about 50% of the area appears to be reasonably epithelialized #3 the patient has significant arterial insufficiency but at least according to the patient and her husband may have had arterial interventions 2 or 3 years agoo Stents. If so I cannot find anything about this and [place] or care everywhere. We'll check and see with the local [place] and vascular group whether they have seen this patient and might have records. If we can't find anything she is going to need formal arterial studies. [name], AMBREA HEGLER ([id number]) #4 patient did have an x-ray during an emergency room visit on 5/28 that was negative for osteomyelitis. #5 the patient is only minimally ambulatory. Has a wheelchair  at home. She is getting physical therapy however. I asked her to keep the pressure off this area which might include an open toed sandal type shoe and especially at night when she is lying in bed. Electronic Signature(s) Signed: 07/19/2016 5:26:18 PM By: Linton Ham MD Entered By: Linton Ham on 07/19/2016 16:29:32 [name], [name]  ([id number]) -------------------------------------------------------------------------------- ROS/PFSH Details [name], Matisse Date of Service: 07/19/2016 2:15 PM [name] Patient Account Number: [id number] Medical Record Treating RN: Baruch Gouty RN, BSN, Velva Harman [id number] Number: Other Clinician: Date of Birth/Sex: [birth year] (83 y.o. Female) Treating Matilda Fleig, [place] Primary Care Provider: LAM, LYNN Provider/Extender: G Referring Provider: LAM, LYNN Weeks in Treatment: 0 Information Obtained From Patient Caregiver Chart Wound History Do you currently have one or more open woundso Yes How many open wounds do you currently haveo 1 Approximately how long have you had your woundso 2 month How have you been treating your wound(s) until nowo dryn ssd Has your wound(s) ever healed and then re-openedo No Have you had any lab work done in the past montho No Have you tested positive for an antibiotic resistant organism (MRSA, VRE)o No Have you tested positive for osteomyelitis (bone infection)o No Have you had any tests for circulation on your legso No Have you had other problems associated with your woundso Infection, Swelling Eyes Complaints and Symptoms: Positive for: Glasses / Contacts Medical History: Positive for: Cataracts - re moved Cardiovascular Complaints and Symptoms: Positive for: LE edema Medical History: Positive for: Arrhythmia; Coronary Artery Disease; Hypertension Integumentary (Skin) Complaints and Symptoms: Positive for: Wounds; Breakdown; Swelling Medical History: Positive for: History of pressure wounds Negative for: History of Burn [name], Rosaelena C. ([id number]) Musculoskeletal Complaints and Symptoms: Positive for: Muscle Weakness Medical History: Positive for: Osteoarthritis Neurologic Complaints and Symptoms: Positive for: Numbness/parasthesias Medical History: Positive for: Neuropathy - Diabetic Negative for: Quadriplegia; Paraplegia;  Seizure Disorder Constitutional Symptoms (General Health) Complaints and Symptoms: No Complaints or Symptoms Ear/Nose/Mouth/Throat Complaints and Symptoms: No Complaints or Symptoms Medical History: Negative for: Chronic sinus problems/congestion; Middle ear problems Hematologic/Lymphatic Complaints and Symptoms: No Complaints or Symptoms Medical History: Negative for: Anemia; Hemophilia; Human Immunodeficiency Virus; Lymphedema; Sickle Cell Disease Respiratory Complaints and Symptoms: No Complaints or Symptoms Medical History: Negative for: Aspiration; Asthma; Chronic Obstructive Pulmonary Disease (COPD); Pneumothorax; Sleep Apnea; Tuberculosis Gastrointestinal Complaints and Symptoms: No Complaints or Symptoms Medical History: BRIA, SPARR ([id number]) Negative for: Cirrhosis ; Colitis; Crohnos; Hepatitis A; Hepatitis B; Hepatitis C Past Medical History Notes: GERD Endocrine Medical History: Positive for: Type II Diabetes Time with diabetes: years Treated with: Insulin, Oral agents Blood sugar tested every day: No Blood sugar testing results: Bedtime: 352 Genitourinary Medical History: Past Medical History Notes: stage 3 kidney disease Immunological Complaints and Symptoms: No Complaints or Symptoms Medical History: Negative for: Lupus Erythematosus; Raynaudos; Scleroderma Oncologic Complaints and Symptoms: No Complaints or Symptoms Medical History: Negative for: Received Chemotherapy; Received Radiation Past Medical History Notes: early stage of cancer Psychiatric Complaints and Symptoms: No Complaints or Symptoms Medical History: Negative for: Anorexia/bulimia; Confinement Anxiety HBO Extended History Items Eyes: Cataracts [name], Reneka C. ([id number]) Immunizations Pneumococcal Vaccine: Received Pneumococcal Vaccination: No Family and Social History Cancer: Yes - Siblings; Diabetes: Yes - Siblings; Heart Disease: No; Hereditary  Spherocytosis: No; Hypertension: Yes - Mother; Kidney Disease: No; Lung Disease: No; Seizures: No; Stroke: Yes - Siblings, Mother; Thyroid Problems: No; Tuberculosis: No; Never smoker; Marital Status - Married; Alcohol Use: Never; Drug Use: No History; Caffeine Use: Moderate; Financial Concerns: No; Food, Clothing or  Shelter Needs: No; Support System Lacking: No; Transportation Concerns: No; Advanced Directives: No; Patient does not want information on Advanced Directives; Living Will: No Electronic Signature(s) Signed: 07/30/2016 8:31:02 PM By: Gretta Cool, BSN, RN, CWS, Kim RN, BSN Signed: 08/02/2016 5:07:07 PM By: Linton Ham MD Signed: 08/03/2016 3:16:06 PM By: Regan Lemming BSN, RN Previous Signature: 07/19/2016 5:05:29 PM Version By: Regan Lemming BSN, RN Previous Signature: 07/20/2016 8:11:06 AM Version By: Christin Fudge MD, FACS Entered By: Gretta Cool BSN, RN, CWS, Kim on 07/26/2016 14:33:01 [name], [name] ([id number]) -------------------------------------------------------------------------------- SuperBill Details [name], Callyn Date of Service: 07/19/2016 [name] Patient Account Number: [id number] Medical Record Treating RN: Cornell Barman [id number] Number: Other Clinician: Date of Birth/Sex: 1931-02-27 (83 y.o. 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 E11.51 Type 2 diabetes mellitus with diabetic peripheral angiopathy without gangrene E11.42 Type 2 diabetes mellitus with diabetic polyneuropathy Facility Procedures CPT4: Description Modifier Quantity Code [id number] 99213 - WOUND CARE VISIT-LEV 3 EST PT 1 CPT4: 16073710 11042 - DEB SUBQ TISSUE 20 SQ CM/< 1 ICD-10 Description Diagnosis  E11.621 Type 2 diabetes mellitus with foot ulcer 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

## 2016-07-24 DIAGNOSIS — I69398 Other sequelae of cerebral infarction: Secondary | ICD-10-CM | POA: Diagnosis not present

## 2016-07-24 DIAGNOSIS — E1151 Type 2 diabetes mellitus with diabetic peripheral angiopathy without gangrene: Secondary | ICD-10-CM | POA: Diagnosis not present

## 2016-07-24 DIAGNOSIS — M6281 Muscle weakness (generalized): Secondary | ICD-10-CM | POA: Diagnosis not present

## 2016-07-24 DIAGNOSIS — N183 Chronic kidney disease, stage 3 (moderate): Secondary | ICD-10-CM | POA: Diagnosis not present

## 2016-07-24 DIAGNOSIS — E1122 Type 2 diabetes mellitus with diabetic chronic kidney disease: Secondary | ICD-10-CM | POA: Diagnosis not present

## 2016-07-24 DIAGNOSIS — I5042 Chronic combined systolic (congestive) and diastolic (congestive) heart failure: Secondary | ICD-10-CM | POA: Diagnosis not present

## 2016-07-24 DIAGNOSIS — I110 Hypertensive heart disease with heart failure: Secondary | ICD-10-CM | POA: Diagnosis not present

## 2016-07-24 DIAGNOSIS — L89612 Pressure ulcer of right heel, stage 2: Secondary | ICD-10-CM | POA: Diagnosis not present

## 2016-07-24 DIAGNOSIS — S91114D Laceration without foreign body of right lesser toe(s) without damage to nail, subsequent encounter: Secondary | ICD-10-CM | POA: Diagnosis not present

## 2016-07-25 ENCOUNTER — Other Ambulatory Visit: Payer: Self-pay | Admitting: Internal Medicine

## 2016-07-25 ENCOUNTER — Ambulatory Visit: Payer: Medicare HMO

## 2016-07-25 ENCOUNTER — Other Ambulatory Visit: Payer: Self-pay | Admitting: *Deleted

## 2016-07-25 DIAGNOSIS — S81801S Unspecified open wound, right lower leg, sequela: Secondary | ICD-10-CM

## 2016-07-25 DIAGNOSIS — S91114D Laceration without foreign body of right lesser toe(s) without damage to nail, subsequent encounter: Secondary | ICD-10-CM | POA: Diagnosis not present

## 2016-07-25 DIAGNOSIS — I5042 Chronic combined systolic (congestive) and diastolic (congestive) heart failure: Secondary | ICD-10-CM | POA: Diagnosis not present

## 2016-07-25 DIAGNOSIS — I779 Disorder of arteries and arterioles, unspecified: Secondary | ICD-10-CM | POA: Diagnosis not present

## 2016-07-25 DIAGNOSIS — M6281 Muscle weakness (generalized): Secondary | ICD-10-CM | POA: Diagnosis not present

## 2016-07-25 DIAGNOSIS — E1122 Type 2 diabetes mellitus with diabetic chronic kidney disease: Secondary | ICD-10-CM | POA: Diagnosis not present

## 2016-07-25 DIAGNOSIS — I739 Peripheral vascular disease, unspecified: Secondary | ICD-10-CM | POA: Diagnosis not present

## 2016-07-25 DIAGNOSIS — L89612 Pressure ulcer of right heel, stage 2: Secondary | ICD-10-CM | POA: Diagnosis not present

## 2016-07-25 DIAGNOSIS — I69398 Other sequelae of cerebral infarction: Secondary | ICD-10-CM | POA: Diagnosis not present

## 2016-07-25 DIAGNOSIS — N183 Chronic kidney disease, stage 3 (moderate): Secondary | ICD-10-CM | POA: Diagnosis not present

## 2016-07-25 DIAGNOSIS — E1151 Type 2 diabetes mellitus with diabetic peripheral angiopathy without gangrene: Secondary | ICD-10-CM | POA: Diagnosis not present

## 2016-07-25 DIAGNOSIS — R6889 Other general symptoms and signs: Secondary | ICD-10-CM | POA: Diagnosis not present

## 2016-07-25 DIAGNOSIS — I110 Hypertensive heart disease with heart failure: Secondary | ICD-10-CM | POA: Diagnosis not present

## 2016-07-25 DIAGNOSIS — S81801A Unspecified open wound, right lower leg, initial encounter: Secondary | ICD-10-CM | POA: Diagnosis not present

## 2016-07-25 LAB — VAS US LOWER EXTREMITY ARTERIAL DUPLEX
LPOPDPSV: 92 cm/s
LPOPPPSV: 160 cm/s
LPOPPROXDYSV: 0 cm/s
LSFADISTDYSV: 0 cm/s
LSFAMIDVEL: 0 cm/s
LSFAPROXDYS: 0 cm/s
LSFMPSV: -165 cm/s
LSFPPSV: 123 cm/s
Left ant tibial mid sys: -81 cm/s
Left super femoral dist sys PSV: -168 cm/s
RIGHT ANT MID TIBIAL SYS PSV: 59 cm/s
RIGHT PERO MID DIA: -15 cm/s
RIGHT PERO MID SYS: -91 cm/s
RIGHT POPLITEAL DIST EDV: 0 cm/s
RIGHT POPLITEAL PROX EDV: 0 cm/s
RIGHT POST TIB MID SYS: 60 cm/s
RIGHT SUPER FEMORAL DIST EDV: -45 cm/s
RIGHT SUPER FEMORAL MID EDV: -23 cm/s
RPOPDPSV: -133 cm/s
RSFDPSV: -285 cm/s
RSFMPSV: -230 cm/s
RTSFAPROXDIA: -32 cm/s
Right popliteal prox sys PSV: 180 cm/s
Right super femoral prox sys PSV: -262 cm/s

## 2016-07-25 NOTE — Patient Outreach (Signed)
[facility]) Care Management  07/25/2016  ALLYANA VOGAN [birth year] [id number]   Transition of care call   Spoke with patient reports she is doing alright.She discussed her recent to wound center and visit today for study on her veins. Patient discussed she used [employer] transportation service to appointments and her daughter provided return transportation.  Patient discussed she has visit to wound care center tomorrow and her husband will not be able to go with her and she is unsure if her daughter provide return transportation . Patient dicussed some difficulty with keeping up with all the appointment on this week. Request I follow up with her daughter [name] that has been arranging and assisting with transportation to office visits.  Patient report she had a weak spell on Sunday and had difficulty getting off the commode and she had to call her daughter to assist her, blood sugar was 130, she thinks she waited to long before she ate something. Patient reports she doesn't recall taking her insulin this morning because she had to leave too early for appointment , this afternoon it was a little high because she checked it after eating a hot dog and french fries  and it was a little high.'  Patient recalls home health PT visit today. Patient reports her air conditioning in home is not working she has a fan, air conditioner to be repaired on tomorrow. Encouraged patient to drink adequate fluids, stay near so she can feel fan, denies being too hot in home.    Plan  Placed call to daughter [name] , states she has arranged transportation via [employer] to visit and she will provide return transportation, she will call patient and remind her. Also discussed that patient is concerned that her husband will not be available to ride with her.  Will plan home visit in the next week for continued complex care management follow up.  Will reinforce with patient importance of monitoring  blood sugar and taking medications as prescribed.  Reviewed symptoms of low blood sugar and how to treat, encouraged balanced meals.   [name], RN, [facility] [phone]- Mobile [phone]- Toll Free Main Office

## 2016-07-25 NOTE — Progress Notes (Unsigned)
Tin-

## 2016-07-26 ENCOUNTER — Encounter: Payer: Medicare HMO | Admitting: Internal Medicine

## 2016-07-26 DIAGNOSIS — I5042 Chronic combined systolic (congestive) and diastolic (congestive) heart failure: Secondary | ICD-10-CM | POA: Diagnosis not present

## 2016-07-26 DIAGNOSIS — E1142 Type 2 diabetes mellitus with diabetic polyneuropathy: Secondary | ICD-10-CM | POA: Diagnosis not present

## 2016-07-26 DIAGNOSIS — E11628 Type 2 diabetes mellitus with other skin complications: Secondary | ICD-10-CM | POA: Diagnosis not present

## 2016-07-26 DIAGNOSIS — I872 Venous insufficiency (chronic) (peripheral): Secondary | ICD-10-CM | POA: Diagnosis not present

## 2016-07-26 DIAGNOSIS — I2510 Atherosclerotic heart disease of native coronary artery without angina pectoris: Secondary | ICD-10-CM | POA: Diagnosis not present

## 2016-07-26 DIAGNOSIS — M6281 Muscle weakness (generalized): Secondary | ICD-10-CM | POA: Diagnosis not present

## 2016-07-26 DIAGNOSIS — J449 Chronic obstructive pulmonary disease, unspecified: Secondary | ICD-10-CM | POA: Diagnosis not present

## 2016-07-26 DIAGNOSIS — I739 Peripheral vascular disease, unspecified: Secondary | ICD-10-CM | POA: Diagnosis not present

## 2016-07-26 DIAGNOSIS — N183 Chronic kidney disease, stage 3 (moderate): Secondary | ICD-10-CM | POA: Diagnosis not present

## 2016-07-26 DIAGNOSIS — E1151 Type 2 diabetes mellitus with diabetic peripheral angiopathy without gangrene: Secondary | ICD-10-CM | POA: Diagnosis not present

## 2016-07-26 DIAGNOSIS — S91301A Unspecified open wound, right foot, initial encounter: Secondary | ICD-10-CM | POA: Diagnosis not present

## 2016-07-26 DIAGNOSIS — E11621 Type 2 diabetes mellitus with foot ulcer: Secondary | ICD-10-CM | POA: Diagnosis not present

## 2016-07-26 DIAGNOSIS — I110 Hypertensive heart disease with heart failure: Secondary | ICD-10-CM | POA: Diagnosis not present

## 2016-07-26 DIAGNOSIS — M1990 Unspecified osteoarthritis, unspecified site: Secondary | ICD-10-CM | POA: Diagnosis not present

## 2016-07-26 DIAGNOSIS — R6889 Other general symptoms and signs: Secondary | ICD-10-CM | POA: Diagnosis not present

## 2016-07-26 DIAGNOSIS — I69398 Other sequelae of cerebral infarction: Secondary | ICD-10-CM | POA: Diagnosis not present

## 2016-07-26 DIAGNOSIS — E1122 Type 2 diabetes mellitus with diabetic chronic kidney disease: Secondary | ICD-10-CM | POA: Diagnosis not present

## 2016-07-26 DIAGNOSIS — S91114D Laceration without foreign body of right lesser toe(s) without damage to nail, subsequent encounter: Secondary | ICD-10-CM | POA: Diagnosis not present

## 2016-07-26 DIAGNOSIS — L97411 Non-pressure chronic ulcer of right heel and midfoot limited to breakdown of skin: Secondary | ICD-10-CM | POA: Diagnosis not present

## 2016-07-26 DIAGNOSIS — Z794 Long term (current) use of insulin: Secondary | ICD-10-CM | POA: Diagnosis not present

## 2016-07-26 DIAGNOSIS — L89612 Pressure ulcer of right heel, stage 2: Secondary | ICD-10-CM | POA: Diagnosis not present

## 2016-07-27 DIAGNOSIS — E1122 Type 2 diabetes mellitus with diabetic chronic kidney disease: Secondary | ICD-10-CM | POA: Diagnosis not present

## 2016-07-27 DIAGNOSIS — E1151 Type 2 diabetes mellitus with diabetic peripheral angiopathy without gangrene: Secondary | ICD-10-CM | POA: Diagnosis not present

## 2016-07-27 DIAGNOSIS — I5042 Chronic combined systolic (congestive) and diastolic (congestive) heart failure: Secondary | ICD-10-CM | POA: Diagnosis not present

## 2016-07-27 DIAGNOSIS — I110 Hypertensive heart disease with heart failure: Secondary | ICD-10-CM | POA: Diagnosis not present

## 2016-07-27 DIAGNOSIS — I69398 Other sequelae of cerebral infarction: Secondary | ICD-10-CM | POA: Diagnosis not present

## 2016-07-27 DIAGNOSIS — M6281 Muscle weakness (generalized): Secondary | ICD-10-CM | POA: Diagnosis not present

## 2016-07-27 DIAGNOSIS — N183 Chronic kidney disease, stage 3 (moderate): Secondary | ICD-10-CM | POA: Diagnosis not present

## 2016-07-27 DIAGNOSIS — S91114D Laceration without foreign body of right lesser toe(s) without damage to nail, subsequent encounter: Secondary | ICD-10-CM | POA: Diagnosis not present

## 2016-07-27 DIAGNOSIS — L89612 Pressure ulcer of right heel, stage 2: Secondary | ICD-10-CM | POA: Diagnosis not present

## 2016-07-27 NOTE — Progress Notes (Signed)
ARLETA, OSTRUM ([id number]) Visit Report for 07/26/2016 Arrival Information Details [name], Sahira Date of Service: 07/26/2016 2:00 PM [name] Patient Account Number: [id number] Medical Record Treating RN: Cornell Barman [id number] Number: Other Clinician: Date of Birth/Sex: March 29, 1931 (83 y.o. Female) Treating ROBSON, MICHAEL Primary Care Ragina Fenter: LAM, LYNN Lekeya Rollings/Extender: G Referring Seleena Reimers: LAM, LYNN Weeks in Treatment: 1 Visit Information History Since Last Visit Added or deleted any medications: No Patient Arrived: Wheel Chair Any new allergies or adverse reactions: No Arrival Time: 13:33 Had a fall or experienced change in No activities of daily living that may affect Accompanied By: husband risk of falls: Transfer Assistance: Manual Signs or symptoms of abuse/neglect since last No Patient Identification Verified: Yes visito Secondary Verification Process Yes Hospitalized since last visit: No Completed: Has Dressing in Place as Prescribed: Yes Patient Requires Transmission-Based No Has Compression in Place as Prescribed: Yes Precautions: Pain Present Now: No Patient Has Alerts: No Electronic Signature(s) Signed: 07/26/2016 2:28:22 PM By: Gretta Cool, BSN, RN, CWS, Kim RN, BSN Entered By: Gretta Cool, BSN, RN, CWS, Kim on 07/26/2016 13:37:17 [name], [name] ([id number]) -------------------------------------------------------------------------------- Clinic Level of Care Assessment Details [name], Eshika Date of Service: 07/26/2016 2:00 PM [name] Patient Account Number: [id number] Medical Record Treating RN: Cornell Barman [id number] Number: Other Clinician: Date of Birth/Sex: November 20, 1931 (83 y.o. Female) Treating ROBSON, [place] Primary Care Cyntia Staley: LAM, LYNN Haru Shaff/Extender: G Referring [name] Cosby: LAM, LYNN Weeks in Treatment: 1 Clinic Level of Care Assessment Items TOOL 4 Quantity Score []  - Use when only an EandM is performed  on FOLLOW-UP visit 0 ASSESSMENTS - Nursing Assessment / Reassessment []  - Reassessment of Co-morbidities (includes updates in patient status) 0 X - Reassessment of Adherence to Treatment Plan 1 5 ASSESSMENTS - Wound and Skin Assessment / Reassessment X - Simple Wound Assessment / Reassessment - one wound 1 5 []  - Complex Wound Assessment / Reassessment - multiple wounds 0 []  - Dermatologic / Skin Assessment (not related to wound area) 0 ASSESSMENTS - Focused Assessment []  - Circumferential Edema Measurements - multi extremities 0 []  - Nutritional Assessment / Counseling / Intervention 0 []  - Lower Extremity Assessment (monofilament, tuning fork, pulses) 0 []  - Peripheral Arterial Disease Assessment (using hand held doppler) 0 ASSESSMENTS - Ostomy and/or Continence Assessment and Care []  - Incontinence Assessment and Management 0 []  - Ostomy Care Assessment and Management (repouching, etc.) 0 PROCESS - Coordination of Care X - Simple Patient / Family Education for ongoing care 1 15 []  - Complex (extensive) Patient / Family Education for ongoing care 0 X - Staff obtains Programmer, systems, Records, Test Results / Process Orders 1 10 []  - Staff telephones HHA, Nursing Homes / Clarify orders / etc 0 [name], ASSYRIA MORREALE. ([id number]) []  - Routine Transfer to another Facility (non-emergent condition) 0 []  - Routine Hospital Admission (non-emergent condition) 0 []  - New Admissions / Biomedical engineer / Ordering NPWT, Apligraf, etc. 0 []  - Emergency Hospital Admission (emergent condition) 0 X - Simple Discharge Coordination 1 10 []  - Complex (extensive) Discharge Coordination 0 PROCESS - Special Needs []  - Pediatric / Minor Patient Management 0 []  - Isolation Patient Management 0 []  - Hearing / Language / Visual special needs 0 []  - Assessment of Community assistance (transportation, D/C planning, etc.) 0 []  - Additional assistance / Altered mentation 0 []  - Support Surface(s) Assessment  (bed, cushion, seat, etc.) 0 INTERVENTIONS - Wound Cleansing / Measurement X - Simple Wound Cleansing - one wound 1 5 []  - Complex Wound Cleansing -  multiple wounds 0 X - Wound Imaging (photographs - any number of wounds) 1 5 []  - Wound Tracing (instead of photographs) 0 X - Simple Wound Measurement - one wound 1 5 []  - Complex Wound Measurement - multiple wounds 0 INTERVENTIONS - Wound Dressings X - Small Wound Dressing one or multiple wounds 1 10 []  - Medium Wound Dressing one or multiple wounds 0 []  - Large Wound Dressing one or multiple wounds 0 []  - Application of Medications - topical 0 []  - Application of Medications - injection 0 [name], Jleigh C. ([id number]) INTERVENTIONS - Miscellaneous []  - External ear exam 0 []  - Specimen Collection (cultures, biopsies, blood, body fluids, etc.) 0 []  - Specimen(s) / Culture(s) sent or taken to Lab for analysis 0 []  - Patient Transfer (multiple staff / Civil Service fast streamer / Similar devices) 0 []  - Simple Staple / Suture removal (25 or less) 0 []  - Complex Staple / Suture removal (26 or more) 0 []  - Hypo / Hyperglycemic Management (close monitor of Blood Glucose) 0 []  - Ankle / Brachial Index (ABI) - do not check if billed separately 0 X - Vital Signs 1 5 Has the patient been seen at the hospital within the last three years: Yes Total Score: 75 Level Of Care: New/Established - Level 2 Electronic Signature(s) Signed: 07/26/2016 2:28:22 PM By: Gretta Cool, BSN, RN, CWS, Kim RN, BSN Entered By: Gretta Cool, BSN, RN, CWS, Kim on 07/26/2016 14:05:19 [name], [name] ([id number]) -------------------------------------------------------------------------------- Encounter Discharge Information Details [name], Romie Minus Date of Service: 07/26/2016 2:00 PM [name] Patient Account Number: [id number] Medical Record Treating RN: Cornell Barman [id number] Number: Other Clinician: Date of Birth/Sex: [birth year] (83 y.o. Female) Treating ROBSON,  MICHAEL Primary Care Shanyce Daris: LAM, LYNN Aurelio Mccamy/Extender: G Referring Kamilia Carollo: LAM, LYNN Weeks in Treatment: 1 Encounter Discharge Information Items Discharge Pain Level: 0 Discharge Condition: Stable Ambulatory Status: Wheelchair Discharge Destination: Home Transportation: Private Auto daughter and Accompanied By: husband Schedule Follow-up Appointment: Yes Medication Reconciliation completed and provided to Patient/Care Yes Chaquita Basques: Provided on Clinical Summary of Care: 07/26/2016 Form Type Recipient Paper Patient JSM Electronic Signature(s) Signed: 07/26/2016 2:15:34 PM By: Ruthine Dose Entered By: Ruthine Dose on 07/26/2016 14:15:33 [name], [name] ([id number]) -------------------------------------------------------------------------------- Lower Extremity Assessment Details [name], Roselind Date of Service: 07/26/2016 2:00 PM [name] Patient Account Number: [id number] Medical Record Treating RN: Cornell Barman [id number] Number: Other Clinician: Date of Birth/Sex: 24-Dec-1931 (83 y.o. Female) Treating ROBSON, MICHAEL Primary Care Tish Begin: LAM, LYNN Earnstine Meinders/Extender: G Referring Tanaysia Bhardwaj: LAM, LYNN Weeks in Treatment: 1 Edema Assessment Assessed: [Left: No] [Right: No] Edema: [Left: Ye] [Right: s] Calf Left: Right: Point of Measurement: 38 cm From Medial Instep cm 26 cm Ankle Left: Right: Point of Measurement: 10 cm From Medial Instep cm 26 cm Vascular Assessment Pulses: Dorsalis Pedis Palpable: [Right:Yes] Posterior Tibial Extremity colors, hair growth, and conditions: Extremity Color: [Right:Hyperpigmented] Hair Growth on Extremity: [Right:Yes] Temperature of Extremity: [Right:Warm] Capillary Refill: [Right:> 3 seconds] Dependent Rubor: [Right:No] Blanched when Elevated: [Right:No] Lipodermatosclerosis: [Right:No] Toe Nail Assessment Left: Right: Thick: Yes Discolored: Yes Deformed: Yes Improper Length and Hygiene:  Yes Electronic Signature(s) CHERRY, TURLINGTON ([id number]) Signed: 07/26/2016 2:28:22 PM By: Gretta Cool, BSN, RN, CWS, Kim RN, BSN Entered By: Gretta Cool, BSN, RN, CWS, Kim on 07/26/2016 13:45:19 [name], [name] ([id number]) -------------------------------------------------------------------------------- Multi Wound Chart Details [name], Felcia Date of Service: 07/26/2016 2:00 PM [name] Patient Account Number: [id number] Medical Record Treating RN: Cornell Barman [id number] Number: Other Clinician: Date of Birth/Sex: 1931-10-04 (83 y.o. Female) Treating  Linton Ham Primary Care Sammie Schermerhorn: LAM, LYNN Orpha Dain/Extender: G Referring Kobyn Kray: LAM, LYNN Weeks in Treatment: 1 Vital Signs Height(in): 63 Pulse(bpm): 63 Weight(lbs): 203 Blood Pressure 147/54 (mmHg): Body Mass Index(BMI): 36 Temperature(F): 98.2 Respiratory Rate 16 (breaths/min): Photos: [N/A:N/A] Wound Location: Right Calcaneus N/A N/A Wounding Event: Gradually Appeared N/A N/A Primary Etiology: Diabetic Wound/Ulcer of N/A N/A the Lower Extremity Comorbid History: Cataracts, Arrhythmia, N/A N/A Coronary Artery Disease, Hypertension, Type II Diabetes, History of pressure wounds, Osteoarthritis, Neuropathy Date Acquired: 06/13/2016 N/A N/A Weeks of Treatment: 1 N/A N/A Wound Status: Open N/A N/A Measurements L x W x D 2x1.5x0.1 N/A N/A (cm) Area (cm) : 2.356 N/A N/A Volume (cm) : 0.236 N/A N/A % Reduction in Area: 34.80% N/A N/A % Reduction in Volume: 34.60% N/A N/A Classification: Grade 1 N/A N/A Exudate Amount: Medium N/A N/A Exudate Type: Serosanguineous N/A N/A Exudate Color: red, brown N/A N/A [name], Cashae C. ([id number]) Wound Margin: Distinct, outline attached N/A N/A Granulation Amount: Medium (34-66%) N/A N/A Granulation Quality: Pale N/A N/A Necrotic Amount: Medium (34-66%) N/A N/A Exposed Structures: Fat Layer (Subcutaneous N/A N/A Tissue) Exposed: Yes Fascia:  No Tendon: No Muscle: No Joint: No Bone: No Epithelialization: None N/A N/A Periwound Skin Texture: Excoriation: No N/A N/A Induration: No Callus: No Crepitus: No Rash: No Scarring: No Periwound Skin Dry/Scaly: Yes N/A N/A Moisture: Maceration: No Periwound Skin Color: Atrophie Blanche: No N/A N/A Cyanosis: No Ecchymosis: No Erythema: No Hemosiderin Staining: No Mottled: No Pallor: No Rubor: No Temperature: No Abnormality N/A N/A Tenderness on No N/A N/A Palpation: Wound Preparation: Ulcer Cleansing: N/A N/A Rinsed/Irrigated with Saline Topical Anesthetic Applied: Other: lidocaine 4% Treatment Notes Electronic Signature(s) Signed: 07/26/2016 2:28:22 PM By: Gretta Cool, BSN, RN, CWS, Kim RN, BSN Entered By: Gretta Cool, BSN, RN, CWS, Kim on 07/26/2016 14:02:00 [name], [name] ([id number]) -------------------------------------------------------------------------------- Multi-Disciplinary Care Plan Details [name], Romie Minus Date of Service: 07/26/2016 2:00 PM [name] Patient Account Number: [id number] Medical Record Treating RN: Cornell Barman [id number] Number: Other Clinician: Date of Birth/Sex: December 27, 1931 (83 y.o. Female) Treating ROBSON, MICHAEL Primary Care Venissa Nappi: LAM, LYNN Brandelyn Henne/Extender: G Referring Liviana Mills: LAM, LYNN Weeks in Treatment: 1 Active Inactive ` Orientation to the Wound Care Program Nursing Diagnoses: Knowledge deficit related to the wound healing center program Goals: Patient/caregiver will verbalize understanding of the [facility] [place] Date Initiated: 07/26/2016 Target Resolution Date: 07/31/2016 Goal Status: Active Interventions: Provide education on orientation to the wound center Notes: ` Soft Tissue Infection Nursing Diagnoses: Impaired tissue integrity Goals: Signs and symptoms of infection will be recognized early to allow for prompt treatment Date Initiated: 07/26/2016 Target Resolution Date:  07/31/2016 Goal Status: Active Interventions: Assess signs and symptoms of infection every visit Notes: ` Wound/Skin Impairment Nursing Diagnoses: JOBETH, PANGILINAN ([id number]) Impaired tissue integrity Goals: Ulcer/skin breakdown will heal within 14 weeks Date Initiated: 07/26/2016 Target Resolution Date: 10/30/2016 Goal Status: Active Interventions: Assess patient/caregiver ability to perform ulcer/skin care regimen upon admission and as needed Treatment Activities: Skin care regimen initiated : 07/26/2016 Notes: Electronic Signature(s) Signed: 07/26/2016 2:28:22 PM By: Gretta Cool, BSN, RN, CWS, Kim RN, BSN Entered By: Gretta Cool, BSN, RN, CWS, Kim on 07/26/2016 13:46:46 [name], [name] ([id number]) -------------------------------------------------------------------------------- Pain Assessment Details [name], Kimmerly Date of Service: 07/26/2016 2:00 PM [name] Patient Account Number: [id number] Medical Record Treating RN: Cornell Barman [id number] Number: Other Clinician: Date of Birth/Sex: [birth year] (83 y.o. Female) Treating ROBSON, MICHAEL Primary Care Ruben Mahler: LAM, LYNN Tashira Torre/Extender: G Referring Joshuah Minella: LAM, LYNN Weeks in Treatment: 1 Active  Problems Location of Pain Severity and Description of Pain Patient Has Paino No Site Locations With Dressing Change: No Pain Management and Medication Current Pain Management: Goals for Pain Management Topical or injectable lidocaine is offered to patient for acute pain when surgical debridement is performed. If needed, Patient is instructed to use over the counter pain medication for the following 24-48 hours after debridement. Wound care MDs do not prescribed pain medications. Patient has chronic pain or uncontrolled pain. Patient has been instructed to make an appointment with their Primary Care Physician for pain management. Electronic Signature(s) Signed: 07/26/2016 2:28:22 PM By: Gretta Cool, BSN, RN, CWS,  Kim RN, BSN Entered By: Gretta Cool, BSN, RN, CWS, Kim on 07/26/2016 13:37:40 [name], [name] ([id number]) -------------------------------------------------------------------------------- Patient/Caregiver Education Details [name], Romie Minus Date of Service: 07/26/2016 2:00 PM [name] Patient Account Number: [id number] Medical Record Treating RN: Cornell Barman [id number] Number: Other Clinician: Date of Birth/Gender: 07/08/1931 (83 y.o. Female) Treating ROBSON, MICHAEL Primary Care Physician/Extender: Autumn Messing Physician: Suella [place] in Treatment: 1 Referring Physician: LAM, LYNN Education Assessment Education Provided To: Patient Education Topics Provided Wound/Skin Impairment: Handouts: Caring for Your Ulcer, Other: continue wound care as prescribed Methods: Demonstration Responses: State content correctly Electronic Signature(s) Signed: 07/26/2016 2:28:22 PM By: Gretta Cool, BSN, RN, CWS, Kim RN, BSN Entered By: Gretta Cool, BSN, RN, CWS, Kim on 07/26/2016 14:06:58 [name], [name] ([id number]) -------------------------------------------------------------------------------- Wound Assessment Details [name], Shaune Date of Service: 07/26/2016 2:00 PM [name] Patient Account Number: [id number] Medical Record Treating RN: Cornell Barman [id number] Number: Other Clinician: Date of Birth/Sex: 1931/07/30 (83 y.o. Female) Treating ROBSON, MICHAEL Primary Care Athen Riel: LAM, LYNN Alga Southall/Extender: G Referring [place] Kinyon: LAM, LYNN Weeks in Treatment: 1 Wound Status Wound Number: 1 Primary Diabetic Wound/Ulcer of the Lower Etiology: Extremity Wound Location: Right Calcaneus Wound Open Wounding Event: Gradually Appeared Status: Date Acquired: 06/13/2016 Comorbid Cataracts, Arrhythmia, Coronary Artery Weeks Of Treatment: 1 History: Disease, Hypertension, Type II Clustered Wound: No Diabetes, History of pressure wounds, Osteoarthritis, Neuropathy Photos Wound  Measurements Length: (cm) 2 Width: (cm) 1.5 Depth: (cm) 0.1 Area: (cm) 2.356 Volume: (cm) 0.236 % Reduction in Area: 34.8% % Reduction in Volume: 34.6% Epithelialization: None Tunneling: No Undermining: No Wound Description Classification: Grade 1 Foul Odor Aft Wound Margin: Distinct, outline attached Slough/Fibrin Exudate Amount: Medium Exudate Type: Serosanguineous Exudate Color: red, brown er Cleansing: No o Yes Wound Bed Granulation Amount: Medium (34-66%) Exposed Structure Granulation Quality: Pale Fascia Exposed: No Necrotic Amount: Medium (34-66%) Fat Layer (Subcutaneous Tissue) Exposed: Yes Necrotic Quality: Adherent Slough Tendon Exposed: No [name], Caidynce C. ([id number]) Muscle Exposed: No Joint Exposed: No Bone Exposed: No Periwound Skin Texture Texture Color No Abnormalities Noted: No No Abnormalities Noted: No Callus: No Atrophie Blanche: No Crepitus: No Cyanosis: No Excoriation: No Ecchymosis: No Induration: No Erythema: No Rash: No Hemosiderin Staining: No Scarring: No Mottled: No Pallor: No Moisture Rubor: No No Abnormalities Noted: No Dry / Scaly: Yes Temperature / Pain Maceration: No Temperature: No Abnormality Wound Preparation Ulcer Cleansing: Rinsed/Irrigated with Saline Topical Anesthetic Applied: Other: lidocaine 4%, Treatment Notes Wound #1 (Right Calcaneus) 1. Cleansed with: Clean wound with Normal Saline 2. Anesthetic Topical Lidocaine 4% cream to wound bed prior to debridement 4. Dressing Applied: Aquacel Ag 5. Secondary Dressing Applied Foam Notes Kerlix Electronic Signature(s) Signed: 07/26/2016 2:28:22 PM By: Gretta Cool, BSN, RN, CWS, Kim RN, BSN Entered By: Gretta Cool, BSN, RN, CWS, Kim on 07/26/2016 13:43:11 [name], [name] ([id number]) -------------------------------------------------------------------------------- Vitals Details [name], Saia Date of Service: 07/26/2016 2:00 PM Patient  Name: C. Patient Account Number: [id number] Medical Record Treating RN: Cornell Barman [id number] Number: Other Clinician: Date of Birth/Sex: 06/09/1931 (83 y.o. Female) Treating ROBSON, MICHAEL Primary Care Canio Winokur: LAM, LYNN Khushbu Pippen/Extender: G Referring Macon Sandiford: LAM, LYNN Weeks in Treatment: 1 Vital Signs Time Taken: 13:38 Temperature (F): 98.2 Height (in): 63 Pulse (bpm): 63 Weight (lbs): 203 Respiratory Rate (breaths/min): 16 Body Mass Index (BMI): 36 Blood Pressure (mmHg): 147/54 Reference Range: 80 - 120 mg / dl Electronic Signature(s) Signed: 07/26/2016 2:28:22 PM By: Gretta Cool, BSN, RN, CWS, Kim RN, BSN Entered By: Gretta Cool, BSN, RN, CWS, Kim on 07/26/2016 13:38:29

## 2016-07-28 NOTE — Progress Notes (Signed)
[name] [name], [name] [name] ([id number]) Visit Report for 07/26/2016 HPI Details [name], [name] Date of Service: 07/26/2016 2:00 PM [name] Patient Account Number: [id number] Medical Record Treating RN: [name] [name] [id number] Number: Other Clinician: Date of Birth/Sex: 1931/08/30 (83 y.o. Female) Treating [name] [name] Primary Care Provider: LAM, [name] Provider/Extender: G Referring Provider: LAM, [name] Weeks in Treatment: 1 History of Present Illness HPI Description: 07/19/16; this is an 83 year old woman who tells me that she has had a wound on her right heel for about a month and a half. She said she was hospitalized with postmenopausal vaginal bleeding and was hospitalized ultimately at [facility] for this. I do not have these records. Afterward she was sent to [place] for rehabilitation. She states there she developed a wound on her foot. The patient is a type II diabetic on insulin, I'm not really sure of her diabetic control. She has well care or home health we think they are putting Silvadene cream on this. ABIs in our clinic were 0.6 bilaterally. The patient and her husband it was somewhat vague history of possible stent placement in the right thigh 2-3 years ago although I don't see anything about this and [place] link. We will need to research this more before we see her again next week. She did have a normal x-ray of the heel at the end of May I believe when she was seen in the ER. There was no evidence of osteomyelitis although soft tissue edema was noted. Looking through care everywhere and [place] link I can see no reference to vascular surgery, noninvasive vascular tests. It is possible that this was non-at the local vein and vascular Center before they became part of the network. This would be [name] [name] and [name] [name] we will check with their office 07/26/16; the patient's arterial studies were really not very good she has an ABI in the right of 0.52 and  a TBI on the right of 0.34. Corresponding values on the left for better at 0.83 on the left and a TBI of 0.54. She was felt to have right mid SFA occlusion and a 50-74% long segment stenosis and diffuse 30-49% left femoral popliteal disease with an occluded left tibial peroneal trunk. In spite of this her wound on her right heel appears to be a lot better today. Smaller with a healthy granulated surface. I have research notes in [place]. The patient is indeed followed by Dr. Gwenlyn [name] of cardiology for coronary artery disease status post coronary artery bypass grafting about 7 years ago. He also notes a history of PAD and procedures by [name] [name] in [place]. In spite of this the patient's wound is better today. She is nonambulatory does not complain of pain or claudication and really I don't think given everything that she requires an arteriogram or an invasive arterial evaluation. Electronic Signature(s) Signed: 07/26/2016 5:43:18 PM By: [name] Ham MD Entered By: [name] [name] on 07/26/2016 14:51:03 [name] [name] ([id number]) [name] [name] ([id number]) -------------------------------------------------------------------------------- Physical Exam Details [name], [name] Date of Service: 07/26/2016 2:00 PM [name] Patient Account Number: [id number] Medical Record Treating RN: [name] [name] [id number] Number: Other Clinician: Date of Birth/Sex: [birth year] (83 y.o. Female) Treating [name] [name] Primary Care Provider: LAM, [name] Provider/Extender: G Referring Provider: LAM, [name] Weeks in Treatment: 1 Constitutional Patient is hypertensive.. Pulse regular and within target range for patient.[name] Respirations regular, non-labored and within target range.. Temperature is normal and within the target range for the patient.[name] appears in no  distress. Eyes Conjunctivae clear. No discharge. Neck Neck supple and symmetrical. No masses or  crepitus. Respiratory Respiratory effort is easy and symmetric bilaterally. Rate is normal at rest and on room air.. Cardiovascular Pedal pulses absent bilaterally.. Lymphatic Nonpalpable in the right popliteal or inguinal area. Psychiatric No evidence of depression, anxiety, or agitation. Calm, cooperative, and communicative. Appropriate interactions and affect.. Notes Wound exam; this patient's wound is on the tip of her right heel. I debrided this last week, it does not require this this week. Clean healthy surface with diminished dimensions. Electronic Signature(s) Signed: 07/26/2016 5:43:18 PM By: [name] Ham MD Entered By: [name] [name] on 07/26/2016 14:52:38 [name] [name] ([id number]) -------------------------------------------------------------------------------- Physician Orders Details [name], [name] Date of Service: 07/26/2016 2:00 PM [name] Patient Account Number: [id number] Medical Record Treating RN: [name] [name] [id number] Number: Other Clinician: Date of Birth/Sex: 1932-01-09 (83 y.o. Female) Treating [name] [name] Primary Care Provider: LAM, [name] Provider/Extender: G Referring Provider: LAM, [name] Weeks in Treatment: 1 Verbal / Phone Orders: No Diagnosis Coding Wound Cleansing Wound #1 Right Calcaneus o Cleanse wound with mild soap and water o May Shower, gently pat wound dry prior to applying new dressing. Anesthetic Wound #1 Right Calcaneus o Topical Lidocaine 4% cream applied to wound bed prior to debridement - in clinic Skin Barriers/Peri-Wound Care Wound #1 Right Calcaneus o Barrier cream Primary Wound Dressing Wound #1 Right Calcaneus o Aquacel Ag Secondary Dressing Wound #1 Right Calcaneus o Gauze and Kerlix/Conform o Other - heel pads Dressing Change Frequency Wound #1 Right Calcaneus o Change Dressing Monday, Wednesday, Friday - she comes to the wound clinic wednesdays Follow-up  Appointments Wound #1 Right Calcaneus o Return Appointment in 1 week. Edema Control Wound #1 Right Calcaneus o Elevate legs to the level of the heart and pump ankles as often as possible [name], [name] ([id number]) Off-Loading Wound #1 Right Calcaneus o Other: - elevated legs pillows Additional Orders / Instructions Wound #1 Right Calcaneus o Increase protein intake. Home Health Wound #1 Right [place] Visits - [place] Nurse may visit PRN to address patientos wound care needs. o FACE TO FACE ENCOUNTER: MEDICARE and MEDICAID PATIENTS: I certify that this patient is under my care and that I had a face-to-face encounter that meets the physician face-to-face encounter requirements with this patient on this date. The encounter with the patient was in whole or in part for the following MEDICAL CONDITION: (primary reason for [place]) MEDICAL NECESSITY: I certify, that based on my findings, NURSING services are a medically necessary home health service. HOME BOUND STATUS: I certify that my clinical findings support that this patient is homebound (i.e., Due to illness or injury, pt requires aid of supportive devices such as crutches, cane, wheelchairs, walkers, the use of special transportation or the assistance of another person to leave their place of residence. There is a normal inability to leave the home and doing so requires considerable and taxing effort. Other absences are for medical reasons / religious services and are infrequent or of short duration when for other reasons). o If current dressing causes regression in wound condition, may D/C ordered dressing product/s and apply Normal Saline Moist Dressing daily until next [place] / Other MD appointment. [place] of regression in wound condition at [phone]. o Please direct any NON-WOUND related issues/requests for orders  to patient's Primary Care Physician Electronic Signature(s) Signed: 07/26/2016 2:28:22 PM By: Gretta Cool, BSN, RN, CWS, Kim  RN, BSN Signed: 07/26/2016 5:43:18 PM By: [name] Ham MD Entered By: Gretta Cool, BSN, RN, CWS, Kim on 07/26/2016 14:04:36 [name] [name] ([id number]) -------------------------------------------------------------------------------- Problem List Details [name], [name] [name] Date of Service: 07/26/2016 2:00 PM [name] Patient Account Number: [id number] Medical Record Treating RN: [name] [name] [id number] Number: Other Clinician: Date of Birth/Sex: 12-10-1931 (83 y.o. 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 Patient Account Number: [id number] Medical Record Treating RN: [name] [name] [id number] Number: Other Clinician: Date of Birth/Sex: [birth year] (83 y.o. Female) Treating [name] [name] Primary Care Provider: LAM, [name] Provider/Extender: G Referring Provider: LAM, [name] Weeks in Treatment: 1 Subjective History of Present Illness (HPI) 07/19/16; this is an 83 year old woman who  tells me that she has had a wound on her right heel for about a month and a half. She said she was hospitalized with postmenopausal vaginal bleeding and was hospitalized ultimately at [facility] for this. I do not have these records. Afterward she was sent to [place] for rehabilitation. She states there she developed a wound on her foot. The patient is a type II diabetic on insulin, I'm not really sure of her diabetic control. She has well care or home health we think they are putting Silvadene cream on this. ABIs in our clinic were 0.6 bilaterally. The patient and her husband it was somewhat vague history of possible stent placement in the right thigh 2-3 years ago although I don't see anything about this and [place] link. We will need to research this more before we see her again next week. She did have a normal x-ray of the heel at the end of May I believe when she was seen in the ER. There was no evidence of osteomyelitis although soft tissue edema was noted. Looking through care everywhere and [place] link I can see no reference to vascular surgery, noninvasive vascular tests. It is possible that this was non-at the local vein and vascular Center before they became part of the network. This would be [name] [name] and [name] [name] we will check with their office 07/26/16; the patient's arterial studies were really not very good she has an ABI in the right of 0.52 and a TBI on the right of 0.34. Corresponding values on the left for better at 0.83 on the left and a TBI of 0.54. She was felt to have right mid SFA occlusion and a 50-74% long segment stenosis and diffuse 30-49% left femoral popliteal disease with an occluded left tibial peroneal trunk. In spite of this her wound on her right heel appears to be a lot better today. Smaller with a healthy granulated surface. I have research notes in [place]. The patient is indeed followed by Dr. Gwenlyn [name] of cardiology for coronary artery  disease status post coronary artery bypass grafting about 7 years ago. He also notes a history of PAD  and procedures by [name] [name] in [place]. In spite of this the patient's wound is better today. She is nonambulatory does not complain of pain or claudication and really I don't think given everything that she requires an arteriogram or an invasive arterial evaluation. Objective [name], [name] BOURDEAU ([id number]) Constitutional Patient is hypertensive.. Pulse regular and within target range for patient.[name] Respirations regular, non-labored and within target range.. Temperature is normal and within the target range for the patient.[name] appears in no distress. Vitals Time Taken: 1:38 PM, Height: 63 in, Weight: 203 lbs, BMI: 36, Temperature: 98.2 F, Pulse: 63 bpm, Respiratory Rate: 16 breaths/min, Blood Pressure: 147/54 mmHg. Eyes Conjunctivae clear. No discharge. Neck Neck supple and symmetrical. No masses or crepitus. Respiratory Respiratory effort is easy and symmetric bilaterally. Rate is normal at rest and on room air.. Cardiovascular Pedal pulses absent bilaterally.. Lymphatic Nonpalpable in the right popliteal or inguinal area. Psychiatric No evidence of depression, anxiety, or agitation. Calm, cooperative, and communicative. Appropriate interactions and affect.. General Notes: Wound exam; this patient's wound is on the tip of her right heel. I debrided this last week, it does not require this this week. Clean healthy surface with diminished dimensions. Integumentary (Hair, Skin) Wound #1 status is Open. Original cause of wound was Gradually Appeared. The wound is located on the Right Calcaneus. The wound measures 2cm length x 1.5cm width x 0.1cm depth; 2.356cm^2 area and 0.236cm^3 volume. There is Fat Layer (Subcutaneous Tissue) Exposed exposed. There is no tunneling or undermining noted. There is a medium amount of serosanguineous drainage noted. The wound margin is distinct with  the outline attached to the wound base. There is medium (34-66%) pale granulation within the wound bed. There is a medium (34-66%) amount of necrotic tissue within the wound bed including Adherent Slough. The periwound skin appearance exhibited: Dry/Scaly. The periwound skin appearance did not exhibit: Callus, Crepitus, Excoriation, Induration, Rash, Scarring, Maceration, Atrophie Blanche, Cyanosis, Ecchymosis, Hemosiderin Staining, Mottled, Pallor, Rubor, Erythema. Periwound temperature was noted as No Abnormality. 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 Anesthetic: Wound #1 Right Calcaneus: Topical Lidocaine 4% cream applied to wound bed prior to debridement - in clinic Skin Barriers/Peri-Wound Care: Wound #1 Right Calcaneus: Barrier cream Primary Wound Dressing: Wound #1 Right Calcaneus: Aquacel Ag Secondary Dressing: Wound #1 Right Calcaneus: Gauze and Kerlix/Conform Other - heel pads Dressing Change Frequency: Wound #1 Right Calcaneus: Change Dressing Monday, Wednesday, Friday - she comes to the wound clinic wednesdays Follow-up Appointments: Wound #1 Right Calcaneus: Return Appointment in 1 week. Edema Control: Wound #1 Right Calcaneus: Elevate legs to the level of the heart and pump ankles as often as possible Off-Loading: Wound #1 Right Calcaneus: Other: - elevated legs pillows Additional Orders / Instructions: Wound #1 Right Calcaneus: Increase protein intake. Home Health: Wound #1 Right  Calcaneus: Continue Home Health Visits - [facility] PHARRIS, CORTNEE STEINMILLER ([id number]) [place] Nurse may visit PRN to address patient s wound care needs. FACE TO FACE ENCOUNTER: MEDICARE and MEDICAID PATIENTS: I certify that this patient is under my care and that I had a face-to-face encounter  that meets the physician face-to-face encounter requirements with this patient on this date. The encounter with the patient was in whole or in part for the following MEDICAL CONDITION: (primary reason for [place]) MEDICAL NECESSITY: I certify, that based on my findings, NURSING services are a medically necessary home health service. HOME BOUND STATUS: I certify that my clinical findings support that this patient is homebound (i.e., Due to illness or injury, pt requires aid of supportive devices such as crutches, cane, wheelchairs, walkers, the use of special transportation or the assistance of another person to leave their place of residence. There is a normal inability to leave the home and doing so requires considerable and taxing effort. Other absences are for medical reasons / religious services and are infrequent or of short duration when for other reasons). If current dressing causes regression in wound condition, may D/C ordered dressing product/s and apply Normal Saline Moist Dressing daily until next [place] / Other MD appointment. [place] of regression in wound condition at [phone]. Please direct any NON-WOUND related issues/requests for orders to patient's Primary Care Physician #1 improved diabetic foot wound on the right heel in spite of significant underlying PAD. #2 as long as this wound continues to improve and/or heels over I don't believe she needs an arteriogram right now. The patient is nonambulatory and frail. #3 she is indeed followed by Dr. Gwenlyn [name] predominantly for coronary artery disease. By reviewing his notes she had  interventions by [name] [name]. This this of water husband says that she had stents placed although I don't have this information. #4 if the wound deteriorates or she develops additional wounds then referral to vascular interventional cardiology might be indicated but for now I think it is reasonable to continue to let this heal. #5 from reviewing [place] care everywhere it appears that this patient has endometrial carcinoma grade 1, nuclear grade 2. The exact status of this is unclear Electronic Signature(s) Signed: 07/26/2016 5:43:18 PM By: [name] Ham MD Entered By: [name] [name] on 07/26/2016 14:57:26 [name] [name] ([id number]) -------------------------------------------------------------------------------- SuperBill Details [name], Annica Date of Service: 07/26/2016 [name] Patient Account Number: [id number] Medical Record Treating RN: [name] [name] [id number] Number: Other Clinician: Date of Birth/Sex: [birth year] (83 y.o. 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 E11.51 Type 2 diabetes mellitus with diabetic peripheral angiopathy without gangrene E11.42 Type 2 diabetes mellitus with diabetic polyneuropathy Facility Procedures CPT4 Code: [id number] Description: 70786 - WOUND CARE VISIT-LEV 2 EST PT Modifier: Quantity: 1 Physician Procedures CPT4: Description Modifier Quantity Code [id number] 10071 - WC PHYS LEVEL 4 - EST PT 1 ICD-10 Description Diagnosis E11.621 Type 2 diabetes mellitus with foot ulcer 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

## 2016-08-01 ENCOUNTER — Encounter: Payer: Medicare HMO | Admitting: Internal Medicine

## 2016-08-01 ENCOUNTER — Ambulatory Visit: Payer: Self-pay | Admitting: *Deleted

## 2016-08-01 DIAGNOSIS — L89612 Pressure ulcer of right heel, stage 2: Secondary | ICD-10-CM | POA: Diagnosis not present

## 2016-08-01 DIAGNOSIS — E1142 Type 2 diabetes mellitus with diabetic polyneuropathy: Secondary | ICD-10-CM | POA: Diagnosis not present

## 2016-08-01 DIAGNOSIS — I69398 Other sequelae of cerebral infarction: Secondary | ICD-10-CM | POA: Diagnosis not present

## 2016-08-01 DIAGNOSIS — I110 Hypertensive heart disease with heart failure: Secondary | ICD-10-CM | POA: Diagnosis not present

## 2016-08-01 DIAGNOSIS — M1990 Unspecified osteoarthritis, unspecified site: Secondary | ICD-10-CM | POA: Diagnosis not present

## 2016-08-01 DIAGNOSIS — R600 Localized edema: Secondary | ICD-10-CM | POA: Diagnosis not present

## 2016-08-01 DIAGNOSIS — I5042 Chronic combined systolic (congestive) and diastolic (congestive) heart failure: Secondary | ICD-10-CM | POA: Diagnosis not present

## 2016-08-01 DIAGNOSIS — J449 Chronic obstructive pulmonary disease, unspecified: Secondary | ICD-10-CM | POA: Diagnosis not present

## 2016-08-01 DIAGNOSIS — I872 Venous insufficiency (chronic) (peripheral): Secondary | ICD-10-CM | POA: Diagnosis not present

## 2016-08-01 DIAGNOSIS — I10 Essential (primary) hypertension: Secondary | ICD-10-CM | POA: Diagnosis not present

## 2016-08-01 DIAGNOSIS — Z794 Long term (current) use of insulin: Secondary | ICD-10-CM | POA: Diagnosis not present

## 2016-08-01 DIAGNOSIS — M6281 Muscle weakness (generalized): Secondary | ICD-10-CM | POA: Diagnosis not present

## 2016-08-01 DIAGNOSIS — I2510 Atherosclerotic heart disease of native coronary artery without angina pectoris: Secondary | ICD-10-CM | POA: Diagnosis not present

## 2016-08-01 DIAGNOSIS — E11621 Type 2 diabetes mellitus with foot ulcer: Secondary | ICD-10-CM | POA: Diagnosis not present

## 2016-08-01 DIAGNOSIS — E1129 Type 2 diabetes mellitus with other diabetic kidney complication: Secondary | ICD-10-CM | POA: Diagnosis not present

## 2016-08-01 DIAGNOSIS — L97411 Non-pressure chronic ulcer of right heel and midfoot limited to breakdown of skin: Secondary | ICD-10-CM | POA: Diagnosis not present

## 2016-08-01 DIAGNOSIS — N183 Chronic kidney disease, stage 3 (moderate): Secondary | ICD-10-CM | POA: Diagnosis not present

## 2016-08-01 DIAGNOSIS — S91301A Unspecified open wound, right foot, initial encounter: Secondary | ICD-10-CM | POA: Diagnosis not present

## 2016-08-01 DIAGNOSIS — E1151 Type 2 diabetes mellitus with diabetic peripheral angiopathy without gangrene: Secondary | ICD-10-CM | POA: Diagnosis not present

## 2016-08-01 DIAGNOSIS — E1122 Type 2 diabetes mellitus with diabetic chronic kidney disease: Secondary | ICD-10-CM | POA: Diagnosis not present

## 2016-08-01 DIAGNOSIS — S91114D Laceration without foreign body of right lesser toe(s) without damage to nail, subsequent encounter: Secondary | ICD-10-CM | POA: Diagnosis not present

## 2016-08-02 ENCOUNTER — Ambulatory Visit: Payer: Self-pay | Admitting: *Deleted

## 2016-08-02 ENCOUNTER — Encounter: Payer: Self-pay | Admitting: *Deleted

## 2016-08-02 ENCOUNTER — Other Ambulatory Visit: Payer: Self-pay | Admitting: *Deleted

## 2016-08-02 DIAGNOSIS — N183 Chronic kidney disease, stage 3 (moderate): Secondary | ICD-10-CM | POA: Diagnosis not present

## 2016-08-02 DIAGNOSIS — M6281 Muscle weakness (generalized): Secondary | ICD-10-CM | POA: Diagnosis not present

## 2016-08-02 DIAGNOSIS — I110 Hypertensive heart disease with heart failure: Secondary | ICD-10-CM | POA: Diagnosis not present

## 2016-08-02 DIAGNOSIS — I69398 Other sequelae of cerebral infarction: Secondary | ICD-10-CM | POA: Diagnosis not present

## 2016-08-02 DIAGNOSIS — E1122 Type 2 diabetes mellitus with diabetic chronic kidney disease: Secondary | ICD-10-CM | POA: Diagnosis not present

## 2016-08-02 DIAGNOSIS — S91114D Laceration without foreign body of right lesser toe(s) without damage to nail, subsequent encounter: Secondary | ICD-10-CM | POA: Diagnosis not present

## 2016-08-02 DIAGNOSIS — L89612 Pressure ulcer of right heel, stage 2: Secondary | ICD-10-CM | POA: Diagnosis not present

## 2016-08-02 DIAGNOSIS — E1151 Type 2 diabetes mellitus with diabetic peripheral angiopathy without gangrene: Secondary | ICD-10-CM | POA: Diagnosis not present

## 2016-08-02 DIAGNOSIS — I5042 Chronic combined systolic (congestive) and diastolic (congestive) heart failure: Secondary | ICD-10-CM | POA: Diagnosis not present

## 2016-08-02 NOTE — Patient Outreach (Addendum)
[facility]) Care Management   08/02/2016  [name] [name] 1931-08-17 [id number]  [name] [name] is an 83 y.o. female  Subjective:  Patient reports she is making it. Patient discussed her recent medical appointment on yesterday, with kidney doctor that stopped lisinopril due to kidney function. Patient discussed appointment at wound center related to right heel wound, she reports that wound is getting smaller, but wound doctor wants her to see MD about recent blood flow stent.  Patient reports she is checked her blood sugar this morning it was 302 after eating breakfast. She reports she is taking 70/30 insulin 56 units in the morning and evening.   Patient discussed she is walking some better, reports she walked out to sit on the porch the other day, stayed a little too long and had trouble getting up, her husband was unable to hear her in the house so she had her phone called 911 to help her get back in the house.  Objective:  BP (!) 142/72 (BP Location: Left Arm, Patient Position: Sitting, Cuff Size: Large)   Pulse 62   Resp 18   SpO2 98%  Review of Systems  Constitutional: Negative.   HENT: Negative.   Eyes: Negative.   Respiratory: Negative.   Cardiovascular: Positive for leg swelling. Negative for chest pain.  Gastrointestinal: Negative.   Genitourinary: Negative.   Musculoskeletal: Negative.   Skin: Negative.   Neurological: Negative.        History of CVA with left side weakness   Endo/Heme/Allergies: Negative.   Psychiatric/Behavioral: Negative.        Forgetful at times    Physical Exam  Constitutional: She is oriented to person, place, and time. She appears well-developed and well-nourished.  Cardiovascular: Normal rate and normal heart sounds.   Respiratory: Effort normal and breath sounds normal.  GI: Soft.  Neurological: She is alert and oriented to person, place, and time.  Skin: Skin is warm and dry.     Psychiatric: She has a  normal mood and affect. Her behavior is normal. Judgment and thought content normal.  Reports being forgetful at times    Encounter Medications:   Outpatient Encounter Prescriptions as of 08/02/2016  Medication Sig Note  . ACCU-CHEK SOFTCLIX LANCETS lancets CHECK BLOOD SUGARS TWICE DAILY   . aspirin 81 MG tablet Take 81 mg by mouth daily.   . carvedilol (COREG) 6.25 MG tablet Take 6.25 mg by mouth 2 (two) times daily with a meal.   . furosemide (LASIX) 40 MG tablet TAKE 2 TABLETS BY MOUTH EVERY MORNING FOR FLUID 11/30/2015: 2 tablets in the morning and 2 tablets mid day   . insulin aspart (NOVOLOG FLEXPEN) 100 UNIT/ML FlexPen Inject into the skin 3 (three) times daily with meals. Sliding Scale 2 units-6 units patient has list for 100-400 BS readings  100-200, 2 units, 201-250, 4 units, 251--300 6 units, 301-350, 8 units, 351 to 400 10 units.   . Insulin NPH Isophane & Regular (NOVOLIN 70/30 [place]) Inject 40-50 Units into the skin. 50 units every morning  50 units every evening   . Insulin Pen Needle (RELION SHORT PEN NEEDLES) 31G X 8 MM MISC 1 each by Does not apply route 2 (two) times daily.   [name] lisinopril (PRINIVIL,ZESTRIL) 40 MG tablet TAKE 1 TABLET DAILY   . medroxyPROGESTERone (PROVERA) 5 MG tablet Take 5 mg by mouth daily.   . polyethylene glycol (MIRALAX / GLYCOLAX) packet USE 1-2 PACKETS DAILY   . pravastatin (PRAVACHOL)  40 MG tablet TAKE 1 TABLET BY MOUTH AT BEDTIME   . ranitidine (ZANTAC) 150 MG tablet Take 150 mg by mouth 2 (two) times daily.    No facility-administered encounter medications on file as of 08/02/2016.     Functional Status:   In your present state of health, do you have any difficulty performing the following activities: 06/21/2016 02/17/2016  Hearing? N N  Vision? Y Y  Difficulty concentrating or making decisions? [name]  Walking or climbing stairs? Y Y  Dressing or bathing? Y Y  Doing errands, shopping? [name]  Preparing Food and eating ? Y Y  Using the Toilet? Y Y   In the past six months, have you accidently leaked urine? Y Y  Do you have problems with loss of bowel control? N Y  Managing your Medications? Y Y  Managing your Finances? [name]  Housekeeping or managing your Housekeeping? Y Y  Some recent data might be hidden    Fall/Depression Screening:    Fall Risk  06/21/2016 05/26/2016 02/17/2016  Falls in the past year? Yes No Yes  Number falls in past yr: 1 - 1  Injury with Fall? No - No  Risk for fall due to : Impaired balance/gait;Impaired mobility Impaired vision;Impaired mobility;Impaired balance/gait Impaired balance/gait;Impaired mobility;History of fall(s)  Follow up Falls evaluation completed;Falls prevention discussed;Education provided - Falls prevention discussed   PHQ 2/9 Scores 06/21/2016 05/26/2016 04/05/2016 12/28/2015 11/22/2015 11/18/2015 02/23/2015  PHQ - 2 Score '1 1 1 ' 0 1 1 0    Assessment:  Routine home visit. Patients' sister in law [name] [name] present. Patient receiving home health services from [facility] for Home health RN, PT,OT.  Patient also has [facility].  Patient understanding is  home health RN is to visit 2 twice weekly for wound care and she goes to wound clinic once a week a now.  Home health therapy visit scheduled for today.   Diabetes  30 day average 249, patient denies having hypoglycemic episode. Checking blood sugar at least daily. Taking 56 units, 70/30 insulin  twice daily,reports rarely using novolog insulin she fears hypoglycemia.  Patient drinks regular soda. Will benefit from continued education and support of diabetes.   Right heel wound Consistent weekly follow up at wound clinic, will benefit from continued education on wound healing interventions, healthy balanced diet, adequate protein.  Followed by [facility] home health.    Medications  Patient medication reviewed , she receives pill packaging from [facility] in [place] and patient has  up  to date packaging without Lisinopril.  Patient not taking Aspirin. Will review  current medication list with PCP office, also to notify of current dose of insulin patient is taking.   Patient family is assisting with transportation to appointments or scheduling transportation with [facility] transportation. Patient has follow up appointment at wound clinic in next week and with Dr.Arida, cardiologist .   Plan:  Placed call to [place], NP office, spoke with nurse to review medication, inform of  dose of insulin patient is taking,and that she is currently not taking aspirin daily. Nurse verifies office records indicate patient list includes ASA 81 coated daily and 50 units 70/30 insulin am and pm. Requested medication list be sent to [facility] record. Reviewed with patient MD office medication record. Placed call to [facility] home health regarding schedule for home health RN visit for wound care, spoke with Loma Sousa and then scheduler  that verifies [facility] to visit on 6/28 and office will send request to wound clinic MD  regarding specific wound care orders for home health, update patient.  Will follow up with patient in the next 2 weeks by telephone  Will send PCP office this visit note.     [facility] CM Care Plan Problem One     Most Recent Value  Care Plan Problem One  Recent hospital admission and surgery then discharge to Skilled nursing facility   Role Documenting the Problem One  Care Management [place] for Problem One  Active  [facility] Long Term Goal   Patient will not experience a hospital admission in the next 60 days   [place] Term Goal Start Date  06/19/16  Interventions for Problem One Long Term Goal  Reinforced importance of taking medications as prescribed, placed call to PCP office regarding current medication list   [facility] CM Short Term Goal #1   Patient will report attending all medical appointments in the next 30 days   [facility] CM Short Term Goal #1 Start Date   06/20/16  [facility] CM Short Term Goal #1 Met Date  07/25/16  [facility] CM Short Term Goal #2   Patient will continue to check blood sugar at least twice daily in the next 30 days   [facility] CM Short Term Goal #2 Start Date  07/25/16 Barrie Folk restarted ]  Interventions for Short Term Goal #2  Explained importance of continuing to check blood sugar, verifies she has all supplies needed   [facility] CM Short Term Goal #3  Patient will report participating in all therapy actvities, to increase strenght and prevent falls in the next 30 day s  [facility] CM Short Term Goal #3 Start Date  06/28/16  Interventions for Short Tern Goal #3  Encouraged to continue to participate in   [facility] CM Short Term Goal #4  Patient will report continuing to adhere to measure to promote wound healing in the next 30 days  [goal restated ]  [facility] CM Short Term Goal #4 Start Date  07/25/16  Interventions for Short Term Goal #4  Explained the importance of keeping legs elevated as much as possible , how that helps with circulation , decrease swelling. Discussed  importance of balanced diet, reviewed healthy choices   [facility] CM Short Term Goal #5   Patient will reports increased knowledge of healthy food choices in the next 30 day s  [facility] CM Short Term Goal #5 Start Date  08/02/16  Interventions for Short Term Goal #5  Provided and reviewed low salt diet handout , including foods high salt foods to limit and healthy food choices examples reviewed, discussed limiiting sugary sodas can help with lowering blood sugars.       [name], RN, [place] Management [phone] Mobile [phone] Toll Free Main Office

## 2016-08-02 NOTE — Progress Notes (Signed)
[name] [name] [name] [name] ([id number]) Visit Report for 08/01/2016 Arrival Information Details [name] [name] Date of Service: 08/01/2016 2:00 PM [name] Patient Account Number: [id number] Medical Record Treating RN: [name] [name] [id number] Number: Other Clinician: Date of Birth/Sex: 06/06/1931 (83 y.o. Female) Treating [name] [name] Primary Care Deysi Soldo: [name] [name] Tauno Falotico/Extender: G Referring Willistine Ferrall: [name] [name] Weeks in Treatment: 1 Visit Information History Since Last Visit Added or deleted any medications: No Patient Arrived: Wheel Chair Any new allergies or adverse reactions: No Arrival Time: 14:05 Had a fall or experienced change in No activities of daily living that may affect Accompanied By: sister in risk of falls: law Signs or symptoms of abuse/neglect since last No Transfer Assistance: Manual visito Patient Identification Verified: Yes Hospitalized since last visit: No Secondary Verification Process Yes Pain Present Now: No Completed: Patient Requires Transmission-Based No Precautions: Patient Has Alerts: No Electronic Signature(s) Signed: 08/01/2016 4:42:12 PM By: [name] [name], BSN, RN, CWS, Kim RN, BSN Entered By: [name] [name], BSN, RN, CWS, Kim on 08/01/2016 14:06:18 [name] [name] Sia ([id number]) -------------------------------------------------------------------------------- Clinic Level of Care Assessment Details [name] [name] Date of Service: 08/01/2016 2:00 PM [name] Patient Account Number: [id number] Medical Record Treating RN: [name] [name] [id number] Number: Other Clinician: Date of Birth/Sex: 09/30/1931 (83 y.o. Female) Treating [name] [name] Primary Care Nyala Kirchner: [name] [name] Aldrick Derrig/Extender: G Referring Lashai Grosch: [name] [name] Weeks in Treatment: 1 Clinic Level of Care Assessment Items TOOL 4 Quantity Score []  - Use when only an EandM is performed on FOLLOW-UP visit 0 ASSESSMENTS - Nursing Assessment / Reassessment []  -  Reassessment of Co-morbidities (includes updates in patient status) 0 X - Reassessment of Adherence to Treatment Plan 1 5 ASSESSMENTS - Wound and Skin Assessment / Reassessment X - Simple Wound Assessment / Reassessment - one wound 1 5 []  - Complex Wound Assessment / Reassessment - multiple wounds 0 []  - Dermatologic / Skin Assessment (not related to wound area) 0 ASSESSMENTS - Focused Assessment []  - Circumferential Edema Measurements - multi extremities 0 []  - Nutritional Assessment / Counseling / Intervention 0 []  - Lower Extremity Assessment (monofilament, tuning fork, pulses) 0 []  - Peripheral Arterial Disease Assessment (using hand held doppler) 0 ASSESSMENTS - Ostomy and/or Continence Assessment and Care []  - Incontinence Assessment and Management 0 []  - Ostomy Care Assessment and Management (repouching, etc.) 0 PROCESS - Coordination of Care X - Simple Patient / Family Education for ongoing care 1 15 []  - Complex (extensive) Patient / Family Education for ongoing care 0 X - Staff obtains Programmer, systems, Records, Test Results / Process Orders 1 10 []  - Staff telephones HHA, Nursing Homes / Clarify orders / etc 0 [name] [name] LEGAULT. ([id number]) []  - Routine Transfer to another Facility (non-emergent condition) 0 []  - Routine Hospital Admission (non-emergent condition) 0 []  - New Admissions / Biomedical engineer / Ordering NPWT, Apligraf, etc. 0 []  - Emergency Hospital Admission (emergent condition) 0 X - Simple Discharge Coordination 1 10 []  - Complex (extensive) Discharge Coordination 0 PROCESS - Special Needs []  - Pediatric / Minor Patient Management 0 []  - Isolation Patient Management 0 []  - Hearing / Language / Visual special needs 0 []  - Assessment of Community assistance (transportation, D/C planning, etc.) 0 []  - Additional assistance / Altered mentation 0 []  - Support Surface(s) Assessment (bed, cushion, seat, etc.) 0 INTERVENTIONS - Wound Cleansing / Measurement X  - Simple Wound Cleansing - one wound 1 5 []  - Complex Wound Cleansing - multiple wounds 0 X - Wound Imaging (photographs - any number of  wounds) 1 5 []  - Wound Tracing (instead of photographs) 0 X - Simple Wound Measurement - one wound 1 5 []  - Complex Wound Measurement - multiple wounds 0 INTERVENTIONS - Wound Dressings X - Small Wound Dressing one or multiple wounds 1 10 []  - Medium Wound Dressing one or multiple wounds 0 []  - Large Wound Dressing one or multiple wounds 0 []  - Application of Medications - topical 0 []  - Application of Medications - injection 0 [name] [name] C. ([id number]) INTERVENTIONS - Miscellaneous []  - External ear exam 0 []  - Specimen Collection (cultures, biopsies, blood, body fluids, etc.) 0 []  - Specimen(s) / Culture(s) sent or taken to Lab for analysis 0 []  - Patient Transfer (multiple staff / Civil Service fast streamer / Similar devices) 0 []  - Simple Staple / Suture removal (25 or less) 0 []  - Complex Staple / Suture removal (26 or more) 0 []  - Hypo / Hyperglycemic Management (close monitor of Blood Glucose) 0 []  - Ankle / Brachial Index (ABI) - do not check if billed separately 0 X - Vital Signs 1 5 Has the patient been seen at the hospital within the last three years: Yes Total Score: 75 Level Of Care: New/Established - Level 2 Electronic Signature(s) Signed: 08/01/2016 4:42:12 PM By: [name] [name], BSN, RN, CWS, Kim RN, BSN Entered By: [name] [name], BSN, RN, CWS, Kim on 08/01/2016 14:45:28 [name] [name] Sia ([id number]) -------------------------------------------------------------------------------- Encounter Discharge Information Details [name] [name] Date of Service: 08/01/2016 2:00 PM [name] Patient Account Number: [id number] Medical Record Treating RN: [name] [name] [id number] Number: Other Clinician: Date of Birth/Sex: 1932/01/24 (83 y.o. Female) Treating [name] [name] Primary Care Xaviar Lunn: [name] [name] Shawna Kiener/Extender: G Referring Keyden Pavlov:  [name] [name] Weeks in Treatment: 1 Encounter Discharge Information Items Discharge Pain Level: 0 Discharge Condition: Stable Ambulatory Status: Wheelchair Discharge Destination: Home Transportation: Private Auto Accompanied By: sister in law Schedule Follow-up Appointment: Yes Medication Reconciliation completed and provided to Patient/Care Yes Sabatino Williard: Provided on Clinical Summary of Care: 08/01/2016 Form Type Recipient Paper Patient JMS Electronic Signature(s) Signed: 08/01/2016 2:47:26 PM By: [name] [name], BSN, RN, CWS, Kim RN, BSN Previous Signature: 08/01/2016 2:34:27 PM Version By: Ruthine Dose Entered By: [name] [name] BSN, RN, CWS, Kim on 08/01/2016 14:47:25 [name] [name] Sia ([id number]) -------------------------------------------------------------------------------- Lower Extremity Assessment Details [name] [name] Date of Service: 08/01/2016 2:00 PM [name] Patient Account Number: [id number] Medical Record Treating RN: [name] [name] [id number] Number: Other Clinician: Date of Birth/Sex: [birth year] (83 y.o. Female) Treating [name] [name] Primary Care Remas Sobel: [name] [name] Maryse Brierley/Extender: G Referring Aija Scarfo: [name] [name] Weeks in Treatment: 1 Edema Assessment Assessed: [Left: No] [Right: No] E[Left: dema] [Right: :] Calf Left: Right: Point of Measurement: 38 cm From Medial Instep cm 35.8 cm Ankle Left: Right: Point of Measurement: 10 cm From Medial Instep cm 25.4 cm Vascular Assessment Pulses: Dorsalis Pedis Palpable: [Right:Yes] Doppler Audible: [Right:Yes] Posterior Tibial Extremity colors, hair growth, and conditions: Extremity Color: [Right:Normal] Hair Growth on Extremity: [Right:Yes] Temperature of Extremity: [Right:Warm] Capillary Refill: [Right:< 3 seconds] Dependent Rubor: [Right:No] Blanched when Elevated: [Right:No] Lipodermatosclerosis: [Right:No] Toe Nail Assessment Left: Right: Thick: Yes Discolored: Yes Deformed: Yes Improper  Length and Hygiene: Yes ZORAH, BACKES ([id number]) Electronic Signature(s) Signed: 08/01/2016 4:42:12 PM By: [name] [name], BSN, RN, CWS, Kim RN, BSN Entered By: [name] [name], BSN, RN, CWS, Kim on 08/01/2016 14:11:15 [name] [name] Sia ([id number]) -------------------------------------------------------------------------------- Multi Wound Chart Details [name] [name] Date of Service: 08/01/2016 2:00 PM [name] Patient Account Number: [id number] Medical Record Treating RN: [name] [name] [id number] Number: Other Clinician: Date  of Birth/Sex: [birth year] (84 y.o. Female) Treating [name] [name] Primary Care Sherran Margolis: [name] [name] Joan Avetisyan/Extender: G Referring Emmagene Ortner: [name] [name] Weeks in Treatment: 1 Vital Signs Height(in): 63 Pulse(bpm): 62 Weight(lbs): 203 Blood Pressure 154/72 (mmHg): Body Mass Index(BMI): 36 Temperature(F): 98.4 Respiratory Rate 16 (breaths/min): Photos: [N/A:N/A] Wound Location: Right Calcaneus N/A N/A Wounding Event: Gradually Appeared N/A N/A Primary Etiology: Diabetic Wound/Ulcer of N/A N/A the Lower Extremity Comorbid History: Cataracts, Arrhythmia, N/A N/A Coronary Artery Disease, Hypertension, Type II Diabetes, History of pressure wounds, Osteoarthritis, Neuropathy Date Acquired: 06/13/2016 N/A N/A Weeks of Treatment: 1 N/A N/A Wound Status: Open N/A N/A Measurements L x W x D 1.6x1.5x0.1 N/A N/A (cm) Area (cm) : 1.885 N/A N/A Volume (cm) : 0.188 N/A N/A % Reduction in Area: 47.80% N/A N/A % Reduction in Volume: 47.90% N/A N/A Classification: Grade 1 N/A N/A Exudate Amount: Medium N/A N/A Exudate Type: Serosanguineous N/A N/A Exudate Color: red, brown N/A N/A [name], Ima C. ([id number]) Wound Margin: Distinct, outline attached N/A N/A Granulation Amount: Medium (34-66%) N/A N/A Granulation Quality: Pale N/A N/A Necrotic Amount: Medium (34-66%) N/A N/A Exposed Structures: Fat Layer (Subcutaneous N/A N/A Tissue)  Exposed: Yes Fascia: No Tendon: No Muscle: No Joint: No Bone: No Epithelialization: None N/A N/A Periwound Skin Texture: Excoriation: No N/A N/A Induration: No Callus: No Crepitus: No Rash: No Scarring: No Periwound Skin Dry/Scaly: Yes N/A N/A Moisture: Maceration: No Periwound Skin Color: Atrophie Blanche: No N/A N/A Cyanosis: No Ecchymosis: No Erythema: No Hemosiderin Staining: No Mottled: No Pallor: No Rubor: No Temperature: No Abnormality N/A N/A Tenderness on No N/A N/A Palpation: Wound Preparation: Ulcer Cleansing: N/A N/A Rinsed/Irrigated with Saline Topical Anesthetic Applied: Other: lidocaine 4% Treatment Notes Wound #1 (Right Calcaneus) 1. Cleansed with: Clean wound with Normal Saline 2. Anesthetic Topical Lidocaine 4% cream to wound bed prior to debridement 4. Dressing Applied: Aquacel Ag 5. Secondary Dressing Applied Foam [name], Rosmarie C. ([id number]) 7. Secured with Paper tape Notes Chartered certified accountant) Signed: 08/01/2016 4:49:17 PM By: Linton Ham MD Entered By: Linton Ham on 08/01/2016 15:11:44 [name] [name] Sia ([id number]) -------------------------------------------------------------------------------- Multi-Disciplinary Care Plan Details [name] [name] Minus Date of Service: 08/01/2016 2:00 PM [name] Patient Account Number: [id number] Medical Record Treating RN: [name] [name] [id number] Number: Other Clinician: Date of Birth/Sex: 1931/07/23 (83 y.o. Female) Treating [name] [name] Primary Care Salimatou Simone: [name] [name] Hazelgrace Bonham/Extender: G Referring Treniece Holsclaw: [name] [name] Weeks in Treatment: 1 Active Inactive ` Orientation to the Wound Care Program Nursing Diagnoses: Knowledge deficit related to the wound healing center program Goals: Patient/caregiver will verbalize understanding of the [facility] Date Initiated: 07/26/2016 Target Resolution Date: 07/31/2016 Goal Status:  Active Interventions: Provide education on orientation to the wound center Notes: ` Soft Tissue Infection Nursing Diagnoses: Impaired tissue integrity Goals: Signs and symptoms of infection will be recognized early to allow for prompt treatment Date Initiated: 07/26/2016 Target Resolution Date: 07/31/2016 Goal Status: Active Interventions: Assess signs and symptoms of infection every visit Notes: ` Wound/Skin Impairment Nursing Diagnoses: AMAZIN, PINCOCK ([id number]) Impaired tissue integrity Goals: Ulcer/skin breakdown will heal within 14 weeks Date Initiated: 07/26/2016 Target Resolution Date: 10/30/2016 Goal Status: Active Interventions: Assess patient/caregiver ability to perform ulcer/skin care regimen upon admission and as needed Treatment Activities: Skin care regimen initiated : 07/26/2016 Notes: Electronic Signature(s) Signed: 08/01/2016 4:42:12 PM By: [name] [name], BSN, RN, CWS, Kim RN, BSN Entered By: [name] [name], BSN, RN, CWS, Kim on 08/01/2016 14:11:22 [name] [name] Sia ([id number]) -------------------------------------------------------------------------------- Pain Assessment Details [name], Cherysh Date of Service: 08/01/2016  2:00 PM [name] Patient Account Number: [id number] Medical Record Treating RN: [name] [name] [id number] Number: Other Clinician: Date of Birth/Sex: 07-07-1931 (83 y.o. Female) Treating [name] [name] Primary Care Krisha Beegle: [name] [name] Demetre Monaco/Extender: G Referring Nayely Dingus: [name] [name] Weeks in Treatment: 1 Active Problems Location of Pain Severity and Description of Pain Patient Has Paino No Site Locations With Dressing Change: Yes Rate the pain. Current Pain Level: 3 Pain Management and Medication Current Pain Management: Goals for Pain Management Topical or injectable lidocaine is offered to patient for acute pain when surgical debridement is performed. If needed, Patient is instructed to use over the counter pain  medication for the following 24-48 hours after debridement. Wound care MDs do not prescribed pain medications. Patient has chronic pain or uncontrolled pain. Patient has been instructed to make an appointment with their Primary Care Physician for pain management. Electronic Signature(s) Signed: 08/01/2016 4:42:12 PM By: [name] [name], BSN, RN, CWS, Kim RN, BSN Entered By: [name] [name], BSN, RN, CWS, Kim on 08/01/2016 14:06:32 [name] [name] Sia ([id number]) -------------------------------------------------------------------------------- Patient/Caregiver Education Details [name] [name] Minus Date of Service: 08/01/2016 2:00 PM [name] Patient Account Number: [id number] Medical Record Treating RN: [name] [name] [id number] Number: Other Clinician: Date of Birth/Gender: 27-Sep-1931 (83 y.o. Female) Treating [name] [name] Primary Care Physician/Extender: Autumn Messing Physician: Suella [place] in Treatment: 1 Referring Physician: LAM, [name] Education Assessment Education Provided To: Patient Education Topics Provided Welcome To [facility]: Wound/Skin Impairment: Handouts: Caring for Your Ulcer, Other: wound care as prescribed Methods: Demonstration Responses: State content correctly Electronic Signature(s) Signed: 08/01/2016 4:42:12 PM By: [name] [name], BSN, RN, CWS, Kim RN, BSN Entered By: [name] [name], BSN, RN, CWS, Kim on 08/01/2016 14:48:14 [name] [name] Sia ([id number]) -------------------------------------------------------------------------------- Wound Assessment Details [name], Pietra Date of Service: 08/01/2016 2:00 PM [name] Patient Account Number: [id number] Medical Record Treating RN: [name] [name] [id number] Number: Other Clinician: Date of Birth/Sex: 17-Nov-1931 (83 y.o. Female) Treating [name] [name] Primary Care Anothy Bufano: [name] [name] Zariana Strub/Extender: G Referring Korrin Waterfield: [name] [name] Weeks in Treatment: 1 Wound Status Wound Number: 1 Primary Diabetic  Wound/Ulcer of the Lower Etiology: Extremity Wound Location: Right Calcaneus Wound Open Wounding Event: Gradually Appeared Status: Date Acquired: 06/13/2016 Comorbid Cataracts, Arrhythmia, Coronary Artery Weeks Of Treatment: 1 History: Disease, Hypertension, Type II Clustered Wound: No Diabetes, History of pressure wounds, Osteoarthritis, Neuropathy Photos Wound Measurements Length: (cm) 1.6 Width: (cm) 1.5 Depth: (cm) 0.1 Area: (cm) 1.885 Volume: (cm) 0.188 % Reduction in Area: 47.8% % Reduction in Volume: 47.9% Epithelialization: None Tunneling: No Undermining: No Wound Description Classification: Grade 1 Foul Odor Aft Wound Margin: Distinct, outline attached Slough/Fibrin Exudate Amount: Medium Exudate Type: Serosanguineous Exudate Color: red, brown er Cleansing: No o Yes Wound Bed Granulation Amount: Medium (34-66%) Exposed Structure Granulation Quality: Pale Fascia Exposed: No Necrotic Amount: Medium (34-66%) Fat Layer (Subcutaneous Tissue) Exposed: Yes Necrotic Quality: Adherent Slough Tendon Exposed: No [name], Jennamarie C. ([id number]) Muscle Exposed: No Joint Exposed: No Bone Exposed: No Periwound Skin Texture Texture Color No Abnormalities Noted: No No Abnormalities Noted: No Callus: No Atrophie Blanche: No Crepitus: No Cyanosis: No Excoriation: No Ecchymosis: No Induration: No Erythema: No Rash: No Hemosiderin Staining: No Scarring: No Mottled: No Pallor: No Moisture Rubor: No No Abnormalities Noted: No Dry / Scaly: Yes Temperature / Pain Maceration: No Temperature: No Abnormality Wound Preparation Ulcer Cleansing: Rinsed/Irrigated with Saline Topical Anesthetic Applied: Other: lidocaine 4%, Treatment Notes Wound #1 (Right Calcaneus) 1. Cleansed with: Clean wound with Normal Saline 2. Anesthetic Topical Lidocaine 4% cream to wound  bed prior to debridement 4. Dressing Applied: Aquacel Ag 5. Secondary Dressing  Applied Foam 7. Secured with Paper tape Notes Chartered certified accountant) Signed: 08/01/2016 4:42:12 PM By: [name] [name], BSN, RN, CWS, Kim RN, BSN Entered By: [name] [name], BSN, RN, CWS, Kim on 08/01/2016 14:08:56 [name] [name] Sia ([id number]) -------------------------------------------------------------------------------- Vitals Details [name], Tamlyn Date of Service: 08/01/2016 2:00 PM [name] Patient Account Number: [id number] Medical Record Treating RN: [name] [name] [id number] Number: Other Clinician: Date of Birth/Sex: Dec 01, 1931 (83 y.o. Female) Treating [name] [name] Primary Care Hewitt Garner: [name] [name] Evie Crumpler/Extender: G Referring Aaronmichael Brumbaugh: [name] [name] Weeks in Treatment: 1 Vital Signs Time Taken: 14:06 Temperature (F): 98.4 Height (in): 63 Pulse (bpm): 62 Weight (lbs): 203 Respiratory Rate (breaths/min): 16 Body Mass Index (BMI): 36 Blood Pressure (mmHg): 154/72 Reference Range: 80 - 120 mg / dl Electronic Signature(s) Signed: 08/01/2016 4:42:12 PM By: [name] [name], BSN, RN, CWS, Kim RN, BSN Entered By: [name] [name], BSN, RN, CWS, Kim on 08/01/2016 14:06:54

## 2016-08-02 NOTE — Progress Notes (Signed)
[name] [name] ([id number]) Visit Report for 08/01/2016 Chief Complaint Document Details [name], [name] Date of Service: 08/01/2016 2:00 PM [name] Patient Account Number: [id number] Medical Record Treating RN: Cornell Barman [id number] Number: Other Clinician: Date of Birth/Sex: 1931/12/17 (83 y.o. Female) Treating ROBSON, MICHAEL Primary Care Provider: LAM, LYNN Provider/Extender: G Referring Provider: LAM, LYNN Weeks in Treatment: 1 Information Obtained from: Patient Chief Complaint 07/19/16; patient is here for review of a wound on the right heel Electronic Signature(s) Signed: 08/01/2016 4:49:17 PM By: Linton Ham MD Entered By: Linton Ham on 08/01/2016 15:12:02 [name], [name] [name] ([id number]) -------------------------------------------------------------------------------- HPI Details [name], Abriel Date of Service: 08/01/2016 2:00 PM [name] Patient Account Number: [id number] Medical Record Treating RN: Cornell Barman [id number] Number: Other Clinician: Date of Birth/Sex: [birth year] (83 y.o. Female) Treating ROBSON, MICHAEL Primary Care Provider: LAM, LYNN Provider/Extender: G Referring Provider: LAM, LYNN Weeks in Treatment: 1 History of Present Illness HPI Description: 07/19/16; this is an 83 year old woman who tells me that she has had a wound on her right heel for about a month and a half. She said she was hospitalized with postmenopausal vaginal bleeding and was hospitalized ultimately at [facility] for this. I do not have these records. Afterward she was sent to [place] for rehabilitation. She states there she developed a wound on her foot. The patient is a type II diabetic on insulin, I'm not really sure of her diabetic control. She has well care or home health we think they are putting Silvadene cream on this. ABIs in our clinic were 0.6 bilaterally. The patient and her husband it was somewhat vague history of possible  stent placement in the right thigh 2-3 years ago although I don't see anything about this and [place] link. We will need to research this more before we see her again next week. She did have a normal x-ray of the heel at the end of May I believe when she was seen in the ER. There was no evidence of osteomyelitis although soft tissue edema was noted. Looking through care everywhere and [place] link I can see no reference to vascular surgery, noninvasive vascular tests. It is possible that this was non-at the local vein and vascular Center before they became part of the network. This would be Dr. Lucky Cowboy and Dr. Delana Meyer we will check with their office 07/26/16; the patient's arterial studies were really not very good she has an ABI in the right of 0.52 and a TBI on the right of 0.34. Corresponding values on the left for better at 0.83 on the left and a TBI of 0.54. She was felt to have right mid SFA occlusion and a 50-74% long segment stenosis and diffuse 30-49% left femoral popliteal disease with an occluded left tibial peroneal trunk. In spite of this her wound on her right heel appears to be a lot better today. Smaller with a healthy granulated surface. I have research notes in [place]. The patient is indeed followed by Dr. Gwenlyn Found of cardiology for coronary artery disease status post coronary artery bypass grafting about 7 years ago. He also notes a history of PAD and procedures by Dr. dew in [place]. In spite of this the patient's wound is better today. She is nonambulatory does not complain of pain or claudication and really I don't think given everything that she requires an arteriogram or an invasive arterial evaluation. 08/01/16; in spite of her likely significant PAD the patient's wound continues to contract. As long as  this is the case I am going to forego additional arterial consultation. We have been using silver alginate. She is down 0.4 cm in length today Electronic  Signature(s) Signed: 08/01/2016 4:49:17 PM By: Linton Ham MD Entered By: Linton Ham on 08/01/2016 15:13:21 [name], [name] [name] ([id number]) [name], [name] [name] ([id number]) -------------------------------------------------------------------------------- Physical Exam Details [name], [name] [name] Date of Service: 08/01/2016 2:00 PM [name] Patient Account Number: [id number] Medical Record Treating RN: Cornell Barman [id number] Number: Other Clinician: Date of Birth/Sex: 17-Sep-1931 (83 y.o. Female) Treating ROBSON, MICHAEL Primary Care Provider: LAM, LYNN Provider/Extender: G Referring Provider: LAM, LYNN Weeks in Treatment: 1 Constitutional Patient is hypertensive.. Pulse regular and within target range for patient.[name] Respirations regular, non-labored and within target range.. Temperature is normal and within the target range for the patient.[name] appears in no distress. Eyes Conjunctivae clear. No discharge. Respiratory Respiratory effort is easy and symmetric bilaterally. Rate is normal at rest and on room air.. Bilateral breath sounds are clear and equal in all lobes with no wheezes, rales or rhonchi.. Cardiovascular Pedal pulses absent bilaterally.. Lymphatic Unpalpable in the popliteal or inguinal area. Notes Wound exam; patient's wound is on the tip of her right heel. The surface of this is not 100% but it is smaller than last week and I elected not to go forth with debridement. She has healthy-looking rims of epithelialization Electronic Signature(s) Signed: 08/01/2016 4:49:17 PM By: Linton Ham MD Entered By: Linton Ham on 08/01/2016 15:15:31 [name], [name] [name] ([id number]) -------------------------------------------------------------------------------- Physician Orders Details [name], Annette Date of Service: 08/01/2016 2:00 PM [name] Patient Account Number: [id number] Medical Record Treating RN: Cornell Barman [id number] Number: Other Clinician: Date of Birth/Sex: 04-Apr-1931 (83 y.o. Female) Treating ROBSON, MICHAEL Primary Care Provider: LAM, LYNN Provider/Extender: G Referring Provider: LAM, LYNN Weeks in Treatment: 1 Verbal / Phone Orders: No Diagnosis Coding Wound Cleansing Wound #1 Right Calcaneus o Cleanse wound with mild soap and water o May Shower, gently pat wound dry prior to applying new dressing. Anesthetic Wound #1 Right Calcaneus o Topical Lidocaine 4% cream applied to wound bed prior to debridement - in clinic Skin Barriers/Peri-Wound Care Wound #1 Right Calcaneus o Barrier cream Primary Wound Dressing Wound #1 Right Calcaneus o Aquacel Ag Secondary Dressing Wound #1 Right Calcaneus o Gauze and Kerlix/Conform o Other - heel pads Dressing Change Frequency Wound #1 Right Calcaneus o Change Dressing Monday, Wednesday, Friday - she comes to the wound clinic wednesdays Follow-up Appointments Wound #1 Right Calcaneus o Return Appointment in 1 week. Edema Control Wound #1 Right Calcaneus o Elevate legs to the level of the heart and pump ankles as often as possible [name], Randy C. ([id number]) Off-Loading Wound #1 Right Calcaneus o Other: - elevated legs on pillows Additional Orders / Instructions Wound #1 Right Calcaneus o Increase protein intake. Home Health Wound #1 Right [place] Visits - [place] Nurse may visit PRN to address patientos wound care needs. o FACE TO FACE ENCOUNTER: MEDICARE and MEDICAID PATIENTS: I certify that this patient is under my care and that I had a face-to-face encounter that meets the physician face-to-face encounter requirements with this patient on this date. The encounter with the patient was in whole or in part for the following MEDICAL CONDITION: (primary reason for [place]) MEDICAL NECESSITY: I certify, that based on my findings, NURSING  services are a medically necessary home health service. HOME BOUND STATUS: I certify that my clinical findings support that this patient is homebound (  i.e., Due to illness or injury, pt requires aid of supportive devices such as crutches, cane, wheelchairs, walkers, the use of special transportation or the assistance of another person to leave their place of residence. There is a normal inability to leave the home and doing so requires considerable and taxing effort. Other absences are for medical reasons / religious services and are infrequent or of short duration when for other reasons). o If current dressing causes regression in wound condition, may D/C ordered dressing product/s and apply Normal Saline Moist Dressing daily until next [place] / Other MD appointment. [place] of regression in wound condition at [phone]. o Please direct any NON-WOUND related issues/requests for orders to patient's Primary Care Physician Electronic Signature(s) Signed: 08/01/2016 4:42:12 PM By: Gretta Cool, BSN, RN, CWS, Kim RN, BSN Signed: 08/01/2016 4:49:17 PM By: Linton Ham MD Entered By: Gretta Cool, BSN, RN, CWS, Kim on 08/01/2016 14:26:11 [name], [name] [name] ([id number]) -------------------------------------------------------------------------------- Problem List Details [name], Tiesha Date of Service: 08/01/2016 2:00 PM [name] Patient Account Number: [id number] Medical Record Treating RN: Cornell Barman [id number] Number: Other Clinician: Date of Birth/Sex: [birth year] (83 y.o. 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 Patient Account Number: [id number] Medical Record Treating RN: Cornell Barman [id number] Number: Other Clinician: Date of Birth/Sex: Jul 11, 1931 (83 y.o. Female) Treating ROBSON, MICHAEL Primary Care Provider: LAM, Jeani Hawking Provider/Extender: G Referring Provider: Waynette Buttery in Treatment: 1 Subjective Chief Complaint Information obtained from Patient 07/19/16; patient is here for review of a wound on the right heel History of Present Illness (HPI) 07/19/16; this is an 83 year old woman who tells me that she has had a wound on her right heel for about a month and a half. She said she was hospitalized with postmenopausal vaginal bleeding and was hospitalized ultimately at [facility] for this. I do not have these records. Afterward she was sent to [place] for rehabilitation. She states there she developed a wound on her foot. The patient is a type II diabetic on insulin, I'm not really sure of her diabetic control. She has well care or home health we think they are putting Silvadene cream on this. ABIs in our clinic were 0.6 bilaterally. The patient and her husband it was somewhat vague history of possible stent placement in the right thigh 2-3 years ago although I don't see anything about this and [place] link. We will need to research this more before we see her again next week. She did have a normal x-ray of the heel at the end of May  I believe when she was seen in the ER. There was no evidence of osteomyelitis although soft tissue edema was noted. Looking through care everywhere and [place] link I can see no reference to vascular surgery, noninvasive vascular tests. It is possible that this was non-at the local vein and  vascular Center before they became part of the network. This would be Dr. Lucky Cowboy and Dr. Delana Meyer we will check with their office 07/26/16; the patient's arterial studies were really not very good she has an ABI in the right of 0.52 and a TBI on the right of 0.34. Corresponding values on the left for better at 0.83 on the left and a TBI of 0.54. She was felt to have right mid SFA occlusion and a 50-74% long segment stenosis and diffuse 30-49% left femoral popliteal disease with an occluded left tibial peroneal trunk. In spite of this her wound on her right heel appears to be a lot better today. Smaller with a healthy granulated surface. I have research notes in [place]. The patient is indeed followed by Dr. Gwenlyn Found of cardiology for coronary artery disease status post coronary artery bypass grafting about 7 years ago. He also notes a history of PAD and procedures by Dr. dew in [place]. In spite of this the patient's wound is better today. She is nonambulatory does not complain of pain or claudication and really I don't think given everything that she requires an arteriogram or an invasive arterial evaluation. 08/01/16; in spite of her likely significant PAD the patient's wound continues to contract. As long as this is the case I am going to forego additional arterial consultation. We have been using silver alginate. She is down 0.4 cm in length today [name], Cherrise C. ([id number]) Objective Constitutional Patient is hypertensive.. Pulse regular and within target range for patient.[name] Respirations regular, non-labored and within target range.. Temperature is normal and within the target range for  the patient.[name] appears in no distress. Vitals Time Taken: 2:06 PM, Height: 63 in, Weight: 203 lbs, BMI: 36, Temperature: 98.4 F, Pulse: 62 bpm, Respiratory Rate: 16 breaths/min, Blood Pressure: 154/72 mmHg. Eyes Conjunctivae clear. No discharge. Respiratory Respiratory effort is easy and symmetric bilaterally. Rate is normal at rest and on room air.. Bilateral breath sounds are clear and equal in all lobes with no wheezes, rales or rhonchi.. Cardiovascular Pedal pulses absent bilaterally.. Lymphatic Unpalpable in the popliteal or inguinal area. General Notes: Wound exam; patient's wound is on the tip of her right heel. The surface of this is not 100% but it is smaller than last week and I elected not to go forth with debridement. She has healthy-looking rims of epithelialization Integumentary (Hair, Skin) Wound #1 status is Open. Original cause of wound was Gradually Appeared. The wound is located on the Right Calcaneus. The wound measures 1.6cm length x 1.5cm width x 0.1cm depth; 1.885cm^2 area and 0.188cm^3 volume. There is Fat Layer (Subcutaneous Tissue) Exposed exposed. There is no tunneling or undermining noted. There is a medium amount of serosanguineous drainage noted. The wound margin is distinct with the outline attached to the wound base. There is medium (34-66%) pale granulation within the wound bed. There is a medium (34-66%) amount of necrotic tissue within the wound bed including Adherent Slough. The periwound skin appearance exhibited: Dry/Scaly. The periwound skin appearance did not exhibit: Callus, Crepitus, Excoriation, Induration, Rash, Scarring, Maceration, Atrophie Blanche, Cyanosis, Ecchymosis, Hemosiderin Staining, Mottled, Pallor, Rubor, Erythema. Periwound temperature was noted as No Abnormality. [name], BUELAH RENNIE ([id number]) Assessment Active Problems ICD-10 E11.621 - Type 2 diabetes mellitus with foot ulcer L97.411 - Non-pressure chronic ulcer of  right heel and midfoot limited to breakdown of skin E11.51 - Type 2 diabetes mellitus with diabetic peripheral angiopathy without gangrene E11.42 - Type 2 diabetes mellitus with diabetic polyneuropathy Plan  Wound Cleansing: Wound #1 Right Calcaneus: Cleanse wound with mild soap and water May Shower, gently pat wound dry prior to applying new dressing. Anesthetic: Wound #1 Right Calcaneus: Topical Lidocaine 4% cream applied to wound bed prior to debridement - in clinic Skin Barriers/Peri-Wound Care: Wound #1 Right Calcaneus: Barrier cream Primary Wound Dressing: Wound #1 Right Calcaneus: Aquacel Ag Secondary Dressing: Wound #1 Right Calcaneus: Gauze and Kerlix/Conform Other - heel pads Dressing Change Frequency: Wound #1 Right Calcaneus: Change Dressing Monday, Wednesday, Friday - she comes to the wound clinic wednesdays Follow-up Appointments: Wound #1 Right Calcaneus: Return Appointment in 1 week. Edema Control: Wound #1 Right Calcaneus: Elevate legs to the level of the heart and pump ankles as often as possible Off-Loading: Wound #1 Right Calcaneus: Other: - elevated legs on pillows Additional Orders / Instructions: Wound #1 Right Calcaneus: [name], Akaiya C. ([id number]) Increase protein intake. Home Health: Wound #1 Right Calcaneus: Continue Home Health Visits - [place] Nurse may visit PRN to address patient s wound care needs. FACE TO FACE ENCOUNTER: MEDICARE and MEDICAID PATIENTS: I certify that this patient is under my care and that I had a face-to-face encounter that meets the physician face-to-face encounter requirements with this patient on this date. The encounter with the patient was in whole or in part for the following MEDICAL CONDITION: (primary reason for [place]) MEDICAL NECESSITY: I certify, that based on my findings, NURSING services are a medically necessary home health service. HOME BOUND STATUS: I certify that my  clinical findings support that this patient is homebound (i.e., Due to illness or injury, pt requires aid of supportive devices such as crutches, cane, wheelchairs, walkers, the use of special transportation or the assistance of another person to leave their place of residence. There is a normal inability to leave the home and doing so requires considerable and taxing effort. Other absences are for medical reasons / religious services and are infrequent or of short duration when for other reasons). If current dressing causes regression in wound condition, may D/C ordered dressing product/s and apply Normal Saline Moist Dressing daily until next [place] / Other MD appointment. [place] of regression in wound condition at [phone]. Please direct any NON-WOUND related issues/requests for orders to patient's Primary Care Physician #1 I'm not going to change the current treatment plan #2 Aquacel Ag, heel pad kerlix and conform #3 continue with current treatment as long as this wound is Programmer, systems Signature(s) Signed: 08/01/2016 4:49:17 PM By: Linton Ham MD Entered By: Linton Ham on 08/01/2016 15:17:40 [name], [name] [name] ([id number]) -------------------------------------------------------------------------------- SuperBill Details [name], Callan Date of Service: 08/01/2016 [name] Patient Account Number: [id number] Medical Record Treating RN: Cornell Barman [id number] Number: Other Clinician: Date of Birth/Sex: Nov 24, 1931 (83 y.o. 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 E11.51 Type 2 diabetes mellitus with diabetic peripheral angiopathy  without gangrene E11.42 Type 2 diabetes mellitus with diabetic polyneuropathy Facility Procedures CPT4 Code: [id number] Description: 02585 - WOUND CARE VISIT-LEV 2 EST PT Modifier: Quantity: 1 Physician Procedures CPT4: Description Modifier Quantity Code [id number] - WC PHYS LEVEL 3 - EST PT 1 ICD-10 Description Diagnosis E11.621 Type 2 diabetes mellitus with foot ulcer 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

## 2016-08-03 DIAGNOSIS — L89612 Pressure ulcer of right heel, stage 2: Secondary | ICD-10-CM | POA: Diagnosis not present

## 2016-08-03 DIAGNOSIS — I69398 Other sequelae of cerebral infarction: Secondary | ICD-10-CM | POA: Diagnosis not present

## 2016-08-03 DIAGNOSIS — S91114D Laceration without foreign body of right lesser toe(s) without damage to nail, subsequent encounter: Secondary | ICD-10-CM | POA: Diagnosis not present

## 2016-08-03 DIAGNOSIS — E1122 Type 2 diabetes mellitus with diabetic chronic kidney disease: Secondary | ICD-10-CM | POA: Diagnosis not present

## 2016-08-03 DIAGNOSIS — M6281 Muscle weakness (generalized): Secondary | ICD-10-CM | POA: Diagnosis not present

## 2016-08-03 DIAGNOSIS — I5042 Chronic combined systolic (congestive) and diastolic (congestive) heart failure: Secondary | ICD-10-CM | POA: Diagnosis not present

## 2016-08-03 DIAGNOSIS — I110 Hypertensive heart disease with heart failure: Secondary | ICD-10-CM | POA: Diagnosis not present

## 2016-08-03 DIAGNOSIS — E1151 Type 2 diabetes mellitus with diabetic peripheral angiopathy without gangrene: Secondary | ICD-10-CM | POA: Diagnosis not present

## 2016-08-03 DIAGNOSIS — N183 Chronic kidney disease, stage 3 (moderate): Secondary | ICD-10-CM | POA: Diagnosis not present

## 2016-08-07 DIAGNOSIS — I110 Hypertensive heart disease with heart failure: Secondary | ICD-10-CM | POA: Diagnosis not present

## 2016-08-07 DIAGNOSIS — I5042 Chronic combined systolic (congestive) and diastolic (congestive) heart failure: Secondary | ICD-10-CM | POA: Diagnosis not present

## 2016-08-07 DIAGNOSIS — N183 Chronic kidney disease, stage 3 (moderate): Secondary | ICD-10-CM | POA: Diagnosis not present

## 2016-08-07 DIAGNOSIS — E1122 Type 2 diabetes mellitus with diabetic chronic kidney disease: Secondary | ICD-10-CM | POA: Diagnosis not present

## 2016-08-07 DIAGNOSIS — S91114D Laceration without foreign body of right lesser toe(s) without damage to nail, subsequent encounter: Secondary | ICD-10-CM | POA: Diagnosis not present

## 2016-08-07 DIAGNOSIS — E1151 Type 2 diabetes mellitus with diabetic peripheral angiopathy without gangrene: Secondary | ICD-10-CM | POA: Diagnosis not present

## 2016-08-07 DIAGNOSIS — L89612 Pressure ulcer of right heel, stage 2: Secondary | ICD-10-CM | POA: Diagnosis not present

## 2016-08-07 DIAGNOSIS — I69398 Other sequelae of cerebral infarction: Secondary | ICD-10-CM | POA: Diagnosis not present

## 2016-08-07 DIAGNOSIS — M6281 Muscle weakness (generalized): Secondary | ICD-10-CM | POA: Diagnosis not present

## 2016-08-08 ENCOUNTER — Encounter: Payer: Self-pay | Admitting: Cardiovascular Disease

## 2016-08-08 ENCOUNTER — Encounter: Payer: Medicare HMO | Attending: Internal Medicine | Admitting: Internal Medicine

## 2016-08-08 ENCOUNTER — Ambulatory Visit (INDEPENDENT_AMBULATORY_CARE_PROVIDER_SITE_OTHER): Payer: Medicare HMO | Admitting: Cardiovascular Disease

## 2016-08-08 ENCOUNTER — Other Ambulatory Visit
Admission: RE | Admit: 2016-08-08 | Discharge: 2016-08-08 | Disposition: A | Discharge status: Home / Self Care | Payer: Medicare HMO | Source: Ambulatory Visit | Attending: Cardiovascular Disease | Admitting: Cardiovascular Disease

## 2016-08-08 VITALS — BP 132/60 | HR 60 | Ht 63.0 in | Wt 203.0 lb

## 2016-08-08 DIAGNOSIS — Z01812 Encounter for preprocedural laboratory examination: Secondary | ICD-10-CM | POA: Diagnosis not present

## 2016-08-08 DIAGNOSIS — E1142 Type 2 diabetes mellitus with diabetic polyneuropathy: Secondary | ICD-10-CM | POA: Insufficient documentation

## 2016-08-08 DIAGNOSIS — I5032 Chronic diastolic (congestive) heart failure: Secondary | ICD-10-CM

## 2016-08-08 DIAGNOSIS — E11621 Type 2 diabetes mellitus with foot ulcer: Secondary | ICD-10-CM | POA: Insufficient documentation

## 2016-08-08 DIAGNOSIS — I2510 Atherosclerotic heart disease of native coronary artery without angina pectoris: Secondary | ICD-10-CM | POA: Diagnosis not present

## 2016-08-08 DIAGNOSIS — E1151 Type 2 diabetes mellitus with diabetic peripheral angiopathy without gangrene: Secondary | ICD-10-CM | POA: Insufficient documentation

## 2016-08-08 DIAGNOSIS — M1990 Unspecified osteoarthritis, unspecified site: Secondary | ICD-10-CM | POA: Insufficient documentation

## 2016-08-08 DIAGNOSIS — J449 Chronic obstructive pulmonary disease, unspecified: Secondary | ICD-10-CM | POA: Insufficient documentation

## 2016-08-08 DIAGNOSIS — I872 Venous insufficiency (chronic) (peripheral): Secondary | ICD-10-CM | POA: Insufficient documentation

## 2016-08-08 DIAGNOSIS — Z794 Long term (current) use of insulin: Secondary | ICD-10-CM | POA: Insufficient documentation

## 2016-08-08 DIAGNOSIS — N183 Chronic kidney disease, stage 3 (moderate): Secondary | ICD-10-CM | POA: Insufficient documentation

## 2016-08-08 DIAGNOSIS — E1122 Type 2 diabetes mellitus with diabetic chronic kidney disease: Secondary | ICD-10-CM | POA: Insufficient documentation

## 2016-08-08 DIAGNOSIS — K219 Gastro-esophageal reflux disease without esophagitis: Secondary | ICD-10-CM | POA: Insufficient documentation

## 2016-08-08 DIAGNOSIS — I739 Peripheral vascular disease, unspecified: Secondary | ICD-10-CM | POA: Diagnosis not present

## 2016-08-08 DIAGNOSIS — I110 Hypertensive heart disease with heart failure: Secondary | ICD-10-CM | POA: Diagnosis not present

## 2016-08-08 DIAGNOSIS — S91104A Unspecified open wound of right lesser toe(s) without damage to nail, initial encounter: Secondary | ICD-10-CM | POA: Diagnosis not present

## 2016-08-08 DIAGNOSIS — L97323 Non-pressure chronic ulcer of left ankle with necrosis of muscle: Secondary | ICD-10-CM | POA: Diagnosis not present

## 2016-08-08 DIAGNOSIS — I129 Hypertensive chronic kidney disease with stage 1 through stage 4 chronic kidney disease, or unspecified chronic kidney disease: Secondary | ICD-10-CM | POA: Diagnosis not present

## 2016-08-08 DIAGNOSIS — I69398 Other sequelae of cerebral infarction: Secondary | ICD-10-CM | POA: Diagnosis not present

## 2016-08-08 DIAGNOSIS — S91301A Unspecified open wound, right foot, initial encounter: Secondary | ICD-10-CM | POA: Diagnosis not present

## 2016-08-08 DIAGNOSIS — I5042 Chronic combined systolic (congestive) and diastolic (congestive) heart failure: Secondary | ICD-10-CM | POA: Diagnosis not present

## 2016-08-08 DIAGNOSIS — E11622 Type 2 diabetes mellitus with other skin ulcer: Secondary | ICD-10-CM | POA: Diagnosis not present

## 2016-08-08 DIAGNOSIS — L97411 Non-pressure chronic ulcer of right heel and midfoot limited to breakdown of skin: Secondary | ICD-10-CM | POA: Insufficient documentation

## 2016-08-08 DIAGNOSIS — L89612 Pressure ulcer of right heel, stage 2: Secondary | ICD-10-CM | POA: Diagnosis not present

## 2016-08-08 DIAGNOSIS — M6281 Muscle weakness (generalized): Secondary | ICD-10-CM | POA: Diagnosis not present

## 2016-08-08 DIAGNOSIS — S91114D Laceration without foreign body of right lesser toe(s) without damage to nail, subsequent encounter: Secondary | ICD-10-CM | POA: Diagnosis not present

## 2016-08-08 LAB — BASIC METABOLIC PANEL
Anion gap: 8 (ref 5–15)
BUN: 20 mg/dL (ref 6–20)
CALCIUM: 9.2 mg/dL (ref 8.9–10.3)
CO2: 28 mmol/L (ref 22–32)
Chloride: 102 mmol/L (ref 101–111)
Creatinine, Ser: 1.17 mg/dL — ABNORMAL HIGH (ref 0.44–1.00)
GFR calc Af Amer: 49 mL/min — ABNORMAL LOW (ref >60)
GFR, EST NON AFRICAN AMERICAN: 42 mL/min — AB (ref >60)
GLUCOSE: 209 mg/dL — AB (ref 65–99)
Potassium: 3.6 mmol/L (ref 3.5–5.1)
Sodium: 138 mmol/L (ref 135–145)

## 2016-08-08 LAB — PROTIME-INR
INR: 1.05
PROTHROMBIN TIME: 13.7 s (ref 11.4–15.2)

## 2016-08-08 LAB — CBC
HCT: 38.2 % (ref 35.0–47.0)
Hemoglobin: 12.7 g/dL (ref 12.0–16.0)
MCH: 27.7 pg (ref 26.0–34.0)
MCHC: 33.3 g/dL (ref 32.0–36.0)
MCV: 83.1 fL (ref 80.0–100.0)
Platelets: 238 10*3/uL (ref 150–440)
RBC: 4.6 MIL/uL (ref 3.80–5.20)
RDW: 18.1 % — AB (ref 11.5–14.5)
WBC: 11.2 10*3/uL — ABNORMAL HIGH (ref 3.6–11.0)

## 2016-08-08 NOTE — Patient Instructions (Addendum)
Medication Instructions:  Your physician recommends that you continue on your current medications as directed. Please refer to the Current Medication list given to you today.   Labwork: BMET, CBC, PT/INR at the [facility] lab. No appointment necessary.  Testing/Procedures: Abdominal aortogram with lower extremity angiography Wednesday, July 11 [facility] Entrance A, Short Stay [address] [place] [phone]  Nothing to eat or drink after midnight the evening before your procedure. Do not take lasix the morning of your procedure. Take 1/2 dose insulin the evening before. Do not take any insulin the morning of the procedure.  Please arrive at 7am in Short Stay for hydration.     Follow-Up: Your physician recommends that you schedule a follow-up appointment in: one month with Dr. Fletcher Anon.    Any Other Special Instructions Will Be Listed Below (If Applicable).     If you need a refill on your cardiac medications before your next appointment, please call your pharmacy.

## 2016-08-08 NOTE — Progress Notes (Signed)
Cardiology Office Note   Date:  08/08/2016   ID:  [name] [name], DOB September 27, 1931, MRN [id number]  PCP:  Philmore Pali, NP  Cardiologist:  Dr. Gwenlyn Found   Chief Complaint  Patient presents with  . other    PV consult per Dr. Dellia Nims from the [facility]. Pt. c/o a wound on her right heel for about a month and a half. Meds reviewed by the pt. verbally.       History of Present Illness: [name] [name] is a 83 y.o. female who was referred by Dr. Dellia Nims for evaluation of nonhealing ulcer on the right foot with known history of peripheral arterial disease. She has extensive medical problems that include coronary artery disease status post CABG in 2008, hypertension, chronic kidney disease, hyperlipidemia, chronic diastolic heart failure and prior CVA. She is mostly wheelchair bound and her mobility is very limited. She was diagnosed with endometrial cancer earlier this year and was treated at [facility]. Plavix was discontinued at that time due to vaginal bleeding which has mostly resolved since then. The patient has known history of peripheral arterial disease with previous critical limb ischemia affecting the left lower extremity. She was treated by Dr. dew in 2014 with balloon angioplasty of the left SFA, popliteal artery and TP trunk.  She developed right heel ulceration about 2 months  ago which has not improved with wound care. She also noted recent small ulceration between the fourth and fifth toes on the right. This was followed by development of smaller left heel ulceration. She underwent noninvasive vascular studies in our office recently which showed an ABI of 0.52 on the right and 0.83 on the left.  Duplex showed probable right mid SFA occlusion and occluded left TP trunk with 1 vessel runoff below the knee.   Past Medical History:  Diagnosis Date  . CAD (coronary artery disease) 2008   a. s/p CABG in 2008 with LIMA-LAD, SVG-D1-OM1, and SVG-PDA  . CHF (congestive heart  failure) ([place])   . Claudication in peripheral vascular disease ([place]) 05/2012    ABI right 0.81; left 0.98   . CVA (cerebral infarction) 2014  . Daytime somnolence     hoping CPAP will be able to help this  . Diabetes mellitus type 2 with complications, uncontrolled ([facility])    CAD, CVA  . H/O cardiovascular stress test 06/2012   Stable with lateral scar and moderate. Infarction ischemia  . H/O gastritis   . HTN (hypertension)   . Hyperlipidemia LDL goal < 70   . Mallory-Weiss tear    History of  . Obesity (BMI 30.0-34.9)   . OSA on CPAP 2014    patient is just now beginning CPAP    Past Surgical History:  Procedure Laterality Date  . CHOLECYSTECTOMY    . CORONARY ARTERY BYPASS GRAFT  12/31/2006   CABG x4, LIMA-LAD, sequential VG-diagonal branch of LAD, OM of left circumflex & PLA branch of RCA     Current Outpatient Prescriptions  Medication Sig Dispense Refill  . ACCU-CHEK SOFTCLIX LANCETS lancets CHECK BLOOD SUGARS TWICE DAILY 200 each 12  . aspirin 81 MG tablet Take 81 mg by mouth daily.    . bisacodyl (DULCOLAX) 5 MG EC tablet Take 5 mg by mouth daily as needed for moderate constipation.    . carvedilol (COREG) 6.25 MG tablet Take 6.25 mg by mouth 2 (two) times daily with a meal.    . furosemide (LASIX) 40 MG tablet TAKE 2  TABLETS BY MOUTH EVERY MORNING FOR FLUID 60 tablet 6  . insulin aspart (NOVOLOG FLEXPEN) 100 UNIT/ML FlexPen Inject into the skin 3 (three) times daily with meals. Sliding Scale 2 units-6 units patient has list for 100-400 BS readings  100-200, 2 units, 201-250, 4 units, 251--300 6 units, 301-350, 8 units, 351 to 400 10 units.    . Insulin NPH Isophane & Regular (NOVOLIN 70/30 Hurt) Inject 40-50 Units into the skin. 50 units every morning  50 units every evening    . Insulin Pen Needle (RELION SHORT PEN NEEDLES) 31G X 8 MM MISC 1 each by Does not apply route 2 (two) times daily. 50 each 11  . lisinopril (PRINIVIL,ZESTRIL) 40 MG tablet TAKE 1 TABLET DAILY  (Patient not taking: Reported on 08/02/2016) 30 tablet 0  . medroxyPROGESTERone (PROVERA) 5 MG tablet Take 5 mg by mouth daily.    . polyethylene glycol (MIRALAX / GLYCOLAX) packet USE 1-2 PACKETS DAILY 60 packet 0  . pravastatin (PRAVACHOL) 40 MG tablet TAKE 1 TABLET BY MOUTH AT BEDTIME 30 tablet 3  . ranitidine (ZANTAC) 150 MG tablet Take 150 mg by mouth 2 (two) times daily.     No current facility-administered medications for this visit.     Allergies:   Patient has no known allergies.    Social History:  The patient  reports that she has quit smoking. She has never used smokeless tobacco. She reports that she does not drink alcohol or use drugs.   Family History:  The patient's family history includes Diabetes in her brother and sister; Stroke in her mother.    ROS:  Please see the history of present illness.   Otherwise, review of systems are positive for none.   All other systems are reviewed and negative.    PHYSICAL EXAM: VS:  BP 132/60 (BP Location: Right Arm, Patient Position: Sitting, Cuff Size: Large)   Pulse 60   Ht 5\' 3"  (1.6 m)   Wt 203 lb (92.1 kg)   BMI 35.96 kg/m  , BMI Body mass index is 35.96 kg/m. GEN: Well nourished, well developed, in no acute distress  HEENT: normal  Neck: no JVD, carotid bruits, or masses Cardiac: RRR; no  rubs, or gallops mild  edema . 2/6 systolic ejection murmur at the aortic area.  Respiratory:  clear to auscultation bilaterally, normal work of breathing GI: soft, nontender, nondistended, + BS MS: no deformity or atrophy  Skin: warm and dry, no rash Neuro:  Strength and sensation are intact Psych: euthymic mood, full affect The patient just came back from the wound clinic and her legs were just wrapped.  Distal pulses are not palpable.  EKG:  EKG is not ordered today.    Recent Labs: 07/03/2016: ALT 12; BUN 30; Creatinine, Ser 1.82; Hemoglobin 11.6; Platelets 313; Potassium 4.1; Sodium 139    Lipid Panel    Component Value  Date/Time   CHOL 142 12/11/2014 1005   CHOL 102 12/12/2012 0419   TRIG 95 12/11/2014 1005   TRIG 65 12/12/2012 0419   HDL 37 (L) 12/11/2014 1005   HDL 32 (L) 12/12/2012 0419   CHOLHDL 3.8 12/11/2014 1005   VLDL 13 12/12/2012 0419   LDLCALC 86 12/11/2014 1005   LDLCALC 57 12/12/2012 0419      Wt Readings from Last 3 Encounters:  08/08/16 203 lb (92.1 kg)  07/03/16 203 lb (92.1 kg)  04/27/16 203 lb 8 oz (92.3 kg)        PAD  Screen 08/08/2016  Previous surgical procedure? Yes  Dates of procedures stent in right leg at [facility]   Pain with walking? No  Subsides with rest? No  Feet/toe relief with dangling? Yes  Painful, non-healing ulcers? Yes  Extremities discolored? No      ASSESSMENT AND PLAN:  1.  Peripheral arterial disease with critical limb ischemia affecting both legs worse on the right side: The patient is at high risk for limb loss and amputation considering her comorbidities and significant peripheral arterial disease. I discussed different management options and recommend proceeding with angiography and possible endovascular intervention. She is not a candidate for any surgical revascularization. Even endovascular intervention is going to be associated with high risk of complications considering her comorbidities and chronic kidney disease with risk of contrast-induced nephropathy. This was explained to the patient and her family. We will have to bring her early for hydration and consider using CO2. We will focus on treating the right leg first which is worse. Then we might consider staged angiography of the left lower extremity.  2. Coronary artery disease involving native coronary arteries without angina: Continue medical therapy.  3. Chronic diastolic heart failure: Hold a 6 the morning of the procedure.  4. Chronic kidney disease, most recent creatinine was 1.82. Minimize contrast use and hydrated before and after.   Disposition:   FU with me in 1  month  Signed,  Kathlyn Sacramento, MD  08/08/2016 5:11 PM    [place]

## 2016-08-09 NOTE — Progress Notes (Addendum)
[name] [name], [name] [name] ([id number]) Visit Report for 08/08/2016 Chief Complaint Document Details [name] [name], [name] [name] Date of Service: 08/08/2016 3:30 PM [name] Patient Account Number: [id number] Medical Record Treating RN: Cornell Barman [id number] Number: Other Clinician: Date of Birth/Sex: [birth year] (83 y.o. Female) Treating Guenevere Roorda Primary Care Provider: LAM, LYNN Provider/Extender: G Referring Provider: Waynette Buttery in Treatment: 2 Information Obtained from: Patient Chief Complaint 07/19/16; patient is here for review of a wound on the right heel Electronic Signature(s) Signed: 08/08/2016 5:35:48 PM By: Linton Ham MD Entered By: Linton Ham on 08/08/2016 17:02:50 [name] [name], [name] [name] ([id number]) -------------------------------------------------------------------------------- Debridement Details [name] [name], [name] [name] Date of Service: 08/08/2016 3:30 PM [name] Patient Account Number: [id number] Medical Record Treating RN: Cornell Barman [id number] Number: Other Clinician: Date of Birth/Sex: 1931/08/08 (83 y.o. Female) Treating Tiney Zipper Primary Care Provider: LAM, LYNN Provider/Extender: G Referring Provider: LAM, LYNN Weeks in Treatment: 2 Debridement Performed for Wound #2 Left,Medial Malleolus Assessment: Performed By: Physician Ricard Dillon, MD Debridement: Debridement Severity of Tissue Pre Muscle involvement without necrosis Debridement: Pre-procedure Verification/Time Out Yes - 04:00 Taken: Start Time: 04:01 Total Area Debrided (L x 2 (cm) x 2.5 (cm) = 5 (cm) W): Tissue and other Fibrin/Slough, Subcutaneous material debrided: Instrument: Curette Bleeding: Minimum Hemostasis Achieved: Pressure End Time: 04:02 Procedural Pain: 6 Post Procedural Pain: 2 Response to Treatment: Procedure was tolerated well Post Debridement Measurements of Total Wound Length: (cm) 2 Width: (cm) 2.5 Depth: (cm) 0.1 Volume: (cm)  0.393 Character of Wound/Ulcer Post Improved Debridement: Severity of Tissue Post Debridement: Necrosis of muscle Post Procedure Diagnosis Same as Pre-procedure Electronic Signature(s) Signed: 08/11/2016 10:54:33 AM By: Gretta Cool, BSN, RN, CWS, Kim RN, BSN Signed: 08/22/2016 12:40:58 PM By: Linton Ham MD Previous Signature: 08/08/2016 5:13:54 PM Version By: Gretta Cool BSN, RN, CWS, Kim RN, BSN [name] [name], [name] [name] ([id number]) Previous Signature: 08/08/2016 5:35:48 PM Version By: Linton Ham MD Entered By: Gretta Cool, BSN, RN, CWS, Kim on 08/11/2016 10:54:33 [name] [name], [name] [name] ([id number]) -------------------------------------------------------------------------------- HPI Details [name] [name], [name] [name] Date of Service: 08/08/2016 3:30 PM [name] Patient Account Number: [id number] Medical Record Treating RN: Cornell Barman [id number] Number: Other Clinician: Date of Birth/Sex: [birth year] (83 y.o. Female) Treating Daksha Koone Primary Care Provider: LAM, LYNN Provider/Extender: G Referring Provider: Waynette Buttery in Treatment: 2 History of Present Illness HPI Description: 07/19/16; this is an 83 year old woman who tells me that she has had a wound on her right heel for about a month and a half. She said she was hospitalized with postmenopausal vaginal bleeding and was hospitalized ultimately at [facility] for this. I do not have these records. Afterward she was sent to [place] for rehabilitation. She states there she developed a wound on her foot. The patient is a type II diabetic on insulin, I'm not really sure of her diabetic control. She has well care or home health we think they are putting Silvadene cream on this. ABIs in our clinic were 0.6 bilaterally. The patient and her husband it was somewhat vague history of possible stent placement in the right thigh 2-3 years ago although I don't see anything about this and [place] link. We will need to research this more  before we see her again next week. She did have a normal x-ray of the heel at the end of May I believe when she was seen in the ER. There was no evidence of osteomyelitis although soft tissue edema was noted. Looking through care everywhere and [place] link I can see no  reference to vascular surgery, noninvasive vascular tests. It is possible that this was non-at the local vein and vascular Center before they became part of the network. This would be Dr. Lucky Cowboy and Dr. Delana Meyer we will check with their office 07/26/16; the patient's arterial studies were really not very good she has an ABI in the right of 0.52 and a TBI on the right of 0.34. Corresponding values on the left for better at 0.83 on the left and a TBI of 0.54. She was felt to have right mid SFA occlusion and a 50-74% long segment stenosis and diffuse 30-49% left femoral popliteal disease with an occluded left tibial peroneal trunk. In spite of this her wound on her right heel appears to be a lot better today. Smaller with a healthy granulated surface. I have research notes in [place]. The patient is indeed followed by Dr. Gwenlyn Found of cardiology for coronary artery disease status post coronary artery bypass grafting about 7 years ago. He also notes a history of PAD and procedures by Dr. dew in [place]. In spite of this the patient's wound is better today. She is nonambulatory does not complain of pain or claudication and really I don't think given everything that she requires an arteriogram or an invasive arterial evaluation. 08/01/16; in spite of her likely significant PAD the patient's wound continues to contract. As long as this is the case I am going to forego additional arterial consultation. We have been using silver alginate. She is down 0.4 cm in length today 08/08/16; patient has an appointment with Dr. Fletcher Anon today in follow-up for her PAD. Vascular studies that we did last month I think are the cause of this. She has a  new wound on her lateral right fifth toe and an unruptured blister on the medial left heel. Her major wound on the right heel is at 1.5 x 1.5 x 0.1 Electronic Signature(s) GRACYNN, RAJEWSKI ([id number]) Signed: 08/08/2016 5:35:48 PM By: Linton Ham MD Entered By: Linton Ham on 08/08/2016 17:05:26 [name] [name], [name] [name] ([id number]) -------------------------------------------------------------------------------- Physical Exam Details [name] [name], Ronnika Date of Service: 08/08/2016 3:30 PM [name] Patient Account Number: [id number] Medical Record Treating RN: Cornell Barman [id number] Number: Other Clinician: Date of Birth/Sex: Apr 17, 1931 (83 y.o. Female) Treating Tymon Nemetz Primary Care Provider: LAM, LYNN Provider/Extender: G Referring Provider: LAM, LYNN Weeks in Treatment: 2 Constitutional Patient is hypertensive.. Pulse regular and within target range for patient.[name] Respirations regular, non-labored and within target range.. Temperature is normal and within the target range for the patient.[name] appears in no distress. Eyes Conjunctivae clear. No discharge. Respiratory Respiratory effort is easy and symmetric bilaterally. Rate is normal at rest and on room air.. Cardiovascular Pedal pulses absent bilaterally.. Lymphatic None palpable in the popliteal or inguinal area. Musculoskeletal No obvious joint involvement and the fifth toe. Psychiatric No evidence of depression, anxiety, or agitation. Calm, cooperative, and communicative. Appropriate interactions and affect.. Notes Wound exam; oThe patient's wound is on the tip of her right heel using a #3 curet adherent necrotic material was the provided hemostasis with direct pressure. oShe has a new wound on the medial right fifth toe this is deep but does not probe to bone oIntact blister on the left medial heel Electronic Signature(s) Signed: 08/08/2016 5:35:48 PM By: Linton Ham MD Entered By: Linton Ham on 08/08/2016 17:06:36 [name] [name], [name] [name] ([id number]) -------------------------------------------------------------------------------- Physician Orders Details [name] [name], [name] [name] Date of Service: 08/08/2016 3:30 PM [name] Patient Account Number: [id number] Medical Record Treating RN:  Cornell Barman [id number] Number: Other Clinician: Date of Birth/Sex: Oct 04, 1931 (83 y.o. Female) Treating Donye Dauenhauer Primary Care Provider: LAM, LYNN Provider/Extender: G Referring Provider: LAM, LYNN Weeks in Treatment: 2 Verbal / Phone Orders: No Diagnosis Coding Wound Cleansing Wound #1 Right Calcaneus o Cleanse wound with mild soap and water o May Shower, gently pat wound dry prior to applying new dressing. Wound #2 Left,Medial Malleolus o Cleanse wound with mild soap and water o May Shower, gently pat wound dry prior to applying new dressing. Wound #3 Right,Medial Toe Fifth o Cleanse wound with mild soap and water o May Shower, gently pat wound dry prior to applying new dressing. Anesthetic Wound #1 Right Calcaneus o Topical Lidocaine 4% cream applied to wound bed prior to debridement - in clinic Wound #2 Left,Medial Malleolus o Topical Lidocaine 4% cream applied to wound bed prior to debridement - in clinic Wound #3 Right,Medial Toe Fifth o Topical Lidocaine 4% cream applied to wound bed prior to debridement - in clinic Primary Wound Dressing Wound #1 Right Calcaneus o Aquacel Ag Wound #2 Left,Medial Malleolus o Aquacel Ag Wound #3 Right,Medial Toe Fifth o Aquacel Ag Secondary Dressing Wound #1 Right Calcaneus [name] [name], [name] ([id number]) o Gauze and Kerlix/Conform o Other - heel pads Wound #2 Left,Medial Malleolus o Gauze and Kerlix/Conform o Other - heel pads Wound #3 Right,Medial Toe Fifth o Gauze and Kerlix/Conform o Other - heel pads Dressing Change Frequency Wound #1 Right Calcaneus o Change Dressing  Monday, Wednesday, Friday - she comes to the wound clinic wednesdays Follow-up Appointments Wound #1 Right Calcaneus o Return Appointment in 1 week. Edema Control Wound #1 Right Calcaneus o Elevate legs to the level of the heart and pump ankles as often as possible Off-Loading Wound #1 Right Calcaneus o Other: - elevated legs on pillows Additional Orders / Instructions Wound #1 Right Calcaneus o Increase protein intake. Home Health Wound #1 Right [place] Visits - [place] Nurse may visit PRN to address patientos wound care needs. o FACE TO FACE ENCOUNTER: MEDICARE and MEDICAID PATIENTS: I certify that this patient is under my care and that I had a face-to-face encounter that meets the physician face-to-face encounter requirements with this patient on this date. The encounter with the patient was in whole or in part for the following MEDICAL CONDITION: (primary reason for [place]) MEDICAL NECESSITY: I certify, that based on my findings, NURSING services are a medically necessary home health service. HOME BOUND STATUS: I certify that my clinical findings support that this patient is homebound (i.e., Due to illness or injury, pt requires aid of supportive devices such as crutches, cane, wheelchairs, walkers, the use of special transportation or the assistance of another person to leave their place of residence. There is a normal inability to leave the home and doing so requires considerable and taxing effort. Other absences are for medical reasons / religious services and are infrequent or of short duration when for other reasons). [name] [name], ALSHA MELAND ([id number]) o If current dressing causes regression in wound condition, may D/C ordered dressing product/s and apply Normal Saline Moist Dressing daily until next [place] / Other MD appointment. [place] of regression in wound condition at  [phone]. o Please direct any NON-WOUND related issues/requests for orders to patient's Primary Care Physician Electronic Signature(s) Signed: 08/08/2016 3:55:06 PM By: Gretta Cool, BSN, RN, CWS, Kim RN, BSN Signed: 08/08/2016 5:35:48 PM By: Linton Ham MD Entered By: Gretta Cool, BSN,  RN, CWS, Kim on 08/08/2016 15:50:52 [name] [name], [name] [name] ([id number]) -------------------------------------------------------------------------------- Problem List Details [name] [name], Karle Date of Service: 08/08/2016 3:30 PM [name] Patient Account Number: [id number] Medical Record Treating RN: Cornell Barman [id number] Number: Other Clinician: Date of Birth/Sex: 1931-12-19 (83 y.o. 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 Patient Account Number: [id number] Medical Record Treating RN: Cornell Barman [id number] Number: Other Clinician: Date of Birth/Sex: [birth year] (83 y.o. Female) Treating Jeffey Janssen Primary Care Provider: LAM, Jeani Hawking Provider/Extender: G Referring Provider: Waynette Buttery in Treatment:  2 Subjective Chief Complaint Information obtained from Patient 07/19/16; patient is here for review of a wound on the right heel History of Present Illness (HPI) 07/19/16; this is an 83 year old woman who tells me that she has had a wound on her right heel for about a month and a half. She said she was hospitalized with postmenopausal vaginal bleeding and was hospitalized ultimately at [facility] for this. I do not have these records. Afterward she was sent to [place] for rehabilitation. She states there she developed a wound on her foot. The patient is a type II diabetic on insulin, I'm not really sure of her diabetic control. She has well care or home health we think they are putting Silvadene cream on this. ABIs in our clinic were 0.6 bilaterally. The patient and her husband it was somewhat vague history of possible stent placement in the right thigh 2-3 years ago although I don't see anything about this and [place] link. We will need to research this more before we see her again next week. She did have a normal x-ray of the heel at the end of May I believe when she was seen in the ER. There was no evidence of osteomyelitis although soft tissue edema was noted. Looking through care everywhere and [place] link I can see no reference to vascular surgery, noninvasive vascular tests. It is possible that this was non-at the local vein and vascular Center before they became part of the network. This would be Dr. Lucky Cowboy and Dr. Delana Meyer we will check with their office 07/26/16; the patient's arterial studies were really not very good she has an ABI in the right of 0.52 and a TBI on the right of 0.34. Corresponding values on the left for better at 0.83 on the left and a TBI of 0.54. She was felt to have right mid SFA occlusion and a 50-74% long segment stenosis and diffuse 30-49% left femoral popliteal disease with an occluded left tibial peroneal trunk. In spite of this her wound on her  right heel appears to be a lot better today. Smaller with a healthy granulated surface. I have research notes in [place]. The patient is indeed followed by Dr. Gwenlyn Found of cardiology for coronary artery disease status post coronary artery bypass grafting about 7 years ago. He also  notes a history of PAD and procedures by Dr. dew in [place]. In spite of this the patient's wound is better today. She is nonambulatory does not complain of pain or claudication and really I don't think given everything that she requires an arteriogram or an invasive arterial evaluation. 08/01/16; in spite of her likely significant PAD the patient's wound continues to contract. As long as this is the case I am going to forego additional arterial consultation. We have been using silver alginate. She is down 0.4 cm in length today [name] [name], IRETTA MANGRUM ([id number]) 08/08/16; patient has an appointment with Dr. Fletcher Anon today in follow-up for her PAD. Vascular studies that we did last month I think are the cause of this. She has a new wound on her lateral right fifth toe and an unruptured blister on the medial left heel. Her major wound on the right heel is at 1.5 x 1.5 x 0.1 Objective Constitutional Patient is hypertensive.. Pulse regular and within target range for patient.[name] Respirations regular, non-labored and within target range.. Temperature is normal and within the target range for the patient.[name] appears in no distress. Vitals Time Taken: 3:04 PM, Height: 63 in, Weight: 203 lbs, BMI: 36, Temperature: 98.0 F, Pulse: 61 bpm, Respiratory Rate: 16 breaths/min, Blood Pressure: 178/61 mmHg. Eyes Conjunctivae clear. No discharge. Respiratory Respiratory effort is easy and symmetric bilaterally. Rate is normal at rest and on room air.. Cardiovascular Pedal pulses absent bilaterally.. Lymphatic None palpable in the popliteal or inguinal area. Musculoskeletal No obvious joint involvement and the fifth  toe. Psychiatric No evidence of depression, anxiety, or agitation. Calm, cooperative, and communicative. Appropriate interactions and affect.. General Notes: Wound exam; The patient's wound is on the tip of her right heel using a #3 curet adherent necrotic material was the provided hemostasis with direct pressure. She has a new wound on the medial right fifth toe this is deep but does not probe to bone Intact blister on the left medial heel Integumentary (Hair, Skin) Wound #1 status is Open. Original cause of wound was Gradually Appeared. The wound is located on the Right Calcaneus. The wound measures 1.5cm length x 1.5cm width x 0.1cm depth; 1.767cm^2 area and 0.177cm^3 volume. There is Fat Layer (Subcutaneous Tissue) Exposed exposed. There is no tunneling [name] [name], Dayanara C. ([id number]) noted. There is a large amount of serous drainage noted. The wound margin is distinct with the outline attached to the wound base. There is medium (34-66%) pale granulation within the wound bed. There is a medium (34-66%) amount of necrotic tissue within the wound bed including Adherent Slough. The periwound skin appearance exhibited: Dry/Scaly. The periwound skin appearance did not exhibit: Callus, Crepitus, Excoriation, Induration, Rash, Scarring, Maceration, Atrophie Blanche, Cyanosis, Ecchymosis, Hemosiderin Staining, Mottled, Pallor, Rubor, Erythema. Periwound temperature was noted as No Abnormality. Wound #2 status is Open. Original cause of wound was Blister. The wound is located on the Left,Medial Malleolus. The wound measures 2cm length x 2.5cm width x 0.1cm depth; 3.927cm^2 area and 0.393cm^3 volume. The wound is limited to skin breakdown. There is no tunneling or undermining noted. There is a none present amount of drainage noted. The wound margin is flat and intact. There is large (67-100%) granulation within the wound bed. General Notes: Fluid Filled Bilister Wound #3 status is Open.  Original cause of wound was Pressure Injury. The wound is located on the Right,Medial Toe Fifth. The wound measures 0.4cm length x 0.5cm width x 0.9cm depth; 0.157cm^2 area and 0.141cm^3 volume. There is Fat Layer (  Subcutaneous Tissue) Exposed exposed. There is a medium amount of serous drainage noted. The wound margin is flat and intact. There is no granulation within the wound bed. There is a large (67-100%) amount of necrotic tissue within the wound bed including Adherent Slough. Assessment Active Problems ICD-10 E11.621 - Type 2 diabetes mellitus with foot ulcer L97.411 - Non-pressure chronic ulcer of right heel and midfoot limited to breakdown of skin E11.51 - Type 2 diabetes mellitus with diabetic peripheral angiopathy without gangrene E11.42 - Type 2 diabetes mellitus with diabetic polyneuropathy Procedures Wound #2 Pre-procedure diagnosis of Wound #2 is a Diabetic Wound/Ulcer of the Lower Extremity located on the Left,Medial Malleolus .Severity of Tissue Pre Debridement is: Muscle involvement without necrosis. There was a Debridement ([id number]) debridement with total area of 5 sq cm performed by Ricard Dillon, MD. with the following instrument(s): Curette including Fibrin/Slough and Subcutaneous. A time out was conducted at 04:00, prior to the start of the procedure. A Minimum amount of bleeding was controlled with Pressure. The procedure was tolerated well with a pain level of 6 throughout and a pain level of 2 following the procedure. Post Debridement Measurements: 2cm length x 2.5cm width x 0.1cm depth; [name] [name], Pierina C. ([id number]) 0.393cm^3 volume. Character of Wound/Ulcer Post Debridement is improved. Severity of Tissue Post Debridement is: Necrosis of muscle. Post procedure Diagnosis Wound #2: Same as Pre-Procedure Plan Wound Cleansing: Wound #1 Right Calcaneus: Cleanse wound with mild soap and water May Shower, gently pat wound dry prior to applying new  dressing. Wound #2 Left,Medial Malleolus: Cleanse wound with mild soap and water May Shower, gently pat wound dry prior to applying new dressing. Wound #3 Right,Medial Toe Fifth: Cleanse wound with mild soap and water May Shower, gently pat wound dry prior to applying new dressing. Anesthetic: Wound #1 Right Calcaneus: Topical Lidocaine 4% cream applied to wound bed prior to debridement - in clinic Wound #2 Left,Medial Malleolus: Topical Lidocaine 4% cream applied to wound bed prior to debridement - in clinic Wound #3 Right,Medial Toe Fifth: Topical Lidocaine 4% cream applied to wound bed prior to debridement - in clinic Primary Wound Dressing: Wound #1 Right Calcaneus: Aquacel Ag Wound #2 Left,Medial Malleolus: Aquacel Ag Wound #3 Right,Medial Toe Fifth: Aquacel Ag Secondary Dressing: Wound #1 Right Calcaneus: Gauze and Kerlix/Conform Other - heel pads Wound #2 Left,Medial Malleolus: Gauze and Kerlix/Conform Other - heel pads Wound #3 Right,Medial Toe Fifth: Gauze and Kerlix/Conform Other - heel pads Dressing Change Frequency: Wound #1 Right Calcaneus: Change Dressing Monday, Wednesday, Friday - she comes to the wound clinic wednesdays Follow-up Appointments: SERIYAH, COLLISON ([id number]) Wound #1 Right Calcaneus: Return Appointment in 1 week. Edema Control: Wound #1 Right Calcaneus: Elevate legs to the level of the heart and pump ankles as often as possible Off-Loading: Wound #1 Right Calcaneus: Other: - elevated legs on pillows Additional Orders / Instructions: Wound #1 Right Calcaneus: Increase protein intake. Home Health: Wound #1 Right Calcaneus: Continue Home Health Visits - [place] Nurse may visit PRN to address patient s wound care needs. FACE TO FACE ENCOUNTER: MEDICARE and MEDICAID PATIENTS: I certify that this patient is under my care and that I had a face-to-face encounter that meets the physician face-to-face  encounter requirements with this patient on this date. The encounter with the patient was in whole or in part for the following MEDICAL CONDITION: (primary reason for [place]) MEDICAL NECESSITY: I certify, that based on my findings, NURSING services are a medically necessary  home health service. HOME BOUND STATUS: I certify that my clinical findings support that this patient is homebound (i.e., Due to illness or injury, pt requires aid of supportive devices such as crutches, cane, wheelchairs, walkers, the use of special transportation or the assistance of another person to leave their place of residence. There is a normal inability to leave the home and doing so requires considerable and taxing effort. Other absences are for medical reasons / religious services and are infrequent or of short duration when for other reasons). If current dressing causes regression in wound condition, may D/C ordered dressing product/s and apply Normal Saline Moist Dressing daily until next [place] / Other MD appointment. [place] of regression in wound condition at [phone]. Please direct any NON-WOUND related issues/requests for orders to patient's Primary Care Physician #1 she has follow-up for her PAD with Dr. Fletcher Anon. She also has stents in her left leg I believe placed by Dr. dew #2 we had been making progress in the right heel with Aquacel Ag. I'm also going to use this in her right fifth toe. The toe wound may be more problematic #3 she has an intact blister on the left medial heel this will need to be protected with Kerlix and conformer and underlying heel pads Electronic Signature(s) Signed: 08/11/2016 10:59:48 AM By: Gretta Cool, BSN, RN, CWS, Kim RN, BSN Signed: 08/22/2016 12:40:58 PM By: Linton Ham MD Previous Signature: 08/08/2016 5:35:48 PM Version By: Linton Ham MD [name] [name], JACKEE GLASNER ([id number]) Entered By: Gretta Cool, BSN, RN, CWS, Kim on 08/11/2016  10:59:48 [name] [name], [name] [name] ([id number]) -------------------------------------------------------------------------------- SuperBill Details [name] [name], Eather Date of Service: 08/08/2016 [name] Patient Account Number: [id number] Medical Record Treating RN: Cornell Barman [id number] Number: Other Clinician: Date of Birth/Sex: 08/07/1931 (83 y.o. 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 E11.51 Type 2 diabetes mellitus with diabetic peripheral angiopathy without gangrene E11.42 Type 2 diabetes mellitus with diabetic polyneuropathy Facility Procedures CPT4: Description Modifier Quantity Code [id number] - WOUND CARE VISIT-LEV 4 EST PT 1 CPT4: [id number] - DEB SUBQ TISSUE 20 SQ CM/< 1 ICD-10 Description Diagnosis E11.621 Type 2 diabetes mellitus with foot ulcer 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

## 2016-08-10 ENCOUNTER — Telehealth: Payer: Self-pay | Admitting: Cardiovascular Disease

## 2016-08-10 DIAGNOSIS — N183 Chronic kidney disease, stage 3 (moderate): Secondary | ICD-10-CM | POA: Diagnosis not present

## 2016-08-10 DIAGNOSIS — M6281 Muscle weakness (generalized): Secondary | ICD-10-CM | POA: Diagnosis not present

## 2016-08-10 DIAGNOSIS — L89612 Pressure ulcer of right heel, stage 2: Secondary | ICD-10-CM | POA: Diagnosis not present

## 2016-08-10 DIAGNOSIS — E1122 Type 2 diabetes mellitus with diabetic chronic kidney disease: Secondary | ICD-10-CM | POA: Diagnosis not present

## 2016-08-10 DIAGNOSIS — E1151 Type 2 diabetes mellitus with diabetic peripheral angiopathy without gangrene: Secondary | ICD-10-CM | POA: Diagnosis not present

## 2016-08-10 DIAGNOSIS — I110 Hypertensive heart disease with heart failure: Secondary | ICD-10-CM | POA: Diagnosis not present

## 2016-08-10 DIAGNOSIS — S91114D Laceration without foreign body of right lesser toe(s) without damage to nail, subsequent encounter: Secondary | ICD-10-CM | POA: Diagnosis not present

## 2016-08-10 DIAGNOSIS — I5042 Chronic combined systolic (congestive) and diastolic (congestive) heart failure: Secondary | ICD-10-CM | POA: Diagnosis not present

## 2016-08-10 DIAGNOSIS — I69398 Other sequelae of cerebral infarction: Secondary | ICD-10-CM | POA: Diagnosis not present

## 2016-08-10 NOTE — Telephone Encounter (Signed)
Lmov for patient to call back and schedule 1 MONTH Fu with Dr Fletcher Anon  Will try again at a later time

## 2016-08-10 NOTE — Progress Notes (Signed)
TRENESE, HAFT ([id number]) Visit Report for 08/08/2016 Arrival Information Details [name], Gwendalynn Date of Service: 08/08/2016 3:30 PM [name] Patient Account Number: [id number] Medical Record Treating RN: Cornell Barman [id number] Number: Other Clinician: Date of Birth/Sex: 1931/03/25 (83 y.o. Female) Treating ROBSON, MICHAEL Primary Care Modine Oppenheimer: LAM, LYNN Montez Cuda/Extender: G Referring Kenitra Leventhal: LAM, LYNN Weeks in Treatment: 2 Visit Information History Since Last Visit Added or deleted any medications: No Patient Arrived: Wheel Chair Any new allergies or adverse reactions: No Arrival Time: 15:03 Had a fall or experienced change in No activities of daily living that may affect Accompanied By: spouse risk of falls: Transfer Assistance: Manual Signs or symptoms of abuse/neglect since last No Patient Identification Verified: Yes visito Secondary Verification Process Yes Hospitalized since last visit: No Completed: Pain Present Now: No Patient Requires Transmission-Based No Precautions: Patient Has Alerts: No Electronic Signature(s) Signed: 08/08/2016 3:55:06 PM By: Gretta Cool, BSN, RN, CWS, Kim RN, BSN Entered By: Gretta Cool, BSN, RN, CWS, Kim on 08/08/2016 15:03:44 [name], [name] ([id number]) -------------------------------------------------------------------------------- Clinic Level of Care Assessment Details [name], Zakaiya Date of Service: 08/08/2016 3:30 PM [name] Patient Account Number: [id number] Medical Record Treating RN: Cornell Barman [id number] Number: Other Clinician: Date of Birth/Sex: 1931/06/05 (83 y.o. Female) Treating ROBSON, [place] Primary Care Nakiyah Beverley: LAM, LYNN Calene Paradiso/Extender: G Referring Thor Nannini: LAM, LYNN Weeks in Treatment: 2 Clinic Level of Care Assessment Items TOOL 4 Quantity Score []  - Use when only an EandM is performed on FOLLOW-UP visit 0 ASSESSMENTS - Nursing Assessment / Reassessment []  - Reassessment of  Co-morbidities (includes updates in patient status) 0 X - Reassessment of Adherence to Treatment Plan 1 5 ASSESSMENTS - Wound and Skin Assessment / Reassessment []  - Simple Wound Assessment / Reassessment - one wound 0 X - Complex Wound Assessment / Reassessment - multiple wounds 3 5 []  - Dermatologic / Skin Assessment (not related to wound area) 0 ASSESSMENTS - Focused Assessment []  - Circumferential Edema Measurements - multi extremities 0 []  - Nutritional Assessment / Counseling / Intervention 0 []  - Lower Extremity Assessment (monofilament, tuning fork, pulses) 0 []  - Peripheral Arterial Disease Assessment (using hand held doppler) 0 ASSESSMENTS - Ostomy and/or Continence Assessment and Care []  - Incontinence Assessment and Management 0 []  - Ostomy Care Assessment and Management (repouching, etc.) 0 PROCESS - Coordination of Care X - Simple Patient / Family Education for ongoing care 1 15 []  - Complex (extensive) Patient / Family Education for ongoing care 0 X - Staff obtains Programmer, systems, Records, Test Results / Process Orders 1 10 []  - Staff telephones HHA, Nursing Homes / Clarify orders / etc 0 [name], Angelyne C. ([id number]) []  - Routine Transfer to another Facility (non-emergent condition) 0 []  - Routine Hospital Admission (non-emergent condition) 0 []  - New Admissions / Biomedical engineer / Ordering NPWT, Apligraf, etc. 0 []  - Emergency Hospital Admission (emergent condition) 0 X - Simple Discharge Coordination 1 10 []  - Complex (extensive) Discharge Coordination 0 PROCESS - Special Needs []  - Pediatric / Minor Patient Management 0 []  - Isolation Patient Management 0 []  - Hearing / Language / Visual special needs 0 []  - Assessment of Community assistance (transportation, D/C planning, etc.) 0 []  - Additional assistance / Altered mentation 0 []  - Support Surface(s) Assessment (bed, cushion, seat, etc.) 0 INTERVENTIONS - Wound Cleansing / Measurement []  - Simple Wound  Cleansing - one wound 0 X - Complex Wound Cleansing - multiple wounds 3 5 X - Wound Imaging (photographs - any number of wounds) 1  5 []  - Wound Tracing (instead of photographs) 0 []  - Simple Wound Measurement - one wound 0 X - Complex Wound Measurement - multiple wounds 3 5 INTERVENTIONS - Wound Dressings []  - Small Wound Dressing one or multiple wounds 0 X - Medium Wound Dressing one or multiple wounds 3 15 []  - Large Wound Dressing one or multiple wounds 0 []  - Application of Medications - topical 0 []  - Application of Medications - injection 0 [name], Tuwana C. ([id number]) INTERVENTIONS - Miscellaneous []  - External ear exam 0 []  - Specimen Collection (cultures, biopsies, blood, body fluids, etc.) 0 []  - Specimen(s) / Culture(s) sent or taken to Lab for analysis 0 []  - Patient Transfer (multiple staff / Civil Service fast streamer / Similar devices) 0 []  - Simple Staple / Suture removal (25 or less) 0 []  - Complex Staple / Suture removal (26 or more) 0 []  - Hypo / Hyperglycemic Management (close monitor of Blood Glucose) 0 []  - Ankle / Brachial Index (ABI) - do not check if billed separately 0 X - Vital Signs 1 5 Has the patient been seen at the hospital within the last three years: Yes Total Score: 140 Level Of Care: New/Established - Level 4 Electronic Signature(s) Signed: 08/08/2016 3:55:06 PM By: Gretta Cool, BSN, RN, CWS, Kim RN, BSN Entered By: Gretta Cool, BSN, RN, CWS, Kim on 08/08/2016 15:51:56 [name], [name] ([id number]) -------------------------------------------------------------------------------- Encounter Discharge Information Details [name], Romie Minus Date of Service: 08/08/2016 3:30 PM [name] Patient Account Number: [id number] Medical Record Treating RN: Cornell Barman [id number] Number: Other Clinician: Date of Birth/Sex: 05-05-1931 (83 y.o. Female) Treating ROBSON, MICHAEL Primary Care Lamees Gable: LAM, LYNN Cordney Barstow/Extender: G Referring Bettye Sitton: LAM, LYNN Weeks  in Treatment: 2 Encounter Discharge Information Items Discharge Pain Level: 3 Discharge Condition: Stable Ambulatory Status: Wheelchair Discharge Destination: Home Transportation: Private Auto Accompanied By: spouse Schedule Follow-up Appointment: Yes Medication Reconciliation completed Yes and provided to Patient/Care Willam Munford: Provided on Clinical Summary of Care: 08/08/2016 Form Type Recipient Paper Patient JMS Electronic Signature(s) Signed: 08/08/2016 3:55:06 PM By: Gretta Cool, BSN, RN, CWS, Kim RN, BSN Previous Signature: 08/08/2016 3:51:19 PM Version By: Ruthine Dose Entered By: Gretta Cool BSN, RN, CWS, Kim on 08/08/2016 15:54:27 [name], [name] ([id number]) -------------------------------------------------------------------------------- Lower Extremity Assessment Details [name], Adisyn Date of Service: 08/08/2016 3:30 PM [name] Patient Account Number: [id number] Medical Record Treating RN: Cornell Barman [id number] Number: Other Clinician: Date of Birth/Sex: 1931-02-15 (83 y.o. Female) Treating ROBSON, MICHAEL Primary Care Travion Ke: LAM, LYNN Suliman Termini/Extender: G Referring Nykeria Mealing: LAM, LYNN Weeks in Treatment: 2 Edema Assessment Assessed: [Left: No] [Right: No] E[Left: dema] [Right: :] Calf Left: Right: Point of Measurement: 38 cm From Medial Instep cm 36 cm Ankle Left: Right: Point of Measurement: 10 cm From Medial Instep cm 27 cm Vascular Assessment Pulses: Dorsalis Pedis Palpable: [Left:No] [Right:No] Doppler Audible: [Left:Inaudible] [Right:Yes] Posterior Tibial Extremity colors, hair growth, and conditions: Extremity Color: [Left:Normal] [Right:Normal] Hair Growth on Extremity: [Left:Yes] [Right:Yes] Temperature of Extremity: [Left:Warm] [Right:Warm] Capillary Refill: [Left:> 3 seconds] [Right:> 3 seconds] Toe Nail Assessment Left: Right: Thick: No Yes Discolored: Yes Yes Deformed: Yes Yes Improper Length and Hygiene: Yes Yes Electronic  Signature(s) Signed: 08/08/2016 3:55:06 PM By: Gretta Cool, BSN, RN, CWS, Kim RN, BSN [name], Harrington ([id number]) Entered By: Gretta Cool, BSN, RN, CWS, Kim on 08/08/2016 15:22:06 [name], [name] ([id number]) -------------------------------------------------------------------------------- Multi Wound Chart Details [name], Neidy Date of Service: 08/08/2016 3:30 PM [name] Patient Account Number: [id number] Medical Record Treating RN: Cornell Barman [id number] Number: Other Clinician: Date of  Birth/Sex: August 20, 1931 (82 y.o. 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1 2x2.5x0.1 0.4x0.5x0.9 (cm) Area (cm) : 1.767 3.927 0.157 Volume (cm) : 0.177 0.393 0.141 % Reduction in Area: 51.10% N/A N/A % Reduction in Volume: 51.00% N/A N/A Classification: Grade 1 Grade 0  Grade 2 Exudate Amount: Large None Present Medium Exudate Type: Serous N/A Serous Exudate Color: amber N/A amber [name], Ephrata C. ([id number]) Foul Odor After Yes No No Cleansing: Odor Anticipated Due to No N/A N/A Product Use: Wound Margin: Distinct, outline attached Flat and Intact Flat and Intact Granulation Amount: Medium (34-66%) Large (67-100%) None Present (0%) Granulation Quality: Pale N/A N/A Necrotic Amount: Medium (34-66%) N/A Large (67-100%) Exposed Structures: Fat Layer (Subcutaneous Fascia: No Fat Layer (Subcutaneous Tissue) Exposed: Yes Fat Layer (Subcutaneous Tissue) Exposed: Yes Fascia: No Tissue) Exposed: No Fascia: No Tendon: No Tendon: No Tendon: No Muscle: No Muscle: No Muscle: No Joint: No Joint: No Joint: No Bone: No Bone: No Bone: No Limited to Skin Breakdown Epithelialization: None Large (67-100%) N/A Periwound Skin Texture: Excoriation: No No Abnormalities Noted No Abnormalities Noted Induration: No Callus: No Crepitus: No Rash: No Scarring: No Periwound Skin Dry/Scaly: Yes No Abnormalities Noted No Abnormalities Noted Moisture: Maceration: No Periwound Skin Color: Atrophie Blanche: No No Abnormalities Noted No Abnormalities Noted Cyanosis: No Ecchymosis: No Erythema: No Hemosiderin Staining: No Mottled: No Pallor: No Rubor: No Temperature: No Abnormality N/A N/A Tenderness on No No No Palpation: Wound Preparation: Ulcer Cleansing: N/A Ulcer Cleansing: Rinsed/Irrigated with Rinsed/Irrigated with Saline Saline Topical Anesthetic Topical Anesthetic Applied: Other: lidocaine Applied: Other: lidocaine 4% 4% Assessment Notes: N/A Fluid Filled Bilister N/A Treatment Notes Wound #1 (Right Calcaneus) 1. Cleansed with: [name], Destry C. ([id number]) Clean wound with Normal Saline 2. Anesthetic Topical Lidocaine 4% cream to wound bed prior to debridement 4. Dressing Applied: Aquacel Ag 5. Secondary Dressing  Applied Dry Gauze Kerlix/Conform 7. Secured with Tape Notes heel cup Wound #2 (Left, Medial Malleolus) 1. Cleansed with: Clean wound with Normal Saline 4. Dressing Applied: Dry Gauze Notes heel cup; kerlix Wound #3 (Right, Medial Toe Fifth) 1. Cleansed with: Clean wound with Normal Saline 2. Anesthetic Topical Lidocaine 4% cream to wound bed prior to debridement 4. Dressing Applied: Aquacel Ag 5. Secondary Dressing Applied Dry Gauze Kerlix/Conform 7. Secured with Tape Notes heel cup Electronic Signature(s) Signed: 08/08/2016 5:35:48 PM By: Linton Ham MD Previous Signature: 08/08/2016 3:55:06 PM Version By: Gretta Cool, BSN, RN, CWS, Kim RN, BSN Entered By: Linton Ham on 08/08/2016 17:02:42 [name], [name] ([id number]) -------------------------------------------------------------------------------- Multi-Disciplinary Care Plan Details [name], Romie Minus Date of Service: 08/08/2016 3:30 PM [name] Patient Account Number: [id number] Medical Record Treating RN:  Cornell Barman [id number] Number: Other Clinician: Date of Birth/Sex: [birth year] (83 y.o. Female) Treating ROBSON, MICHAEL Primary Care Tanyiah Laurich: LAM, LYNN Belenda Alviar/Extender: G Referring Sabas Frett: LAM, LYNN Weeks in Treatment: 2 Active Inactive ` Orientation to the Wound Care Program Nursing Diagnoses: Knowledge deficit related to the wound healing center program Goals: Patient/caregiver will verbalize understanding of the [place] Date Initiated: 07/26/2016 Target Resolution Date: 07/31/2016 Goal Status: Active Interventions: Provide education on orientation to the wound center Notes: ` Soft Tissue Infection Nursing Diagnoses: Impaired tissue integrity Goals: Signs and symptoms of infection will be recognized early to allow for prompt treatment Date Initiated: 07/26/2016 Target Resolution Date: 07/31/2016 Goal Status: Active Interventions: Assess signs and symptoms of  infection every visit Notes: ` Wound/Skin Impairment Nursing Diagnoses: ANGLE, DIRUSSO ([id number]) Impaired tissue integrity Goals: Ulcer/skin breakdown will heal within 14 weeks Date Initiated: 07/26/2016 Target Resolution Date: 10/30/2016 Goal Status: Active Interventions: Assess patient/caregiver ability to perform ulcer/skin care regimen upon admission and as needed Treatment Activities: Skin care regimen initiated : 07/26/2016 Notes: Electronic Signature(s) Signed: 08/08/2016 3:55:06 PM By: Gretta Cool, BSN, RN, CWS, Kim RN, BSN Entered By: Gretta Cool, BSN, RN, CWS, Kim on 08/08/2016 15:36:24 [name], [name] ([id number]) -------------------------------------------------------------------------------- Pain Assessment Details [name], Taimi Date of Service: 08/08/2016 3:30 PM [name] Patient Account Number: [id number] Medical Record Treating RN: Cornell Barman [id number] Number: Other Clinician: Date of Birth/Sex: Jul 04, 1931 (83 y.o. Female) Treating ROBSON, MICHAEL Primary Care Romie Keeble: LAM, LYNN Bowie Delia/Extender: G Referring Khaila Velarde: LAM, LYNN Weeks in Treatment: 2 Active Problems Location of Pain Severity and Description of Pain Patient Has Paino No Site Locations With Dressing Change: No Pain Management and Medication Current Pain Management: Goals for Pain Management Topical or injectable lidocaine is offered to patient for acute pain when surgical debridement is performed. If needed, Patient is instructed to use over the counter pain medication for the following 24-48 hours after debridement. Wound care MDs do not prescribed pain medications. Patient has chronic pain or uncontrolled pain. Patient has been instructed to make an appointment with their Primary Care Physician for pain management. Electronic Signature(s) Signed: 08/08/2016 3:55:06 PM By: Gretta Cool, BSN, RN, CWS, Kim RN, BSN Entered By: Gretta Cool, BSN, RN, CWS, Kim on 08/08/2016  15:04:17 [name], [name] ([id number]) -------------------------------------------------------------------------------- Patient/Caregiver Education Details Melissa Montane Date of Service: 08/08/2016 3:30 PM [name] Patient Account Number: [id number] Medical Record Treating RN: Cornell Barman [id number] Number: Other Clinician: Date of Birth/Gender: 29-Jun-1931 (83 y.o. Female) Treating ROBSON, MICHAEL Primary Care Physician/Extender: Autumn Messing Physician: Suella [place] in Treatment: 2 Referring Physician: LAM, LYNN Education Assessment Education Provided To: Patient Education Topics Provided Pressure: Handouts: Preventing Pressure Ulcers Methods: Demonstration, Explain/Verbal Responses: State content correctly Wound/Skin Impairment: Handouts: Caring for Your Ulcer Methods: Demonstration, Explain/Verbal Responses: State content correctly Electronic Signature(s) Signed: 08/08/2016 3:55:06 PM By: Gretta Cool, BSN, RN, CWS, Kim RN, BSN Entered By: Gretta Cool, BSN, RN, CWS, Kim on 08/08/2016 15:54:44 [name], [name] ([id number]) -------------------------------------------------------------------------------- Wound Assessment Details [name], Sera Date of Service: 08/08/2016 3:30 PM [name] Patient Account Number: [id number] Medical Record Treating RN: Cornell Barman [id number] Number: Other Clinician: Date of Birth/Sex: [birth year] (83 y.o. Female) Treating ROBSON, MICHAEL Primary Care Alder Murri: LAM, LYNN Jamarquis Crull/Extender: G Referring Aamna Mallozzi: LAM, LYNN Weeks in Treatment: 2 Wound Status Wound Number: 1 Primary Diabetic Wound/Ulcer of the Lower Etiology: Extremity Wound Location: Right Calcaneus Wound Open Wounding Event: Gradually Appeared Status: Date Acquired: 06/13/2016 Comorbid Cataracts, Arrhythmia, Coronary Artery Weeks Of Treatment: 2 History: Disease, Hypertension, Type II  Clustered Wound: No Diabetes, History of pressure wounds, Osteoarthritis,  Neuropathy Photos Wound Measurements Length: (cm) 1.5 Width: (cm) 1.5 Depth: (cm) 0.1 Area: (cm) 1.767 Volume: (cm) 0.177 % Reduction in Area: 51.1% % Reduction in Volume: 51% Epithelialization: None Tunneling: No Wound Description Classification: Grade 1 Foul Odor Aft Wound Margin: Distinct, outline attached Due to Produc Exudate Amount: Large Slough/Fibrin Exudate Type: Serous Exudate Color: amber er Cleansing: Yes t Use: No o Yes Wound Bed Granulation Amount: Medium (34-66%) Exposed Structure Granulation Quality: Pale Fascia Exposed: No Necrotic Amount: Medium (34-66%) Fat Layer (Subcutaneous Tissue) Exposed: Yes Necrotic Quality: Adherent Slough Tendon Exposed: No [name], Jniyah C. ([id number]) Muscle Exposed: No Joint Exposed: No Bone Exposed: No Periwound Skin Texture Texture Color No Abnormalities Noted: No No Abnormalities Noted: No Callus: No Atrophie Blanche: No Crepitus: No Cyanosis: No Excoriation: No Ecchymosis: No Induration: No Erythema: No Rash: No Hemosiderin Staining: No Scarring: No Mottled: No Pallor: No Moisture Rubor: No No Abnormalities Noted: No Dry / Scaly: Yes Temperature / Pain Maceration: No Temperature: No Abnormality Wound Preparation Ulcer Cleansing: Rinsed/Irrigated with Saline Topical Anesthetic Applied: Other: lidocaine 4%, Treatment Notes Wound #1 (Right Calcaneus) 1. Cleansed with: Clean wound with Normal Saline 2. Anesthetic Topical Lidocaine 4% cream to wound bed prior to debridement 4. Dressing Applied: Aquacel Ag 5. Secondary Dressing Applied Dry Gauze Kerlix/Conform 7. Secured with Tape Notes heel cup Electronic Signature(s) Signed: 08/08/2016 3:55:06 PM By: Gretta Cool, BSN, RN, CWS, Kim RN, BSN Entered By: Gretta Cool, BSN, RN, CWS, Kim on 08/08/2016 15:12:31 [name], [name] ([id number]) -------------------------------------------------------------------------------- Wound Assessment  Details [name], Cortne Date of Service: 08/08/2016 3:30 PM [name] Patient Account Number: [id number] Medical Record Treating RN: Cornell Barman [id number] Number: Other Clinician: Date of Birth/Sex: 19-Sep-1931 (83 y.o. Female) Treating ROBSON, [place] Primary Care Kalim Kissel: LAM, LYNN Romey Cohea/Extender: G Referring Ladarren Steiner: LAM, LYNN Weeks in Treatment: 2 Wound Status Wound Number: 2 Primary Diabetic Wound/Ulcer of the Lower Etiology: Extremity Wound Location: Left Malleolus - Medial Wound Open Wounding Event: Blister Status: Date Acquired: 08/06/2016 Comorbid Cataracts, Arrhythmia, Coronary Artery Weeks Of Treatment: 0 History: Disease, Hypertension, Type II Clustered Wound: No Diabetes, History of pressure wounds, Osteoarthritis, Neuropathy Photos Wound Measurements Length: (cm) 2 % Reduction in Are Width: (cm) 2.5 % Reduction in Vol Depth: (cm) 0.1 Epithelialization: Area: (cm) 3.927 Tunneling: Volume: (cm) 0.393 Undermining: a: ume: Large (67-100%) No No Wound Description Classification: Grade 0 Foul Odor After Cl Wound Margin: Flat and Intact Slough/Fibrino Exudate Amount: None Present eansing: No No Wound Bed Granulation Amount: Large (67-100%) Exposed Structure Fascia Exposed: No Fat Layer (Subcutaneous Tissue) Exposed: No Tendon Exposed: No Muscle Exposed: No Joint Exposed: No [name], Santiaga C. ([id number]) Bone Exposed: No Limited to Skin Breakdown Periwound Skin Texture Texture Color No Abnormalities Noted: No No Abnormalities Noted: No Moisture No Abnormalities Noted: No Assessment Notes Fluid Filled Bilister Treatment Notes Wound #2 (Left, Medial Malleolus) 1. Cleansed with: Clean wound with Normal Saline 4. Dressing Applied: Dry Gauze Notes heel cup; kerlix Electronic Signature(s) Signed: 08/08/2016 3:55:06 PM By: Gretta Cool, BSN, RN, CWS, Kim RN, BSN Entered By: Gretta Cool, BSN, RN, CWS, Kim on 08/08/2016  15:14:36 [name], [name] ([id number]) -------------------------------------------------------------------------------- Wound Assessment Details [name], Gwendy Date of Service: 08/08/2016 3:30 PM [name] Patient Account Number: [id number] Medical Record Treating RN: Cornell Barman [id number] Number: Other Clinician: Date of Birth/Sex: [birth year] (83 y.o. Female) Treating ROBSON, MICHAEL Primary Care Aitana Burry: LAM, LYNN Sathvika Ojo/Extender: G Referring Jariah Jarmon: LAM, LYNN Weeks in Treatment: 2 Wound Status  Wound Number: 3 Primary Diabetic Wound/Ulcer of the Lower Etiology: Extremity Wound Location: Right Toe Fifth - Medial Wound Open Wounding Event: Pressure Injury Status: Date Acquired: 07/10/2016 Comorbid Cataracts, Arrhythmia, Coronary Artery Weeks Of Treatment: 0 History: Disease, Hypertension, Type II Clustered Wound: No Diabetes, History of pressure wounds, Osteoarthritis, Neuropathy Photos Wound Measurements Length: (cm) Width: (cm) Depth: (cm) Area: (cm) Volume: (cm) 0.4 % Reduction in Area: 0.5 % Reduction in Volume: 0.9 0.157 0.141 Wound Description Classification: Grade 2 Foul Odor After Cl Wound Margin: Flat and Intact Slough/Fibrino Exudate Amount: Medium Exudate Type: Serous Exudate Color: amber eansing: No Yes Wound Bed Granulation Amount: None Present (0%) Exposed Structure Necrotic Amount: Large (67-100%) Fascia Exposed: No Necrotic Quality: Adherent Slough Fat Layer (Subcutaneous Tissue) Exposed: Yes Tendon Exposed: No [name], Ahley C. ([id number]) Muscle Exposed: No Joint Exposed: No Bone Exposed: No Periwound Skin Texture Texture Color No Abnormalities Noted: No No Abnormalities Noted: No Moisture No Abnormalities Noted: No Wound Preparation Ulcer Cleansing: Rinsed/Irrigated with Saline Topical Anesthetic Applied: Other: lidocaine 4%, Treatment Notes Wound #3 (Right, Medial Toe Fifth) 1. Cleansed  with: Clean wound with Normal Saline 2. Anesthetic Topical Lidocaine 4% cream to wound bed prior to debridement 4. Dressing Applied: Aquacel Ag 5. Secondary Dressing Applied Dry Gauze Kerlix/Conform 7. Secured with Tape Notes heel cup Electronic Signature(s) Signed: 08/08/2016 3:55:06 PM By: Gretta Cool, BSN, RN, CWS, Kim RN, BSN Entered By: Gretta Cool, BSN, RN, CWS, Kim on 08/08/2016 15:17:28 [name], [name] ([id number]) -------------------------------------------------------------------------------- Vitals Details [name], Julianne Date of Service: 08/08/2016 3:30 PM [name] Patient Account Number: [id number] Medical Record Treating RN: Cornell Barman [id number] Number: Other Clinician: Date of Birth/Sex: [birth year] (83 y.o. Female) Treating ROBSON, MICHAEL Primary Care Alexavier Tsutsui: LAM, LYNN Emori Mumme/Extender: G Referring Estha Few: LAM, LYNN Weeks in Treatment: 2 Vital Signs Time Taken: 15:04 Temperature (F): 98.0 Height (in): 63 Pulse (bpm): 61 Weight (lbs): 203 Respiratory Rate (breaths/min): 16 Body Mass Index (BMI): 36 Blood Pressure (mmHg): 178/61 Reference Range: 80 - 120 mg / dl Electronic Signature(s) Signed: 08/08/2016 3:55:06 PM By: Gretta Cool, BSN, RN, CWS, Kim RN, BSN Entered By: Gretta Cool, BSN, RN, CWS, Kim on 08/08/2016 15:04:37

## 2016-08-11 DIAGNOSIS — N183 Chronic kidney disease, stage 3 (moderate): Secondary | ICD-10-CM | POA: Diagnosis not present

## 2016-08-11 DIAGNOSIS — L89612 Pressure ulcer of right heel, stage 2: Secondary | ICD-10-CM | POA: Diagnosis not present

## 2016-08-11 DIAGNOSIS — I110 Hypertensive heart disease with heart failure: Secondary | ICD-10-CM | POA: Diagnosis not present

## 2016-08-11 DIAGNOSIS — I5042 Chronic combined systolic (congestive) and diastolic (congestive) heart failure: Secondary | ICD-10-CM | POA: Diagnosis not present

## 2016-08-11 DIAGNOSIS — S91114D Laceration without foreign body of right lesser toe(s) without damage to nail, subsequent encounter: Secondary | ICD-10-CM | POA: Diagnosis not present

## 2016-08-11 DIAGNOSIS — E1122 Type 2 diabetes mellitus with diabetic chronic kidney disease: Secondary | ICD-10-CM | POA: Diagnosis not present

## 2016-08-11 DIAGNOSIS — M6281 Muscle weakness (generalized): Secondary | ICD-10-CM | POA: Diagnosis not present

## 2016-08-11 DIAGNOSIS — E1151 Type 2 diabetes mellitus with diabetic peripheral angiopathy without gangrene: Secondary | ICD-10-CM | POA: Diagnosis not present

## 2016-08-11 DIAGNOSIS — I69398 Other sequelae of cerebral infarction: Secondary | ICD-10-CM | POA: Diagnosis not present

## 2016-08-14 ENCOUNTER — Other Ambulatory Visit: Payer: Self-pay | Admitting: *Deleted

## 2016-08-14 DIAGNOSIS — I69398 Other sequelae of cerebral infarction: Secondary | ICD-10-CM | POA: Diagnosis not present

## 2016-08-14 DIAGNOSIS — I110 Hypertensive heart disease with heart failure: Secondary | ICD-10-CM | POA: Diagnosis not present

## 2016-08-14 DIAGNOSIS — S91114D Laceration without foreign body of right lesser toe(s) without damage to nail, subsequent encounter: Secondary | ICD-10-CM | POA: Diagnosis not present

## 2016-08-14 DIAGNOSIS — E1122 Type 2 diabetes mellitus with diabetic chronic kidney disease: Secondary | ICD-10-CM | POA: Diagnosis not present

## 2016-08-14 DIAGNOSIS — I5042 Chronic combined systolic (congestive) and diastolic (congestive) heart failure: Secondary | ICD-10-CM | POA: Diagnosis not present

## 2016-08-14 DIAGNOSIS — L89612 Pressure ulcer of right heel, stage 2: Secondary | ICD-10-CM | POA: Diagnosis not present

## 2016-08-14 DIAGNOSIS — E1151 Type 2 diabetes mellitus with diabetic peripheral angiopathy without gangrene: Secondary | ICD-10-CM | POA: Diagnosis not present

## 2016-08-14 DIAGNOSIS — N183 Chronic kidney disease, stage 3 (moderate): Secondary | ICD-10-CM | POA: Diagnosis not present

## 2016-08-14 DIAGNOSIS — M6281 Muscle weakness (generalized): Secondary | ICD-10-CM | POA: Diagnosis not present

## 2016-08-14 NOTE — Patient Outreach (Signed)
[facility]) Care Management  08/14/2016  [name] [name] 1931/12/08 [id number]   Incoming call from patient.  Patient discussed her  recent visit at wound clinic and with  Dr.Arida. She spoke regarding reoccurrence of wound between right 5 th toe , new blister area to left ankle area and current right heel area wound. Patient discussed upcoming procedure to look at blood flow to leg on Wednesday,states it should be a short stay procedure and  she wants to make sure she is following instructions she had  before procedure and request home visit to assist with reviewing instructions.  Patient states her daughter has arranged transportation to pick patient up a home and bring to appointment and she will provide return transportation home.   Patient reports home health RN visiting biweekly for wound care , physical therapy twice weekly as well as occupational therapy, Patient anticipates home health RN visit on today.   Plan Plan home visit for 08/15/16.   [name], RN, [facility] [phone]- Mobile [phone]- Toll Free Main Office

## 2016-08-15 ENCOUNTER — Other Ambulatory Visit: Payer: Self-pay | Admitting: *Deleted

## 2016-08-15 ENCOUNTER — Ambulatory Visit: Payer: Self-pay | Admitting: *Deleted

## 2016-08-15 DIAGNOSIS — I69398 Other sequelae of cerebral infarction: Secondary | ICD-10-CM | POA: Diagnosis not present

## 2016-08-15 DIAGNOSIS — I110 Hypertensive heart disease with heart failure: Secondary | ICD-10-CM | POA: Diagnosis not present

## 2016-08-15 DIAGNOSIS — E1122 Type 2 diabetes mellitus with diabetic chronic kidney disease: Secondary | ICD-10-CM | POA: Diagnosis not present

## 2016-08-15 DIAGNOSIS — N183 Chronic kidney disease, stage 3 (moderate): Secondary | ICD-10-CM | POA: Diagnosis not present

## 2016-08-15 DIAGNOSIS — S91114D Laceration without foreign body of right lesser toe(s) without damage to nail, subsequent encounter: Secondary | ICD-10-CM | POA: Diagnosis not present

## 2016-08-15 DIAGNOSIS — I5042 Chronic combined systolic (congestive) and diastolic (congestive) heart failure: Secondary | ICD-10-CM | POA: Diagnosis not present

## 2016-08-15 DIAGNOSIS — M6281 Muscle weakness (generalized): Secondary | ICD-10-CM | POA: Diagnosis not present

## 2016-08-15 DIAGNOSIS — E1151 Type 2 diabetes mellitus with diabetic peripheral angiopathy without gangrene: Secondary | ICD-10-CM | POA: Diagnosis not present

## 2016-08-15 DIAGNOSIS — L89612 Pressure ulcer of right heel, stage 2: Secondary | ICD-10-CM | POA: Diagnosis not present

## 2016-08-15 NOTE — Telephone Encounter (Signed)
Lmov for patient to call back and schedule 1 MONTH Fu with Dr Fletcher Anon  Will try again at a later time

## 2016-08-15 NOTE — Patient Outreach (Signed)
[facility]) Care Management   08/15/2016  [name] [name] 1931-11-05 [id number]  DIETRICH KE is an 83 y.o. female  Subjective:  Patient discussed being a little concerned about procedure with maybe stents being placed on tomorrow. Discussed she wants to make sure she is following all the instructions in preparation for the test.  Patient discussed new wound to left heel that recently developed and treatment at wound care center. She reports home health RN visits twice weekly for wound care. Patient discussed need for new shoes once foot wound heals more.  Patient discussed she continues to check her blood sugar at least twice daily, takes sliding scale insulin when blood sugar in the 300 range , but does not take the full dose due to fear of having hypoglycemia .     Objective:  BP 138/78 (BP Location: Left Arm, Patient Position: Sitting, Cuff Size: Large)   Pulse 60   Resp 18   SpO2 97%  Review of Systems  Constitutional: Negative.   HENT: Negative.   Eyes: Negative.   Respiratory: Negative.   Cardiovascular: Positive for leg swelling.  Gastrointestinal: Negative.   Genitourinary: Negative.   Skin: Negative.   Neurological: Negative.   Endo/Heme/Allergies: Negative.   Psychiatric/Behavioral: Negative.     Physical Exam  Constitutional: She is oriented to person, place, and time. She appears well-developed and well-nourished.  Cardiovascular: Normal rate and normal heart sounds.   Respiratory: Effort normal.  GI: Soft. Bowel sounds are normal.  Neurological: She is alert and oriented to person, place, and time.  Skin: Skin is warm and dry.     Psychiatric: She has a normal mood and affect. Her behavior is normal. Judgment and thought content normal.    Encounter Medications:   Outpatient Encounter Prescriptions as of 08/15/2016  Medication Sig Note  . ACCU-CHEK SOFTCLIX LANCETS lancets CHECK BLOOD SUGARS TWICE DAILY   .  acetaminophen (TYLENOL) 325 MG tablet Take 650 mg by mouth every 6 (six) hours as needed for mild pain.   [name] aspirin 81 MG tablet Take 81 mg by mouth daily.   . bisacodyl (DULCOLAX) 5 MG EC tablet Take 5 mg by mouth daily as needed for moderate constipation.   . carvedilol (COREG) 25 MG tablet Take 25 mg by mouth 2 (two) times daily.   . furosemide (LASIX) 40 MG tablet TAKE 2 TABLETS BY MOUTH EVERY MORNING FOR FLUID (Patient taking differently: TAKE 2 TABLETS BY MOUTH TWICE DAILY) 11/30/2015: 2 tablets in the morning and 2 tablets mid day   . insulin aspart (NOVOLOG FLEXPEN) 100 UNIT/ML FlexPen Inject into the skin 3 (three) times daily with meals. Sliding Scale 2 units-6 units patient has list for 100-400 BS readings  100-200, 2 units, 201-250, 4 units, 251--300 6 units, 301-350, 8 units, 351 to 400 10 units. 08/02/2016: Has on hand if needed  . Insulin NPH Isophane & Regular (NOVOLIN 70/30 [place]) Inject 40-50 Units into the skin. 50 units every morning  50 units every evening 08/02/2016: Taking 56 units in the morning and evening   . Insulin Pen Needle (RELION SHORT PEN NEEDLES) 31G X 8 MM MISC 1 each by Does not apply route 2 (two) times daily.   [name] lisinopril (PRINIVIL,ZESTRIL) 40 MG tablet TAKE 1 TABLET DAILY (Patient not taking: Reported on 08/02/2016) 08/02/2016: Patient reports medications stopped after appointment with Kidney specialist  . Polyethyl Glycol-Propyl Glycol (SYSTANE) 0.4-0.3 % SOLN Place 1 drop into both eyes daily as needed (dry  eyes).   . polyethylene glycol (MIRALAX / GLYCOLAX) packet USE 1-2 PACKETS DAILY   . pravastatin (PRAVACHOL) 40 MG tablet TAKE 1 TABLET BY MOUTH AT BEDTIME   . ranitidine (ZANTAC) 150 MG tablet Take 150 mg by mouth 2 (two) times daily.    No facility-administered encounter medications on file as of 08/15/2016.     Functional Status:   In your present state of health, do you have any difficulty performing the following activities: 06/21/2016 02/17/2016    Hearing? N N  Vision? Y Y  Difficulty concentrating or making decisions? [name]  Walking or climbing stairs? Y Y  Dressing or bathing? Y Y  Doing errands, shopping? [name]  Preparing Food and eating ? Y Y  Using the Toilet? Y Y  In the past six months, have you accidently leaked urine? Y Y  Do you have problems with loss of bowel control? N Y  Managing your Medications? Y Y  Managing your Finances? [name]  Housekeeping or managing your Housekeeping? Y Y  Some recent data might be hidden    Fall/Depression Screening:    Fall Risk  06/21/2016 05/26/2016 02/17/2016  Falls in the past year? Yes No Yes  Number falls in past yr: 1 - 1  Injury with Fall? No - No  Risk for fall due to : Impaired balance/gait;Impaired mobility Impaired vision;Impaired mobility;Impaired balance/gait Impaired balance/gait;Impaired mobility;History of fall(s)  Follow up Falls evaluation completed;Falls prevention discussed;Education provided - Falls prevention discussed   PHQ 2/9 Scores 06/21/2016 05/26/2016 04/05/2016 12/28/2015 11/22/2015 11/18/2015 02/23/2015  PHQ - 2 Score 1 1 1  0 1 1 0    Assessment:  Routine home visit , well care home health physical therapy visit on today.  Diabetes 30 day average 257  Today's blood sugar 174 Patient admits to not taking sliding scale insulin as prescribed usually takes about 2 units only, has scale of how much to take for blood sugar reading,  she voiced being afraid of having hypoglycemia episode. Taking 70/30 insulin as prescribed twice daily.  Reviewed symptoms and treatment of hypoglycemia if occurs.  Discussed controlling diabetes helps with wound healing, reviewed having a snack at bedtime,limiting regular soda and portion control with sweets/ice cream. Patient reports eating delivered meals on wheels daily, that is low salt and balanced and does not add salt to food, but occasionally eats fast foods.  Patient reports being in the doughnut hole , insulin cost over $140 per  patient when filled monthly .   Wounds  Following up with wound clinic , home health RN visits twice weekly for wound care.  Assist with coordinating getting new diabetes shoes as wound heals .   Patient has upcoming angiogram procedure in am, need review on MD pre procedure recommendations, Patient states her daughter has arranged transportation to appointment in am , with [place] transportation for she and her husband.    Plan:  Reviewed pre procedure instruction listed on EPIC discharge instructions, wrote instructions down for patient in her notebook, reviewed with teach back.  Will follow up with patient in the next 2 weeks by telephone for continued complex care management program post transition of care  . Will place pharmacy consult related to cost concern of 70/30 insulin.   [name], RN, [facility] [phone]- Mobile [phone]- Toll Free Main Office

## 2016-08-16 ENCOUNTER — Ambulatory Visit (HOSPITAL_COMMUNITY)
Admission: RE | Admit: 2016-08-16 | Discharge: 2016-08-16 | Disposition: A | Discharge status: Home / Self Care | Payer: Medicare HMO | Source: Ambulatory Visit | Attending: Cardiovascular Disease | Admitting: Cardiovascular Disease

## 2016-08-16 ENCOUNTER — Encounter (HOSPITAL_COMMUNITY)
Admission: RE | Disposition: A | Discharge status: Home / Self Care | Payer: Self-pay | Source: Ambulatory Visit | Attending: Cardiovascular Disease

## 2016-08-16 ENCOUNTER — Ambulatory Visit: Payer: Medicare HMO | Admitting: Physician Assistant

## 2016-08-16 DIAGNOSIS — I2510 Atherosclerotic heart disease of native coronary artery without angina pectoris: Secondary | ICD-10-CM | POA: Insufficient documentation

## 2016-08-16 DIAGNOSIS — N189 Chronic kidney disease, unspecified: Secondary | ICD-10-CM | POA: Diagnosis not present

## 2016-08-16 DIAGNOSIS — Z794 Long term (current) use of insulin: Secondary | ICD-10-CM | POA: Diagnosis not present

## 2016-08-16 DIAGNOSIS — I70212 Atherosclerosis of native arteries of extremities with intermittent claudication, left leg: Secondary | ICD-10-CM | POA: Diagnosis not present

## 2016-08-16 DIAGNOSIS — Z87891 Personal history of nicotine dependence: Secondary | ICD-10-CM | POA: Insufficient documentation

## 2016-08-16 DIAGNOSIS — Z951 Presence of aortocoronary bypass graft: Secondary | ICD-10-CM | POA: Insufficient documentation

## 2016-08-16 DIAGNOSIS — E11621 Type 2 diabetes mellitus with foot ulcer: Secondary | ICD-10-CM | POA: Insufficient documentation

## 2016-08-16 DIAGNOSIS — E669 Obesity, unspecified: Secondary | ICD-10-CM | POA: Diagnosis not present

## 2016-08-16 DIAGNOSIS — I701 Atherosclerosis of renal artery: Secondary | ICD-10-CM | POA: Diagnosis not present

## 2016-08-16 DIAGNOSIS — E1151 Type 2 diabetes mellitus with diabetic peripheral angiopathy without gangrene: Secondary | ICD-10-CM | POA: Insufficient documentation

## 2016-08-16 DIAGNOSIS — I5032 Chronic diastolic (congestive) heart failure: Secondary | ICD-10-CM | POA: Diagnosis not present

## 2016-08-16 DIAGNOSIS — Z6835 Body mass index (BMI) 35.0-35.9, adult: Secondary | ICD-10-CM | POA: Diagnosis not present

## 2016-08-16 DIAGNOSIS — E1122 Type 2 diabetes mellitus with diabetic chronic kidney disease: Secondary | ICD-10-CM | POA: Diagnosis not present

## 2016-08-16 DIAGNOSIS — I130 Hypertensive heart and chronic kidney disease with heart failure and stage 1 through stage 4 chronic kidney disease, or unspecified chronic kidney disease: Secondary | ICD-10-CM | POA: Insufficient documentation

## 2016-08-16 DIAGNOSIS — Z8673 Personal history of transient ischemic attack (TIA), and cerebral infarction without residual deficits: Secondary | ICD-10-CM | POA: Diagnosis not present

## 2016-08-16 DIAGNOSIS — L97419 Non-pressure chronic ulcer of right heel and midfoot with unspecified severity: Secondary | ICD-10-CM | POA: Diagnosis not present

## 2016-08-16 DIAGNOSIS — Z7982 Long term (current) use of aspirin: Secondary | ICD-10-CM | POA: Insufficient documentation

## 2016-08-16 DIAGNOSIS — E785 Hyperlipidemia, unspecified: Secondary | ICD-10-CM | POA: Insufficient documentation

## 2016-08-16 DIAGNOSIS — I70234 Atherosclerosis of native arteries of right leg with ulceration of heel and midfoot: Secondary | ICD-10-CM | POA: Diagnosis not present

## 2016-08-16 DIAGNOSIS — R6889 Other general symptoms and signs: Secondary | ICD-10-CM | POA: Diagnosis not present

## 2016-08-16 DIAGNOSIS — G4733 Obstructive sleep apnea (adult) (pediatric): Secondary | ICD-10-CM | POA: Insufficient documentation

## 2016-08-16 DIAGNOSIS — I739 Peripheral vascular disease, unspecified: Secondary | ICD-10-CM

## 2016-08-16 HISTORY — PX: ABDOMINAL AORTOGRAM W/LOWER EXTREMITY: CATH118223

## 2016-08-16 LAB — GLUCOSE, CAPILLARY
GLUCOSE-CAPILLARY: 204 mg/dL — AB (ref 65–99)
Glucose-Capillary: 202 mg/dL — ABNORMAL HIGH (ref 65–99)

## 2016-08-16 SURGERY — ABDOMINAL AORTOGRAM W/LOWER EXTREMITY
Anesthesia: LOCAL

## 2016-08-16 MED ORDER — HYDRALAZINE HCL 20 MG/ML IJ SOLN
INTRAMUSCULAR | Status: DC | PRN
  Administered 2016-08-16: 10 mg via INTRAVENOUS

## 2016-08-16 MED ORDER — HYDRALAZINE HCL 20 MG/ML IJ SOLN
INTRAMUSCULAR | Status: AC
  Filled 2016-08-16: qty 1

## 2016-08-16 MED ORDER — LIDOCAINE HCL (PF) 1 % IJ SOLN
INTRAMUSCULAR | Status: AC
  Filled 2016-08-16: qty 30

## 2016-08-16 MED ORDER — SODIUM CHLORIDE 0.9 % IV SOLN
INTRAVENOUS | Status: DC
Start: 2016-08-16 — End: 2016-08-16

## 2016-08-16 MED ORDER — SODIUM CHLORIDE 0.9% FLUSH: 3.0000 mL | INTRAVENOUS | Status: DC | PRN

## 2016-08-16 MED ORDER — SODIUM CHLORIDE 0.9% FLUSH: 3.0000 mL | Freq: Two times a day (BID) | INTRAVENOUS | Status: DC

## 2016-08-16 MED ORDER — SODIUM CHLORIDE 0.9 % IV SOLN
INTRAVENOUS | Status: DC
  Administered 2016-08-16: 09:00:00 via INTRAVENOUS

## 2016-08-16 MED ORDER — HEPARIN (PORCINE) IN NACL 2-0.9 UNIT/ML-% IJ SOLN
INTRAMUSCULAR | Status: AC | PRN
  Administered 2016-08-16: 1000 mL

## 2016-08-16 MED ORDER — ASPIRIN 81 MG PO CHEW
81.0000 mg | CHEWABLE_TABLET | ORAL | Status: DC
Start: 2016-08-17 — End: 2016-08-16

## 2016-08-16 MED ORDER — LIDOCAINE HCL (PF) 1 % IJ SOLN
INTRAMUSCULAR | Status: DC | PRN
  Administered 2016-08-16: 10 mL via INTRADERMAL

## 2016-08-16 MED ORDER — FENTANYL CITRATE (PF) 100 MCG/2ML IJ SOLN
INTRAMUSCULAR | Status: AC
  Filled 2016-08-16: qty 2

## 2016-08-16 MED ORDER — MIDAZOLAM HCL 2 MG/2ML IJ SOLN
INTRAMUSCULAR | Status: DC | PRN
  Administered 2016-08-16: 1 mg via INTRAVENOUS

## 2016-08-16 MED ORDER — FENTANYL CITRATE (PF) 100 MCG/2ML IJ SOLN
INTRAMUSCULAR | Status: DC | PRN
  Administered 2016-08-16: 25 ug via INTRAVENOUS

## 2016-08-16 MED ORDER — MIDAZOLAM HCL 2 MG/2ML IJ SOLN
INTRAMUSCULAR | Status: AC
Start: 2016-08-16 — End: 2016-08-16
  Filled 2016-08-16: qty 2

## 2016-08-16 MED ORDER — IODIXANOL 320 MG/ML IV SOLN
INTRAVENOUS | Status: DC | PRN
  Administered 2016-08-16: 90 mL via INTRA_ARTERIAL

## 2016-08-16 MED ORDER — SODIUM CHLORIDE 0.9 % IV SOLN: 250.0000 mL | INTRAVENOUS | Status: DC | PRN

## 2016-08-16 MED ORDER — ASPIRIN 81 MG PO CHEW
CHEWABLE_TABLET | ORAL | Status: AC
  Filled 2016-08-16: qty 1

## 2016-08-16 SURGICAL SUPPLY — 12 items
CATH ANGIO 5F PIGTAIL 65CM (CATHETERS) ×1 IMPLANT
CATH CROSS OVER TEMPO 5F (CATHETERS) ×1 IMPLANT
CATH STRAIGHT 5FR 65CM (CATHETERS) ×1 IMPLANT
KIT MICROINTRODUCER STIFF 5F (SHEATH) ×1 IMPLANT
KIT PV (KITS) ×2 IMPLANT
SHEATH PINNACLE 5F 10CM (SHEATH) ×1 IMPLANT
STOPCOCK MORSE 400PSI 3WAY (MISCELLANEOUS) ×1 IMPLANT
SYRINGE MEDRAD AVANTA MACH 7 (SYRINGE) ×1 IMPLANT
TRANSDUCER W/STOPCOCK (MISCELLANEOUS) ×2 IMPLANT
TRAY PV CATH (CUSTOM PROCEDURE TRAY) ×2 IMPLANT
TUBING CIL FLEX 10 FLL-RA (TUBING) ×1 IMPLANT
WIRE HITORQ VERSACORE ST 145CM (WIRE) ×1 IMPLANT

## 2016-08-16 NOTE — Interval H&P Note (Signed)
History and Physical Interval Note:  08/16/2016 10:06 AM  [name] [name]  has presented today for surgery, with the diagnosis of pvd  The various methods of treatment have been discussed with the patient and family. After consideration of risks, benefits and other options for treatment, the patient has consented to  Procedure(s): Abdominal Aortogram w/Lower Extremity (N/A) as a surgical intervention .  The patient's history has been reviewed, patient examined, no change in status, stable for surgery.  I have reviewed the patient's chart and labs.  Questions were answered to the patient's satisfaction.     Kathlyn Sacramento

## 2016-08-16 NOTE — Discharge Instructions (Signed)

## 2016-08-16 NOTE — Progress Notes (Signed)
Site area: left groin fa sheath Site Prior to Removal:  Level 0 Pressure Applied For: 20 minutes Manual:   yes Patient Status During Pull:  stable Post Pull Site:  Level 0 Post Pull Instructions Given:  yes Post Pull Pulses Present: dopplered Dressing Applied:  Gauze and tegaderm Bedrest begins @ 9030 Comments:

## 2016-08-16 NOTE — H&P (View-Only) (Signed)
Cardiology Office Note   Date:  08/08/2016   ID:  [name] [name], DOB 1931/06/09, MRN [id number]  PCP:  Philmore Pali, NP  Cardiologist:  Dr. Gwenlyn Found   Chief Complaint  Patient presents with  . other    PV consult per Dr. Dellia Nims from the [facility]. Pt. c/o a wound on her right heel for about a month and a half. Meds reviewed by the pt. verbally.       History of Present Illness: [name] [name] is a 83 y.o. female who was referred by Dr. Dellia Nims for evaluation of nonhealing ulcer on the right foot with known history of peripheral arterial disease. She has extensive medical problems that include coronary artery disease status post CABG in 2008, hypertension, chronic kidney disease, hyperlipidemia, chronic diastolic heart failure and prior CVA. She is mostly wheelchair bound and her mobility is very limited. She was diagnosed with endometrial cancer earlier this year and was treated at [facility]. Plavix was discontinued at that time due to vaginal bleeding which has mostly resolved since then. The patient has known history of peripheral arterial disease with previous critical limb ischemia affecting the left lower extremity. She was treated by Dr. dew in 2014 with balloon angioplasty of the left SFA, popliteal artery and TP trunk.  She developed right heel ulceration about 2 months  ago which has not improved with wound care. She also noted recent small ulceration between the fourth and fifth toes on the right. This was followed by development of smaller left heel ulceration. She underwent noninvasive vascular studies in our office recently which showed an ABI of 0.52 on the right and 0.83 on the left.  Duplex showed probable right mid SFA occlusion and occluded left TP trunk with 1 vessel runoff below the knee.   Past Medical History:  Diagnosis Date  . CAD (coronary artery disease) 2008   a. s/p CABG in 2008 with LIMA-LAD, SVG-D1-OM1, and SVG-PDA  . CHF (congestive heart  failure) ([place])   . Claudication in peripheral vascular disease ([place]) 05/2012    ABI right 0.81; left 0.98   . CVA (cerebral infarction) 2014  . Daytime somnolence     hoping CPAP will be able to help this  . Diabetes mellitus type 2 with complications, uncontrolled (HCC)    CAD, CVA  . H/O cardiovascular stress test 06/2012   Stable with lateral scar and moderate. Infarction ischemia  . H/O gastritis   . HTN (hypertension)   . Hyperlipidemia LDL goal < 70   . Mallory-Weiss tear    History of  . Obesity (BMI 30.0-34.9)   . OSA on CPAP 2014    patient is just now beginning CPAP    Past Surgical History:  Procedure Laterality Date  . CHOLECYSTECTOMY    . CORONARY ARTERY BYPASS GRAFT  12/31/2006   CABG x4, LIMA-LAD, sequential VG-diagonal branch of LAD, OM of left circumflex & PLA branch of RCA     Current Outpatient Prescriptions  Medication Sig Dispense Refill  . ACCU-CHEK SOFTCLIX LANCETS lancets CHECK BLOOD SUGARS TWICE DAILY 200 each 12  . aspirin 81 MG tablet Take 81 mg by mouth daily.    . bisacodyl (DULCOLAX) 5 MG EC tablet Take 5 mg by mouth daily as needed for moderate constipation.    . carvedilol (COREG) 6.25 MG tablet Take 6.25 mg by mouth 2 (two) times daily with a meal.    . furosemide (LASIX) 40 MG tablet TAKE 2  TABLETS BY MOUTH EVERY MORNING FOR FLUID 60 tablet 6  . insulin aspart (NOVOLOG FLEXPEN) 100 UNIT/ML FlexPen Inject into the skin 3 (three) times daily with meals. Sliding Scale 2 units-6 units patient has list for 100-400 BS readings  100-200, 2 units, 201-250, 4 units, 251--300 6 units, 301-350, 8 units, 351 to 400 10 units.    . Insulin NPH Isophane & Regular (NOVOLIN 70/30 [place]) Inject 40-50 Units into the skin. 50 units every morning  50 units every evening    . Insulin Pen Needle (RELION SHORT PEN NEEDLES) 31G X 8 MM MISC 1 each by Does not apply route 2 (two) times daily. 50 each 11  . lisinopril (PRINIVIL,ZESTRIL) 40 MG tablet TAKE 1 TABLET DAILY  (Patient not taking: Reported on 08/02/2016) 30 tablet 0  . medroxyPROGESTERone (PROVERA) 5 MG tablet Take 5 mg by mouth daily.    . polyethylene glycol (MIRALAX / GLYCOLAX) packet USE 1-2 PACKETS DAILY 60 packet 0  . pravastatin (PRAVACHOL) 40 MG tablet TAKE 1 TABLET BY MOUTH AT BEDTIME 30 tablet 3  . ranitidine (ZANTAC) 150 MG tablet Take 150 mg by mouth 2 (two) times daily.     No current facility-administered medications for this visit.     Allergies:   Patient has no known allergies.    Social History:  The patient  reports that she has quit smoking. She has never used smokeless tobacco. She reports that she does not drink alcohol or use drugs.   Family History:  The patient's family history includes Diabetes in her brother and sister; Stroke in her mother.    ROS:  Please see the history of present illness.   Otherwise, review of systems are positive for none.   All other systems are reviewed and negative.    PHYSICAL EXAM: VS:  BP 132/60 (BP Location: Right Arm, Patient Position: Sitting, Cuff Size: Large)   Pulse 60   Ht 5\' 3"  (1.6 m)   Wt 203 lb (92.1 kg)   BMI 35.96 kg/m  , BMI Body mass index is 35.96 kg/m. GEN: Well nourished, well developed, in no acute distress  HEENT: normal  Neck: no JVD, carotid bruits, or masses Cardiac: RRR; no  rubs, or gallops mild  edema . 2/6 systolic ejection murmur at the aortic area.  Respiratory:  clear to auscultation bilaterally, normal work of breathing GI: soft, nontender, nondistended, + BS MS: no deformity or atrophy  Skin: warm and dry, no rash Neuro:  Strength and sensation are intact Psych: euthymic mood, full affect The patient just came back from the wound clinic and her legs were just wrapped.  Distal pulses are not palpable.  EKG:  EKG is not ordered today.    Recent Labs: 07/03/2016: ALT 12; BUN 30; Creatinine, Ser 1.82; Hemoglobin 11.6; Platelets 313; Potassium 4.1; Sodium 139    Lipid Panel    Component Value  Date/Time   CHOL 142 12/11/2014 1005   CHOL 102 12/12/2012 0419   TRIG 95 12/11/2014 1005   TRIG 65 12/12/2012 0419   HDL 37 (L) 12/11/2014 1005   HDL 32 (L) 12/12/2012 0419   CHOLHDL 3.8 12/11/2014 1005   VLDL 13 12/12/2012 0419   LDLCALC 86 12/11/2014 1005   LDLCALC 57 12/12/2012 0419      Wt Readings from Last 3 Encounters:  08/08/16 203 lb (92.1 kg)  07/03/16 203 lb (92.1 kg)  04/27/16 203 lb 8 oz (92.3 kg)        PAD  Screen 08/08/2016  Previous surgical procedure? Yes  Dates of procedures stent [facility]   Pain with walking? No  Subsides with rest? No  Feet/toe relief with dangling? Yes  Painful, non-healing ulcers? Yes  Extremities discolored? No      ASSESSMENT AND PLAN:  1.  Peripheral arterial disease with critical limb ischemia affecting both legs worse on the right side: The patient is at high risk for limb loss and amputation considering her comorbidities and significant peripheral arterial disease. I discussed different management options and recommend proceeding with angiography and possible endovascular intervention. She is not a candidate for any surgical revascularization. Even endovascular intervention is going to be associated with high risk of complications considering her comorbidities and chronic kidney disease with risk of contrast-induced nephropathy. This was explained to the patient and her family. We will have to bring her early for hydration and consider using CO2. We will focus on treating the right leg first which is worse. Then we might consider staged angiography of the left lower extremity.  2. Coronary artery disease involving native coronary arteries without angina: Continue medical therapy.  3. Chronic diastolic heart failure: Hold a 6 the morning of the procedure.  4. Chronic kidney disease, most recent creatinine was 1.82. Minimize contrast use and hydrated before and after.   Disposition:   FU with me in 1  month  Signed,  Kathlyn Sacramento, MD  08/08/2016 5:11 PM    [place]

## 2016-08-17 ENCOUNTER — Telehealth: Payer: Self-pay | Admitting: Pharmacist

## 2016-08-17 ENCOUNTER — Encounter (HOSPITAL_COMMUNITY): Payer: Self-pay | Admitting: Cardiovascular Disease

## 2016-08-17 DIAGNOSIS — L89612 Pressure ulcer of right heel, stage 2: Secondary | ICD-10-CM | POA: Diagnosis not present

## 2016-08-17 DIAGNOSIS — I110 Hypertensive heart disease with heart failure: Secondary | ICD-10-CM | POA: Diagnosis not present

## 2016-08-17 DIAGNOSIS — I5042 Chronic combined systolic (congestive) and diastolic (congestive) heart failure: Secondary | ICD-10-CM | POA: Diagnosis not present

## 2016-08-17 DIAGNOSIS — S91114D Laceration without foreign body of right lesser toe(s) without damage to nail, subsequent encounter: Secondary | ICD-10-CM | POA: Diagnosis not present

## 2016-08-17 DIAGNOSIS — E1122 Type 2 diabetes mellitus with diabetic chronic kidney disease: Secondary | ICD-10-CM | POA: Diagnosis not present

## 2016-08-17 DIAGNOSIS — E1151 Type 2 diabetes mellitus with diabetic peripheral angiopathy without gangrene: Secondary | ICD-10-CM | POA: Diagnosis not present

## 2016-08-17 DIAGNOSIS — N183 Chronic kidney disease, stage 3 (moderate): Secondary | ICD-10-CM | POA: Diagnosis not present

## 2016-08-17 DIAGNOSIS — I69398 Other sequelae of cerebral infarction: Secondary | ICD-10-CM | POA: Diagnosis not present

## 2016-08-17 DIAGNOSIS — M6281 Muscle weakness (generalized): Secondary | ICD-10-CM | POA: Diagnosis not present

## 2016-08-17 NOTE — Telephone Encounter (Signed)
Lm with spouse to call back and reschedule LE ART

## 2016-08-17 NOTE — Patient Outreach (Deleted)
[facility]) Care Management  08/17/2016  [name] [name] 07/19/1931 [id number]

## 2016-08-17 NOTE — Patient Outreach (Addendum)
[facility]) Care Management  [place]   08/17/2016  EULINE KIMBLER 10/01/1931 [id number]  Subjective:  83 year old female referred to [facility] by [place] for medication assistance with insulin.  PMHx includes, but not limited to, PAD with previous critical limb ischemia treated with angiplasty, PVD, CAD s/p CABG '08, HTN, HLD, chronic kidney disease stage III, diabetes type 2,  chronic diastolic HF (EF= 51-76% 1'60 ), prior CVA, and endometrial CA.  Noted patient had abdominal aortogram on 08/16/16 of lower extremities due to non-healing ulcer on right foot, recommendation to continue medical therapy and wound care for now, if no improvement in wound, high risk endovascular intervention can be attempted.    Successful telephone encounter with patient. HIPAA identifiers verified.  -Patient states she has her prescriptions blisterpacked and delivered by [facility].  Home health nurse from [facility] at house during visit.  RN states she has found several pills from blister pack on the floor near the garbage Tuesday and Thursday during her visit and that patient is using the trash can to dispose of her pen needles for insulin. Patient states she rarely uses her Novolog insulin and has been using the same pen for months.  She states sometimes the pills pop out of the blisterpack and land on the floor so she throws them away. Patient states she does sometimes forget to take her afternoon furosemide. Patient states she checks her blood sugar twice a day, usually in the morning and in the evening.     -Patient states her insulin copay has gone up since June and that she is in the coverage gap.  Patient's pharmacy confirmed patient paid $185.60 for 15 day supply of insulin on 07/29/16.  Patient's son picks up insulin for patient since she only afford 2 week supply at at time.  Patient's TROOP according to [facility] is [phone].  Patient estimates her  monthly income = [phone] and her husband's monthly income = $1500.    Objective:   Encounter Medications: Outpatient Encounter Prescriptions as of 08/17/2016  Medication Sig Note  . ACCU-CHEK SOFTCLIX LANCETS lancets CHECK BLOOD SUGARS TWICE DAILY   . acetaminophen (TYLENOL) 325 MG tablet Take 650 mg by mouth every 6 (six) hours as needed for mild pain.   [name] aspirin 81 MG tablet Take 81 mg by mouth daily.   . bisacodyl (DULCOLAX) 5 MG EC tablet Take 5 mg by mouth daily as needed for moderate constipation.   . carvedilol (COREG) 25 MG tablet Take 25 mg by mouth 2 (two) times daily.   . furosemide (LASIX) 40 MG tablet TAKE 2 TABLETS BY MOUTH EVERY MORNING FOR FLUID (Patient taking differently: TAKE 2 TABLETS BY MOUTH TWICE DAILY) 11/30/2015: 2 tablets in the morning and 2 tablets mid day   . insulin aspart (NOVOLOG FLEXPEN) 100 UNIT/ML FlexPen Inject into the skin 3 (three) times daily with meals. Sliding Scale 2 units-6 units patient has list for 100-400 BS readings  100-200, 2 units, 201-250, 4 units, 251--300 6 units, 301-350, 8 units, 351 to 400 10 units. 08/02/2016: Has on hand if needed  . Insulin NPH Isophane & Regular (NOVOLIN 70/30 [place]) Inject 40-50 Units into the skin. 50 units every morning  50 units every evening 08/02/2016: Taking 56 units in the morning and evening   . Insulin Pen Needle (RELION SHORT PEN NEEDLES) 31G X 8 MM MISC 1 each by Does not apply route 2 (two) times  daily.   . Polyethyl Glycol-Propyl Glycol (SYSTANE) 0.4-0.3 % SOLN Place 1 drop into both eyes daily as needed (dry eyes).   . polyethylene glycol (MIRALAX / GLYCOLAX) packet USE 1-2 PACKETS DAILY   . pravastatin (PRAVACHOL) 40 MG tablet TAKE 1 TABLET BY MOUTH AT BEDTIME   . ranitidine (ZANTAC) 150 MG tablet Take 150 mg by mouth 2 (two) times daily.    No facility-administered encounter medications on file as of 08/17/2016.     Functional Status: In your present state of health, do you have any difficulty performing  the following activities: 08/16/2016 06/21/2016  Hearing? N N  Vision? N Y  Difficulty concentrating or making decisions? N Y  Walking or climbing stairs? N Y  Dressing or bathing? N Y  Doing errands, shopping? - Y  Preparing Food and eating ? - Y  Using the Toilet? - Y  In the past six months, have you accidently leaked urine? - Y  Do you have problems with loss of bowel control? - N  Managing your Medications? - Y  Managing your Finances? - Y  Housekeeping or managing your Housekeeping? - Y  Some recent data might be hidden    Fall/Depression Screening: Fall Risk  06/21/2016 05/26/2016 02/17/2016  Falls in the past year? Yes No Yes  Number falls in past yr: 1 - 1  Injury with Fall? No - No  Risk for fall due to : Impaired balance/gait;Impaired mobility Impaired vision;Impaired mobility;Impaired balance/gait Impaired balance/gait;Impaired mobility;History of fall(s)  Follow up Falls evaluation completed;Falls prevention discussed;Education provided - Falls prevention discussed   PHQ 2/9 Scores 06/21/2016 05/26/2016 04/05/2016 12/28/2015 11/22/2015 11/18/2015 02/23/2015  PHQ - 2 Score 1 1 1  0 1 1 0      Assessment: Medication review performed telephonically.   Drugs sorted by system:  Cardiovascular: Aspirin 81mg , carvedilol, furosemide, pravastatin  Gastrointestinal: polyethylene glycol, bisacodyl, ranitidine  Endocrine:Novolog, Novolog mix 70/30  Topical: Polyethyl glycol-propyl glycol eye drops  Pain: Acetaminophen  Issues noted:  1.  Insulin:  -Patient unsure how to take her Novolog sliding scale insulin.  She will take 2 units if CBG > 300 or 3 units if > 400.   This does not match directions in [facility] or from prescription from [facility].  She is also using expired Novolog pen.   -Patient taking Novolog 70/30 differently from prescription.  She is taking 50 units every morning and evening while prescription written for 55 units every morning and 45 units every evening.    -Patient throwing away needles in garbage.  Counseled patient on using a plastic milk jug or laundry detergent bottle for needles rather than the trash.    2.  Updated medications:    -carvedilol dose changed to 6.25mg  BID  -furosemide dose changed to to 80mg  qAM and 80mg  at noon  3.  Patient missing doses of medications due to forgetfulness and due to pills falling on the floor.  Recommended patient push out pills into a bowl or onto a towel so medications do not bounce off table.  Discussed medication alarm system for patient and she is agreeable to try this out.    4.  Medication cost assistance for insulin:  -Patient in coverage gap.  Per preliminary review of annual income with patient, she is eligible for patient assistance programs through [employer].  However, patient needs to spend > $1000 on prescriptions in 2018 and currently has only spent $896.85.  Once patient meets TROOP, we can complete patient  assistance application for insulin.   -Patient may qualify for Extra Help / LIS.    5.  No ACE inhibitor or ARB with history of CAD, CHF.  Noted per review of [facility] notes / patient report that lisinopril recently stopped per nephrologist due to kidney function.   Plan: Route note to provider and f/u on clarification of insulin dose for Novolog 70/30 and sliding scale Novolog Update patient on TROOP requirement.  Schedule home visit to complete applications and bring medication alarm after patient meets TROOP.   Apply for LIS / Extra Help with patient over the phone.    Ralene Bathe, PharmD, [place] [phone]

## 2016-08-18 ENCOUNTER — Other Ambulatory Visit: Payer: Self-pay | Admitting: Pharmacist

## 2016-08-18 DIAGNOSIS — I5042 Chronic combined systolic (congestive) and diastolic (congestive) heart failure: Secondary | ICD-10-CM | POA: Diagnosis not present

## 2016-08-18 DIAGNOSIS — I69398 Other sequelae of cerebral infarction: Secondary | ICD-10-CM | POA: Diagnosis not present

## 2016-08-18 DIAGNOSIS — E1122 Type 2 diabetes mellitus with diabetic chronic kidney disease: Secondary | ICD-10-CM | POA: Diagnosis not present

## 2016-08-18 DIAGNOSIS — M6281 Muscle weakness (generalized): Secondary | ICD-10-CM | POA: Diagnosis not present

## 2016-08-18 DIAGNOSIS — L89612 Pressure ulcer of right heel, stage 2: Secondary | ICD-10-CM | POA: Diagnosis not present

## 2016-08-18 DIAGNOSIS — I110 Hypertensive heart disease with heart failure: Secondary | ICD-10-CM | POA: Diagnosis not present

## 2016-08-18 DIAGNOSIS — N183 Chronic kidney disease, stage 3 (moderate): Secondary | ICD-10-CM | POA: Diagnosis not present

## 2016-08-18 DIAGNOSIS — E1151 Type 2 diabetes mellitus with diabetic peripheral angiopathy without gangrene: Secondary | ICD-10-CM | POA: Diagnosis not present

## 2016-08-18 DIAGNOSIS — S91114D Laceration without foreign body of right lesser toe(s) without damage to nail, subsequent encounter: Secondary | ICD-10-CM | POA: Diagnosis not present

## 2016-08-18 NOTE — Patient Outreach (Signed)
[facility] [facility]) Care Management  08/18/2016  BIANCE MONCRIEF [birth year] [id number]  Successful telephone encounter with patient. HIPAA identifiers verified. Discussed with patient that once she meets >$1000 out of pocket spend for 2018 on medications, we can apply to [employer] for patient assistance for her insulin.  Patient thinks she will meet this after she picks up insulin in 2 weeks.    Based on updated income from husband and patient (household of 2), patient not likely to meet LIS / Extra Help.  Plan: Follow-up with patient in 2 weeks.    Ralene Bathe, PharmD, [place] [phone]

## 2016-08-20 DIAGNOSIS — E1122 Type 2 diabetes mellitus with diabetic chronic kidney disease: Secondary | ICD-10-CM | POA: Diagnosis not present

## 2016-08-20 DIAGNOSIS — I5042 Chronic combined systolic (congestive) and diastolic (congestive) heart failure: Secondary | ICD-10-CM | POA: Diagnosis not present

## 2016-08-20 DIAGNOSIS — L89612 Pressure ulcer of right heel, stage 2: Secondary | ICD-10-CM | POA: Diagnosis not present

## 2016-08-20 DIAGNOSIS — N183 Chronic kidney disease, stage 3 (moderate): Secondary | ICD-10-CM | POA: Diagnosis not present

## 2016-08-20 DIAGNOSIS — I69398 Other sequelae of cerebral infarction: Secondary | ICD-10-CM | POA: Diagnosis not present

## 2016-08-20 DIAGNOSIS — I110 Hypertensive heart disease with heart failure: Secondary | ICD-10-CM | POA: Diagnosis not present

## 2016-08-20 DIAGNOSIS — E1151 Type 2 diabetes mellitus with diabetic peripheral angiopathy without gangrene: Secondary | ICD-10-CM | POA: Diagnosis not present

## 2016-08-20 DIAGNOSIS — S91114D Laceration without foreign body of right lesser toe(s) without damage to nail, subsequent encounter: Secondary | ICD-10-CM | POA: Diagnosis not present

## 2016-08-20 DIAGNOSIS — M6281 Muscle weakness (generalized): Secondary | ICD-10-CM | POA: Diagnosis not present

## 2016-08-21 DIAGNOSIS — I110 Hypertensive heart disease with heart failure: Secondary | ICD-10-CM | POA: Diagnosis not present

## 2016-08-21 DIAGNOSIS — I69398 Other sequelae of cerebral infarction: Secondary | ICD-10-CM | POA: Diagnosis not present

## 2016-08-21 DIAGNOSIS — E1151 Type 2 diabetes mellitus with diabetic peripheral angiopathy without gangrene: Secondary | ICD-10-CM | POA: Diagnosis not present

## 2016-08-21 DIAGNOSIS — E1122 Type 2 diabetes mellitus with diabetic chronic kidney disease: Secondary | ICD-10-CM | POA: Diagnosis not present

## 2016-08-21 DIAGNOSIS — N183 Chronic kidney disease, stage 3 (moderate): Secondary | ICD-10-CM | POA: Diagnosis not present

## 2016-08-21 DIAGNOSIS — M6281 Muscle weakness (generalized): Secondary | ICD-10-CM | POA: Diagnosis not present

## 2016-08-21 DIAGNOSIS — I5042 Chronic combined systolic (congestive) and diastolic (congestive) heart failure: Secondary | ICD-10-CM | POA: Diagnosis not present

## 2016-08-21 DIAGNOSIS — S91114D Laceration without foreign body of right lesser toe(s) without damage to nail, subsequent encounter: Secondary | ICD-10-CM | POA: Diagnosis not present

## 2016-08-21 DIAGNOSIS — L89612 Pressure ulcer of right heel, stage 2: Secondary | ICD-10-CM | POA: Diagnosis not present

## 2016-08-23 ENCOUNTER — Ambulatory Visit: Payer: Medicare HMO | Admitting: Internal Medicine

## 2016-08-23 ENCOUNTER — Other Ambulatory Visit: Payer: Self-pay

## 2016-08-23 DIAGNOSIS — R531 Weakness: Secondary | ICD-10-CM | POA: Diagnosis not present

## 2016-08-23 DIAGNOSIS — S91114D Laceration without foreign body of right lesser toe(s) without damage to nail, subsequent encounter: Secondary | ICD-10-CM | POA: Diagnosis not present

## 2016-08-23 DIAGNOSIS — I69398 Other sequelae of cerebral infarction: Secondary | ICD-10-CM | POA: Diagnosis not present

## 2016-08-23 DIAGNOSIS — R32 Unspecified urinary incontinence: Secondary | ICD-10-CM | POA: Diagnosis not present

## 2016-08-23 DIAGNOSIS — Z8673 Personal history of transient ischemic attack (TIA), and cerebral infarction without residual deficits: Secondary | ICD-10-CM | POA: Diagnosis not present

## 2016-08-23 DIAGNOSIS — C541 Malignant neoplasm of endometrium: Secondary | ICD-10-CM | POA: Diagnosis not present

## 2016-08-23 DIAGNOSIS — I5042 Chronic combined systolic (congestive) and diastolic (congestive) heart failure: Secondary | ICD-10-CM | POA: Diagnosis not present

## 2016-08-23 DIAGNOSIS — E1151 Type 2 diabetes mellitus with diabetic peripheral angiopathy without gangrene: Secondary | ICD-10-CM | POA: Diagnosis not present

## 2016-08-23 DIAGNOSIS — L89612 Pressure ulcer of right heel, stage 2: Secondary | ICD-10-CM | POA: Diagnosis not present

## 2016-08-23 DIAGNOSIS — I110 Hypertensive heart disease with heart failure: Secondary | ICD-10-CM | POA: Diagnosis not present

## 2016-08-23 DIAGNOSIS — E1122 Type 2 diabetes mellitus with diabetic chronic kidney disease: Secondary | ICD-10-CM | POA: Diagnosis not present

## 2016-08-23 DIAGNOSIS — N183 Chronic kidney disease, stage 3 (moderate): Secondary | ICD-10-CM | POA: Diagnosis not present

## 2016-08-23 DIAGNOSIS — N939 Abnormal uterine and vaginal bleeding, unspecified: Secondary | ICD-10-CM | POA: Diagnosis not present

## 2016-08-23 DIAGNOSIS — M6281 Muscle weakness (generalized): Secondary | ICD-10-CM | POA: Diagnosis not present

## 2016-08-23 DIAGNOSIS — I129 Hypertensive chronic kidney disease with stage 1 through stage 4 chronic kidney disease, or unspecified chronic kidney disease: Secondary | ICD-10-CM | POA: Diagnosis not present

## 2016-08-23 DIAGNOSIS — N189 Chronic kidney disease, unspecified: Secondary | ICD-10-CM | POA: Diagnosis not present

## 2016-08-23 DIAGNOSIS — I2510 Atherosclerotic heart disease of native coronary artery without angina pectoris: Secondary | ICD-10-CM | POA: Diagnosis not present

## 2016-08-23 DIAGNOSIS — E669 Obesity, unspecified: Secondary | ICD-10-CM | POA: Diagnosis not present

## 2016-08-23 DIAGNOSIS — C55 Malignant neoplasm of uterus, part unspecified: Secondary | ICD-10-CM | POA: Diagnosis not present

## 2016-08-23 NOTE — Patient Outreach (Signed)
Telephone assessment:  Received a message to call patient.  Placed call to patient's home number. No answer. Placed call to cell phone. Daughter Frenchie answered phone and states that patient is at [facility] for bleeding.  PLAN: will continue to try to reach patient. Will update assigned case manager.  Tomasa Rand, RN, BSN, CEN [facility] [employer] [phone]

## 2016-08-25 ENCOUNTER — Other Ambulatory Visit: Payer: Self-pay

## 2016-08-25 DIAGNOSIS — I5042 Chronic combined systolic (congestive) and diastolic (congestive) heart failure: Secondary | ICD-10-CM | POA: Diagnosis not present

## 2016-08-25 DIAGNOSIS — M6281 Muscle weakness (generalized): Secondary | ICD-10-CM | POA: Diagnosis not present

## 2016-08-25 DIAGNOSIS — I69398 Other sequelae of cerebral infarction: Secondary | ICD-10-CM | POA: Diagnosis not present

## 2016-08-25 DIAGNOSIS — N183 Chronic kidney disease, stage 3 (moderate): Secondary | ICD-10-CM | POA: Diagnosis not present

## 2016-08-25 DIAGNOSIS — L89612 Pressure ulcer of right heel, stage 2: Secondary | ICD-10-CM | POA: Diagnosis not present

## 2016-08-25 DIAGNOSIS — E1122 Type 2 diabetes mellitus with diabetic chronic kidney disease: Secondary | ICD-10-CM | POA: Diagnosis not present

## 2016-08-25 DIAGNOSIS — I110 Hypertensive heart disease with heart failure: Secondary | ICD-10-CM | POA: Diagnosis not present

## 2016-08-25 DIAGNOSIS — S91114D Laceration without foreign body of right lesser toe(s) without damage to nail, subsequent encounter: Secondary | ICD-10-CM | POA: Diagnosis not present

## 2016-08-25 DIAGNOSIS — E1151 Type 2 diabetes mellitus with diabetic peripheral angiopathy without gangrene: Secondary | ICD-10-CM | POA: Diagnosis not present

## 2016-08-25 NOTE — Patient Outreach (Signed)
Telephone follow up: Placed follow up call to patient after discharged home from the  emergency department.   Patient reports slight bleeding. Denies any other symtoms.  Reminded patient to call MD for evaluation.    PLAN: will update assigned case manager. Encouraged patient to make a follow up appointment. Reviewed with patient to go to the hospital if she has large amounts of bleeding, dizziness or weakness. She voiced understanding.  Tomasa Rand, RN, BSN, CEN [facility] [employer] [phone]

## 2016-08-28 ENCOUNTER — Other Ambulatory Visit (INDEPENDENT_AMBULATORY_CARE_PROVIDER_SITE_OTHER): Payer: Self-pay | Admitting: Vascular Surgery

## 2016-08-28 ENCOUNTER — Other Ambulatory Visit: Payer: Self-pay | Admitting: *Deleted

## 2016-08-28 DIAGNOSIS — N183 Chronic kidney disease, stage 3 (moderate): Secondary | ICD-10-CM | POA: Diagnosis not present

## 2016-08-28 DIAGNOSIS — I739 Peripheral vascular disease, unspecified: Principal | ICD-10-CM

## 2016-08-28 DIAGNOSIS — I110 Hypertensive heart disease with heart failure: Secondary | ICD-10-CM | POA: Diagnosis not present

## 2016-08-28 DIAGNOSIS — E1151 Type 2 diabetes mellitus with diabetic peripheral angiopathy without gangrene: Secondary | ICD-10-CM | POA: Diagnosis not present

## 2016-08-28 DIAGNOSIS — I69398 Other sequelae of cerebral infarction: Secondary | ICD-10-CM | POA: Diagnosis not present

## 2016-08-28 DIAGNOSIS — M6281 Muscle weakness (generalized): Secondary | ICD-10-CM | POA: Diagnosis not present

## 2016-08-28 DIAGNOSIS — L89612 Pressure ulcer of right heel, stage 2: Secondary | ICD-10-CM | POA: Diagnosis not present

## 2016-08-28 DIAGNOSIS — E1122 Type 2 diabetes mellitus with diabetic chronic kidney disease: Secondary | ICD-10-CM | POA: Diagnosis not present

## 2016-08-28 DIAGNOSIS — I779 Disorder of arteries and arterioles, unspecified: Secondary | ICD-10-CM

## 2016-08-28 DIAGNOSIS — I5042 Chronic combined systolic (congestive) and diastolic (congestive) heart failure: Secondary | ICD-10-CM | POA: Diagnosis not present

## 2016-08-28 DIAGNOSIS — S91114D Laceration without foreign body of right lesser toe(s) without damage to nail, subsequent encounter: Secondary | ICD-10-CM | POA: Diagnosis not present

## 2016-08-28 NOTE — Patient Outreach (Signed)
[place]) Care Management  08/28/2016  AMARYS SLIWINSKI 1931/12/05 [id number]  Telephone follow up call  Placed call to patient home number, no answer able to leave a hipaa compliant message requesting a return call.  Placed call to patient daughter [name], she discussed she has spoken with patient earlier today and she was doing okay.  Daughter discussed patient visit to emergency room due to vaginal bleeding on last week, denies complaint of increased bleeding during conversation with her this morning. Daughter  discussed patient upcoming appointments with vascular follow up on this week, wound clinic next week and [facility] GYN clinic in August. [name] discussed patient is supposed to make her follow up visit with PCP on this week. Daughter has arranged transportation to appointments using [place] transportation to visit and she will meet patient at offices and provide return transportation.   Plan Will attempt contact patient within this week  to discuss  care management follow up needs.   [name], RN, [place] Management [phone]- Mobile [phone]- Toll Free Main Office

## 2016-08-29 ENCOUNTER — Encounter (INDEPENDENT_AMBULATORY_CARE_PROVIDER_SITE_OTHER): Payer: Self-pay

## 2016-08-29 ENCOUNTER — Ambulatory Visit (INDEPENDENT_AMBULATORY_CARE_PROVIDER_SITE_OTHER): Payer: Medicare HMO

## 2016-08-29 ENCOUNTER — Encounter (INDEPENDENT_AMBULATORY_CARE_PROVIDER_SITE_OTHER): Payer: Self-pay | Admitting: Vascular Surgery

## 2016-08-29 ENCOUNTER — Ambulatory Visit (INDEPENDENT_AMBULATORY_CARE_PROVIDER_SITE_OTHER): Payer: Medicare HMO | Admitting: Vascular Surgery

## 2016-08-29 ENCOUNTER — Encounter (INDEPENDENT_AMBULATORY_CARE_PROVIDER_SITE_OTHER): Payer: Medicare HMO

## 2016-08-29 VITALS — BP 156/61 | HR 61 | Resp 16 | Ht 63.0 in | Wt 204.0 lb

## 2016-08-29 DIAGNOSIS — I739 Peripheral vascular disease, unspecified: Principal | ICD-10-CM

## 2016-08-29 DIAGNOSIS — R6889 Other general symptoms and signs: Secondary | ICD-10-CM | POA: Diagnosis not present

## 2016-08-29 DIAGNOSIS — E785 Hyperlipidemia, unspecified: Secondary | ICD-10-CM

## 2016-08-29 DIAGNOSIS — E1165 Type 2 diabetes mellitus with hyperglycemia: Secondary | ICD-10-CM

## 2016-08-29 DIAGNOSIS — I7025 Atherosclerosis of native arteries of other extremities with ulceration: Secondary | ICD-10-CM | POA: Diagnosis not present

## 2016-08-29 DIAGNOSIS — Z794 Long term (current) use of insulin: Secondary | ICD-10-CM

## 2016-08-29 DIAGNOSIS — I6523 Occlusion and stenosis of bilateral carotid arteries: Secondary | ICD-10-CM

## 2016-08-29 DIAGNOSIS — I779 Disorder of arteries and arterioles, unspecified: Secondary | ICD-10-CM

## 2016-08-29 DIAGNOSIS — I10 Essential (primary) hypertension: Secondary | ICD-10-CM

## 2016-08-30 ENCOUNTER — Other Ambulatory Visit: Payer: Self-pay | Admitting: *Deleted

## 2016-08-30 DIAGNOSIS — M6281 Muscle weakness (generalized): Secondary | ICD-10-CM | POA: Diagnosis not present

## 2016-08-30 DIAGNOSIS — I5042 Chronic combined systolic (congestive) and diastolic (congestive) heart failure: Secondary | ICD-10-CM | POA: Diagnosis not present

## 2016-08-30 DIAGNOSIS — L89612 Pressure ulcer of right heel, stage 2: Secondary | ICD-10-CM | POA: Diagnosis not present

## 2016-08-30 DIAGNOSIS — I69398 Other sequelae of cerebral infarction: Secondary | ICD-10-CM | POA: Diagnosis not present

## 2016-08-30 DIAGNOSIS — E1122 Type 2 diabetes mellitus with diabetic chronic kidney disease: Secondary | ICD-10-CM | POA: Diagnosis not present

## 2016-08-30 DIAGNOSIS — N183 Chronic kidney disease, stage 3 (moderate): Secondary | ICD-10-CM | POA: Diagnosis not present

## 2016-08-30 DIAGNOSIS — S91114D Laceration without foreign body of right lesser toe(s) without damage to nail, subsequent encounter: Secondary | ICD-10-CM | POA: Diagnosis not present

## 2016-08-30 DIAGNOSIS — E1151 Type 2 diabetes mellitus with diabetic peripheral angiopathy without gangrene: Secondary | ICD-10-CM | POA: Diagnosis not present

## 2016-08-30 DIAGNOSIS — I110 Hypertensive heart disease with heart failure: Secondary | ICD-10-CM | POA: Diagnosis not present

## 2016-08-30 NOTE — Patient Outreach (Signed)
[facility]) Care Management  08/30/2016  [name] [name] 02-04-1932 [id number]  Telephone follow up call   Spoke with patient, states "she is making it " as usual when asked how she was doing.' Patient discussed her recent visit to Emergency room regarding bleeding vaginally. Patient reports she noted a little more blood on her diaper this morning and she has called PCP for office visit.  Patient reports home health RN visited on today and physical therapy plan for visit on tomorrow.   Patient reports she has follow wound clinic visit on next week and daughter has arranged transportation to visit. Patient discussed being eager to see how wound is healing and hopes she doesn't need any further stent procedures .   Patient reports she continues to check her blood sugars daily, unable to recall reading from this morning but states it was high "over 200" and she took 4 units of novolog insulin. Patient does not keep a written record of blood sugar readings . Patient denies having recent hypoglycemia episode. Reviewed signs and symptoms of hypoglycemia and treatment.   Patient requesting home visit for follow up.  Plan Will plan home visit in the next week, for continued education and management of chronic disease Diabetes and current wound  concerns .  Reinforced with patient importance of notifying MD of increased bleeding.   [name], RN, [place] Management [phone]- Mobile [phone]- Toll Free Main Office

## 2016-08-31 DIAGNOSIS — R269 Unspecified abnormalities of gait and mobility: Secondary | ICD-10-CM | POA: Diagnosis not present

## 2016-08-31 DIAGNOSIS — C541 Malignant neoplasm of endometrium: Secondary | ICD-10-CM | POA: Diagnosis not present

## 2016-08-31 DIAGNOSIS — I10 Essential (primary) hypertension: Secondary | ICD-10-CM | POA: Diagnosis not present

## 2016-08-31 DIAGNOSIS — R531 Weakness: Secondary | ICD-10-CM | POA: Diagnosis not present

## 2016-08-31 DIAGNOSIS — I6529 Occlusion and stenosis of unspecified carotid artery: Secondary | ICD-10-CM | POA: Insufficient documentation

## 2016-08-31 DIAGNOSIS — I7025 Atherosclerosis of native arteries of other extremities with ulceration: Secondary | ICD-10-CM | POA: Insufficient documentation

## 2016-08-31 DIAGNOSIS — Z6835 Body mass index (BMI) 35.0-35.9, adult: Secondary | ICD-10-CM | POA: Diagnosis not present

## 2016-08-31 DIAGNOSIS — I739 Peripheral vascular disease, unspecified: Secondary | ICD-10-CM | POA: Diagnosis not present

## 2016-08-31 DIAGNOSIS — E669 Obesity, unspecified: Secondary | ICD-10-CM | POA: Diagnosis not present

## 2016-08-31 DIAGNOSIS — E113593 Type 2 diabetes mellitus with proliferative diabetic retinopathy without macular edema, bilateral: Secondary | ICD-10-CM | POA: Diagnosis not present

## 2016-08-31 DIAGNOSIS — N184 Chronic kidney disease, stage 4 (severe): Secondary | ICD-10-CM | POA: Diagnosis not present

## 2016-08-31 NOTE — Assessment & Plan Note (Signed)
blood pressure control important in reducing the progression of atherosclerotic disease. On appropriate oral medications.  

## 2016-08-31 NOTE — Progress Notes (Signed)
Patient ID: [name] [name], female   DOB: May 30, 1931, 83 y.o.   MRN: [id number]  Chief Complaint  Patient presents with  . Carotid    ABI ultrasound    HPI [name] [name] is a 83 y.o. female. The patient returns in follow-up multiple vascular issues. I have followed her for some time for her carotid disease, and a carotid duplex was performed today. She has not had any recent focal neurologic symptoms although she does have a remote history of stroke. Specifically, the patient denies amaurosis fugax, speech or swallowing difficulties, or arm or leg weakness or numbness. Her carotid duplex today reveals stable, 1-39% carotid artery stenosis bilaterally. A more pressing issue at this time are clearly lower extremity ulcerations which are poorly healing. I have previously performed a left lower extremity revascularization on her 3-4 years ago. She had been followed regularly for this. She has developed nonhealing wounds on both feet over the past several months. There is a left medial ankle wound and a right heel ulceration. She went to the wound care center who have perform debridements and local wound care for many weeks now. Somehow, she was referred to a cardiologist for evaluation of her peripheral arterial disease. A right SFA occlusion was identified on duplex prompting an angiogram 2-3 weeks ago. At that angiogram, no intervention was performed and no further planned for her significant peripheral arterial disease with ulceration was made apparently. I have reviewed the duplex study performed at their office which clearly shows an SFA occlusion and was known before the angiogram. Her family would like to discuss her lower extremity issues with me today as I cared for this previously. She does not have fever or chills.  She thinks maybe the wounds are getting better, but her reasoning for this is unclear.  Past Medical History:  Diagnosis Date  . CAD (coronary artery disease)  2008   a. s/p CABG in 2008 with LIMA-LAD, SVG-D1-OM1, and SVG-PDA  . CHF (congestive heart failure) ([place])   . Claudication in peripheral vascular disease ([place]) 05/2012    ABI right 0.81; left 0.98   . CVA (cerebral infarction) 2014  . Daytime somnolence     hoping CPAP will be able to help this  . Diabetes mellitus type 2 with complications, uncontrolled (HCC)    CAD, CVA  . H/O cardiovascular stress test 06/2012   Stable with lateral scar and moderate. Infarction ischemia  . H/O gastritis   . HTN (hypertension)   . Hyperlipidemia LDL goal < 70   . Mallory-Weiss tear    History of  . Obesity (BMI 30.0-34.9)   . OSA on CPAP 2014    patient is just now beginning CPAP    Past Surgical History:  Procedure Laterality Date  . ABDOMINAL AORTOGRAM W/LOWER EXTREMITY N/A 08/16/2016   Procedure: Abdominal Aortogram w/Lower Extremity;  Surgeon: Wellington Hampshire, MD;  Location: [facility];  Service: Cardiovascular;  Laterality: N/A;  . CHOLECYSTECTOMY    . CORONARY ARTERY BYPASS GRAFT  12/31/2006   CABG x4, LIMA-LAD, sequential VG-diagonal branch of LAD, OM of left circumflex & PLA branch of RCA    Family History  Problem Relation Age of Onset  . Stroke Mother   . Diabetes Sister   . Diabetes Brother   No bleeding or clotting disorders  Social History Social History  Substance Use Topics  . Smoking status: Former Research scientist (life sciences)  . Smokeless tobacco: Never Used     Comment:  50 years ago  . Alcohol use No  No IVDU  No Known Allergies  Current Outpatient Prescriptions  Medication Sig Dispense Refill  . ACCU-CHEK SOFTCLIX LANCETS lancets CHECK BLOOD SUGARS TWICE DAILY 200 each 12  . acetaminophen (TYLENOL) 325 MG tablet Take 650 mg by mouth every 6 (six) hours as needed for mild pain.    [name] aspirin 81 MG tablet Take 81 mg by mouth daily.    . bisacodyl (DULCOLAX) 5 MG EC tablet Take 5 mg by mouth daily as needed for moderate constipation.    . carvedilol (COREG) 6.25 MG tablet Take  6.25 mg by mouth 2 (two) times daily with a meal.    . furosemide (LASIX) 40 MG tablet Take 80 mg by mouth 2 (two) times daily.    . insulin aspart (NOVOLOG FLEXPEN) 100 UNIT/ML FlexPen Inject into the skin 3 (three) times daily with meals. Sliding Scale 2 units-6 units patient has list for 100-400 BS readings  100-200, 2 units, 201-250, 4 units, 251--300 6 units, 301-350, 8 units, 351 to 400 10 units.    . Insulin NPH Isophane & Regular (NOVOLIN 70/30 Talty) Inject 40-50 Units into the skin. 50 units every morning  50 units every evening    . Insulin Pen Needle (RELION SHORT PEN NEEDLES) 31G X 8 MM MISC 1 each by Does not apply route 2 (two) times daily. 50 each 11  . Polyethyl Glycol-Propyl Glycol (SYSTANE) 0.4-0.3 % SOLN Place 1 drop into both eyes daily as needed (dry eyes).    . polyethylene glycol (MIRALAX / GLYCOLAX) packet USE 1-2 PACKETS DAILY 60 packet 0  . pravastatin (PRAVACHOL) 40 MG tablet TAKE 1 TABLET BY MOUTH AT BEDTIME 30 tablet 3  . ranitidine (ZANTAC) 150 MG tablet Take 150 mg by mouth 2 (two) times daily.     No current facility-administered medications for this visit.       REVIEW OF SYSTEMS (Negative unless checked)  Constitutional: '[]' Weight loss  '[]' Fever  '[]' Chills Cardiac: '[]' Chest pain   '[]' Chest pressure   '[x]' Palpitations   '[]' Shortness of breath when laying flat   '[]' Shortness of breath at rest   '[]' Shortness of breath with exertion. Vascular:  '[]' Pain in legs with walking   '[]' Pain in legs at rest   '[]' Pain in legs when laying flat   '[]' Claudication   '[]' Pain in feet when walking  '[]' Pain in feet at rest  '[]' Pain in feet when laying flat   '[]' History of DVT   '[]' Phlebitis   '[]' Swelling in legs   '[]' Varicose veins   '[x]' Non-healing ulcers Pulmonary:   '[]' Uses home oxygen   '[]' Productive cough   '[]' Hemoptysis   '[]' Wheeze  '[]' COPD   '[]' Asthma Neurologic:  '[]' Dizziness  '[]' Blackouts   '[]' Seizures   '[x]' History of stroke   '[]' History of TIA  '[]' Aphasia   '[]' Temporary blindness   '[]' Dysphagia   '[]' Weakness  or numbness in arms   '[]' Weakness or numbness in legs Musculoskeletal:  '[]' Arthritis   '[]' Joint swelling   '[]' Joint pain   '[]' Low back pain Hematologic:  '[]' Easy bruising  '[]' Easy bleeding   '[]' Hypercoagulable state   '[]' Anemic  '[]' Hepatitis Gastrointestinal:  '[]' Blood in stool   '[]' Vomiting blood  '[x]' Gastroesophageal reflux/heartburn   '[x]' Abdominal pain Genitourinary:  '[x]' Chronic kidney disease   '[]' Difficult urination  '[]' Frequent urination  '[]' Burning with urination   '[]' Hematuria Skin:  '[]' Rashes   '[x]' Ulcers   '[x]' Wounds Psychological:  '[]' History of anxiety   '[]'  History of major depression.    Physical Exam BP (!) 156/61  Pulse 61   Resp 16   Ht '5\' 3"'  (1.6 m)   Wt 92.5 kg (204 lb)   BMI 36.14 kg/m  Gen:  WD/WN, NAD Head: [place]/AT, No temporalis wasting.  Ear/Nose/Throat: Hearing grossly intact, nares w/o erythema or drainage, oropharynx w/o Erythema/Exudate Eyes: Conjunctiva clear, sclera non-icteric  Neck: trachea midline.  No JVD.  Pulmonary:  Good air movement, no use of accessory muscles.  Respirations not labored. Cardiac: irregular. Vascular:  Vessel Right Left  Radial Palpable Palpable                      Popliteal Not Palpable 1+ Palpable  PT Not Palpable 1+ Palpable  DP Trace Palpable Not Palpable   Gastrointestinal: soft, non-tender/non-distended.  Musculoskeletal: Uses a wheelchair.  No obvious focal deficits.  Right heel ulceration about 2-3 cm in size.  Left medial malleolus wound small and clean. Neurologic: Sensation grossly intact in extremities.  Symmetrical.  Speech is fluent. Motor exam as listed above. Psychiatric: Judgment and insight are poor.  Family provides much of the history Dermatologic: Wounds as described above   Radiology No results found.  Labs Recent Results (from the past 2160 hour(s))  Comprehensive metabolic panel     Status: Abnormal   Collection Time: 07/03/16  6:47 PM  Result Value Ref Range   Sodium 139 135 - 145 mmol/L   Potassium 4.1 3.5  - 5.1 mmol/L   Chloride 104 101 - 111 mmol/L   CO2 23 22 - 32 mmol/L   Glucose, Bld 204 (H) 65 - 99 mg/dL   BUN 30 (H) 6 - 20 mg/dL   Creatinine, Ser 1.82 (H) 0.44 - 1.00 mg/dL   Calcium 9.3 8.9 - 10.3 mg/dL   Total Protein 7.1 6.5 - 8.1 g/dL   Albumin 3.6 3.5 - 5.0 g/dL   AST 17 15 - 41 U/L   ALT 12 (L) 14 - 54 U/L   Alkaline Phosphatase 149 (H) 38 - 126 U/L   Total Bilirubin 0.4 0.3 - 1.2 mg/dL   GFR calc non Af Amer 25 (L) >60 mL/min   GFR calc Af Amer 29 (L) >60 mL/min    Comment: (NOTE) The eGFR has been calculated using the CKD EPI equation. This calculation has not been validated in all clinical situations. eGFR's persistently <60 mL/min signify possible Chronic Kidney Disease.    Anion gap 12 5 - 15  CBC with Differential     Status: Abnormal   Collection Time: 07/03/16  6:47 PM  Result Value Ref Range   WBC 11.4 (H) 4.0 - 10.5 K/uL   RBC 4.44 3.87 - 5.11 MIL/uL   Hemoglobin 11.6 (L) 12.0 - 15.0 g/dL   HCT 37.0 36.0 - 46.0 %   MCV 83.3 78.0 - 100.0 fL   MCH 26.1 26.0 - 34.0 pg   MCHC 31.4 30.0 - 36.0 g/dL   RDW 16.8 (H) 11.5 - 15.5 %   Platelets 313 150 - 400 K/uL   Neutrophils Relative % 47 %   Neutro Abs 5.4 1.7 - 7.7 K/uL   Lymphocytes Relative 44 %   Lymphs Abs 5.0 (H) 0.7 - 4.0 K/uL   Monocytes Relative 5 %   Monocytes Absolute 0.6 0.1 - 1.0 K/uL   Eosinophils Relative 4 %   Eosinophils Absolute 0.4 0.0 - 0.7 K/uL   Basophils Relative 0 %   Basophils Absolute 0.0 0.0 - 0.1 K/uL  I-Stat CG4 Lactic Acid, ED  Status: None   Collection Time: 07/03/16  6:53 PM  Result Value Ref Range   Lactic Acid, Venous 1.70 0.5 - 1.9 mmol/L  VAS [place] LOWER EXTREMITY ARTERIAL DUPLEX     Status: None   Collection Time: 07/25/16  2:59 PM  Result Value Ref Range   Left super femoral prox sys PSV 123 cm/s   Left super femoral mid sys PSV -165 cm/s   Left super femoral dist sys PSV -168 cm/s   Left popliteal prox sys PSV 160 cm/s   Left popliteal dist sys PSV 92 cm/s    Right super femoral prox sys PSV -262 cm/s   Right super femoral mid sys PSV -230 cm/s   Right super femoral dist sys PSV -285 cm/s   Right popliteal prox sys PSV 180 cm/s   Right popliteal dist sys PSV -133 cm/s   Left ant tibial mid sys -81 cm/s   RIGHT ANT MID TIBIAL SYS PSV 59 cm/s   RIGHT POST TIB MID SYS 60 cm/s   RIGHT PERO MID SYS -91 cm/s   RIGHT PERO MID DIA -15 cm/s   LEFT SFA PROX DYS VEL 0 cm/s   LEFT SFA MID VEL 0 cm/s   LEFT SFA DIST DYS VEL 0 cm/s   LEFT POPLITEAL PROX DYS VEL 0 cm/s   RIGHT SUPER FEMORAL PROX EDV -32 cm/sec   RIGHT SUPER FEMORAL MID EDV -23 cm/sec   RIGHT SUPER FEMORAL DIST EDV -45 cm/sec   RIGHT POPLITEAL PROX EDV 0 cm/sec   RIGHT POPLITEAL DIST EDV 0 cm/sec  Basic Metabolic Panel (BMET)     Status: Abnormal   Collection Time: 08/08/16  5:37 PM  Result Value Ref Range   Sodium 138 135 - 145 mmol/L   Potassium 3.6 3.5 - 5.1 mmol/L   Chloride 102 101 - 111 mmol/L   CO2 28 22 - 32 mmol/L   Glucose, Bld 209 (H) 65 - 99 mg/dL   BUN 20 6 - 20 mg/dL   Creatinine, Ser 1.17 (H) 0.44 - 1.00 mg/dL   Calcium 9.2 8.9 - 10.3 mg/dL   GFR calc non Af Amer 42 (L) >60 mL/min   GFR calc Af Amer 49 (L) >60 mL/min    Comment: (NOTE) The eGFR has been calculated using the CKD EPI equation. This calculation has not been validated in all clinical situations. eGFR's persistently <60 mL/min signify possible Chronic Kidney Disease.    Anion gap 8 5 - 15  CBC     Status: Abnormal   Collection Time: 08/08/16  5:37 PM  Result Value Ref Range   WBC 11.2 (H) 3.6 - 11.0 K/uL   RBC 4.60 3.80 - 5.20 MIL/uL   Hemoglobin 12.7 12.0 - 16.0 g/dL   HCT 38.2 35.0 - 47.0 %   MCV 83.1 80.0 - 100.0 fL   MCH 27.7 26.0 - 34.0 pg   MCHC 33.3 32.0 - 36.0 g/dL   RDW 18.1 (H) 11.5 - 14.5 %   Platelets 238 150 - 440 K/uL  INR/PT     Status: None   Collection Time: 08/08/16  5:37 PM  Result Value Ref Range   Prothrombin Time 13.7 11.4 - 15.2 seconds   INR 1.05   Glucose,  capillary     Status: Abnormal   Collection Time: 08/16/16  7:00 AM  Result Value Ref Range   Glucose-Capillary 204 (H) 65 - 99 mg/dL  Glucose, capillary     Status: Abnormal   Collection  Time: 08/16/16 11:25 AM  Result Value Ref Range   Glucose-Capillary 202 (H) 65 - 99 mg/dL  VAS US CAROTID     Status: None (In process)   Collection Time: 08/29/16 10:05 AM  Result Value Ref Range   Right CCA prox sys 75 cm/s   Right CCA prox dias 7 cm/s   Right cca dist sys -74 cm/s   Left CCA prox sys 78 cm/s   Left CCA prox dias 0 cm/s   Left CCA dist sys -97 cm/s   Left CCA dist dias -7 cm/s   Left ICA prox sys 96 cm/s   Left ICA prox dias 7 cm/s   Left ICA dist sys -77 cm/s   Left ICA dist dias -10 cm/s   RIGHT CCA MID DIAS -9.00 cm/s   RIGHT ECA DIAS 0.00 cm/s   RIGHT VERTEBRAL DIAS 7.00 cm/s   LEFT ECA DIAS 0.00 cm/s   LEFT VERTEBRAL DIAS 7.00 cm/s    Assessment/Plan:  Essential (primary) hypertension blood pressure control important in reducing the progression of atherosclerotic disease. On appropriate oral medications.   Carotid artery stenosis The patient has mild carotid artery stenosis in the 1-39% range bilaterally. This is not significant weight change or progressed since her last study.  At this point, we can stretch this to an every other year basis.  Type 2 diabetes mellitus with hyperglycemia blood glucose control important in reducing the progression of atherosclerotic disease. Also, involved in wound healing. On appropriate medications.   HLD (hyperlipidemia) lipid control important in reducing the progression of atherosclerotic disease. Continue statin therapy   Atherosclerosis of native arteries of the extremities with ulceration ([place]) The patient has a critical and limb threatening situation. She is markedly reduced perfusion based off the noninvasive studies performed at the cardiologist's office. Her right lower extremity appears more significant than the left  and she has nonhealing wounds. It is not entirely clear to me why some revascularization was not performed on the right leg. At this point, I would generally recommend that. I would be happy to perform another angiogram as I think the likelihood of endovascular revascularization would be high. If that is not technically feasible, consideration for bypass would need to be made. She may also need a left lower extremity angiogram as she also has a wound on this side as well. I discussed the risks and benefits the procedure. The patient is unsure if she wants to have this performed, but I strongly recommended going forward. I discussed without revascularization, clearly the risk of limb loss is significantly increased.      Leotis Pain 08/31/2016, 1:08 PM   This note was created with Dragon medical transcription system.  Any errors from dictation are unintentional.

## 2016-08-31 NOTE — Assessment & Plan Note (Signed)
lipid control important in reducing the progression of atherosclerotic disease. Continue statin therapy  

## 2016-08-31 NOTE — Patient Instructions (Signed)

## 2016-08-31 NOTE — Assessment & Plan Note (Signed)
blood glucose control important in reducing the progression of atherosclerotic disease. Also, involved in wound healing. On appropriate medications.  

## 2016-08-31 NOTE — Assessment & Plan Note (Signed)
The patient has a critical and limb threatening situation. She is markedly reduced perfusion based off the noninvasive studies performed at the cardiologist's office. Her right lower extremity appears more significant than the left and she has nonhealing wounds. It is not entirely clear to me why some revascularization was not performed on the right leg. At this point, I would generally recommend that. I would be happy to perform another angiogram as I think the likelihood of endovascular revascularization would be high. If that is not technically feasible, consideration for bypass would need to be made. She may also need a left lower extremity angiogram as she also has a wound on this side as well. I discussed the risks and benefits the procedure. The patient is unsure if she wants to have this performed, but I strongly recommended going forward. I discussed without revascularization, clearly the risk of limb loss is significantly increased.

## 2016-08-31 NOTE — Assessment & Plan Note (Signed)
The patient has mild carotid artery stenosis in the 1-39% range bilaterally. This is not significant weight change or progressed since her last study.  At this point, we can stretch this to an every other year basis.

## 2016-09-01 ENCOUNTER — Ambulatory Visit: Payer: Self-pay | Admitting: Pharmacist

## 2016-09-01 ENCOUNTER — Other Ambulatory Visit: Payer: Self-pay | Admitting: Pharmacist

## 2016-09-01 DIAGNOSIS — E1151 Type 2 diabetes mellitus with diabetic peripheral angiopathy without gangrene: Secondary | ICD-10-CM | POA: Diagnosis not present

## 2016-09-01 DIAGNOSIS — S91114D Laceration without foreign body of right lesser toe(s) without damage to nail, subsequent encounter: Secondary | ICD-10-CM | POA: Diagnosis not present

## 2016-09-01 DIAGNOSIS — I69398 Other sequelae of cerebral infarction: Secondary | ICD-10-CM | POA: Diagnosis not present

## 2016-09-01 DIAGNOSIS — I110 Hypertensive heart disease with heart failure: Secondary | ICD-10-CM | POA: Diagnosis not present

## 2016-09-01 DIAGNOSIS — M6281 Muscle weakness (generalized): Secondary | ICD-10-CM | POA: Diagnosis not present

## 2016-09-01 DIAGNOSIS — L89612 Pressure ulcer of right heel, stage 2: Secondary | ICD-10-CM | POA: Diagnosis not present

## 2016-09-01 DIAGNOSIS — N183 Chronic kidney disease, stage 3 (moderate): Secondary | ICD-10-CM | POA: Diagnosis not present

## 2016-09-01 DIAGNOSIS — I5042 Chronic combined systolic (congestive) and diastolic (congestive) heart failure: Secondary | ICD-10-CM | POA: Diagnosis not present

## 2016-09-01 DIAGNOSIS — E1122 Type 2 diabetes mellitus with diabetic chronic kidney disease: Secondary | ICD-10-CM | POA: Diagnosis not present

## 2016-09-01 NOTE — Patient Outreach (Signed)
[facility]) Care Management  09/01/2016  [name] [name] June 04, 1931 [id number]    Successful telephone phone-call to patient. HIPAA identifiers verified.   Patient has met TROOP for [employer] PAP. Per [place], patient has spent $1,286.59.  [employer] will include a print out of this TROOP to patient with next scheduled bubble-pack delivery next week.   Patient aware she needs to send this back to [facility] with PAP application   Patient states she is doing better with pushing out her medications onto a towel and thinks she has not missed any doses in the last 2 weeks.  She is using a plastic soda bottle to dispose of her needles.  Patient recently picked up a new prescription of both her novolog and novolog mix pens.   Patient states her daughter, [name] [name], can assist her with her PAP application.   Plan: Doreene Burke, Pharmacy technician, will coordinate PAP application process including: -Mail application to patient. -Contact patient's daughter so she is aware to help her mother fill it out correctly and send back TROOP form with application -Follow-up with patient in 2 weeks or sooner if application returned prior.   Ralene Bathe, PharmD, [place] [phone]

## 2016-09-04 ENCOUNTER — Encounter: Payer: Self-pay | Admitting: Pharmacist

## 2016-09-05 ENCOUNTER — Encounter: Payer: Medicare HMO | Admitting: Internal Medicine

## 2016-09-05 DIAGNOSIS — L89612 Pressure ulcer of right heel, stage 2: Secondary | ICD-10-CM | POA: Diagnosis not present

## 2016-09-05 DIAGNOSIS — I872 Venous insufficiency (chronic) (peripheral): Secondary | ICD-10-CM | POA: Diagnosis not present

## 2016-09-05 DIAGNOSIS — Z794 Long term (current) use of insulin: Secondary | ICD-10-CM | POA: Diagnosis not present

## 2016-09-05 DIAGNOSIS — L97411 Non-pressure chronic ulcer of right heel and midfoot limited to breakdown of skin: Secondary | ICD-10-CM | POA: Diagnosis not present

## 2016-09-05 DIAGNOSIS — M1990 Unspecified osteoarthritis, unspecified site: Secondary | ICD-10-CM | POA: Diagnosis not present

## 2016-09-05 DIAGNOSIS — J449 Chronic obstructive pulmonary disease, unspecified: Secondary | ICD-10-CM | POA: Diagnosis not present

## 2016-09-05 DIAGNOSIS — E1142 Type 2 diabetes mellitus with diabetic polyneuropathy: Secondary | ICD-10-CM | POA: Diagnosis not present

## 2016-09-05 DIAGNOSIS — I5042 Chronic combined systolic (congestive) and diastolic (congestive) heart failure: Secondary | ICD-10-CM | POA: Diagnosis not present

## 2016-09-05 DIAGNOSIS — S91301A Unspecified open wound, right foot, initial encounter: Secondary | ICD-10-CM | POA: Diagnosis not present

## 2016-09-05 DIAGNOSIS — N183 Chronic kidney disease, stage 3 (moderate): Secondary | ICD-10-CM | POA: Diagnosis not present

## 2016-09-05 DIAGNOSIS — E1151 Type 2 diabetes mellitus with diabetic peripheral angiopathy without gangrene: Secondary | ICD-10-CM | POA: Diagnosis not present

## 2016-09-05 DIAGNOSIS — R6889 Other general symptoms and signs: Secondary | ICD-10-CM | POA: Diagnosis not present

## 2016-09-05 DIAGNOSIS — I2510 Atherosclerotic heart disease of native coronary artery without angina pectoris: Secondary | ICD-10-CM | POA: Diagnosis not present

## 2016-09-05 DIAGNOSIS — E11621 Type 2 diabetes mellitus with foot ulcer: Secondary | ICD-10-CM | POA: Diagnosis not present

## 2016-09-05 DIAGNOSIS — E1122 Type 2 diabetes mellitus with diabetic chronic kidney disease: Secondary | ICD-10-CM | POA: Diagnosis not present

## 2016-09-05 DIAGNOSIS — I69398 Other sequelae of cerebral infarction: Secondary | ICD-10-CM | POA: Diagnosis not present

## 2016-09-05 DIAGNOSIS — S91114D Laceration without foreign body of right lesser toe(s) without damage to nail, subsequent encounter: Secondary | ICD-10-CM | POA: Diagnosis not present

## 2016-09-05 DIAGNOSIS — I110 Hypertensive heart disease with heart failure: Secondary | ICD-10-CM | POA: Diagnosis not present

## 2016-09-05 DIAGNOSIS — M6281 Muscle weakness (generalized): Secondary | ICD-10-CM | POA: Diagnosis not present

## 2016-09-05 DIAGNOSIS — S91002D Unspecified open wound, left ankle, subsequent encounter: Secondary | ICD-10-CM | POA: Diagnosis not present

## 2016-09-06 ENCOUNTER — Other Ambulatory Visit: Payer: Self-pay | Admitting: *Deleted

## 2016-09-06 ENCOUNTER — Ambulatory Visit: Payer: Medicare HMO | Admitting: Nurse Practitioner

## 2016-09-06 DIAGNOSIS — I69398 Other sequelae of cerebral infarction: Secondary | ICD-10-CM | POA: Diagnosis not present

## 2016-09-06 DIAGNOSIS — N189 Chronic kidney disease, unspecified: Secondary | ICD-10-CM | POA: Diagnosis not present

## 2016-09-06 DIAGNOSIS — I129 Hypertensive chronic kidney disease with stage 1 through stage 4 chronic kidney disease, or unspecified chronic kidney disease: Secondary | ICD-10-CM | POA: Diagnosis not present

## 2016-09-06 DIAGNOSIS — I639 Cerebral infarction, unspecified: Secondary | ICD-10-CM | POA: Diagnosis not present

## 2016-09-06 DIAGNOSIS — R531 Weakness: Secondary | ICD-10-CM | POA: Diagnosis not present

## 2016-09-06 DIAGNOSIS — I739 Peripheral vascular disease, unspecified: Secondary | ICD-10-CM | POA: Diagnosis not present

## 2016-09-06 DIAGNOSIS — R140 Abdominal distension (gaseous): Secondary | ICD-10-CM | POA: Diagnosis not present

## 2016-09-06 DIAGNOSIS — E1122 Type 2 diabetes mellitus with diabetic chronic kidney disease: Secondary | ICD-10-CM | POA: Diagnosis not present

## 2016-09-06 DIAGNOSIS — M6281 Muscle weakness (generalized): Secondary | ICD-10-CM | POA: Diagnosis not present

## 2016-09-06 DIAGNOSIS — N939 Abnormal uterine and vaginal bleeding, unspecified: Secondary | ICD-10-CM | POA: Diagnosis not present

## 2016-09-06 DIAGNOSIS — C541 Malignant neoplasm of endometrium: Secondary | ICD-10-CM | POA: Diagnosis not present

## 2016-09-06 NOTE — Patient Outreach (Signed)
[facility]) Care Management  09/06/2016  [name] [name] January 07, 1932 [id number]  Received in basket message regarding patient call to 24 hour  nurse call center, with patient reporting vaginal bleeding and increased weakness, read report.  Placed call to patient home , no answer able to leave a hipaa compliant message requesting a return call.  Placed call to patient daughter [name] [name], no answer able to leave a message on home phone, requesting return call, attempted to call cell number x 2 call failed.  [place] [address] call to patient other daughter [name] [name] listed on consent. Discussed message received from nurse call line, and recommendations received regarding going to emergency room, daughter discussed she was at wound clinic appointment with patient on yesterday and noted patient having bleeding noted in commode. Daughter discussed patient needs sooner appointment with GYN due to diagnosis of cervical cancer, has scheduled appointment later part of August.   Daughter will attempt to call to patient home or go to patient home or send her brother home ( he lives closet to patient )  and follow up on  recommendations of patient seeking medical attention at emergency room as recommended by nurse advice line.   Plan Will follow up with patient in the next day. Family will seek emergency follow up as recommended for patient if/as needed.  [name], RN, [place] Management Coordinator  [phone]- Mobile [phone]- Toll Free Main Office

## 2016-09-06 NOTE — Progress Notes (Signed)
[name] [name], [name] [name] ([id number]) Visit Report for 09/05/2016 HPI Details [name] [name], [name] [name] Date of Service: 09/05/2016 1:30 PM [name] Patient Account Number: [id number] Medical Record Treating RN: Ahmed Prima [id number] Number: Other Clinician: Date of Birth/Sex: March 05, 1931 (83 y.o. Female) Treating ROBSON, MICHAEL Primary Care Provider: LAM, LYNN Provider/Extender: G Referring Provider: Waynette Buttery in Treatment: 6 History of Present Illness HPI Description: 07/19/16; this is an 83 year old woman who tells me that she has had a wound on her right heel for about a month and a half. She said she was hospitalized with postmenopausal vaginal bleeding and was hospitalized ultimately at [facility] for this. I do not have these records. Afterward she was sent to [place] for rehabilitation. She states there she developed a wound on her foot. The patient is a type II diabetic on insulin, I'm not really sure of her diabetic control. She has well care or home health we think they are putting Silvadene cream on this. ABIs in our clinic were 0.6 bilaterally. The patient and her husband it was somewhat vague history of possible stent placement in the right thigh 2-3 years ago although I don't see anything about this and [place] link. We will need to research this more before we see her again next week. She did have a normal x-ray of the heel at the end of May I believe when she was seen in the ER. There was no evidence of osteomyelitis although soft tissue edema was noted. Looking through care everywhere and [place] link I can see no reference to vascular surgery, noninvasive vascular tests. It is possible that this was non-at the local vein and vascular Center before they became part of the network. This would be Dr. Lucky Cowboy and Dr. Delana Meyer we will check with their office 07/26/16; the patient's arterial studies were really not very good she has an ABI in the right of 0.52  and a TBI on the right of 0.34. Corresponding values on the left for better at 0.83 on the left and a TBI of 0.54. She was felt to have right mid SFA occlusion and a 50-74% long segment stenosis and diffuse 30-49% left femoral popliteal disease with an occluded left tibial peroneal trunk. In spite of this her wound on her right heel appears to be a lot better today. Smaller with a healthy granulated surface. I have research notes in [place]. The patient is indeed followed by Dr. Gwenlyn Found of cardiology for coronary artery disease status post coronary artery bypass grafting about 7 years ago. He also notes a history of PAD and procedures by Dr. dew in [place]. In spite of this the patient's wound is better today. She is nonambulatory does not complain of pain or claudication and really I don't think given everything that she requires an arteriogram or an invasive arterial evaluation. 08/01/16; in spite of her likely significant PAD the patient's wound continues to contract. As long as this is the case I am going to forego additional arterial consultation. We have been using silver alginate. She is down 0.4 cm in length today 08/08/16; patient has an appointment with Dr. Fletcher Anon today in follow-up for her PAD. Vascular studies that we did last month I think are the cause of this. She has a new wound on her lateral right fifth toe and an unruptured blister on the medial left heel. Her major wound on the right heel is at 1.5 x 1.5 x 0.1 [name] [name] ([id number]) 09/05/16; the patient  was last here she had vascular studies done by Dr. Fletcher Anon. This showed a ABI on the right at 0.5 to and on the left at 0.83. TBI is on the right at 0.34 on the left at 0.54. She has been subsequently seen by Dr. dew who is somewhat annoyed that we have sent her to cardiology for her studies. Nevertheless he felt that it necessary to proceed with an arteriogram for possible salvage. She has 2 remaining wounds  one on the right heel one on the medial aspect of the right fifth toe which was new the last visit. She had an area over the left lateral malleolus which is resolved. Electronic Signature(s) Signed: 09/05/2016 6:05:23 PM By: Linton Ham MD Entered By: Linton Ham on 09/05/2016 17:41:20 [name] [name], [name] [name] ([id number]) -------------------------------------------------------------------------------- Physical Exam Details [name] [name], [name] [name] Date of Service: 09/05/2016 1:30 PM [name] Patient Account Number: [id number] Medical Record Treating RN: Ahmed Prima [id number] Number: Other Clinician: Date of Birth/Sex: Jul 14, 1931 (83 y.o. Female) Treating ROBSON, MICHAEL Primary Care Provider: LAM, LYNN Provider/Extender: G Referring Provider: LAM, LYNN Weeks in Treatment: 6 Constitutional Patient is hypertensive.. Pulse regular and within target range for patient.[name] Respirations regular, non-labored and within target range.. Temperature is normal and within the target range for the patient.[name] appears in no distress. Somewhat frail. Respiratory Respiratory effort is easy and symmetric bilaterally. Rate is normal at rest and on room air.. Cardiovascular Pedal pulses absent bilaterally.. Lymphatic Nonpalpable the right popliteal or inguinal area. Psychiatric No evidence of depression, anxiety, or agitation. Calm, cooperative, and communicative. Appropriate interactions and affect.. Notes Wound exam oThe patient's wound is on the tip of her right heel. Appears healthy and smaller than the last time oShe has a difficult wound on the medial aspect of the right fifth toe and the fourth fifth web space. This is open but nonpainful. oI did not see an open wound on the left leg Electronic Signature(s) Signed: 09/05/2016 6:05:23 PM By: Linton Ham MD Entered By: Linton Ham on 09/05/2016 17:42:40 [name] [name], [name] [name]  ([id number]) -------------------------------------------------------------------------------- Physician Orders Details [name] [name], [name] [name] Date of Service: 09/05/2016 1:30 PM [name] Patient Account Number: [id number] Medical Record Treating RN: Ahmed Prima [id number] Number: Other Clinician: Date of Birth/Sex: 1931-08-17 (83 y.o. Female) Treating ROBSON, MICHAEL Primary Care Provider: LAM, LYNN Provider/Extender: G Referring Provider: LAM, LYNN Weeks in Treatment: 6 Verbal / Phone Orders: Yes Clinician: Pinkerton, Debi Read Back and Verified: Yes Diagnosis Coding Wound Cleansing Wound #1 Right Calcaneus o Cleanse wound with mild soap and water o May Shower, gently pat wound dry prior to applying new dressing. Wound #3 Right,Medial Toe Fifth o Cleanse wound with mild soap and water o May Shower, gently pat wound dry prior to applying new dressing. Anesthetic Wound #1 Right Calcaneus o Topical Lidocaine 4% cream applied to wound bed prior to debridement - in clinic Wound #3 Right,Medial Toe Fifth o Topical Lidocaine 4% cream applied to wound bed prior to debridement - in clinic Primary Wound Dressing Wound #1 Right Calcaneus o Aquacel Ag Wound #3 Right,Medial Toe Fifth o Aquacel Ag Secondary Dressing Wound #1 Right Calcaneus o Gauze and Kerlix/Conform o Other - heel pads Wound #3 Right,Medial Toe Fifth o Other - coverlet band-aide Dressing Change Frequency Wound #1 Right Calcaneus o Change Dressing Monday, Wednesday, Friday - she comes to the wound clinic wednesdays LATANYA, HEMMER ([id number]) Follow-up Appointments Wound #1 Right Calcaneus o Return Appointment in 1 week. Edema Control Wound #1 Right Calcaneus o Elevate legs  to the level of the heart and pump ankles as often as possible Off-Loading Wound #1 Right Calcaneus o Other: - elevated legs on pillows Additional Orders / Instructions Wound #1 Right  Calcaneus o Increase protein intake. Home Health Wound #1 Right [place] Visits - [place] Nurse may visit PRN to address patientos wound care needs. o FACE TO FACE ENCOUNTER: MEDICARE and MEDICAID PATIENTS: I certify that this patient is under my care and that I had a face-to-face encounter that meets the physician face-to-face encounter requirements with this patient on this date. The encounter with the patient was in whole or in part for the following MEDICAL CONDITION: (primary reason for [place]) MEDICAL NECESSITY: I certify, that based on my findings, NURSING services are a medically necessary home health service. HOME BOUND STATUS: I certify that my clinical findings support that this patient is homebound (i.e., Due to illness or injury, pt requires aid of supportive devices such as crutches, cane, wheelchairs, walkers, the use of special transportation or the assistance of another person to leave their place of residence. There is a normal inability to leave the home and doing so requires considerable and taxing effort. Other absences are for medical reasons / religious services and are infrequent or of short duration when for other reasons). o If current dressing causes regression in wound condition, may D/C ordered dressing product/s and apply Normal Saline Moist Dressing daily until next [place] / Other MD appointment. [place] of regression in wound condition at [phone]. o Please direct any NON-WOUND related issues/requests for orders to patient's Primary Care Physician Electronic Signature(s) Signed: 09/05/2016 5:31:33 PM By: Alric Quan Signed: 09/05/2016 6:05:23 PM By: Linton Ham MD Entered By: Alric Quan on 09/05/2016 14:23:51 [name] [name], [name] [name] ([id number]) -------------------------------------------------------------------------------- Problem List  Details [name] [name], [name] [name] Date of Service: 09/05/2016 1:30 PM [name] Patient Account Number: [id number] Medical Record Treating RN: Ahmed Prima [id number] Number: Other Clinician: Date of Birth/Sex: 06-23-1931 (83 y.o. 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 Patient Account Number: [id number] Medical Record Treating RN: Ahmed Prima [id number] Number: Other Clinician: Date of Birth/Sex: Jul 03, 1931 (83 y.o. Female) Treating ROBSON, MICHAEL Primary Care Provider: LAM, LYNN Provider/Extender: G Referring Provider: LAM, LYNN Weeks in Treatment: 6 Subjective History of Present Illness (HPI) 07/19/16; this is an 83 year old woman who tells me that she has had a wound on her right heel for about a month and a half. She said she was hospitalized with postmenopausal vaginal bleeding and was hospitalized ultimately at [facility] for this. I do not have these records. Afterward she was sent to [place] for rehabilitation. She states there she developed a wound on her foot. The patient is a  type II diabetic on insulin, I'm not really sure of her diabetic control. She has well care  or home health we think they are putting Silvadene cream on this. ABIs in our clinic were 0.6 bilaterally. The patient and her husband it was somewhat vague history of possible stent placement in the right thigh 2-3 years ago although I don't see anything about this and [place] link. We will need to research this more before we see her again next week. She did have a normal x-ray of the heel at the end of May I believe when she was seen in the ER. There was no evidence of osteomyelitis although soft tissue edema was noted. Looking through care everywhere and [place] link I can see no reference to vascular surgery, noninvasive vascular tests. It is possible that this was non-at the local vein and vascular Center before they became part of the network. This would be Dr. Lucky Cowboy and Dr. Delana Meyer we will check with their office 07/26/16; the patient's arterial studies were really not very good she has an ABI in the right of 0.52 and a TBI on the right of 0.34. Corresponding values on the left for better at 0.83 on the left and a TBI of 0.54. She was felt to have right mid SFA occlusion and a 50-74% long segment stenosis and diffuse 30-49% left femoral popliteal disease with an occluded left tibial peroneal trunk. In spite of this her wound on her right heel appears to be a lot better today. Smaller with a healthy granulated surface. I have research notes in [place]. The patient is indeed followed by Dr. Gwenlyn Found of cardiology for coronary artery disease status post coronary artery bypass grafting about 7 years ago. He also notes a history of PAD and procedures by Dr. dew in [place]. In spite of this the patient's wound is better today. She is nonambulatory does not complain of pain or claudication and really I don't think given everything that she requires an arteriogram or an invasive arterial  evaluation. 08/01/16; in spite of her likely significant PAD the patient's wound continues to contract. As long as this is the case I am going to forego additional arterial consultation. We have been using silver alginate. She is down 0.4 cm in length today 08/08/16; patient has an appointment with Dr. Fletcher Anon today in follow-up for her PAD. Vascular studies that we did last month I think are the cause of this. She has a new wound on her lateral right fifth toe and an unruptured blister on the medial left heel. Her major wound on the right heel is at 1.5 x 1.5 x 0.1 09/05/16; the patient was last here she had vascular studies done by Dr. Fletcher Anon. This showed a ABI on the right at 0.5 to and on the left at 0.83. TBI is on the right at 0.34 on the left at 0.54. She has been [name] [name], [name] ([id number]) subsequently seen by Dr. dew who is somewhat annoyed that we have sent her to cardiology for her studies. Nevertheless he felt that it necessary to proceed with an arteriogram for possible salvage. She has 2 remaining wounds one on the right heel one on the medial aspect of the right fifth toe which was new the last visit. She had an area over the left lateral malleolus which is resolved. Objective Constitutional Patient is hypertensive.. Pulse regular and within target range for patient.[name] Respirations regular, non-labored and within target range.. Temperature is normal and within the target range for the patient.[name] appears in no distress. Somewhat frail. Vitals Time Taken: 1:40 PM,  Height: 63 in, Weight: 203 lbs, BMI: 36, Temperature: 98.1 F, Pulse: 62 bpm, Respiratory Rate: 16 breaths/min, Blood Pressure: 153/59 mmHg. Respiratory Respiratory effort is easy and symmetric bilaterally. Rate is normal at rest and on room air.. Cardiovascular Pedal pulses absent bilaterally.. Lymphatic Nonpalpable the right popliteal or inguinal area. Psychiatric No evidence of depression, anxiety, or  agitation. Calm, cooperative, and communicative. Appropriate interactions and affect.. General Notes: Wound exam The patient's wound is on the tip of her right heel. Appears healthy and smaller than the last time She has a difficult wound on the medial aspect of the right fifth toe and the fourth fifth web space. This is open but nonpainful. I did not see an open wound on the left leg Integumentary (Hair, Skin) Wound #1 status is Open. Original cause of wound was Gradually Appeared. The wound is located on the Right Calcaneus. The wound measures 0.7cm length x 0.7cm width x 0.1cm depth; 0.385cm^2 area and 0.038cm^3 volume. There is Fat Layer (Subcutaneous Tissue) Exposed exposed. There is no tunneling or undermining noted. There is a large amount of serous drainage noted. The wound margin is distinct with the outline attached to the wound base. There is large (67-100%) pale granulation within the wound bed. There is a small (1-33%) amount of necrotic tissue within the wound bed including Adherent Slough. The periwound skin appearance did not exhibit: Callus, Crepitus, Excoriation, Induration, Rash, Scarring, [name] [name], Addison C. ([id number]) Dry/Scaly, Maceration, Atrophie Blanche, Cyanosis, Ecchymosis, Hemosiderin Staining, Mottled, Pallor, Rubor, Erythema. Periwound temperature was noted as No Abnormality. Wound #2 status is Healed - Epithelialized. Original cause of wound was Blister. The wound is located on the Left,Medial Malleolus. The wound measures 0cm length x 0cm width x 0cm depth; 0cm^2 area and 0cm^3 volume. There is no tunneling or undermining noted. There is a none present amount of drainage noted. The wound margin is flat and intact. There is no granulation within the wound bed. There is no necrotic tissue within the wound bed. Wound #3 status is Open. Original cause of wound was Pressure Injury. The wound is located on the Right,Medial Toe Fifth. The wound measures 0.3cm  length x 0.4cm width x 0.3cm depth; 0.094cm^2 area and 0.028cm^3 volume. There is Fat Layer (Subcutaneous Tissue) Exposed exposed. There is no tunneling or undermining noted. There is a large amount of serous drainage noted. The wound margin is flat and intact. There is no granulation within the wound bed. There is a large (67-100%) amount of necrotic tissue within the wound bed including Adherent Slough. The periwound skin appearance exhibited: Maceration. Periwound temperature was noted as No Abnormality. The periwound has tenderness on palpation. Assessment Active Problems ICD-10 E11.621 - Type 2 diabetes mellitus with foot ulcer L97.411 - Non-pressure chronic ulcer of right heel and midfoot limited to breakdown of skin E11.51 - Type 2 diabetes mellitus with diabetic peripheral angiopathy without gangrene E11.42 - Type 2 diabetes mellitus with diabetic polyneuropathy Plan Wound Cleansing: Wound #1 Right Calcaneus: Cleanse wound with mild soap and water May Shower, gently pat wound dry prior to applying new dressing. Wound #3 Right,Medial Toe Fifth: Cleanse wound with mild soap and water May Shower, gently pat wound dry prior to applying new dressing. Anesthetic: Wound #1 Right Calcaneus: Topical Lidocaine 4% cream applied to wound bed prior to debridement - in clinic Wound #3 Right,Medial Toe Fifth: Topical Lidocaine 4% cream applied to wound bed prior to debridement - in clinic Primary Wound Dressing: [name] [name], [name] [name] ([id number]) Wound #1 Right  Calcaneus: Aquacel Ag Wound #3 Right,Medial Toe Fifth: Aquacel Ag Secondary Dressing: Wound #1 Right Calcaneus: Gauze and Kerlix/Conform Other - heel pads Wound #3 Right,Medial Toe Fifth: Other - coverlet band-aide Dressing Change Frequency: Wound #1 Right Calcaneus: Change Dressing Monday, Wednesday, Friday - she comes to the wound clinic wednesdays Follow-up Appointments: Wound #1 Right Calcaneus: Return Appointment in  1 week. Edema Control: Wound #1 Right Calcaneus: Elevate legs to the level of the heart and pump ankles as often as possible Off-Loading: Wound #1 Right Calcaneus: Other: - elevated legs on pillows Additional Orders / Instructions: Wound #1 Right Calcaneus: Increase protein intake. Home Health: Wound #1 Right Calcaneus: Continue Home Health Visits - [place] Nurse may visit PRN to address patient s wound care needs. FACE TO FACE ENCOUNTER: MEDICARE and MEDICAID PATIENTS: I certify that this patient is under my care and that I had a face-to-face encounter that meets the physician face-to-face encounter requirements with this patient on this date. The encounter with the patient was in whole or in part for the following MEDICAL CONDITION: (primary reason for [place]) MEDICAL NECESSITY: I certify, that based on my findings, NURSING services are a medically necessary home health service. HOME BOUND STATUS: I certify that my clinical findings support that this patient is homebound (i.e., Due to illness or injury, pt requires aid of supportive devices such as crutches, cane, wheelchairs, walkers, the use of special transportation or the assistance of another person to leave their place of residence. There is a normal inability to leave the home and doing so requires considerable and taxing effort. Other absences are for medical reasons / religious services and are infrequent or of short duration when for other reasons). If current dressing causes regression in wound condition, may D/C ordered dressing product/s and apply Normal Saline Moist Dressing daily until next [place] / Other MD appointment. [place] of regression in wound condition at [phone]. Please direct any NON-WOUND related issues/requests for orders to patient's Primary Care Physician #1 we continued with Aquacel Ag to both wound area on the right calcaneus and the right  fifth toe. [name] [name], LORICE LAFAVE ([id number]) #2 I had a long discussion with the patient in the presence of her family about the advisability of proceeding with revascularization. 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 In spite of this I think it is reasonable to go ahead and reattempt angiography on the right side to see if there is anything that can be done to improve the blood flow on the right foot. I told her of this ultimately is going to be her decision. #3 follow-up in 2 weeks Electronic Signature(s) Signed: 09/05/2016 6:05:23 PM By: Linton Ham MD Entered By: Linton Ham on 09/05/2016 17:50:28 [name] [name], [name] [name] ([id number]) -------------------------------------------------------------------------------- SuperBill Details [name] [name], [place] Date of Service: 09/05/2016 [name] Patient Account Number: [id number] Medical Record Treating RN: Ahmed Prima [id number] Number: Other Clinician: Date of Birth/Sex: 06-Jul-1931 (83 y.o. 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 E11.51 Type 2 diabetes mellitus with diabetic peripheral angiopathy without gangrene E11.42 Type 2 diabetes mellitus with diabetic polyneuropathy Facility Procedures CPT4 Code: [id number] Description: 99214 - WOUND CARE VISIT-LEV 4 EST PT Modifier: Quantity: 1  Physician Procedures CPT4: Description Modifier Quantity Code [id number] 66063 - WC PHYS LEVEL 2 - EST PT 1 ICD-10 Description Diagnosis 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

## 2016-09-07 ENCOUNTER — Other Ambulatory Visit: Payer: Self-pay | Admitting: *Deleted

## 2016-09-07 ENCOUNTER — Encounter: Payer: Self-pay | Admitting: *Deleted

## 2016-09-07 DIAGNOSIS — E1151 Type 2 diabetes mellitus with diabetic peripheral angiopathy without gangrene: Secondary | ICD-10-CM | POA: Diagnosis not present

## 2016-09-07 DIAGNOSIS — Z6835 Body mass index (BMI) 35.0-35.9, adult: Secondary | ICD-10-CM | POA: Diagnosis not present

## 2016-09-07 DIAGNOSIS — L89612 Pressure ulcer of right heel, stage 2: Secondary | ICD-10-CM | POA: Diagnosis not present

## 2016-09-07 DIAGNOSIS — R269 Unspecified abnormalities of gait and mobility: Secondary | ICD-10-CM | POA: Diagnosis not present

## 2016-09-07 DIAGNOSIS — S91114D Laceration without foreign body of right lesser toe(s) without damage to nail, subsequent encounter: Secondary | ICD-10-CM | POA: Diagnosis not present

## 2016-09-07 DIAGNOSIS — I110 Hypertensive heart disease with heart failure: Secondary | ICD-10-CM | POA: Diagnosis not present

## 2016-09-07 DIAGNOSIS — R54 Age-related physical debility: Secondary | ICD-10-CM | POA: Diagnosis not present

## 2016-09-07 DIAGNOSIS — C541 Malignant neoplasm of endometrium: Secondary | ICD-10-CM | POA: Diagnosis not present

## 2016-09-07 DIAGNOSIS — E1122 Type 2 diabetes mellitus with diabetic chronic kidney disease: Secondary | ICD-10-CM | POA: Diagnosis not present

## 2016-09-07 DIAGNOSIS — I69398 Other sequelae of cerebral infarction: Secondary | ICD-10-CM | POA: Diagnosis not present

## 2016-09-07 DIAGNOSIS — I5042 Chronic combined systolic (congestive) and diastolic (congestive) heart failure: Secondary | ICD-10-CM | POA: Diagnosis not present

## 2016-09-07 DIAGNOSIS — R531 Weakness: Secondary | ICD-10-CM | POA: Diagnosis not present

## 2016-09-07 DIAGNOSIS — N183 Chronic kidney disease, stage 3 (moderate): Secondary | ICD-10-CM | POA: Diagnosis not present

## 2016-09-07 DIAGNOSIS — M6281 Muscle weakness (generalized): Secondary | ICD-10-CM | POA: Diagnosis not present

## 2016-09-07 NOTE — Progress Notes (Signed)
LEEYA, RUSCONI ([id number]) Visit Report for 09/05/2016 Arrival Information Details [name] [name], [name] [name] Date of Service: 09/05/2016 1:30 PM [name] Patient Account Number: [id number] Medical Record Treating RN: Ahmed Prima [id number] Number: Other Clinician: Date of Birth/Sex: 04/07/1931 (83 y.o. Female) Treating ROBSON, MICHAEL Primary Care [name] [name]: LAM, LYNN [name] [name]/Extender: G Referring [name] [name]: LAM, LYNN Weeks in Treatment: 6 Visit Information History Since Last Visit All ordered tests and consults were completed: No Patient Arrived: Wheel Chair Added or deleted any medications: No Arrival Time: 13:35 Any new allergies or adverse reactions: No Accompanied By: family Had a fall or experienced change in No Transfer Assistance: [employer] activities of daily living that may affect Patient [name] risk of falls: Patient Identification Verified: Yes Signs or symptoms of abuse/neglect since last No Secondary Verification Process Yes visito Completed: Hospitalized since last visit: No Patient Requires Transmission- No Has Dressing in Place as Prescribed: Yes Based Precautions: Pain Present Now: No Patient Has Alerts: No Electronic Signature(s) Signed: 09/05/2016 5:31:33 PM By: Alric Quan Entered By: Alric Quan on 09/05/2016 13:39:02 [name] [name], [name] [name] ([id number]) -------------------------------------------------------------------------------- Clinic Level of Care Assessment Details [name] [name], [name] [name] Date of Service: 09/05/2016 1:30 PM [name] Patient Account Number: [id number] Medical Record Treating RN: Ahmed Prima [id number] Number: Other Clinician: Date of Birth/Sex: 1931/06/18 (83 y.o. Female) Treating ROBSON, [name] [name] Primary Care [name] [name]: LAM, LYNN Kaeden Mester/Extender: G Referring Sherrell Farish: LAM, LYNN Weeks in Treatment: 6 Clinic Level of Care Assessment Items TOOL 4 Quantity Score X - Use when only an EandM is  performed on FOLLOW-UP visit 1 0 ASSESSMENTS - Nursing Assessment / Reassessment X - Reassessment of Co-morbidities (includes updates in patient status) 1 10 X - Reassessment of Adherence to Treatment Plan 1 5 ASSESSMENTS - Wound and Skin Assessment / Reassessment []  - Simple Wound Assessment / Reassessment - one wound 0 X - Complex Wound Assessment / Reassessment - multiple wounds 2 5 []  - Dermatologic / Skin Assessment (not related to wound area) 0 ASSESSMENTS - Focused Assessment []  - Circumferential Edema Measurements - multi extremities 0 []  - Nutritional Assessment / Counseling / Intervention 0 []  - Lower Extremity Assessment (monofilament, tuning fork, pulses) 0 []  - Peripheral Arterial Disease Assessment (using hand held doppler) 0 ASSESSMENTS - Ostomy and/or Continence Assessment and Care []  - Incontinence Assessment and Management 0 []  - Ostomy Care Assessment and Management (repouching, etc.) 0 PROCESS - Coordination of Care []  - Simple Patient / Family Education for ongoing care 0 X - Complex (extensive) Patient / Family Education for ongoing care 1 20 X - Staff obtains Programmer, systems, Records, Test Results / Process Orders 1 10 X - Staff telephones HHA, Nursing Homes / Clarify orders / etc 1 10 [name] [name], [name] ([id number]) []  - Routine Transfer to another Facility (non-emergent condition) 0 []  - Routine Hospital Admission (non-emergent condition) 0 []  - New Admissions / Biomedical engineer / Ordering NPWT, Apligraf, etc. 0 []  - Emergency Hospital Admission (emergent condition) 0 X - Simple Discharge Coordination 1 10 []  - Complex (extensive) Discharge Coordination 0 PROCESS - Special Needs []  - Pediatric / Minor Patient Management 0 []  - Isolation Patient Management 0 []  - Hearing / Language / Visual special needs 0 []  - Assessment of Community assistance (transportation, D/C planning, etc.) 0 []  - Additional assistance / Altered mentation 0 []  - Support  Surface(s) Assessment (bed, cushion, seat, etc.) 0 INTERVENTIONS - Wound Cleansing / Measurement []  - Simple Wound Cleansing - one wound 0 X - Complex Wound Cleansing - multiple wounds  2 5 X - Wound Imaging (photographs - any number of wounds) 1 5 []  - Wound Tracing (instead of photographs) 0 []  - Simple Wound Measurement - one wound 0 X - Complex Wound Measurement - multiple wounds 2 5 INTERVENTIONS - Wound Dressings X - Small Wound Dressing one or multiple wounds 1 10 X - Medium Wound Dressing one or multiple wounds 1 15 []  - Large Wound Dressing one or multiple wounds 0 X - Application of Medications - topical 1 5 []  - Application of Medications - injection 0 [name] [name], [name] ([id number]) INTERVENTIONS - Miscellaneous []  - External ear exam 0 []  - Specimen Collection (cultures, biopsies, blood, body fluids, etc.) 0 []  - Specimen(s) / Culture(s) sent or taken to Lab for analysis 0 []  - Patient Transfer (multiple staff / [name] [name] / Similar devices) 0 []  - Simple Staple / Suture removal (25 or less) 0 []  - Complex Staple / Suture removal (26 or more) 0 []  - Hypo / Hyperglycemic Management (close monitor of Blood Glucose) 0 []  - Ankle / Brachial Index (ABI) - do not check if billed separately 0 X - Vital Signs 1 5 Has the patient been seen at the hospital within the last three years: Yes Total Score: 135 Level Of Care: New/Established - Level 4 Electronic Signature(s) Signed: 09/05/2016 5:31:33 PM By: Alric Quan Entered By: Alric Quan on 09/05/2016 16:04:59 [name] [name], [name] [name] ([id number]) -------------------------------------------------------------------------------- Encounter Discharge Information Details [name] [name], [name] [name] Date of Service: 09/05/2016 1:30 PM [name] Patient Account Number: [id number] Medical Record Treating RN: Ahmed Prima [id number] Number: Other Clinician: Date of Birth/Sex: 1931/11/01 (83 y.o. Female) Treating ROBSON,  MICHAEL Primary Care Rozalia Dino: LAM, LYNN Shacara Cozine/Extender: G Referring Damare Serano: LAM, LYNN Weeks in Treatment: 6 Encounter Discharge Information Items Discharge Pain Level: 0 Discharge Condition: Stable Ambulatory Status: Wheelchair Discharge Destination: Home Transportation: Private Auto Accompanied By: family Schedule Follow-up Appointment: Yes Medication Reconciliation completed and provided to Patient/Care No Deegan Valentino: Provided on Clinical Summary of Care: 09/05/2016 Form Type Recipient Paper Patient JMS Electronic Signature(s) Signed: 09/05/2016 2:37:12 PM By: Ruthine Dose Entered By: Ruthine Dose on 09/05/2016 14:37:12 [name] [name], [name] [name] ([id number]) -------------------------------------------------------------------------------- Lower Extremity Assessment Details [name] [name], [name] [name] Date of Service: 09/05/2016 1:30 PM [name] Patient Account Number: [id number] Medical Record Treating RN: Ahmed Prima [id number] Number: Other Clinician: Date of Birth/Sex: 1932-02-03 (83 y.o. Female) Treating ROBSON, MICHAEL Primary Care Xana Bradt: LAM, LYNN Frady Taddeo/Extender: G Referring Elsy Chiang: LAM, LYNN Weeks in Treatment: 6 Vascular Assessment Pulses: Dorsalis Pedis Palpable: [Right:No] Doppler Audible: [Right:Yes] Posterior Tibial Extremity colors, hair growth, and conditions: Extremity Color: [Right:Normal] Capillary Refill: [Right:> 3 seconds] Toe Nail Assessment Left: Right: Thick: No Discolored: No Deformed: No Improper Length and Hygiene: Yes Electronic Signature(s) Signed: 09/05/2016 5:31:33 PM By: Alric Quan Entered By: Alric Quan on 09/05/2016 14:21:16 [name] [name], [name] [name] ([id number]) -------------------------------------------------------------------------------- Multi Wound Chart Details [name] [name], [name] [name] Date of Service: 09/05/2016 1:30 PM [name] Patient Account Number: [id number] Medical Record Treating RN:  Ahmed Prima [id number] Number: Other Clinician: Date of Birth/Sex: 1931-02-07 (83 y.o. 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 Extremity the Lower  Extremity the Lower Extremity Comorbid History: Cataracts, Arrhythmia, Cataracts, Arrhythmia, Cataracts, Arrhythmia, Coronary Artery Disease, Coronary Artery Disease, Coronary Artery Disease, Hypertension, Type II Hypertension, Type II Hypertension, Type II Diabetes, History of Diabetes, History of Diabetes, History of pressure wounds, pressure wounds, pressure wounds, Osteoarthritis, Neuropathy Osteoarthritis, Neuropathy Osteoarthritis, Neuropathy Date Acquired: 06/13/2016 08/06/2016 07/10/2016 Weeks of Treatment: 6 4 4  Wound Status: Open Healed - Epithelialized Open Measurements L x W x D 0.7x0.7x0.1 0x0x0 0.3x0.4x0.3 (cm) Area (cm) : 0.385 0 0.094 Volume (cm) : 0.038 0 0.028 % Reduction in Area: 89.30% 100.00% 40.10% % Reduction in Volume: 89.50% 100.00% 80.10% Classification: Grade 1 Grade 0 Grade 2 Exudate Amount: Large None Present Large [name] [name], [name] ([id number]) Exudate Type: Serous N/A Serous Exudate Color: amber N/A amber Foul Odor After Yes No No Cleansing: Odor Anticipated Due to No N/A N/A Product Use: Wound Margin: Distinct, outline attached Flat and Intact Flat and Intact Granulation Amount: Large (67-100%) None Present (0%) None Present (0%) Granulation Quality: Pale N/A N/A Necrotic  Amount: Small (1-33%) None Present (0%) Large (67-100%) Exposed Structures: Fat Layer (Subcutaneous N/A Fat Layer (Subcutaneous Tissue) Exposed: Yes Tissue) Exposed: Yes Fascia: No Fascia: No Tendon: No Tendon: No Muscle: No Muscle: No Joint: No Joint: No Bone: No Bone: No Epithelialization: Medium (34-66%) Large (67-100%) None Periwound Skin Texture: Excoriation: No No Abnormalities Noted No Abnormalities Noted Induration: No Callus: No Crepitus: No Rash: No Scarring: No Periwound Skin Maceration: No No Abnormalities Noted Maceration: Yes Moisture: Dry/Scaly: No Periwound Skin Color: Atrophie Blanche: No No Abnormalities Noted No Abnormalities Noted Cyanosis: No Ecchymosis: No Erythema: No Hemosiderin Staining: No Mottled: No Pallor: No Rubor: No Temperature: No Abnormality N/A No Abnormality Tenderness on No No Yes Palpation: Wound Preparation: Ulcer Cleansing: Ulcer Cleansing: Ulcer Cleansing: Rinsed/Irrigated with Rinsed/Irrigated with Rinsed/Irrigated with Saline Saline Saline Topical Anesthetic Topical Anesthetic Topical Anesthetic Applied: Other: lidocaine Applied: None Applied: Other: lidocaine 4% 4% Treatment Notes Wound #1 (Right Calcaneus) 1. Cleansed with: Clean wound with Normal Saline [name] [name], Gwenith C. ([id number]) 2. Anesthetic Topical Lidocaine 4% cream to wound bed prior to debridement 4. Dressing Applied: Aquacel Ag 5. Secondary Dressing Applied Dry Gauze Foam Kerlix/Conform 7. Secured with Tape Notes heel cup Wound #3 (Right, Medial Toe Fifth) 1. Cleansed with: Clean wound with Normal Saline 2. Anesthetic Topical Lidocaine 4% cream to wound bed prior to debridement 4. Dressing Applied: Aquacel Ag Notes coverlet band-aide Electronic Signature(s) Signed: 09/05/2016 6:05:23 PM By: Linton Ham MD Previous Signature: 09/05/2016 5:31:33 PM Version By: Alric Quan Entered By: Linton Ham on 09/05/2016  17:39:03 [name] [name], [name] [name] ([id number]) -------------------------------------------------------------------------------- Multi-Disciplinary Care Plan Details [name] [name], Correne Date of Service: 09/05/2016 1:30 PM [name] Patient Account Number: [id number] Medical Record Treating RN: Ahmed Prima [id number] Number: Other Clinician: Date of Birth/Sex: [birth year] (83 y.o. Female) Treating ROBSON, MICHAEL Primary Care Tunisha Ruland: LAM, LYNN Kamaree Wheatley/Extender: G Referring Aki Abalos: LAM, LYNN Weeks in Treatment: 6 Active Inactive ` Orientation to the Wound Care Program Nursing Diagnoses: Knowledge deficit related to the wound healing center program Goals: Patient/caregiver will verbalize understanding of the [place] Date Initiated: 07/26/2016 Target Resolution Date: 07/31/2016 Goal Status: Active Interventions: Provide education on orientation to the wound center Notes: ` Soft Tissue Infection Nursing Diagnoses: Impaired tissue integrity Goals: Signs and symptoms of infection will be recognized early to allow for prompt treatment Date Initiated: 07/26/2016 Target Resolution Date: 07/31/2016 Goal Status: Active Interventions: Assess signs and symptoms of infection every visit Notes: ` Wound/Skin Impairment Nursing Diagnoses:  [name] [name], LEIGHTYN CINA ([id number]) Impaired tissue integrity Goals: Ulcer/skin breakdown will heal within 14 weeks Date Initiated: 07/26/2016 Target Resolution Date: 10/30/2016 Goal Status: Active Interventions: Assess patient/caregiver ability to perform ulcer/skin care regimen upon admission and as needed Treatment Activities: Skin care regimen initiated : 07/26/2016 Notes: Electronic Signature(s) Signed: 09/05/2016 5:31:33 PM By: Alric Quan Entered By: Alric Quan on 09/05/2016 14:15:17 [name] [name], [name] [name]  ([id number]) -------------------------------------------------------------------------------- Pain Assessment Details [name] [name], Murielle Date of Service: 09/05/2016 1:30 PM [name] Patient Account Number: [id number] Medical Record Treating RN: Ahmed Prima [id number] Number: Other Clinician: Date of Birth/Sex: 09-03-1931 (83 y.o. Female) Treating ROBSON, MICHAEL Primary Care Carmela Piechowski: LAM, LYNN Dakisha Schoof/Extender: G Referring Rowan Pollman: LAM, LYNN Weeks in Treatment: 6 Active Problems Location of Pain Severity and Description of Pain Patient Has Paino No Site Locations With Dressing Change: No Pain Management and Medication Current Pain Management: Electronic Signature(s) Signed: 09/05/2016 5:31:33 PM By: Alric Quan Entered By: Alric Quan on 09/05/2016 13:39:14 [name] [name], [name] [name] ([id number]) -------------------------------------------------------------------------------- Patient/Caregiver Education Details [name] [name], Bryer Date of Service: 09/05/2016 1:30 PM [name] Patient Account Number: [id number] Medical Record Treating RN: Ahmed Prima [id number] Number: Other Clinician: Date of Birth/Gender: Sep 20, 1931 (83 y.o. Female) Treating ROBSON, MICHAEL Primary Care Physician/Extender: Autumn Messing Physician: Weeks in Treatment: 6 Referring Physician: LAM, LYNN Education Assessment Education Provided To: Patient Education Topics Provided Wound/Skin Impairment: Handouts: Other: change dressing as ordered Methods: Demonstration, Explain/Verbal Responses: State content correctly Electronic Signature(s) Signed: 09/05/2016 5:31:33 PM By: Alric Quan Entered By: Alric Quan on 09/05/2016 14:16:08 [name] [name], [name] [name] ([id number]) -------------------------------------------------------------------------------- Wound Assessment Details [name] [name], Twana Date of Service: 09/05/2016 1:30 PM [name] Patient Account  Number: [id number] Medical Record Treating RN: Ahmed Prima [id number] Number: Other Clinician: Date of Birth/Sex: Jul 06, 1931 (83 y.o. Female) Treating ROBSON, MICHAEL Primary Care Koty Anctil: LAM, LYNN Destony Prevost/Extender: G Referring Rosenda Geffrard: LAM, LYNN Weeks in Treatment: 6 Wound Status Wound Number: 1 Primary Diabetic Wound/Ulcer of the Lower Etiology: Extremity Wound Location: Right Calcaneus Wound Open Wounding Event: Gradually Appeared Status: Date Acquired: 06/13/2016 Comorbid Cataracts, Arrhythmia, Coronary Artery Weeks Of Treatment: 6 History: Disease, Hypertension, Type II Clustered Wound: No Diabetes, History of pressure wounds, Osteoarthritis, Neuropathy Photos Photo Uploaded By: Alric Quan on 09/05/2016 17:08:07 Wound Measurements Length: (cm) 0.7 Width: (cm) 0.7 Depth: (cm) 0.1 Area: (cm) 0.385 Volume: (cm) 0.038 % Reduction in Area: 89.3% % Reduction in Volume: 89.5% Epithelialization: Medium (34-66%) Tunneling: No Undermining: No Wound Description Classification: Grade 1 Foul Odor Afte Wound Margin: Distinct, outline attached Due to Product Exudate Amount: Large Slough/Fibrino Exudate Type: Serous Exudate Color: amber r Cleansing: Yes Use: No Yes Wound Bed Granulation Amount: Large (67-100%) Exposed Structure [name] [name], Braydee C. ([id number]) Granulation Quality: Pale Fascia Exposed: No Necrotic Amount: Small (1-33%) Fat Layer (Subcutaneous Tissue) Exposed: Yes Necrotic Quality: Adherent Slough Tendon Exposed: No Muscle Exposed: No Joint Exposed: No Bone Exposed: No Periwound Skin Texture Texture Color No Abnormalities Noted: No No Abnormalities Noted: No Callus: No Atrophie Blanche: No Crepitus: No Cyanosis: No Excoriation: No Ecchymosis: No Induration: No Erythema: No Rash: No Hemosiderin Staining: No Scarring: No Mottled: No Pallor: No Moisture Rubor: No No Abnormalities Noted: No Dry / Scaly: No Temperature /  Pain Maceration: No Temperature: No Abnormality Wound Preparation Ulcer Cleansing: Rinsed/Irrigated with Saline Topical Anesthetic Applied: Other: lidocaine 4%, Treatment Notes Wound #1 (Right Calcaneus) 1. Cleansed with: Clean wound with Normal Saline 2. Anesthetic Topical Lidocaine 4% cream to wound bed prior to debridement 4. Dressing Applied: Aquacel Ag 5. Secondary  Dressing Applied Dry Gauze Foam Kerlix/Conform 7. Secured with Tape Notes heel cup Electronic Signature(s) Signed: 09/05/2016 5:31:33 PM By: Alric Quan Entered By: Alric Quan on 09/05/2016 13:50:26 [name] [name], [name] [name] ([id number]) [name] [name], JANAIYA BEAUCHESNE ([id number]) -------------------------------------------------------------------------------- Wound Assessment Details [name] [name], Lynee Date of Service: 09/05/2016 1:30 PM [name] Patient Account Number: [id number] Medical Record Treating RN: Ahmed Prima [id number] Number: Other Clinician: Date of Birth/Sex: 1931-08-13 (83 y.o. Female) Treating ROBSON, MICHAEL Primary Care Samier Jaco: LAM, LYNN Laylana Gerwig/Extender: G Referring Jash Wahlen: LAM, LYNN Weeks in Treatment: 6 Wound Status Wound Number: 2 Primary Diabetic Wound/Ulcer of the Lower Etiology: Extremity Wound Location: Left Malleolus - Medial Wound Healed - Epithelialized Wounding Event: Blister Status: Date Acquired: 08/06/2016 Comorbid Cataracts, Arrhythmia, Coronary Artery Weeks Of Treatment: 4 History: Disease, Hypertension, Type II Clustered Wound: No Diabetes, History of pressure wounds, Osteoarthritis, Neuropathy Photos Photo Uploaded By: Alric Quan on 09/05/2016 17:08:08 Wound Measurements Length: (cm) 0 % Reduction in Width: (cm) 0 % Reduction in Depth: (cm) 0 Epithelializat Area: (cm) 0 Tunneling: Volume: (cm) 0 Undermining: Area: 100% Volume: 100% ion: Large (67-100%) No No Wound Description Classification: Grade 0 Foul Odor  Afte Wound Margin: Flat and Intact Slough/Fibrino Exudate Amount: None Present r Cleansing: No No Wound Bed Granulation Amount: None Present (0%) Necrotic Amount: None Present (0%) Periwound Skin Texture [name] [name], Janiylah C. ([id number]) Texture Color No Abnormalities Noted: No No Abnormalities Noted: No Moisture No Abnormalities Noted: No Wound Preparation Ulcer Cleansing: Rinsed/Irrigated with Saline Topical Anesthetic Applied: None Electronic Signature(s) Signed: 09/05/2016 5:31:33 PM By: Alric Quan Entered By: Alric Quan on 09/05/2016 13:49:36 [name] [name], [name] [name] ([id number]) -------------------------------------------------------------------------------- Wound Assessment Details [name] [name], Kirsi Date of Service: 09/05/2016 1:30 PM [name] Patient Account Number: [id number] Medical Record Treating RN: Ahmed Prima [id number] Number: Other Clinician: Date of Birth/Sex: [birth year] (83 y.o. Female) Treating ROBSON, MICHAEL Primary Care Finlee Concepcion: LAM, LYNN Rajni Holsworth/Extender: G Referring Milbern Doescher: LAM, LYNN Weeks in Treatment: 6 Wound Status Wound Number: 3 Primary Diabetic Wound/Ulcer of the Lower Etiology: Extremity Wound Location: Right Toe Fifth - Medial Wound Open Wounding Event: Pressure Injury Status: Date Acquired: 07/10/2016 Comorbid Cataracts, Arrhythmia, Coronary Artery Weeks Of Treatment: 4 History: Disease, Hypertension, Type II Clustered Wound: No Diabetes, History of pressure wounds, Osteoarthritis, Neuropathy Photos Photo Uploaded By: Alric Quan on 09/05/2016 17:08:58 Wound Measurements Length: (cm) 0.3 Width: (cm) 0.4 Depth: (cm) 0.3 Area: (cm) 0.094 Volume: (cm) 0.028 % Reduction in Area: 40.1% % Reduction in Volume: 80.1% Epithelialization: None Tunneling: No Undermining: No Wound Description Classification: Grade 2 Foul Odor After Wound Margin: Flat and Intact Slough/Fibrino Exudate Amount:  Large Exudate Type: Serous Exudate Color: amber Cleansing: No Yes Wound Bed Granulation Amount: None Present (0%) Exposed Structure [name] [name], Ryli C. ([id number]) Necrotic Amount: Large (67-100%) Fascia Exposed: No Necrotic Quality: Adherent Slough Fat Layer (Subcutaneous Tissue) Exposed: Yes Tendon Exposed: No Muscle Exposed: No Joint Exposed: No Bone Exposed: No Periwound Skin Texture Texture Color No Abnormalities Noted: No No Abnormalities Noted: No Moisture Temperature / Pain No Abnormalities Noted: No Temperature: No Abnormality Maceration: Yes Tenderness on Palpation: Yes Wound Preparation Ulcer Cleansing: Rinsed/Irrigated with Saline Topical Anesthetic Applied: Other: lidocaine 4%, Treatment Notes Wound #3 (Right, Medial Toe Fifth) 1. Cleansed with: Clean wound with Normal Saline 2. Anesthetic Topical Lidocaine 4% cream to wound bed prior to debridement 4. Dressing Applied: Aquacel Ag Notes coverlet band-aide Electronic Signature(s) Signed: 09/05/2016 5:31:33 PM By: Alric Quan Entered By: Alric Quan on 09/05/2016 13:49:11 [name] [name], [name] [name] ([id number]) -------------------------------------------------------------------------------- Vitals Details [name] [name], Romie Minus  Date of Service: 09/05/2016 1:30 PM [name] Patient Account Number: [id number] Medical Record Treating RN: Ahmed Prima [id number] Number: Other Clinician: Date of Birth/Sex: 1931/11/08 (83 y.o. Female) Treating ROBSON, MICHAEL Primary Care Avaeh Ewer: LAM, LYNN Bryanna Yim/Extender: G Referring Metha Kolasa: LAM, LYNN Weeks in Treatment: 6 Vital Signs Time Taken: 13:40 Temperature (F): 98.1 Height (in): 63 Pulse (bpm): 62 Weight (lbs): 203 Respiratory Rate (breaths/min): 16 Body Mass Index (BMI): 36 Blood Pressure (mmHg): 153/59 Reference Range: 80 - 120 mg / dl Electronic Signature(s) Signed: 09/05/2016 5:31:33 PM By: Alric Quan Entered By:  Alric Quan on 09/05/2016 13:43:08

## 2016-09-07 NOTE — Patient Outreach (Signed)
[facility]) Care Management  09/07/2016  [name] [name] 1931/07/10 [id number]   Return call from patient daughter [name] [name], she reported patient called her on yesterday, complaint of feeling weak, and she and her brother took patient to [facility] Emergency room.  Daughter reports patient blood count was good,and everything checked out okay, patient continues to have vaginal bleeding at times, next follow up with GYN in August.  Daughter discussed patient tolerated going out to dinner after ED visit prior to going home. She also discussed she plans to schedule follow up appointment with Dr.Dew regarding , stent to Right leg to help with healing of right foot wound.   Plan Home visit previously scheduled for today.  [name], RN, [place] Management Coordinator  [phone]- Mobile [phone]- Toll Free Main Office

## 2016-09-07 NOTE — Patient Outreach (Signed)
[facility]) Care Management   09/07/2016  [name] [name] Sep 09, 1931 [id number]  [name] [name] is an 83 y.o. female  Subjective:  Patient reports just don't feel good, weak,sleepy didn't rest well last night, complaint of sore throat reports she vomited last night denies nausea on today but only ate a few bites of breakfast, drinking water and pepsi. Patient discussed her complaint of feeling weak on yesterday, and calling 24 hour nurse line for help and was recommended to go to Emergency room. Patient  thought she may be feeling weak because she was still having daily vaginal bleeding,so her daughter took her to emergency room. Patient discussed everything checked out okay but they wanted me to check in with my doctor on today to recheck labs.  Patient discussed it took 2 people to help get her out of bed on today.  Patient discussed home health physical therapy visited on today and concerned regarding her weakness and suggested she might need to go to rehab for 24 care and daily therapy.  [facility] Lorriane Shire  called  spoke with patient discussed she had spoken with patient MD office about her being deconditioned noted weakness over the last few weeks and concern that she   may benefit from rehab stay increased therapy time. Patient voiced concern about cost,stating she needed medicaid, patient voiced concern about location of facility. Patient had her phone on speaker and Lorriane Shire explained they planned to send social worker to her home in the next day.   Objective:  BP 118/70   Pulse 65   Resp 18   SpO2 95% -Right arm Left arm 122/60  Review of Systems  Constitutional: Negative for fever and malaise/fatigue.  HENT: Positive for sore throat.   Eyes: Negative.   Respiratory: Negative.   Cardiovascular: Positive for leg swelling.  Gastrointestinal: Negative for nausea.  Genitourinary: Negative.   Skin: Negative.   Neurological:  Positive for weakness. Negative for dizziness.  Endo/Heme/Allergies: Negative.   Psychiatric/Behavioral: Negative for suicidal ideas. The patient is not nervous/anxious.        Complaint of being forgetful     Physical Exam  Constitutional: She is oriented to person, place, and time. She appears well-developed and well-nourished.  Cardiovascular: Normal rate and normal heart sounds.   Respiratory: Effort normal.  GI: Soft.  Genitourinary:  Genitourinary Comments: Patient wearing depends, noted blood spot on 1/4 of pad  not completely saturated, diaper on for at least 4 hours   Neurological: She is alert and oriented to person, place, and time.  Skin: Skin is warm and dry.     Psychiatric: She has a normal mood and affect. Her behavior is normal. Judgment and thought content normal.    Encounter Medications:   Outpatient Encounter Prescriptions as of 09/07/2016  Medication Sig Note  . ACCU-CHEK SOFTCLIX LANCETS lancets CHECK BLOOD SUGARS TWICE DAILY   . acetaminophen (TYLENOL) 325 MG tablet Take 650 mg by mouth every 6 (six) hours as needed for mild pain.   [name] aspirin 81 MG tablet Take 81 mg by mouth daily.   . bisacodyl (DULCOLAX) 5 MG EC tablet Take 5 mg by mouth daily as needed for moderate constipation.   . carvedilol (COREG) 6.25 MG tablet Take 6.25 mg by mouth 2 (two) times daily with a meal.   . furosemide (LASIX) 40 MG tablet Take 80 mg by mouth 2 (two) times daily.   . insulin aspart (NOVOLOG FLEXPEN) 100  UNIT/ML FlexPen Inject into the skin 3 (three) times daily with meals. Sliding Scale 2 units-6 units patient has list for 100-400 BS readings  100-200, 2 units, 201-250, 4 units, 251--300 6 units, 301-350, 8 units, 351 to 400 10 units. 08/02/2016: Has on hand if needed  . Insulin NPH Isophane & Regular (NOVOLIN 70/30 [place]) Inject 40-50 Units into the skin. 50 units every morning  50 units every evening 08/02/2016: Taking 56 units in the morning and evening   . Insulin Pen Needle  (RELION SHORT PEN NEEDLES) 31G X 8 MM MISC 1 each by Does not apply route 2 (two) times daily.   [name] lisinopril (PRINIVIL,ZESTRIL) 10 MG tablet Take 10 mg by mouth daily.   [name] Glycol-Propyl Glycol (SYSTANE) 0.4-0.3 % SOLN Place 1 drop into both eyes daily as needed (dry eyes).   . polyethylene glycol (MIRALAX / GLYCOLAX) packet USE 1-2 PACKETS DAILY   . pravastatin (PRAVACHOL) 40 MG tablet TAKE 1 TABLET BY MOUTH AT BEDTIME   . ranitidine (ZANTAC) 150 MG tablet Take 150 mg by mouth 2 (two) times daily.    No facility-administered encounter medications on file as of 09/07/2016.     Functional Status:   In your present state of health, do you have any difficulty performing the following activities: 09/07/2016 08/16/2016  Hearing? N N  Vision? - N  Difficulty concentrating or making decisions? Y N  Comment family assist  -  Walking or climbing stairs? Y N  Dressing or bathing? Y N  Comment has personal care providers -  Doing errands, shopping? Y -  Conservation officer, nature and eating ? Y -  Using the Toilet? Y -  In the past six months, have you accidently leaked urine? Y -  Comment wears depends -  Do you have problems with loss of bowel control? N -  Managing your Medications? Y -  Managing your Finances? Y -  Comment family helps -  Housekeeping or managing your Housekeeping? Y -  Comment has personal care help -  Some recent data might be hidden    Fall/Depression Screening:    Fall Risk  09/07/2016 06/21/2016 05/26/2016  Falls in the past year? Yes Yes No  Number falls in past yr: 1 1 -  Injury with Fall? No No -  Risk for fall due to : History of fall(s) Impaired balance/gait;Impaired mobility Impaired vision;Impaired mobility;Impaired balance/gait  Follow up Falls evaluation completed;Falls prevention discussed Falls evaluation completed;Falls prevention discussed;Education provided -   PHQ 2/9 Scores 09/07/2016 06/21/2016 05/26/2016 04/05/2016 12/28/2015 11/22/2015 11/18/2015  PHQ - 2  Score 1 1 1 1  0 1 1    Assessment:  Routine home visit  Weakness  Patient requiring increased assistance with transferring from bed to chair, chair to bedside commode requiring 2 + assistance. Patient complaint of generally not feeling well, weak,  Bilateral grips equal, sitting in wheel chair able to left legs as usual.Alert and oriented, Caregiver reports depends saturated with urine this morning. Will benefit from MD evaluation   Diabetes  Patient has checked her Blood sugar this morning 325,and reported taking insulin 70/30, she did not take sliding scale this morning. Patient only ate a few bites of breakfast, rechecked blood sugar during visit 275.    Wound Right foot Dressing in place, continuing follow up at wound clinic and home health RN visit for dressing change, anticipate next visit 8/3.  High Fall risk Review fall prevention , discussed with husband having assistance with transferring  patient .   Plan:  Placed call to PCP office, spoke with Jeanette Caprice, regarding patient condition on today, complaint of weakness, vomited on last night, but denies nausea, dizziness, chest  pain on today, plan for  patient to keep PCP post ED visit scheduled for today. Care coordination follow up call to home heath Spoke with Gerald Stabs from [facility], physical therapy reports patient more deconditioned increased weakness noted over the last few weeks. Placed call to patient son [name] per patient request to help with transportation to office visit to today , he agreed. Placed call to patient daughter [name] per patient request regarding her current condition and home health recommendations, regarding need for rehab, [name] states she believes that is a good plan for her mother. Reviewed with spouse  notify MD of new concerns   seek emergency help for worsening of symptoms or new concerns  Will send visit note to PCP.  Will follow up with patient in the next 3 business days.     THN CM Care Plan  Problem One     Most Recent Value  Care Plan Problem One  High risk for hospital admission related to chronic disease Diabetes   Role Documenting the Problem One  Care Management [place] for Problem One  Active  THN Long Term Goal   Patient will not experience a hospital admission in the next 31 days   THN Long Term Goal Start Date  08/30/16  Interventions for Problem One Long Term Goal  Advised regarding importance of taking medications as prescribed, notifiying MD of new symptoms   THN CM Short Term Goal #1   Patient will report attending all medical appointments in the next 30 days   THN CM Short Term Goal #1 Start Date  08/30/16  Interventions for Short Term Goal #1  Advised regarding importance of making follow up appointment with PCP after ED visit and  attending all speciality visits   [facility] CM Short Term Goal #2   Patient will continue to check blood sugar at least twice daily in the next 30 days   THN CM Short Term Goal #2 Start Date  08/30/16  Interventions for Short Term Goal #2  Discussed education on importance of continuing to monitor blood sugar and take insulin as prescribed to help with controlling blood sugar   THN CM Short Term Goal #3  Patient will not experience a fall in the next 30 days   THN CM Short Term Goal #3 Start Date  09/07/16  Interventions for Short [place] Goal #3  Advised regarding fall precautions, husband to get assistance when transferring patient, use bedside commode until stronger to walk to bathroom.   THN CM Short Term Goal #4  Patient will report continuing to adhere to measure to promote wound healing in the next 30 days   THN CM Short Term Goal #4 Start Date  08/30/16  Interventions for Short Term Goal #4  Discussed pumping ankles as sitting in chair     [name], RN, [place] Management Coordinator  [phone]- Mobile [phone]- [place]

## 2016-09-08 ENCOUNTER — Telehealth (INDEPENDENT_AMBULATORY_CARE_PROVIDER_SITE_OTHER): Payer: Self-pay

## 2016-09-08 DIAGNOSIS — L89612 Pressure ulcer of right heel, stage 2: Secondary | ICD-10-CM | POA: Diagnosis not present

## 2016-09-08 DIAGNOSIS — S91114D Laceration without foreign body of right lesser toe(s) without damage to nail, subsequent encounter: Secondary | ICD-10-CM | POA: Diagnosis not present

## 2016-09-08 DIAGNOSIS — N183 Chronic kidney disease, stage 3 (moderate): Secondary | ICD-10-CM | POA: Diagnosis not present

## 2016-09-08 DIAGNOSIS — I69398 Other sequelae of cerebral infarction: Secondary | ICD-10-CM | POA: Diagnosis not present

## 2016-09-08 DIAGNOSIS — E1151 Type 2 diabetes mellitus with diabetic peripheral angiopathy without gangrene: Secondary | ICD-10-CM | POA: Diagnosis not present

## 2016-09-08 DIAGNOSIS — I5042 Chronic combined systolic (congestive) and diastolic (congestive) heart failure: Secondary | ICD-10-CM | POA: Diagnosis not present

## 2016-09-08 DIAGNOSIS — I110 Hypertensive heart disease with heart failure: Secondary | ICD-10-CM | POA: Diagnosis not present

## 2016-09-08 DIAGNOSIS — E1122 Type 2 diabetes mellitus with diabetic chronic kidney disease: Secondary | ICD-10-CM | POA: Diagnosis not present

## 2016-09-08 DIAGNOSIS — M6281 Muscle weakness (generalized): Secondary | ICD-10-CM | POA: Diagnosis not present

## 2016-09-08 NOTE — Telephone Encounter (Signed)
Patient's daughter called to get her scheduled for her leg angiogram, I gave the daughter 3 dates that she can be scheduled and she felt neither would work , so she stated she will call back to schedule the angiogram after talking with her mother.

## 2016-09-09 DIAGNOSIS — E1151 Type 2 diabetes mellitus with diabetic peripheral angiopathy without gangrene: Secondary | ICD-10-CM | POA: Diagnosis not present

## 2016-09-09 DIAGNOSIS — N183 Chronic kidney disease, stage 3 (moderate): Secondary | ICD-10-CM | POA: Diagnosis not present

## 2016-09-09 DIAGNOSIS — S91114D Laceration without foreign body of right lesser toe(s) without damage to nail, subsequent encounter: Secondary | ICD-10-CM | POA: Diagnosis not present

## 2016-09-09 DIAGNOSIS — I5042 Chronic combined systolic (congestive) and diastolic (congestive) heart failure: Secondary | ICD-10-CM | POA: Diagnosis not present

## 2016-09-09 DIAGNOSIS — I69398 Other sequelae of cerebral infarction: Secondary | ICD-10-CM | POA: Diagnosis not present

## 2016-09-09 DIAGNOSIS — M6281 Muscle weakness (generalized): Secondary | ICD-10-CM | POA: Diagnosis not present

## 2016-09-09 DIAGNOSIS — I110 Hypertensive heart disease with heart failure: Secondary | ICD-10-CM | POA: Diagnosis not present

## 2016-09-09 DIAGNOSIS — L89612 Pressure ulcer of right heel, stage 2: Secondary | ICD-10-CM | POA: Diagnosis not present

## 2016-09-09 DIAGNOSIS — E1122 Type 2 diabetes mellitus with diabetic chronic kidney disease: Secondary | ICD-10-CM | POA: Diagnosis not present

## 2016-09-11 ENCOUNTER — Other Ambulatory Visit: Payer: Self-pay | Admitting: *Deleted

## 2016-09-11 DIAGNOSIS — N183 Chronic kidney disease, stage 3 (moderate): Secondary | ICD-10-CM | POA: Diagnosis not present

## 2016-09-11 DIAGNOSIS — E1151 Type 2 diabetes mellitus with diabetic peripheral angiopathy without gangrene: Secondary | ICD-10-CM | POA: Diagnosis not present

## 2016-09-11 DIAGNOSIS — E1122 Type 2 diabetes mellitus with diabetic chronic kidney disease: Secondary | ICD-10-CM | POA: Diagnosis not present

## 2016-09-11 DIAGNOSIS — I69398 Other sequelae of cerebral infarction: Secondary | ICD-10-CM | POA: Diagnosis not present

## 2016-09-11 DIAGNOSIS — I110 Hypertensive heart disease with heart failure: Secondary | ICD-10-CM | POA: Diagnosis not present

## 2016-09-11 DIAGNOSIS — S91114D Laceration without foreign body of right lesser toe(s) without damage to nail, subsequent encounter: Secondary | ICD-10-CM | POA: Diagnosis not present

## 2016-09-11 DIAGNOSIS — L89612 Pressure ulcer of right heel, stage 2: Secondary | ICD-10-CM | POA: Diagnosis not present

## 2016-09-11 DIAGNOSIS — I5042 Chronic combined systolic (congestive) and diastolic (congestive) heart failure: Secondary | ICD-10-CM | POA: Diagnosis not present

## 2016-09-11 DIAGNOSIS — M6281 Muscle weakness (generalized): Secondary | ICD-10-CM | POA: Diagnosis not present

## 2016-09-11 NOTE — Patient Outreach (Signed)
[facility]) Care Management  09/11/2016  [name] [name] [birth year] [id number]   Telephone follow up call  Placed call to patient to follow up on her recent PCP visit. Patient reports that she is feeling better, MD  has prescribed an antibiotic patient able to spell it as Cefdinir from bottle.  Patient discussed social worker from [place] has visited her on Saturday, and physical therapist visited today and states they are still working on placement for rehab to help her get stronger.   Patient discussed that she was unable to check her blood sugar today, because her meter has not worked since Friday. Patient believes she received meter from a mail order company , but she is unsure.    Plan Will update,[name] , [facility] LCSW on plans for possible SNF for rehab placement, for involvement if needed. Will place consult. Will notify PCP office regarding order need for  new CBG meter. Patient will complete full antibiotic prescription.   Will follow up with patient in the next week.    [name], RN, [place] Management Coordinator  [phone]- Mobile [phone]- Toll Free Main Office

## 2016-09-12 ENCOUNTER — Other Ambulatory Visit: Payer: Self-pay | Admitting: *Deleted

## 2016-09-12 ENCOUNTER — Encounter (INDEPENDENT_AMBULATORY_CARE_PROVIDER_SITE_OTHER): Payer: Self-pay

## 2016-09-12 DIAGNOSIS — I69398 Other sequelae of cerebral infarction: Secondary | ICD-10-CM | POA: Diagnosis not present

## 2016-09-12 DIAGNOSIS — L89612 Pressure ulcer of right heel, stage 2: Secondary | ICD-10-CM | POA: Diagnosis not present

## 2016-09-12 DIAGNOSIS — S91114D Laceration without foreign body of right lesser toe(s) without damage to nail, subsequent encounter: Secondary | ICD-10-CM | POA: Diagnosis not present

## 2016-09-12 DIAGNOSIS — I5042 Chronic combined systolic (congestive) and diastolic (congestive) heart failure: Secondary | ICD-10-CM | POA: Diagnosis not present

## 2016-09-12 DIAGNOSIS — E1151 Type 2 diabetes mellitus with diabetic peripheral angiopathy without gangrene: Secondary | ICD-10-CM | POA: Diagnosis not present

## 2016-09-12 DIAGNOSIS — N183 Chronic kidney disease, stage 3 (moderate): Secondary | ICD-10-CM | POA: Diagnosis not present

## 2016-09-12 DIAGNOSIS — I110 Hypertensive heart disease with heart failure: Secondary | ICD-10-CM | POA: Diagnosis not present

## 2016-09-12 DIAGNOSIS — E1122 Type 2 diabetes mellitus with diabetic chronic kidney disease: Secondary | ICD-10-CM | POA: Diagnosis not present

## 2016-09-12 DIAGNOSIS — M6281 Muscle weakness (generalized): Secondary | ICD-10-CM | POA: Diagnosis not present

## 2016-09-12 NOTE — Patient Outreach (Signed)
[facility]) Care Management  09/12/2016  CHARLY HUNTON 06/05/1931 [id number]  CSW was advised by [facility] RNCM that [facility] is pursuing SNF rehab for patient at this time.  CSW [facility] contacted [facility] rep and spoke with the SW who is pursuing SNF rehab placement. THN CSW offered to assist with the process and has contacted patient's PCP office to confirm receipt of the [facility] and request for completion asap. THN CSW spoke with the PCP office rep, Summer, who confirms they have the [place] and will attempt to have PCP complete and sign it by tomorrow. THN CSW updated [facility] SW and will follow up tomorrow to assist as needed with this plan.  THN CSW will also update THN RNCM of above.     [name], MSW, [place] Worker  [place] [phone]

## 2016-09-12 NOTE — Patient Outreach (Signed)
[facility]) Care Management  09/12/2016  [name] [name] 08/23/1931 [id number]   Placed call to PCP office regarding patient stating her Accu Check blood sugar meter is old and now not working and requesting a new one. Spoke with [name] to relay message.  Next telephone contact with patient within  week, to follow up regarding SNF for rehab plans and provide assist with new meter if needed prior to SNF stay.   [name], RN, [place] Management Coordinator  [phone]- Mobile [phone]- Toll Free Main Office

## 2016-09-13 ENCOUNTER — Ambulatory Visit: Payer: Self-pay | Admitting: *Deleted

## 2016-09-14 DIAGNOSIS — L89612 Pressure ulcer of right heel, stage 2: Secondary | ICD-10-CM | POA: Diagnosis not present

## 2016-09-14 DIAGNOSIS — E1122 Type 2 diabetes mellitus with diabetic chronic kidney disease: Secondary | ICD-10-CM | POA: Diagnosis not present

## 2016-09-14 DIAGNOSIS — I5042 Chronic combined systolic (congestive) and diastolic (congestive) heart failure: Secondary | ICD-10-CM | POA: Diagnosis not present

## 2016-09-14 DIAGNOSIS — M6281 Muscle weakness (generalized): Secondary | ICD-10-CM | POA: Diagnosis not present

## 2016-09-14 DIAGNOSIS — N183 Chronic kidney disease, stage 3 (moderate): Secondary | ICD-10-CM | POA: Diagnosis not present

## 2016-09-14 DIAGNOSIS — E1151 Type 2 diabetes mellitus with diabetic peripheral angiopathy without gangrene: Secondary | ICD-10-CM | POA: Diagnosis not present

## 2016-09-14 DIAGNOSIS — I69398 Other sequelae of cerebral infarction: Secondary | ICD-10-CM | POA: Diagnosis not present

## 2016-09-14 DIAGNOSIS — S91114D Laceration without foreign body of right lesser toe(s) without damage to nail, subsequent encounter: Secondary | ICD-10-CM | POA: Diagnosis not present

## 2016-09-14 DIAGNOSIS — I110 Hypertensive heart disease with heart failure: Secondary | ICD-10-CM | POA: Diagnosis not present

## 2016-09-15 ENCOUNTER — Other Ambulatory Visit: Payer: Self-pay | Admitting: *Deleted

## 2016-09-15 ENCOUNTER — Ambulatory Visit: Payer: Medicare HMO | Admitting: Cardiovascular Disease

## 2016-09-15 DIAGNOSIS — E1151 Type 2 diabetes mellitus with diabetic peripheral angiopathy without gangrene: Secondary | ICD-10-CM | POA: Diagnosis not present

## 2016-09-15 DIAGNOSIS — L89612 Pressure ulcer of right heel, stage 2: Secondary | ICD-10-CM | POA: Diagnosis not present

## 2016-09-15 DIAGNOSIS — N183 Chronic kidney disease, stage 3 (moderate): Secondary | ICD-10-CM | POA: Diagnosis not present

## 2016-09-15 DIAGNOSIS — I5042 Chronic combined systolic (congestive) and diastolic (congestive) heart failure: Secondary | ICD-10-CM | POA: Diagnosis not present

## 2016-09-15 DIAGNOSIS — E1122 Type 2 diabetes mellitus with diabetic chronic kidney disease: Secondary | ICD-10-CM | POA: Diagnosis not present

## 2016-09-15 DIAGNOSIS — I110 Hypertensive heart disease with heart failure: Secondary | ICD-10-CM | POA: Diagnosis not present

## 2016-09-15 DIAGNOSIS — S91114D Laceration without foreign body of right lesser toe(s) without damage to nail, subsequent encounter: Secondary | ICD-10-CM | POA: Diagnosis not present

## 2016-09-15 DIAGNOSIS — I69398 Other sequelae of cerebral infarction: Secondary | ICD-10-CM | POA: Diagnosis not present

## 2016-09-15 DIAGNOSIS — M6281 Muscle weakness (generalized): Secondary | ICD-10-CM | POA: Diagnosis not present

## 2016-09-15 NOTE — Patient Outreach (Signed)
[name]) Care Management  09/15/2016  [name] [name] December 12, 1931 [id number]   CSW spoke with patient's daughter,  [name] [name], who reports they are awaiting word on SNF placement. CSW spoke with Sharyn Lull, SW with [facility] agency who reports she has received the [facility] and has submitted it to area SNF's for acceptance- she advises [facility] CSW she will update as offers are received.  THN CSW will also assist with SNF placement/bed search and update as progress is made.    [name], MSW, [place] Worker  [place] [phone]

## 2016-09-18 ENCOUNTER — Other Ambulatory Visit: Payer: Self-pay | Admitting: *Deleted

## 2016-09-18 DIAGNOSIS — H35373 Puckering of macula, bilateral: Secondary | ICD-10-CM | POA: Diagnosis not present

## 2016-09-18 DIAGNOSIS — E1151 Type 2 diabetes mellitus with diabetic peripheral angiopathy without gangrene: Secondary | ICD-10-CM | POA: Diagnosis not present

## 2016-09-18 DIAGNOSIS — L89612 Pressure ulcer of right heel, stage 2: Secondary | ICD-10-CM | POA: Diagnosis not present

## 2016-09-18 DIAGNOSIS — N183 Chronic kidney disease, stage 3 (moderate): Secondary | ICD-10-CM | POA: Diagnosis not present

## 2016-09-18 DIAGNOSIS — I110 Hypertensive heart disease with heart failure: Secondary | ICD-10-CM | POA: Diagnosis not present

## 2016-09-18 DIAGNOSIS — E1122 Type 2 diabetes mellitus with diabetic chronic kidney disease: Secondary | ICD-10-CM | POA: Diagnosis not present

## 2016-09-18 DIAGNOSIS — S91114D Laceration without foreign body of right lesser toe(s) without damage to nail, subsequent encounter: Secondary | ICD-10-CM | POA: Diagnosis not present

## 2016-09-18 DIAGNOSIS — E113293 Type 2 diabetes mellitus with mild nonproliferative diabetic retinopathy without macular edema, bilateral: Secondary | ICD-10-CM | POA: Diagnosis not present

## 2016-09-18 DIAGNOSIS — H018 Other specified inflammations of eyelid: Secondary | ICD-10-CM | POA: Diagnosis not present

## 2016-09-18 DIAGNOSIS — I5042 Chronic combined systolic (congestive) and diastolic (congestive) heart failure: Secondary | ICD-10-CM | POA: Diagnosis not present

## 2016-09-18 DIAGNOSIS — I69398 Other sequelae of cerebral infarction: Secondary | ICD-10-CM | POA: Diagnosis not present

## 2016-09-18 DIAGNOSIS — M6281 Muscle weakness (generalized): Secondary | ICD-10-CM | POA: Diagnosis not present

## 2016-09-18 NOTE — Patient Outreach (Signed)
[facility]) Care Management  09/18/2016  [name] [name] 1931/07/01 [id number]  Telephone follow up call  1142 Telephone follow up call to patient, no answer unable to leave a telephone message.   [place] call to [facility] home health , regarding current home health follow up , spoke with [name] , they have patient on schedule for home health RN visit on today.  1230 Returned to patient home, spoke with patient reports she had an eye doctor appointment on this morning. Patient discussed she has not received new glucose meter yet, states she is still taking her insulin as prescribed. Patient states she daughter has tried to get meter to work and is unable, reports it is just old Patient reports she continues to take antibiotic as prescribed. She states she has a little  vaginal bleeding, but much less than it has been  . She discussed follow up visit in [place] on next week regarding cancer.  Patient reports she believes she is a little stronger, she is able to get out of bed with just her husband help now, and she was able to stand for a few seconds when therapist visited on last week to evaluate her again.  Patient discussed it will help her having  therapy more often, stating I am hoping it will be short term but I know it could be longer.Patient discussed her upcoming appointment in the next 2 weeks regarding getting possible stents to help blood flow in right leg.  Patient discussed she wanted to be closer to her home to make it easier for her husband she has been told that [facility] in [place] doesn't have any beds now, and she is interested in [facility] in [place], states she has discussed this with Education officer, museum.  Discussed with patient [facility] social worker is working along with [facility] social worker regarding her rehab stay.   Plan Will call PCP office regarding need for CBG meter, placed call spoke with  Jacqlyn Larsen at [facility] regarding patient need for new  meter. Will follow up with patient this week regarding new meter and assistance with using.  Patient will attend follow up appointment with Wound center on 8/14,her daughter has arranged transportation with [facility] transportation.   [name], RN, [place] Management Coordinator  [phone]- Mobile [phone]- Toll Free Main Office

## 2016-09-18 NOTE — Patient Outreach (Signed)
[name]) Care Management  [facility] Social Work  09/18/2016  [name] [name] [birth year] [id number]  Subjective:  " I went to the eye doctor today".  Objective: CSW to assist patient with commuity based resources to aide in her well-being, quality of life and overall safety/needs.    Current Medications:  Current Outpatient Prescriptions  Medication Sig Dispense Refill  . ACCU-CHEK SOFTCLIX LANCETS lancets CHECK BLOOD SUGARS TWICE DAILY 200 each 12  . acetaminophen (TYLENOL) 325 MG tablet Take 650 mg by mouth every 6 (six) hours as needed for mild pain.    [name] aspirin 81 MG tablet Take 81 mg by mouth daily.    . bisacodyl (DULCOLAX) 5 MG EC tablet Take 5 mg by mouth daily as needed for moderate constipation.    . carvedilol (COREG) 6.25 MG tablet Take 6.25 mg by mouth 2 (two) times daily with a meal.    . furosemide (LASIX) 40 MG tablet Take 80 mg by mouth 2 (two) times daily.    . insulin aspart (NOVOLOG FLEXPEN) 100 UNIT/ML FlexPen Inject into the skin 3 (three) times daily with meals. Sliding Scale 2 units-6 units patient has list for 100-400 BS readings  100-200, 2 units, 201-250, 4 units, 251--300 6 units, 301-350, 8 units, 351 to 400 10 units.    . Insulin NPH Isophane & Regular (NOVOLIN 70/30 [place]) Inject 40-50 Units into the skin. 50 units every morning  50 units every evening    . Insulin Pen Needle (RELION SHORT PEN NEEDLES) 31G X 8 MM MISC 1 each by Does not apply route 2 (two) times daily. 50 each 11  . lisinopril (PRINIVIL,ZESTRIL) 10 MG tablet Take 10 mg by mouth daily.    [name] Glycol-Propyl Glycol (SYSTANE) 0.4-0.3 % SOLN Place 1 drop into both eyes daily as needed (dry eyes).    . polyethylene glycol (MIRALAX / GLYCOLAX) packet USE 1-2 PACKETS DAILY 60 packet 0  . pravastatin (PRAVACHOL) 40 MG tablet TAKE 1 TABLET BY MOUTH AT BEDTIME 30 tablet 3  . ranitidine (ZANTAC) 150 MG tablet Take 150 mg by mouth 2 (two) times daily.     No current  facility-administered medications for this visit.     Functional Status:  In your present state of health, do you have any difficulty performing the following activities: 09/07/2016 08/16/2016  Hearing? N N  Vision? - N  Difficulty concentrating or making decisions? Y N  Comment family assist  -  Walking or climbing stairs? Y N  Dressing or bathing? Y N  Comment has personal care providers -  Doing errands, shopping? Y -  Conservation officer, nature and eating ? Y -  Using the Toilet? Y -  In the past six months, have you accidently leaked urine? Y -  Comment wears depends -  Do you have problems with loss of bowel control? N -  Managing your Medications? Y -  Managing your Finances? Y -  Comment family helps -  Housekeeping or managing your Housekeeping? Y -  Comment has personal care help -  Some recent data might be hidden    Fall/Depression Screening:  Fall Risk  09/07/2016 06/21/2016 05/26/2016  Falls in the past year? Yes Yes No  Number falls in past yr: 1 1 -  Injury with Fall? No No -  Risk for fall due to : History of fall(s) Impaired balance/gait;Impaired mobility Impaired vision;Impaired mobility;Impaired balance/gait  Follow up Falls evaluation completed;Falls prevention discussed Falls evaluation completed;Falls prevention discussed;Education  provided -   PHQ 2/9 Scores 09/07/2016 06/21/2016 05/26/2016 04/05/2016 12/28/2015 11/22/2015 11/18/2015  PHQ - 2 Score 1 1 1 1  0 1 1    Assessment:  Patient iis awaiting and anticipating placement in a SNF facility from home.  "[facility]"  Plan:  CSW is working with [facility] SW to seek SNF bed for patient asap. CSW has left messages reagarding placement and bed offers/availability.  At this time, no bed has been secured.  Patient reports she is going to see "the foot doctor" tomorrow and saw the "Eye doctor today". She has been using transportation services that her insurance provides.  She also reports her daughter, [name],  will be back tomorrow from vacation.   CSW will continue to seek SNF bed and to collaborate with HHSW for placement.    [name], MSW, [place] Worker  [place] [phone]

## 2016-09-19 ENCOUNTER — Encounter: Payer: Medicare HMO | Attending: Physician Assistant | Admitting: Physician Assistant

## 2016-09-19 DIAGNOSIS — K219 Gastro-esophageal reflux disease without esophagitis: Secondary | ICD-10-CM | POA: Insufficient documentation

## 2016-09-19 DIAGNOSIS — I872 Venous insufficiency (chronic) (peripheral): Secondary | ICD-10-CM | POA: Diagnosis not present

## 2016-09-19 DIAGNOSIS — I129 Hypertensive chronic kidney disease with stage 1 through stage 4 chronic kidney disease, or unspecified chronic kidney disease: Secondary | ICD-10-CM | POA: Insufficient documentation

## 2016-09-19 DIAGNOSIS — I2510 Atherosclerotic heart disease of native coronary artery without angina pectoris: Secondary | ICD-10-CM | POA: Insufficient documentation

## 2016-09-19 DIAGNOSIS — S91114D Laceration without foreign body of right lesser toe(s) without damage to nail, subsequent encounter: Secondary | ICD-10-CM | POA: Diagnosis not present

## 2016-09-19 DIAGNOSIS — M1990 Unspecified osteoarthritis, unspecified site: Secondary | ICD-10-CM | POA: Insufficient documentation

## 2016-09-19 DIAGNOSIS — Z794 Long term (current) use of insulin: Secondary | ICD-10-CM | POA: Insufficient documentation

## 2016-09-19 DIAGNOSIS — N183 Chronic kidney disease, stage 3 (moderate): Secondary | ICD-10-CM | POA: Insufficient documentation

## 2016-09-19 DIAGNOSIS — E11621 Type 2 diabetes mellitus with foot ulcer: Secondary | ICD-10-CM | POA: Diagnosis not present

## 2016-09-19 DIAGNOSIS — E1151 Type 2 diabetes mellitus with diabetic peripheral angiopathy without gangrene: Secondary | ICD-10-CM | POA: Diagnosis not present

## 2016-09-19 DIAGNOSIS — J449 Chronic obstructive pulmonary disease, unspecified: Secondary | ICD-10-CM | POA: Diagnosis not present

## 2016-09-19 DIAGNOSIS — S91301A Unspecified open wound, right foot, initial encounter: Secondary | ICD-10-CM | POA: Diagnosis not present

## 2016-09-19 DIAGNOSIS — R6889 Other general symptoms and signs: Secondary | ICD-10-CM | POA: Diagnosis not present

## 2016-09-19 DIAGNOSIS — L89612 Pressure ulcer of right heel, stage 2: Secondary | ICD-10-CM | POA: Diagnosis not present

## 2016-09-19 DIAGNOSIS — E1122 Type 2 diabetes mellitus with diabetic chronic kidney disease: Secondary | ICD-10-CM | POA: Insufficient documentation

## 2016-09-19 DIAGNOSIS — L97411 Non-pressure chronic ulcer of right heel and midfoot limited to breakdown of skin: Secondary | ICD-10-CM | POA: Insufficient documentation

## 2016-09-19 DIAGNOSIS — I110 Hypertensive heart disease with heart failure: Secondary | ICD-10-CM | POA: Diagnosis not present

## 2016-09-19 DIAGNOSIS — S91104A Unspecified open wound of right lesser toe(s) without damage to nail, initial encounter: Secondary | ICD-10-CM | POA: Diagnosis not present

## 2016-09-19 DIAGNOSIS — M6281 Muscle weakness (generalized): Secondary | ICD-10-CM | POA: Diagnosis not present

## 2016-09-19 DIAGNOSIS — E1142 Type 2 diabetes mellitus with diabetic polyneuropathy: Secondary | ICD-10-CM | POA: Insufficient documentation

## 2016-09-19 DIAGNOSIS — I5042 Chronic combined systolic (congestive) and diastolic (congestive) heart failure: Secondary | ICD-10-CM | POA: Diagnosis not present

## 2016-09-19 DIAGNOSIS — I69398 Other sequelae of cerebral infarction: Secondary | ICD-10-CM | POA: Diagnosis not present

## 2016-09-20 ENCOUNTER — Encounter: Payer: Self-pay | Admitting: Pharmacy Technician

## 2016-09-20 ENCOUNTER — Other Ambulatory Visit: Payer: Self-pay | Admitting: *Deleted

## 2016-09-20 ENCOUNTER — Other Ambulatory Visit: Payer: Self-pay | Admitting: Pharmacy Technician

## 2016-09-20 NOTE — Patient Outreach (Signed)
[facility]) Care Management  09/20/2016  PEGGI YONO 09/04/1931 [id number]   Follow up telephone call  Placed call to patient to follow up on blood sugar meter, patient discussed she thinks she [facility] but has not picked up meter.  Patient discussed her recent visit to wound clinic and that the heel wound is healing , but the toe is a little slower. Patient discussed her daughter [name] is working on getting appointment for follow up about stent , she also discussed that she has follow up appointment in [facility] regarding vaginal bleeding on 8/28. Patient discussed she still feels like it will be best for her to go into the SNF for rehab, just wants to be close to so husband can visit.   Spoke with patient daughter [name], she discussed that she is working on scheduling follow up with Dr.Dew regarding stents, after patient has GYN appointment. [name] discussed concern regarding right 5th toe slow healing and patient needing daily wound care and she will coordinate with patient sister in law changing dressing on days that home health not visiting.    Plan [facility], representative reports meter/strips are ready for pickup. Placed call to notify patient, discussed to notify RN if she needs assistance with using new meter.  Will plan follow up call in the next week.    [name], RN, [place] Management Coordinator  [phone]- Mobile [phone]- Toll Free Main Office

## 2016-09-20 NOTE — Patient Outreach (Signed)
[facility]) Care Management  [facility] Social Work  09/20/2016  [name] [name] 29-Mar-1931 [id number]  Subjective:  Went to the foot doctor and need stints placed.  Objective: CSW to assist patient with commuity based resources to aide in her well-being, quality of life and overall safety/needs.    Encounter Medications:  Outpatient Encounter Prescriptions as of 09/20/2016  Medication Sig Note  . ACCU-CHEK SOFTCLIX LANCETS lancets CHECK BLOOD SUGARS TWICE DAILY   . acetaminophen (TYLENOL) 325 MG tablet Take 650 mg by mouth every 6 (six) hours as needed for mild pain.   [name] aspirin 81 MG tablet Take 81 mg by mouth daily.   . bisacodyl (DULCOLAX) 5 MG EC tablet Take 5 mg by mouth daily as needed for moderate constipation.   . carvedilol (COREG) 6.25 MG tablet Take 6.25 mg by mouth 2 (two) times daily with a meal.   . furosemide (LASIX) 40 MG tablet Take 80 mg by mouth 2 (two) times daily.   . insulin aspart (NOVOLOG FLEXPEN) 100 UNIT/ML FlexPen Inject into the skin 3 (three) times daily with meals. Sliding Scale 2 units-6 units patient has list for 100-400 BS readings  100-200, 2 units, 201-250, 4 units, 251--300 6 units, 301-350, 8 units, 351 to 400 10 units. 08/02/2016: Has on hand if needed  . Insulin NPH Isophane & Regular (NOVOLIN 70/30 ) Inject 40-50 Units into the skin. 50 units every morning  50 units every evening 08/02/2016: Taking 56 units in the morning and evening   . Insulin Pen Needle (RELION SHORT PEN NEEDLES) 31G X 8 MM MISC 1 each by Does not apply route 2 (two) times daily.   [name] lisinopril (PRINIVIL,ZESTRIL) 10 MG tablet Take 10 mg by mouth daily.   [name] Glycol-Propyl Glycol (SYSTANE) 0.4-0.3 % SOLN Place 1 drop into both eyes daily as needed (dry eyes).   . polyethylene glycol (MIRALAX / GLYCOLAX) packet USE 1-2 PACKETS DAILY   . pravastatin (PRAVACHOL) 40 MG tablet TAKE 1 TABLET BY MOUTH AT BEDTIME   . ranitidine (ZANTAC) 150 MG tablet Take 150  mg by mouth 2 (two) times daily.    No facility-administered encounter medications on file as of 09/20/2016.     Functional Status:  In your present state of health, do you have any difficulty performing the following activities: 09/07/2016 08/16/2016  Hearing? N N  Vision? - N  Difficulty concentrating or making decisions? Y N  Comment family assist  -  Walking or climbing stairs? Y N  Dressing or bathing? Y N  Comment has personal care providers -  Doing errands, shopping? Y -  Conservation officer, nature and eating ? Y -  Using the Toilet? Y -  In the past six months, have you accidently leaked urine? Y -  Comment wears depends -  Do you have problems with loss of bowel control? N -  Managing your Medications? Y -  Managing your Finances? Y -  Comment family helps -  Housekeeping or managing your Housekeeping? Y -  Comment has personal care help -  Some recent data might be hidden    Fall/Depression Screening:  PHQ 2/9 Scores 09/07/2016 06/21/2016 05/26/2016 04/05/2016 12/28/2015 11/22/2015 11/18/2015  PHQ - 2 Score 1 1 1 1  0 1 1    Assessment:  CSW is working to assist with NF placement as patient is not safe to be home with her husband at the level of physical support and assistance she is requiring.  This [facility]  CSW has been speaking with [facility] SW who had implied FL2 was completed and sent out last week to SNF's, but who now is not certain it has been completed and sent to SNF's. Per [facility] SW, the PCP never faxed the FL2 to her.  CSW is working with patient and her family; along with PCP and [facility] Social Worker, Sharyn Lull, to secure bed offers.  CSW spoke with PCP's office today and left message as PCP is not in. CSW plans to ask that FL2 be faxed to [place], [facility] SW and to area SNF's if they can.  CSW has communicated with [facility] SW to offer assistance with process and securing of a bed and will continue to do so in hopes of placement asap.      Plan: CSW will call PCP  back tomorrow if no response to determine status of FL2 completion and request copy be sent to [facility] CSW.     THN CM Care Plan Problem One     Most Recent Value  Care Plan Problem One  Patient needing higher level of care than at home.  Role Documenting the Problem One  Clinical Social Worker  Care Plan for Problem One  Active  [facility] Long Term Goal   Patient will be placed in a facility in the next 90days.  [facility]N Long Term Goal Start Date  09/15/16  Interventions for Problem One Long Term Goal  CSW assisting and coordianating placement with patient, family, PCP and [place] SW.   [facility] CM Short Term Goal #1      Interventions for Short Term Goal #1     [facility] CM Short Term Goal #2      Interventions for Short Term Goal #2     THN CM Short Term Goal #3     Interventions for Short Tern Goal #3     THN CM Short Term Goal #4     Interventions for Short Term Goal #4          [name], MSW, [place] Worker  [place] [phone]

## 2016-09-20 NOTE — Patient Outreach (Signed)
[place]) Care Management  09/20/2016  [name] [name] Mar 28, 1931 [id number]  I followed up with patient's daughter [name] in reference to patient assistance application for [employer] for [employer] that I mailed two weeks ago. Per the conversation that [employer], Rph had with the patient her daughter would be able to assist with filling the application out and returning them to [facility]. When I spoke with [name] she stated she lives in [place] but her mother has a caregiver that's in the home daily. She is going to ask the caregiver to locate the application and call me to assist her with filling in the necessary information. I also informed her that I am willing to go to the patient's home if that will make the process easier. She states that either she or the caregiver would follow-up with me.  Doreene Burke, [place] [phone]

## 2016-09-20 NOTE — Patient Outreach (Signed)
[facility]) Care Management  09/20/2016  [name] [name] [birth year] [id number]   Erroneous error!

## 2016-09-21 ENCOUNTER — Other Ambulatory Visit: Payer: Self-pay | Admitting: *Deleted

## 2016-09-21 ENCOUNTER — Encounter (INDEPENDENT_AMBULATORY_CARE_PROVIDER_SITE_OTHER): Payer: Self-pay

## 2016-09-21 DIAGNOSIS — S91114D Laceration without foreign body of right lesser toe(s) without damage to nail, subsequent encounter: Secondary | ICD-10-CM | POA: Diagnosis not present

## 2016-09-21 DIAGNOSIS — E1122 Type 2 diabetes mellitus with diabetic chronic kidney disease: Secondary | ICD-10-CM | POA: Diagnosis not present

## 2016-09-21 DIAGNOSIS — L89612 Pressure ulcer of right heel, stage 2: Secondary | ICD-10-CM | POA: Diagnosis not present

## 2016-09-21 DIAGNOSIS — N183 Chronic kidney disease, stage 3 (moderate): Secondary | ICD-10-CM | POA: Diagnosis not present

## 2016-09-21 DIAGNOSIS — E1151 Type 2 diabetes mellitus with diabetic peripheral angiopathy without gangrene: Secondary | ICD-10-CM | POA: Diagnosis not present

## 2016-09-21 DIAGNOSIS — I69398 Other sequelae of cerebral infarction: Secondary | ICD-10-CM | POA: Diagnosis not present

## 2016-09-21 DIAGNOSIS — I5042 Chronic combined systolic (congestive) and diastolic (congestive) heart failure: Secondary | ICD-10-CM | POA: Diagnosis not present

## 2016-09-21 DIAGNOSIS — I110 Hypertensive heart disease with heart failure: Secondary | ICD-10-CM | POA: Diagnosis not present

## 2016-09-21 DIAGNOSIS — M6281 Muscle weakness (generalized): Secondary | ICD-10-CM | POA: Diagnosis not present

## 2016-09-21 NOTE — Patient Outreach (Signed)
[facility]) Care Management  09/21/2016  [name] [name] 10/23/1931 [id number]   Telephone assessment   Incoming call from patient stating she has received her blood sugar meter and needs help with setting it up. Patient also voiced concern regarding vaginal bleeding, states it is still  daily some days worse than others.  Patient reports she only changes her diaper once daily, puts a new one on in the morning then changes it at night, discussed  difficulty she has changing it alone   Patient states she called her PCP office on today to discuss.  Placed to PCP office to discuss concern able to leave a message on [name], nurse line requesting a return call.  [place] call to Patient daughter [name] [name] to discuss patient concern . Discussed notifying GYN doctor in [place] to inform of vaginal bleeding. I was able to get MD contact number from daughter. .  [phone] Placed call to Dr.Soper office to notify of concern regarding vaginal bleeding for further  Recommendations, able to leave a message for RN [name] [name]. Received return call form [name] [name] stated she has spoken with patient daughter [name] [name] regarding scheduling an earlier office visit to evaluate bleeding, [name] [name] to contact office again on tomorrow about appointment.  Discussed recent hemoglobin 10.3 at visit to ED at [facility].   Plan Plan home visit on next day to assist with blood sugar meter.  [name] Draft, RN, [place] Management Coordinator  [phone]- Mobile [phone]- Toll Free Main Office

## 2016-09-21 NOTE — Progress Notes (Signed)
[name] [name], ANNETT ([id number]) Visit Report for 09/19/2016 Chief Complaint Document Details [name] [name], JESSAMYN 09/19/2016 1:30 Patient Name: Date of Service: C. PM Medical Record Patient Account Number: [id number] Number: Treating RN: Montey Hora Date of Birth/Sex: 07-11-1931 (83 y.o. Female) Other Clinician: Primary Care Provider: LAM, LYNN Treating STONE III, HOYT Referring Provider: LAM, LYNN Provider/Extender: Weeks in Treatment: 8 Information Obtained from: Patient Chief Complaint 07/19/16; patient is here for review of a wound on the right heel Electronic Signature(s) Signed: 09/20/2016 1:25:22 AM By: Worthy Keeler PA-C Entered By: Worthy Keeler on 09/19/2016 14:01:51 [name] [name], [name] [name] ([id number]) -------------------------------------------------------------------------------- HPI Details [name] [name], [name] [name] 09/19/2016 1:30 Patient Name: Date of Service: C. PM Medical Record Patient Account Number: [id number] Number: Treating RN: Montey Hora Date of Birth/Sex: Jun 19, 1931 (83 y.o. Female) Other Clinician: Primary Care Provider: LAM, LYNN Treating STONE III, HOYT Referring Provider: LAM, LYNN Provider/Extender: Weeks in Treatment: 8 History of Present Illness HPI Description: 07/19/16; this is an 83 year old woman who tells me that she has had a wound on her right heel for about a month and a half. She said she was hospitalized with postmenopausal vaginal bleeding and was hospitalized ultimately at [facility] for this. I do not have these records. Afterward she was sent to [place] for rehabilitation. She states there she developed a wound on her foot. The patient is a type II diabetic on insulin, I'm not really sure of her diabetic control. She has well care or home health we think they are putting Silvadene cream on this. ABIs in our clinic were 0.6 bilaterally. The patient and her husband it was somewhat vague history  of possible stent placement in the right thigh 2-3 years ago although I don't see anything about this and [place] link. We will need to research this more before we see her again next week. She did have a normal x-ray of the heel at the end of May I believe when she was seen in the ER. There was no evidence of osteomyelitis although soft tissue edema was noted. Looking through care everywhere and [place] link I can see no reference to vascular surgery, noninvasive vascular tests. It is possible that this was non-at the local vein and vascular Center before they became part of the network. This would be Dr. Lucky Cowboy and Dr. Delana Meyer we will check with their office 07/26/16; the patient's arterial studies were really not very good she has an ABI in the right of 0.52 and a TBI on the right of 0.34. Corresponding values on the left for better at 0.83 on the left and a TBI of 0.54. She was felt to have right mid SFA occlusion and a 50-74% long segment stenosis and diffuse 30-49% left femoral popliteal disease with an occluded left tibial peroneal trunk. In spite of this her wound on her right heel appears to be a lot better today. Smaller with a healthy granulated surface. I have research notes in [place]. The patient is indeed followed by Dr. Gwenlyn Found of cardiology for coronary artery disease status post coronary artery bypass grafting about 7 years ago. He also notes a history of PAD and procedures by Dr. dew in [place]. In spite of this the patient's wound is better today. She is nonambulatory does not complain of pain or claudication and really I don't think given everything that she requires an arteriogram or an invasive arterial evaluation. 08/01/16; in spite of her likely significant PAD the patient's wound continues to contract. As  long as this is the case I am going to forego additional arterial consultation. We have been using silver alginate. She is down 0.4 cm in length  today 08/08/16; patient has an appointment with Dr. Fletcher Anon today in follow-up for her PAD. Vascular studies that we did last month I think are the cause of this. She has a new wound on her lateral right fifth toe and an unruptured blister on the medial left heel. Her major wound on the right heel is at 1.5 x 1.5 x 0.1 09/05/16; the patient was last here she had vascular studies done by Dr. Fletcher Anon. This showed a ABI on the right at 0.5 to and on the left at 0.83. TBI is on the right at 0.34 on the left at 0.54. She has been subsequently seen by Dr. dew who is somewhat annoyed that we have sent her to cardiology for her KYNNADI, DICENSO. ([id number]) studies. Nevertheless he felt that it necessary to proceed with an arteriogram for possible salvage. She has 2 remaining wounds one on the right heel one on the medial aspect of the right fifth toe which was new the last visit. She had an area over the left lateral malleolus which is resolved. 09/19/16 on evaluation today patient appears to be doing fairly well in regard to her right lower extremity wound. She still has not had her vascular procedure for stent placement due to conflict with other appointments. For that reason her wounds do appear to be healing albeit slowly. No fevers, chills, nausea, or vomiting noted at this time. Electronic Signature(s) Signed: 09/20/2016 1:25:22 AM By: Worthy Keeler PA-C Entered By: Worthy Keeler on 09/19/2016 14:12:46 [name] [name], [name] [name] ([id number]) -------------------------------------------------------------------------------- Physical Exam Details [name] [name], [name] [name] 09/19/2016 1:30 Patient Name: Date of Service: C. PM Medical Record Patient Account Number: [id number] Number: Treating RN: Montey Hora Date of Birth/Sex: [birth year] (83 y.o. Female) Other Clinician: Primary Care Provider: LAM, LYNN Treating STONE III, HOYT Referring Provider: LAM, LYNN Provider/Extender: Weeks in  Treatment: 8 Constitutional Well-nourished and well-hydrated in no acute distress. Respiratory normal breathing without difficulty. clear to auscultation bilaterally. Cardiovascular regular rate and rhythm with normal S1, S2. Psychiatric this patient is able to make decisions and demonstrates good insight into disease process. Alert and Oriented x 3. pleasant and cooperative. Notes Patient has no erythema surrounding the wound although granulation tissue does appear to be pale which I think is a result of the vascular compromise. Placed although that has not been rescheduled. No debridement was one today. Electronic Signature(s) Signed: 09/20/2016 1:25:22 AM By: Worthy Keeler PA-C Entered By: Worthy Keeler on 09/19/2016 14:13:14 [name] [name], [name] [name] ([id number]) -------------------------------------------------------------------------------- Physician Orders Details [name] [name], Jazzmon 09/19/2016 1:30 Patient Name: Date of Service: C. PM Medical Record Patient Account Number: [id number] Number: Treating RN: Montey Hora Date of Birth/Sex: [birth year] (83 y.o. 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 Wound #3 Right,Medial Toe Fifth o Cleanse wound with mild soap  and water o May Shower, gently pat wound dry prior to applying new  dressing. Anesthetic Wound #1 Right Calcaneus o Topical Lidocaine 4% cream applied to wound bed prior to debridement - in clinic Wound #3 Right,Medial Toe Fifth o Topical Lidocaine 4% cream applied to wound bed prior to debridement - in clinic Primary Wound Dressing Wound #1 Right Calcaneus o Prisma Ag Wound #3 Right,Medial Toe Fifth o Aquacel Ag Secondary Dressing Wound #1 Right Calcaneus o Gauze and Kerlix/Conform o Other - heel pads [name] [name], [name] ([id number]) Wound #3 Right,Medial Toe Fifth o Other - coverlet band-aide Dressing Change Frequency Wound #1 Right Calcaneus o Change Dressing Monday, Wednesday, Friday - she comes to the wound clinic wednesdays Wound #3 Right,Medial Toe Fifth o Change dressing every day. Follow-up Appointments Wound #1 Right Calcaneus o Return Appointment in 1 week. Wound #3 Right,Medial Toe Fifth o Return Appointment in 1 week. Edema Control Wound #1 Right Calcaneus o Elevate legs to the level of the heart and pump ankles as often as possible Wound #3 Right,Medial Toe Fifth o Elevate legs to the level of the heart and pump ankles as often as possible Off-Loading Wound #1 Right Calcaneus o Other: - elevated legs on pillows Wound #3 Right,Medial Toe Fifth o Other: - elevated legs on pillows Additional Orders / Instructions Wound #1 Right Calcaneus o Increase protein intake. Wound #3 Right,Medial Toe Fifth o Increase protein intake. Home Health Wound #1 Right [facility] [place] Visits - [place] Nurse may visit PRN to address patientos wound care needs. o FACE TO FACE ENCOUNTER: MEDICARE and MEDICAID PATIENTS: I certify that this patient is under my care and that I had a face-to-face encounter that meets the physician face-to-face encounter requirements with this patient on this date. The encounter with the patient was in whole or in part for the following  MEDICAL CONDITION: (primary reason for [place]) [name] [name], LIANNY MOLTER ([id number]) MEDICAL NECESSITY: I certify, that based on my findings, NURSING services are a medically necessary home health service. HOME BOUND STATUS: I certify that my clinical findings support that this patient is homebound (i.e., Due to illness or injury, pt requires aid of supportive devices such as crutches, cane, wheelchairs, walkers, the use of special transportation or the assistance of another person to leave their place of residence. There is a normal inability to leave the home and doing so requires considerable and taxing effort. Other absences are for medical reasons / religious services and are infrequent or of short duration when for other reasons). o If current dressing causes regression in wound condition, may D/C ordered dressing product/s and apply Normal Saline Moist Dressing daily until next [place] / Other MD appointment. [place] of regression in wound condition at [phone]. o Please direct any NON-WOUND related issues/requests for orders to patient's Primary Care Physician Wound #3 Right,Medial Toe [place] Visits - [place] Nurse may visit PRN to address patientos wound care needs. o FACE TO FACE ENCOUNTER: MEDICARE and MEDICAID PATIENTS: I certify that this patient is under my care and that I had a face-to-face encounter that meets the physician face-to-face encounter requirements with this patient on this date. The encounter with the patient was in whole or in part for the following MEDICAL CONDITION: (primary reason for [place]) MEDICAL NECESSITY: I certify, that based on my findings, NURSING services are a medically necessary home health service. HOME BOUND  STATUS: I certify that my clinical findings support that this patient is homebound (i.e., Due to illness or injury, pt requires aid  of supportive devices such as crutches, cane, wheelchairs, walkers, the use of special transportation or the assistance of another person to leave their place of residence. There is a normal inability to leave the home and doing so requires considerable and taxing effort. Other absences are for medical reasons / religious services and are infrequent or of short duration when for other reasons). o If current dressing causes regression in wound condition, may D/C ordered dressing product/s and apply Normal Saline Moist Dressing daily until next [place] / Other MD appointment. [place] of regression in wound condition at [phone]. o Please direct any NON-WOUND related issues/requests for orders to patient's Primary Care Physician Electronic Signature(s) Signed: 09/19/2016 4:51:44 PM By: Montey Hora Signed: 09/20/2016 1:25:22 AM By: Worthy Keeler PA-C Entered By: Montey Hora on 09/19/2016 14:10:00 [name] [name], [name] [name] ([id number]) -------------------------------------------------------------------------------- Problem List Details [name] [name], [name] [name] 09/19/2016 1:30 Patient Name: Date of Service: C. PM Medical Record Patient Account Number: [id number] Number: Treating RN: Montey Hora Date of Birth/Sex: 29-May-1931 (83 y.o. 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 07/19/2016 Yes limited to breakdown of skin E11.51 Type 2 diabetes mellitus with diabetic peripheral 07/19/2016 Yes angiopathy without gangrene E11.42 Type 2 diabetes mellitus with diabetic polyneuropathy 07/19/2016 Yes Inactive Problems Resolved  Problems Electronic Signature(s) Signed: 09/20/2016 1:25:22 AM By: Worthy Keeler PA-C Entered By: Worthy Keeler on 09/19/2016 14:01:30 [name] [name], [name] [name] ([id number]) -------------------------------------------------------------------------------- Progress Note Details [name] [name], [name] [name] 09/19/2016 1:30 Patient Name: Date of Service: C. PM Medical Record Patient Account Number: [id number] Number: Treating RN: Montey Hora Date of Birth/Sex: 1931-11-04 (83 y.o. Female) Other Clinician: Primary Care Provider: LAM, LYNN Treating STONE III, HOYT Referring Provider: LAM, LYNN Provider/Extender: Weeks in Treatment: 8 Subjective Chief Complaint Information obtained from Patient 07/19/16; patient is here for review of a wound on the right heel History of Present Illness (HPI) 07/19/16; this is an 83 year old woman who tells me that she has had a wound on her right heel for about a month and a half. She said she was hospitalized with postmenopausal vaginal bleeding and was hospitalized ultimately at [facility] for this. I do not have these records. Afterward she was sent to [place] for rehabilitation. She states there she developed a wound on her foot. The patient is a type II diabetic on insulin, I'm not really sure of her diabetic control. She has well care or home health we think they are putting Silvadene cream on this. ABIs in our clinic were 0.6 bilaterally. The patient and her husband it was somewhat vague history of possible stent placement in the right thigh 2-3 years ago although I don't see anything about this and [place] link. We will need to research this more before we see her again next week. She did have a normal x-ray of the heel at the end of May I believe when she was seen in the ER. There was no evidence of osteomyelitis although soft tissue edema was noted. Looking through care everywhere and [place] link I can see no reference to  vascular surgery, noninvasive vascular tests. It is  possible that this was non-at the local vein and vascular Center before they became part of the network. This would be Dr. Lucky Cowboy and Dr. Delana Meyer we will check with their office 07/26/16; the patient's arterial studies were really not very good she has an ABI in the right of 0.52 and a TBI on the right of 0.34. Corresponding values on the left for better at 0.83 on the left and a TBI of 0.54. She was felt to have right mid SFA occlusion and a 50-74% long segment stenosis and diffuse 30-49% left femoral popliteal disease with an occluded left tibial peroneal trunk. In spite of this her wound on her right heel appears to be a lot better today. Smaller with a healthy granulated surface. I have research notes in [place]. The patient is indeed followed by Dr. Gwenlyn Found of cardiology for coronary artery disease status post coronary artery bypass grafting about 7 years ago. He also notes a history of PAD and procedures by Dr. dew in [place]. In spite of this the patient's wound is better today. She is nonambulatory does not complain of pain or claudication and really I don't think given everything that she requires an arteriogram or an invasive arterial evaluation. 08/01/16; in spite of her likely significant PAD the patient's wound continues to contract. As long as this is the case I am going to forego additional arterial consultation. We have been using silver alginate. She is down 0.4 cm in length today [name] [name], TAYTEN HEBER ([id number]) 08/08/16; patient has an appointment with Dr. Fletcher Anon today in follow-up for her PAD. Vascular studies that we did last month I think are the cause of this. She has a new wound on her lateral right fifth toe and an unruptured blister on the medial left heel. Her major wound on the right heel is at 1.5 x 1.5 x 0.1 09/05/16; the patient was last here she had vascular studies done by Dr. Fletcher Anon. This showed a ABI on  the right at 0.5 to and on the left at 0.83. TBI is on the right at 0.34 on the left at 0.54. She has been subsequently seen by Dr. dew who is somewhat annoyed that we have sent her to cardiology for her studies. Nevertheless he felt that it necessary to proceed with an arteriogram for possible salvage. She has 2 remaining wounds one on the right heel one on the medial aspect of the right fifth toe which was new the last visit. She had an area over the left lateral malleolus which is resolved. 09/19/16 on evaluation today patient appears to be doing fairly well in regard to her right lower extremity wound. She still has not had her vascular procedure for stent placement due to conflict with other appointments. For that reason her wounds do appear to be healing albeit slowly. No fevers, chills, nausea, or vomiting noted at this time. Objective Constitutional Well-nourished and well-hydrated in no acute distress. Vitals Time Taken: 1:45 PM, Height: 63 in, Weight: 203 lbs, BMI: 36, Temperature: 98.3 F, Pulse: 64 bpm, Respiratory Rate: 20 breaths/min, Blood Pressure: 149/87 mmHg. Respiratory normal breathing without difficulty. clear to auscultation bilaterally. Cardiovascular regular rate and rhythm with normal S1, S2. Psychiatric this patient is able to make decisions and demonstrates good insight into disease process. Alert and Oriented x 3. pleasant and cooperative. General Notes: Patient has no erythema surrounding the wound although granulation tissue does appear to be pale which I think is a result of the vascular compromise. Placed  although that has not been rescheduled. No debridement was one today. Integumentary (Hair, Skin) Wound #1 status is Open. Original cause of wound was Gradually Appeared. The wound is located on the Right Calcaneus. The wound measures 0.4cm length x 0.3cm width x 0.1cm depth; 0.094cm^2 area and 0.009cm^3 volume. There is Fat Layer (Subcutaneous Tissue)  Exposed exposed. There is no tunneling or [name] [name], Desmond C. ([id number]) undermining noted. There is a large amount of serous drainage noted. The wound margin is distinct with the outline attached to the wound base. There is large (67-100%) pale granulation within the wound bed. There is a small (1-33%) amount of necrotic tissue within the wound bed including Adherent Slough. The periwound skin appearance did not exhibit: Callus, Crepitus, Excoriation, Induration, Rash, Scarring, Dry/Scaly, Maceration, Atrophie Blanche, Cyanosis, Ecchymosis, Hemosiderin Staining, Mottled, Pallor, Rubor, Erythema. Periwound temperature was noted as No Abnormality. Wound #3 status is Open. Original cause of wound was Pressure Injury. The wound is located on the Right,Medial Toe Fifth. The wound measures 0.2cm length x 0.1cm width x 0.2cm depth; 0.016cm^2 area and 0.003cm^3 volume. There is Fat Layer (Subcutaneous Tissue) Exposed exposed. There is no tunneling or undermining noted. There is a large amount of serous drainage noted. The wound margin is flat and intact. There is medium (34-66%) red granulation within the wound bed. There is a medium (34-66%) amount of necrotic tissue within the wound bed including Adherent Slough. The periwound skin appearance exhibited: Maceration. Periwound temperature was noted as No Abnormality. The periwound has tenderness on palpation. Assessment Active Problems ICD-10 E11.621 - Type 2 diabetes mellitus with foot ulcer L97.411 - Non-pressure chronic ulcer of right heel and midfoot limited to breakdown of skin E11.51 - Type 2 diabetes mellitus with diabetic peripheral angiopathy without gangrene E11.42 - Type 2 diabetes mellitus with diabetic polyneuropathy Plan Wound Cleansing: Wound #1 Right Calcaneus: Cleanse wound with mild soap and water May Shower, gently pat wound dry prior to applying new dressing. Wound #3 Right,Medial Toe Fifth: Cleanse wound with mild  soap and water May Shower, gently pat wound dry prior to applying new dressing. Anesthetic: Wound #1 Right Calcaneus: Topical Lidocaine 4% cream applied to wound bed prior to debridement - in clinic Wound #3 Right,Medial Toe Fifth: Topical Lidocaine 4% cream applied to wound bed prior to debridement - in clinic Primary Wound Dressing: Wound #1 Right Calcaneus: [name] [name], Rileigh C. ([id number]) Prisma Ag Wound #3 Right,Medial Toe Fifth: Aquacel Ag Secondary Dressing: Wound #1 Right Calcaneus: Gauze and Kerlix/Conform Other - heel pads Wound #3 Right,Medial Toe Fifth: Other - coverlet band-aide Dressing Change Frequency: Wound #1 Right Calcaneus: Change Dressing Monday, Wednesday, Friday - she comes to the wound clinic wednesdays Wound #3 Right,Medial Toe Fifth: Change dressing every day. Follow-up Appointments: Wound #1 Right Calcaneus: Return Appointment in 1 week. Wound #3 Right,Medial Toe Fifth: Return Appointment in 1 week. Edema Control: Wound #1 Right Calcaneus: Elevate legs to the level of the heart and pump ankles as often as possible Wound #3 Right,Medial Toe Fifth: Elevate legs to the level of the heart and pump ankles as often as possible Off-Loading: Wound #1 Right Calcaneus: Other: - elevated legs on pillows Wound #3 Right,Medial Toe Fifth: Other: - elevated legs on pillows Additional Orders / Instructions: Wound #1 Right Calcaneus: Increase protein intake. Wound #3 Right,Medial Toe Fifth: Increase protein intake. Home Health: Wound #1 Right Calcaneus: Continue Home Health Visits - [place] Nurse may visit PRN to address patient s wound care needs. FACE  TO FACE ENCOUNTER: MEDICARE and MEDICAID PATIENTS: I certify that this patient is under my care and that I had a face-to-face encounter that meets the physician face-to-face encounter requirements with this patient on this date. The encounter with the patient was in whole or in part for  the following MEDICAL CONDITION: (primary reason for [place]) MEDICAL NECESSITY: I certify, that based on my findings, NURSING services are a medically necessary home health service. HOME BOUND STATUS: I certify that my clinical findings support that this patient is homebound (i.e., Due to illness or injury, pt requires aid of supportive devices such as crutches, cane, wheelchairs, walkers, the use of special transportation or the assistance of another person to leave their place of residence. There is a normal inability to leave the home and doing so requires considerable and taxing effort. Other absences are for medical reasons / religious services and are infrequent or of short duration when for other reasons). If current dressing causes regression in wound condition, may D/C ordered dressing product/s and apply Normal Saline Moist Dressing daily until next [place] / Other MD appointment. [place] of regression in wound condition at [phone]. [name] [name], [name] [name] ([id number]) Please direct any NON-WOUND related issues/requests for orders to patient's Primary Care Physician Wound #3 Right,Medial Toe Fifth: [place] Visits - [place] Nurse may visit PRN to address patient s wound care needs. FACE TO FACE ENCOUNTER: MEDICARE and MEDICAID PATIENTS: I certify that this patient is under my care and that I had a face-to-face encounter that meets the physician face-to-face encounter requirements with this patient on this date. The encounter with the patient was in whole or in part for the following MEDICAL CONDITION: (primary reason for [place]) MEDICAL NECESSITY: I certify, that based on my findings, NURSING services are a medically necessary home health service. HOME BOUND STATUS: I certify that my clinical findings support that this patient is homebound (i.e., Due to illness or injury, pt requires aid of supportive  devices such as crutches, cane, wheelchairs, walkers, the use of special transportation or the assistance of another person to leave their place of residence. There is a normal inability to leave the home and doing so requires considerable and taxing effort. Other absences are for medical reasons / religious services and are infrequent or of short duration when for other reasons). If current dressing causes regression in wound condition, may D/C ordered dressing product/s and apply Normal Saline Moist Dressing daily until next [place] / Other MD appointment. Arjay of regression in wound condition at [phone]. Please direct any NON-WOUND related issues/requests for orders to patient's Primary Care Physician Recommended patient that she does need to follow up with vascular stress the need to make that appointment for surgery and stent placement. With that being said meantime we will continue appropriate wound care measures changes please see above for the current wound care orders. If anything worsens in the interim patient office for additional recommendations. Otherwise we will see her for evaluation in 1 week Electronic Signature(s) Signed: 09/20/2016 1:25:22 AM By: Worthy Keeler PA-C Entered By: Worthy Keeler on 09/19/2016 14:14:22 [name] [name], [name] [name] ([id number]) -------------------------------------------------------------------------------- SuperBill Details [name] [name], [place] Date of Service: 09/19/2016 [name] Patient Account Number: [id number] Medical Record Treating RN: Montey Hora [id number] Number: Other Clinician: Date of Birth/Sex: 1931-06-28 (83 y.o. 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 E11.51 Type 2 diabetes mellitus with diabetic peripheral angiopathy without gangrene E11.42 Type 2 diabetes mellitus with diabetic polyneuropathy Facility Procedures CPT4 Code: [id number] Description: 99214 - WOUND CARE VISIT-LEV 4 EST PT Modifier: Quantity: 1 Physician Procedures CPT4: Description Modifier Quantity Code [id number] [id number] - WC PHYS LEVEL 3 - EST PT 1 ICD-10 Description Diagnosis E11.621 Type 2 diabetes mellitus with foot ulcer 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

## 2016-09-22 ENCOUNTER — Other Ambulatory Visit (INDEPENDENT_AMBULATORY_CARE_PROVIDER_SITE_OTHER): Payer: Self-pay

## 2016-09-22 ENCOUNTER — Other Ambulatory Visit: Payer: Self-pay | Admitting: *Deleted

## 2016-09-22 NOTE — Progress Notes (Signed)
[name] [name], [name] [name] ([id number]) Visit Report for 09/19/2016 Arrival Information Details [name] [name] 09/19/2016 1:30 Patient Name: Date of Service: C. PM Medical Record Patient Account Number: [id number] Number: Treating RN: Montey Hora Date of Birth/Sex: 08/21/1931 (83 y.o. Female) Other Clinician: Primary Care Wong Steadham: LAM, LYNN Treating STONE III, HOYT Referring Lerone Onder: LAM, LYNN Dinesh Ulysse/Extender: Weeks in Treatment: 8 Visit Information History Since Last Visit Added or deleted any medications: No Patient Arrived: Wheel Chair Any new allergies or adverse reactions: No Arrival Time: 13:41 Had a fall or experienced change in No activities of daily living that may affect Accompanied By: spouse risk of falls: Transfer Assistance: Manual Signs or symptoms of abuse/neglect since last No Patient Identification Verified: Yes visito Secondary Verification Process Yes Hospitalized since last visit: No Completed: Has Dressing in Place as Prescribed: Yes Patient Requires Transmission-Based No Pain Present Now: No Precautions: Patient Has Alerts: No Electronic Signature(s) Signed: 09/19/2016 4:51:44 PM By: Montey Hora Entered By: Montey Hora on 09/19/2016 13:42:13 [name] [name] ([id number]) -------------------------------------------------------------------------------- Clinic Level of Care Assessment Details [name], Evolet 09/19/2016 1:30 Patient Name: Date of Service: C. PM Medical Record Patient Account Number: [id number] Number: Treating RN: Montey Hora Date of Birth/Sex: 31-Jan-1932 (83 y.o. Female) Other Clinician: Primary Care Qunicy Higinbotham: LAM, LYNN Treating STONE III, HOYT Referring Janita Camberos: LAM, LYNN Cola Gane/Extender: Weeks in Treatment: 8 Clinic Level of Care Assessment Items TOOL 4 Quantity Score []  - Use when only an EandM is performed on FOLLOW-UP visit 0 ASSESSMENTS - Nursing Assessment /  Reassessment X - Reassessment of Co-morbidities (includes updates in patient status) 1 10 X - Reassessment of Adherence to Treatment Plan 1 5 ASSESSMENTS - Wound and Skin Assessment / Reassessment []  - Simple Wound Assessment / Reassessment - one wound 0 X - Complex Wound Assessment / Reassessment - multiple wounds 2 5 []  - Dermatologic / Skin Assessment (not related to wound area) 0 ASSESSMENTS - Focused Assessment []  - Circumferential Edema Measurements - multi extremities 0 []  - Nutritional Assessment / Counseling / Intervention 0 X - Lower Extremity Assessment (monofilament, tuning fork, pulses) 1 5 X - Peripheral Arterial Disease Assessment (using hand held doppler) 1 10 ASSESSMENTS - Ostomy and/or Continence Assessment and Care []  - Incontinence Assessment and Management 0 []  - Ostomy Care Assessment and Management (repouching, etc.) 0 PROCESS - Coordination of Care X - Simple Patient / Family Education for ongoing care 1 15 []  - Complex (extensive) Patient / Family Education for ongoing care 0 []  - Staff obtains Programmer, systems, Records, Test Results / Process Orders 0 []  - Staff telephones HHA, Nursing Homes / Clarify orders / etc 0 [name], AMANAT HACKEL. ([id number]) []  - Routine Transfer to another Facility (non-emergent condition) 0 []  - Routine Hospital Admission (non-emergent condition) 0 []  - New Admissions / Biomedical engineer / Ordering NPWT, Apligraf, etc. 0 []  - Emergency Hospital Admission (emergent condition) 0 X - Simple Discharge Coordination 1 10 []  - Complex (extensive) Discharge Coordination 0 PROCESS - Special Needs []  - Pediatric / Minor Patient Management 0 []  - Isolation Patient Management 0 []  - Hearing / Language / Visual special needs 0 []  - Assessment of Community assistance (transportation, D/C planning, etc.) 0 []  - Additional assistance / Altered mentation 0 []  - Support Surface(s) Assessment (bed, cushion, seat, etc.) 0 INTERVENTIONS - Wound  Cleansing / Measurement []  - Simple Wound Cleansing - one wound 0 X - Complex Wound Cleansing - multiple wounds 2 5 X - Wound Imaging (photographs - any number of wounds)  1 5 []  - Wound Tracing (instead of photographs) 0 []  - Simple Wound Measurement - one wound 0 X - Complex Wound Measurement - multiple wounds 2 5 INTERVENTIONS - Wound Dressings X - Small Wound Dressing one or multiple wounds 2 10 []  - Medium Wound Dressing one or multiple wounds 0 []  - Large Wound Dressing one or multiple wounds 0 []  - Application of Medications - topical 0 []  - Application of Medications - injection 0 [name], Dhana C. ([id number]) INTERVENTIONS - Miscellaneous []  - External ear exam 0 []  - Specimen Collection (cultures, biopsies, blood, body fluids, etc.) 0 []  - Specimen(s) / Culture(s) sent or taken to Lab for analysis 0 X - Patient Transfer (multiple staff / Harrel Lemon Lift / Similar devices) 1 10 []  - Simple Staple / Suture removal (25 or less) 0 []  - Complex Staple / Suture removal (26 or more) 0 []  - Hypo / Hyperglycemic Management (close monitor of Blood Glucose) 0 []  - Ankle / Brachial Index (ABI) - do not check if billed separately 0 X - Vital Signs 1 5 Has the patient been seen at the hospital within the last three years: Yes Total Score: 125 Level Of Care: New/Established - Level 4 Electronic Signature(s) Signed: 09/19/2016 4:51:44 PM By: Montey Hora Entered By: Montey Hora on 09/19/2016 14:45:25 [name] [name] ([id number]) -------------------------------------------------------------------------------- Encounter Discharge Information Details [name] [name] 09/19/2016 1:30 Patient Name: Date of Service: C. PM Medical Record Patient Account Number: [id number] Number: Treating RN: Montey Hora Date of Birth/Sex: 04/01/1931 (83 y.o. Female) Other Clinician: Primary Care Jesenia Spera: LAM, LYNN Treating STONE III, HOYT Referring Leiam Hopwood: LAM,  LYNN Nannie Starzyk/Extender: Weeks in Treatment: 8 Encounter Discharge Information Items Discharge Pain Level: 0 Discharge Condition: Stable Ambulatory Status: Wheelchair Discharge Destination: Home Transportation: Private Auto Accompanied By: dtr Schedule Follow-up Appointment: Yes Medication Reconciliation completed and provided to Patient/Care No Illene Sweeting: Provided on Clinical Summary of Care: 09/19/2016 Form Type Recipient Paper Patient JMS Electronic Signature(s) Signed: 09/21/2016 10:14:57 AM By: Ruthine Dose Entered By: Ruthine Dose on 09/19/2016 14:21:29 [name] [name] ([id number]) -------------------------------------------------------------------------------- Lower Extremity Assessment Details [name], Harlene 09/19/2016 1:30 Patient Name: Date of Service: C. PM Medical Record Patient Account Number: [id number] Number: Treating RN: Montey Hora Date of Birth/Sex: [birth year] (83 y.o. Female) Other Clinician: Primary Care Malaijah Houchen: LAM, LYNN Treating STONE III, HOYT Referring Jahzier Villalon: LAM, LYNN Cici Rodriges/Extender: Weeks in Treatment: 8 Vascular Assessment Pulses: Dorsalis Pedis Palpable: [Right:No] Doppler Audible: [Right:Yes] Posterior Tibial Extremity colors, hair growth, and conditions: Extremity Color: [Right:Normal] Hair Growth on Extremity: [Right:No] Temperature of Extremity: [Right:Warm] Capillary Refill: [Right:< 3 seconds] Toe Nail Assessment Left: Right: Thick: Yes Discolored: Yes Deformed: No Improper Length and Hygiene: Yes Electronic Signature(s) Signed: 09/19/2016 4:51:44 PM By: Montey Hora Entered By: Montey Hora on 09/19/2016 13:54:05 [name] [name] ([id number]) -------------------------------------------------------------------------------- Multi Wound Chart Details [name], Nysa 09/19/2016 1:30 Patient Name: Date of Service: C. PM Medical Record Patient Account Number:  [id number] Number: Treating RN: Montey Hora Date of Birth/Sex: 1931/08/25 (83 y.o. 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009] [3:0.003] [N/A:N/A] % Reduction in Area: [1:97.40%] [3:89.80%] [N/A:N/A] % Reduction in Volume: 97.50% [3:97.90%] [N/A:N/A] Classification: [1:Grade 1] [3:Grade 2] [N/A:N/A] Exudate Amount: [1:Large] [3:Large] [N/A:N/A] Exudate Type: [1:Serous] [3:Serous] [N/A:N/A] Exudate Color: [1:amber] [3:amber] [N/A:N/A] Foul Odor After [1:Yes] [3:No] [N/A:N/A] Cleansing: Odor Anticipated Due to No [3:N/A] [N/A:N/A] Product Use: [name], Alashia C. ([id number]) Wound Margin: Distinct, outline attached Flat and Intact N/A Granulation Amount: Large (67-100%) Medium (34-66%) N/A Granulation Quality: Pale  Red N/A Necrotic Amount: Small (1-33%) Medium (34-66%) N/A Exposed Structures: Fat Layer (Subcutaneous Fat Layer (Subcutaneous N/A Tissue) Exposed: Yes Tissue) Exposed: Yes Fascia: No Fascia: No Tendon: No Tendon: No Muscle: No Muscle: No Joint: No Joint: No Bone: No Bone: No Epithelialization: Medium (34-66%) None N/A Periwound Skin Texture: Excoriation: No No Abnormalities Noted N/A Induration: No Callus: No Crepitus: No Rash: No Scarring: No Periwound Skin Maceration: No Maceration: Yes N/A Moisture: Dry/Scaly: No Periwound Skin Color: Atrophie Blanche: No No Abnormalities Noted N/A Cyanosis: No Ecchymosis: No Erythema: No Hemosiderin Staining: No Mottled: No Pallor: No Rubor: No Temperature: No Abnormality No Abnormality N/A Tenderness on No Yes N/A Palpation: Wound Preparation: Ulcer Cleansing: Ulcer Cleansing: N/A Rinsed/Irrigated with Rinsed/Irrigated with Saline Saline Topical Anesthetic Topical Anesthetic Applied: Other: lidocaine Applied: Other: lidocaine 4% 4% Treatment Notes Electronic Signature(s) Signed: 09/19/2016 4:51:44 PM By: Montey Hora Entered By: Montey Hora on 09/19/2016 14:04:03 [name] [name] ([id number]) -------------------------------------------------------------------------------- Pierce Details [name], Margarett 09/19/2016 1:30 Patient Name: Date of Service: C. PM Medical Record Patient Account Number: [id number] Number: Treating RN: Montey Hora Date of Birth/Sex: September 20, 1931 (83 y.o. Female) Other Clinician: Primary Care Kiyani Jernigan: LAM, LYNN Treating STONE III, HOYT Referring Hudson Lehmkuhl: LAM, LYNN Kyrell Ruacho/Extender: Weeks in Treatment: 8 Active Inactive ` Orientation to the Wound Care Program Nursing Diagnoses: Knowledge deficit related to the wound healing center program Goals: Patient/caregiver will verbalize understanding of the [place] Date  Initiated: 07/26/2016 Target Resolution Date: 07/31/2016 Goal Status: Active Interventions: Provide education on orientation to the wound center Notes: ` Soft Tissue Infection Nursing Diagnoses: Impaired tissue integrity Goals: Signs and symptoms of infection will be recognized early to allow for prompt treatment Date Initiated: 07/26/2016 Target Resolution Date: 07/31/2016 Goal Status: Active Interventions: Assess signs and symptoms of infection every visit Notes: ` Wound/Skin Impairment Nursing Diagnoses: MEKESHA, SOLOMON ([id number]) Impaired tissue integrity Goals: Ulcer/skin breakdown will heal within 14 weeks Date Initiated: 07/26/2016 Target Resolution Date: 10/30/2016 Goal Status: Active Interventions: Assess patient/caregiver ability to perform ulcer/skin care regimen upon admission and as needed Treatment Activities: Skin care regimen initiated : 07/26/2016 Notes: Electronic Signature(s) Signed: 09/19/2016 4:51:44 PM By: Montey Hora Entered By: Montey Hora on 09/19/2016 14:03:42 [name] [name] ([id number]) -------------------------------------------------------------------------------- Pain Assessment Details [name], Mylee 09/19/2016 1:30 Patient Name: Date of Service: C. PM Medical Record Patient Account Number: [id number] Number: Treating RN: Montey Hora Date of Birth/Sex: 22-Sep-1931 (83 y.o. Female) Other Clinician: Primary Care Holle Sprick: LAM, LYNN Treating STONE III, HOYT Referring Detavious Rinn: LAM, LYNN Jakiah Bienaime/Extender: Weeks in Treatment: 8 Active Problems Location of Pain Severity and Description of Pain Patient Has Paino No Site Locations  Pain Management and Medication Current Pain Management: Notes Topical or injectable lidocaine is offered to patient for acute pain when surgical debridement is performed. If needed, Patient is instructed to use over the counter pain medication for the following 24-48 hours  after debridement. Wound care MDs do not prescribed pain medications. Patient has chronic pain or uncontrolled pain. Patient has been instructed to make an appointment with their Primary Care Physician for pain management. Electronic Signature(s) Signed: 09/19/2016 4:51:44 PM By: Montey Hora Entered By: Montey Hora on 09/19/2016 13:42:20 [name] [name] ([id number]) -------------------------------------------------------------------------------- Patient/Caregiver Education Details [name], Shacoria 09/19/2016 1:30 Patient Name: Date of Service: C. PM Medical Record Patient Account Number: [id number] Number: Treating RN: Montey Hora Date of Birth/Gender: 04/10/1931 (83 y.o. Female) Other Clinician: Primary Care Physician: LAM, LYNN Treating STONE III, HOYT Referring Physician: LAM, LYNN Physician/Extender: Weeks in Treatment: 8 Education Assessment Education Provided To: Patient and Caregiver Education Topics Provided Wound/Skin Impairment: Handouts: Other: wound care as ordered Methods: Demonstration, Explain/Verbal Responses: State content correctly Electronic Signature(s) Signed: 09/19/2016 4:51:44 PM By: Montey Hora Entered By: Montey Hora on 09/19/2016 14:04:49 [name] [name] ([id number]) -------------------------------------------------------------------------------- Wound Assessment Details [name], Kenady 09/19/2016 1:30 Patient Name: Date of Service: C. PM Medical Record Patient Account Number: [id number] Number: Treating RN: Montey Hora Date of Birth/Sex: 06/21/1931 (83 y.o. Female) Other Clinician: Primary Care Melda Mermelstein: LAM, LYNN Treating STONE III, HOYT Referring Glendoris Nodarse: LAM, LYNN Drea Jurewicz/Extender: Weeks in Treatment: 8 Wound Status Wound Number: 1 Primary Diabetic Wound/Ulcer of the Lower Etiology: Extremity Wound Location: Right Calcaneus Wound Open Wounding Event: Gradually  Appeared Status: Date Acquired: 06/13/2016 Comorbid Cataracts, Arrhythmia, Coronary Artery Weeks Of Treatment: 8 History: Disease, Hypertension, Type II Clustered Wound: No Diabetes, History of pressure wounds, Osteoarthritis, Neuropathy Photos Photo Uploaded By: Montey Hora on 09/19/2016 16:35:30 Wound Measurements Length: (cm) 0.4 Width: (cm) 0.3 Depth: (cm) 0.1 Area: (cm) 0.094 Volume: (cm) 0.009 % Reduction in Area: 97.4% % Reduction in Volume: 97.5% Epithelialization: Medium (34-66%) Tunneling: No Undermining: No Wound Description Classification: Grade 1 Wound Margin: Distinct, outline attached Exudate Amount: Large Exudate Type: Serous Exudate Color: amber Foul Odor After Cleansing: Yes Due to Product Use: No Slough/Fibrino Yes Wound Bed Granulation Amount: Large (67-100%) Exposed Structure [name], Tawny C. ([id number]) Granulation Quality: Pale Fascia Exposed: No Necrotic Amount: Small (1-33%) Fat Layer (Subcutaneous Tissue) Exposed: Yes Necrotic Quality: Adherent Slough Tendon Exposed: No Muscle Exposed: No Joint Exposed: No Bone Exposed: No Periwound Skin Texture Texture Color No Abnormalities Noted: No No Abnormalities Noted: No Callus: No Atrophie Blanche: No Crepitus: No Cyanosis: No Excoriation: No Ecchymosis: No Induration: No Erythema: No Rash: No Hemosiderin Staining: No Scarring: No Mottled: No Pallor: No Moisture Rubor: No No Abnormalities Noted: No Dry / Scaly: No Temperature / Pain Maceration: No Temperature: No Abnormality Wound Preparation Ulcer Cleansing: Rinsed/Irrigated with Saline Topical Anesthetic Applied: Other: lidocaine 4%, Treatment Notes Wound #1 (Right Calcaneus) 1. Cleansed with: Clean wound with Normal Saline 2. Anesthetic Topical Lidocaine 4% cream to wound bed prior to debridement 4. Dressing Applied: Prisma Ag 5. Secondary Dressing Applied Foam Gauze and Kerlix/Conform 7. Secured  with Tape Notes heel cup Electronic Signature(s) Signed: 09/19/2016 4:51:44 PM By: Montey Hora Entered By: Montey Hora on 09/19/2016 13:50:45 [name] [name] ([id number]) -------------------------------------------------------------------------------- Wound Assessment Details [name], Manju 09/19/2016 1:30 Patient Name: Date of Service: C. PM Medical Record Patient Account Number: [id number] Number: Treating RN: Montey Hora Date of Birth/Sex: February 23, 1931 (83 y.o. Female) Other Clinician: Primary Care Jameis Newsham: LAM, Jeani Hawking  Treating STONE III, HOYT Referring Mclain Freer: LAM, LYNN Latonja Bobeck/Extender: Weeks in Treatment: 8 Wound Status Wound Number: 3 Primary Diabetic Wound/Ulcer of the Lower Etiology: Extremity Wound Location: Right Toe Fifth - Medial Wound Open Wounding Event: Pressure Injury Status: Date Acquired: 07/10/2016 Comorbid Cataracts, Arrhythmia, Coronary Artery Weeks Of Treatment: 6 History: Disease, Hypertension, Type II Clustered Wound: No Diabetes, History of pressure wounds, Osteoarthritis, Neuropathy Photos Photo Uploaded By: Montey Hora on 09/19/2016 16:35:31 Wound Measurements Length: (cm) 0.2 Width: (cm) 0.1 Depth: (cm) 0.2 Area: (cm) 0.016 Volume: (cm) 0.003 % Reduction in Area: 89.8% % Reduction in Volume: 97.9% Epithelialization: None Tunneling: No Undermining: No Wound Description Classification: Grade 2 Wound Margin: Flat and Intact Exudate Amount: Large Exudate Type: Serous Exudate Color: amber Foul Odor After Cleansing: No Slough/Fibrino Yes Wound Bed Granulation Amount: Medium (34-66%) Exposed Structure [name], Jazyiah C. ([id number]) Granulation Quality: Red Fascia Exposed: No Necrotic Amount: Medium (34-66%) Fat Layer (Subcutaneous Tissue) Exposed: Yes Necrotic Quality: Adherent Slough Tendon Exposed: No Muscle Exposed: No Joint Exposed: No Bone Exposed: No Periwound Skin  Texture Texture Color No Abnormalities Noted: No No Abnormalities Noted: No Moisture Temperature / Pain No Abnormalities Noted: No Temperature: No Abnormality Maceration: Yes Tenderness on Palpation: Yes Wound Preparation Ulcer Cleansing: Rinsed/Irrigated with Saline Topical Anesthetic Applied: Other: lidocaine 4%, Treatment Notes Wound #3 (Right, Medial Toe Fifth) 1. Cleansed with: Clean wound with Normal Saline 2. Anesthetic Topical Lidocaine 4% cream to wound bed prior to debridement 4. Dressing Applied: Aquacel Ag Notes coverlet band-aide Electronic Signature(s) Signed: 09/19/2016 4:51:44 PM By: Montey Hora Entered By: Montey Hora on 09/19/2016 13:51:01 [name] [name] ([id number]) -------------------------------------------------------------------------------- Vitals Details [name], Marice 09/19/2016 1:30 Patient Name: Date of Service: C. PM Medical Record Patient Account Number: [id number] Number: Treating RN: Montey Hora Date of Birth/Sex: 1931-07-17 (83 y.o. Female) Other Clinician: Primary Care Dvontae Ruan: LAM, LYNN Treating STONE III, HOYT Referring Willow Reczek: LAM, LYNN Donavin Audino/Extender: Weeks in Treatment: 8 Vital Signs Time Taken: 13:45 Temperature (F): 98.3 Height (in): 63 Pulse (bpm): 64 Weight (lbs): 203 Respiratory Rate (breaths/min): 20 Body Mass Index (BMI): 36 Blood Pressure (mmHg): 149/87 Reference Range: 80 - 120 mg / dl Electronic Signature(s) Signed: 09/19/2016 4:51:44 PM By: Montey Hora Entered By: Montey Hora on 09/19/2016 13:46:36

## 2016-09-22 NOTE — Patient Outreach (Signed)
[facility]) Care Management  09/22/2016  [name] [name] 1931/11/11 [id number]    CSW was able to make contact with PCP's office and request FL2 be faxed to [facility] office.  CSW has also updated [facility] SW of this and awaiting callback for update on SNF search. [facility] CSW has communicated with several are SNF's and will assist with getting FL2 to them for review.   CSW will update at progress is made.     [facility] CM Care Plan Problem One     Most Recent Value  Care Plan Problem One  (P) Patient needing higher level of care than at home.  Role Documenting the Problem One  (P) Clinical Social Worker  Care Plan for Problem One  (P) Active  [facility] Long Term Goal   (P) Patient will be placed in a facility in the next 90days.  [facility] Long Term Goal Start Date  (P) 09/15/16  [facility] Long Term Goal Met Date  (P) 08/30/16  Interventions for Problem One Long Term Goal  (P) CSW assisting and coordianating placement with patient, family, PCP and [place] SW.   [facility] CM Short Term Goal #1   (P)    [facility] CM Short Term Goal #1 Start Date  (P) 08/30/16  [facility] CM Short Term Goal #1 Met Date  (P) 07/25/16  Interventions for Short Term Goal #1  (P)    [facility] CM Short Term Goal #2   (P)    [facility] CM Short Term Goal #2 Start Date  (P) 08/30/16  [facility] CM Short Term Goal #2 Met Date  (P) 04/05/16  Interventions for Short Term Goal #2  (P)    [facility] CM Short Term Goal #3  (P)    [facility] CM Short Term Goal #3 Start Date  (P) 09/07/16  [facility] CM Short Term Goal #3 Met Date  (P) 08/15/16  Interventions for Short Tern Goal #3  (P)    [facility] CM Short Term Goal #4  (P)    [facility] CM Short Term Goal #4 Start Date  (P) 08/30/16  [facility] CM Short Term Goal #4 Met Date  (P) 03/27/16  Interventions for Short Term Goal #4  (P)    [facility] CM Short Term Goal #5   (P) Patient will reports increased knowledge of healthy food choices in the next 30 day s  Interventions for Short Term Goal #5  (P) Explained importance of limiting salt in food, to decrease  swelling, not adding salt to foods.     [facility] CM Care Plan Problem Two     Most Recent Value  Care Plan Problem Two  (P) High fall risk   Role Documenting the Problem Two  (P) Care Management Coordinator  Care Plan for Problem Two  (P) Not Active  [facility] CM Short Term Goal #1   (P) Patient will not experience a fall in the next 30 days   [facility] CM Short Term Goal #1 Met Date   (P) 01/07/16  Interventions for Short Term Goal #2   (P) Reinforced fall prevention strategies   [facility] CM Short Term Goal #2   (P) Patient will have medical alert system in place in the next 30 days  Interventions for Short Term Goal #2  (P) Assisted patient with making call to [employer] volunteer services for alert system   [facility] CM Short Term Goal #3   (P) Patient will report doing walking in place exercises at least 5 minutes , 5 times a  week in the next 30 days   Interventions for Short Term Goal #3  (P) Discussed use of her foot pedals exercise, reinforced  to continue leg lift exercises while in bed        [name], MSW, [place] Worker  [place] [phone]

## 2016-09-22 NOTE — Patient Outreach (Signed)
[facility]) Care Management  [place]  09/22/2016   [name] [name] August 19, 1931 [id number]  Subjective:  Patient states I can't get it figured out how to use this meter. Patient reports she continues to take her insulin twice daily.   Objective: BP (!) 142/72 (BP Location: Left Arm, Patient Position: Sitting, Cuff Size: Normal)   Pulse 62   Resp 18   SpO2 98%   Encounter Medications:  Outpatient Encounter Prescriptions as of 09/22/2016  Medication Sig Note  . ACCU-CHEK SOFTCLIX LANCETS lancets CHECK BLOOD SUGARS TWICE DAILY   . acetaminophen (TYLENOL) 325 MG tablet Take 650 mg by mouth every 6 (six) hours as needed for mild pain.   [name] aspirin 81 MG tablet Take 81 mg by mouth daily.   . bisacodyl (DULCOLAX) 5 MG EC tablet Take 5 mg by mouth daily as needed for moderate constipation.   . carvedilol (COREG) 6.25 MG tablet Take 6.25 mg by mouth 2 (two) times daily with a meal.   . furosemide (LASIX) 40 MG tablet Take 80 mg by mouth 2 (two) times daily.   . insulin aspart (NOVOLOG FLEXPEN) 100 UNIT/ML FlexPen Inject into the skin 3 (three) times daily with meals. Sliding Scale 2 units-6 units patient has list for 100-400 BS readings  100-200, 2 units, 201-250, 4 units, 251--300 6 units, 301-350, 8 units, 351 to 400 10 units. 08/02/2016: Has on hand if needed  . Insulin NPH Isophane & Regular (NOVOLIN 70/30 [place]) Inject 40-50 Units into the skin. 50 units every morning  50 units every evening 08/02/2016: Taking 56 units in the morning and evening   . Insulin Pen Needle (RELION SHORT PEN NEEDLES) 31G X 8 MM MISC 1 each by Does not apply route 2 (two) times daily.   [name] lisinopril (PRINIVIL,ZESTRIL) 10 MG tablet Take 10 mg by mouth daily.   [name] Glycol-Propyl Glycol (SYSTANE) 0.4-0.3 % SOLN Place 1 drop into both eyes daily as needed (dry eyes).   . polyethylene glycol (MIRALAX / GLYCOLAX) packet USE 1-2 PACKETS DAILY   . pravastatin (PRAVACHOL) 40 MG tablet  TAKE 1 TABLET BY MOUTH AT BEDTIME   . ranitidine (ZANTAC) 150 MG tablet Take 150 mg by mouth 2 (two) times daily.    No facility-administered encounter medications on file as of 09/22/2016.     Functional Status:  In your present state of health, do you have any difficulty performing the following activities: 09/07/2016 08/16/2016  Hearing? N N  Vision? - N  Difficulty concentrating or making decisions? Y N  Comment family assist  -  Walking or climbing stairs? Y N  Dressing or bathing? Y N  Comment has personal care providers -  Doing errands, shopping? Y -  Conservation officer, nature and eating ? Y -  Using the Toilet? Y -  In the past six months, have you accidently leaked urine? Y -  Comment wears depends -  Do you have problems with loss of bowel control? N -  Managing your Medications? Y -  Managing your Finances? Y -  Comment family helps -  Housekeeping or managing your Housekeeping? Y -  Comment has personal care help -  Some recent data might be hidden    Fall/Depression Screening: Fall Risk  09/07/2016 06/21/2016 05/26/2016  Falls in the past year? Yes Yes No  Number falls in past yr: 1 1 -  Injury with Fall? No No -  Risk for fall due to : History of  fall(s) Impaired balance/gait;Impaired mobility Impaired vision;Impaired mobility;Impaired balance/gait  Follow up Falls evaluation completed;Falls prevention discussed Falls evaluation completed;Falls prevention discussed;Education provided -   PHQ 2/9 Scores 09/07/2016 06/21/2016 05/26/2016 04/05/2016 12/28/2015 11/22/2015 11/18/2015  PHQ - 2 Score 1 1 1 1  0 1 1    Assessment:  Diabetes - assisted with setting up new meter, patient able to perform cbg check with new meter, but states she doesn't like it as well, the strips are smaller and harder for her to hold onto. She requested I check out her old meter to see if I can get it to work she said her daughter has been able to get it to work. Accu arriva meter worked without problems,  patient will continue to use this meter, she has strips for machine. Noted patient drinking regular soda at beginning of visit when checked blood sugar 300. Reviewed education on how this will cause increase in blood sugar, suggested diet soda, patient states she only drinks one a day, usually she drinks water.    Right foot wound. Patient anticipating home health RN for visit on today for dressing change, dressing not changed at visit. Placed call to [facility] to verify home health care visit for wound care planned today, she verified and will have nurse call patient with anticipated time. Has dressing to right toe and right heel.  Patient with swelling to lower legs reinforced keeping legs elevated during the day and limiting salty foods. Patient discussed having meal on wheel for dinner eat day and not adding salt.   Mobility  Patient reports she believes she is getting a little stronger, but states she might get better with more therapy. Patient states she is not sure whether to go to rehab now or later on discussing her upcoming stent procedure.   Patient report taking daily medications, from blister packaging.   Incoming call from patient daughter prior to home visit,she discussed she had left a message with nurse at [facility] office, requesting that since bleeding has not increased, and patient only notices more blood on diaper in the morning after having diaper on more than 10 hours and also urinated in diaper overnight, she plans to keep scheduled appointment for 8/28.    Plan:  Will follow up with patient in the next week by telephnone Patient will continue to check blood sugar twice daily and take insulin as prescribed. Will follow up with therapy regarding patient progress at home.  Will update [name] LCSW, [facility] of today's visit.   [name], RN, [place] Management Coordinator  [phone]- Mobile [phone]- Toll Free Main Office

## 2016-09-25 ENCOUNTER — Encounter (INDEPENDENT_AMBULATORY_CARE_PROVIDER_SITE_OTHER): Payer: Self-pay

## 2016-09-25 ENCOUNTER — Other Ambulatory Visit: Payer: Self-pay | Admitting: *Deleted

## 2016-09-25 NOTE — Patient Outreach (Signed)
[place] [facility] [facility]) Care Management  09/25/2016  [name] [name] 1931-04-04 [id number]   Incoming call from patient, she voiced concern regarding not sure when home health RN will visit to change dressing to right foot, she also voiced concern regarding discomfort at left foot on last night.  Patient discussed she received a call on 8/17 from home health agency stating they would not be able to come over the weekend and asked if someone in the  home would be able to change dressing. Patient states she had someone to help her with wrapping wound but dressing was not staying on well. Placed call to [facility] , representative reports according to scheduler patient has  home health RN visit for 8/21, also home health physical therapy visit for 8/23.  Patient reports feeling pretty good on today, she has been able to sit outside on porch in her wheel chair on today.  Plan  Will follow up with patient later in week to regarding home health visit.  Social worker still following to [place] [facility] rehab, anticipate home physical evaluation of progress this week.    [name], RN, [place] Management Coordinator  [phone]- Mobile [phone]- Toll Free Main Office

## 2016-09-26 ENCOUNTER — Other Ambulatory Visit: Payer: Self-pay | Admitting: *Deleted

## 2016-09-26 DIAGNOSIS — N183 Chronic kidney disease, stage 3 (moderate): Secondary | ICD-10-CM | POA: Diagnosis not present

## 2016-09-26 DIAGNOSIS — I5042 Chronic combined systolic (congestive) and diastolic (congestive) heart failure: Secondary | ICD-10-CM | POA: Diagnosis not present

## 2016-09-26 DIAGNOSIS — S91114D Laceration without foreign body of right lesser toe(s) without damage to nail, subsequent encounter: Secondary | ICD-10-CM | POA: Diagnosis not present

## 2016-09-26 DIAGNOSIS — M6281 Muscle weakness (generalized): Secondary | ICD-10-CM | POA: Diagnosis not present

## 2016-09-26 DIAGNOSIS — E1151 Type 2 diabetes mellitus with diabetic peripheral angiopathy without gangrene: Secondary | ICD-10-CM | POA: Diagnosis not present

## 2016-09-26 DIAGNOSIS — L89612 Pressure ulcer of right heel, stage 2: Secondary | ICD-10-CM | POA: Diagnosis not present

## 2016-09-26 DIAGNOSIS — I69398 Other sequelae of cerebral infarction: Secondary | ICD-10-CM | POA: Diagnosis not present

## 2016-09-26 DIAGNOSIS — I110 Hypertensive heart disease with heart failure: Secondary | ICD-10-CM | POA: Diagnosis not present

## 2016-09-26 DIAGNOSIS — E1122 Type 2 diabetes mellitus with diabetic chronic kidney disease: Secondary | ICD-10-CM | POA: Diagnosis not present

## 2016-09-26 NOTE — Patient Outreach (Signed)
[place] [facility]) Care Management  09/26/2016  SHAELEIGH GRAW Mar 17, 1931 [id number]   Incoming call  Received return call from [place] from [facility] home health, regarding follow up on patient home health RN visit for wound care . [name] verifies patient for RN and physical therapy visit on today as well as Thursday.    Plan Will follow up with patient in the next 3 days.  [name], RN, [place] Management Coordinator  [phone]- Mobile [phone]- Toll Free Main Office

## 2016-09-26 NOTE — Patient Outreach (Signed)
[place] [facility]) Care Management  09/26/2016  [name] [name] 1931/02/28 [id number]   CSW spoke with Sharyn Lull, SW with [facility] agency who reports they are having HHPT do another evaluation at home to submit to patient's Insurance for hopeful SNF authorization.  CSW awaits follow up from [facility] SW on PT and [place].  [name], MSW, [place] Worker  [place] [phone]

## 2016-09-27 ENCOUNTER — Other Ambulatory Visit: Payer: Self-pay | Admitting: *Deleted

## 2016-09-27 ENCOUNTER — Encounter (INDEPENDENT_AMBULATORY_CARE_PROVIDER_SITE_OTHER): Payer: Self-pay

## 2016-09-27 NOTE — Patient Outreach (Signed)
[facility]) Care Management  09/27/2016  [name] [name] Mar 31, 1931 [id number]   Incoming voicemail message from patient daughter, [name], requesting a return call. Returned call to daughter, she had questions regarding   [employer] rides , she has been arranging, one way rides to patient appointments,as she meets patient at appointments and provides return transportation , she understood she had 12 rides this way she was questioning the number of rides she had left. She has spoken with transportation services but was not clear on how many she had left, they referred her to call customer service.   Daughter discussed she has made transportation appointment for wound clinic visit on next week as well as  Stent preprocedure and procedure day transportation , [name] discussed she is the one in the family that arranges all follow up appointment and transportation.   Plan Will assist by placing phone call to [facility] transportation , verified patient has 1, round trip left or 2 one way trips to equal benefit to 6 round trips or 12 one way trips/year. Will follow up with [name] within the next few days to clarify.   [name], RN, [place] Management Coordinator  [phone]- Mobile [phone]- Toll Free Main Office

## 2016-09-27 NOTE — Patient Outreach (Signed)
[facility]) Care Management  09/27/2016  TESS POTTS [birth year] [id number]   CSW has received word from the [place] that a PT/OT eval has been completed and submitted to insurance for approval. A SNF bed has also been secured for her pending Insurance authorization.  Patient and family aware and await word as well.  CSW will seek updates and assist with plans for hopeful SNF approved admit.   [name], MSW, [place] Worker  [place] [phone]

## 2016-09-28 ENCOUNTER — Other Ambulatory Visit: Payer: Self-pay | Admitting: *Deleted

## 2016-09-28 DIAGNOSIS — I110 Hypertensive heart disease with heart failure: Secondary | ICD-10-CM | POA: Diagnosis not present

## 2016-09-28 DIAGNOSIS — R54 Age-related physical debility: Secondary | ICD-10-CM | POA: Diagnosis not present

## 2016-09-28 DIAGNOSIS — N183 Chronic kidney disease, stage 3 (moderate): Secondary | ICD-10-CM | POA: Diagnosis not present

## 2016-09-28 DIAGNOSIS — I509 Heart failure, unspecified: Secondary | ICD-10-CM | POA: Diagnosis not present

## 2016-09-28 DIAGNOSIS — E113593 Type 2 diabetes mellitus with proliferative diabetic retinopathy without macular edema, bilateral: Secondary | ICD-10-CM | POA: Diagnosis not present

## 2016-09-28 DIAGNOSIS — Z136 Encounter for screening for cardiovascular disorders: Secondary | ICD-10-CM | POA: Diagnosis not present

## 2016-09-28 DIAGNOSIS — S91114D Laceration without foreign body of right lesser toe(s) without damage to nail, subsequent encounter: Secondary | ICD-10-CM | POA: Diagnosis not present

## 2016-09-28 DIAGNOSIS — I2510 Atherosclerotic heart disease of native coronary artery without angina pectoris: Secondary | ICD-10-CM | POA: Diagnosis not present

## 2016-09-28 DIAGNOSIS — E1122 Type 2 diabetes mellitus with diabetic chronic kidney disease: Secondary | ICD-10-CM | POA: Diagnosis not present

## 2016-09-28 DIAGNOSIS — I69398 Other sequelae of cerebral infarction: Secondary | ICD-10-CM | POA: Diagnosis not present

## 2016-09-28 DIAGNOSIS — E11621 Type 2 diabetes mellitus with foot ulcer: Secondary | ICD-10-CM | POA: Diagnosis not present

## 2016-09-28 DIAGNOSIS — I10 Essential (primary) hypertension: Secondary | ICD-10-CM | POA: Diagnosis not present

## 2016-09-28 DIAGNOSIS — Z0000 Encounter for general adult medical examination without abnormal findings: Secondary | ICD-10-CM | POA: Diagnosis not present

## 2016-09-28 DIAGNOSIS — I5042 Chronic combined systolic (congestive) and diastolic (congestive) heart failure: Secondary | ICD-10-CM | POA: Diagnosis not present

## 2016-09-28 DIAGNOSIS — M1990 Unspecified osteoarthritis, unspecified site: Secondary | ICD-10-CM | POA: Diagnosis not present

## 2016-09-28 DIAGNOSIS — S91301A Unspecified open wound, right foot, initial encounter: Secondary | ICD-10-CM | POA: Diagnosis not present

## 2016-09-28 DIAGNOSIS — E1151 Type 2 diabetes mellitus with diabetic peripheral angiopathy without gangrene: Secondary | ICD-10-CM | POA: Diagnosis not present

## 2016-09-28 DIAGNOSIS — L89612 Pressure ulcer of right heel, stage 2: Secondary | ICD-10-CM | POA: Diagnosis not present

## 2016-09-28 DIAGNOSIS — I639 Cerebral infarction, unspecified: Secondary | ICD-10-CM | POA: Diagnosis not present

## 2016-09-28 DIAGNOSIS — Z1389 Encounter for screening for other disorder: Secondary | ICD-10-CM | POA: Diagnosis not present

## 2016-09-28 DIAGNOSIS — C541 Malignant neoplasm of endometrium: Secondary | ICD-10-CM | POA: Diagnosis not present

## 2016-09-28 DIAGNOSIS — J449 Chronic obstructive pulmonary disease, unspecified: Secondary | ICD-10-CM | POA: Diagnosis not present

## 2016-09-28 DIAGNOSIS — Z6835 Body mass index (BMI) 35.0-35.9, adult: Secondary | ICD-10-CM | POA: Diagnosis not present

## 2016-09-28 DIAGNOSIS — Z9181 History of falling: Secondary | ICD-10-CM | POA: Diagnosis not present

## 2016-09-28 DIAGNOSIS — L97411 Non-pressure chronic ulcer of right heel and midfoot limited to breakdown of skin: Secondary | ICD-10-CM | POA: Diagnosis not present

## 2016-09-28 DIAGNOSIS — S91302A Unspecified open wound, left foot, initial encounter: Secondary | ICD-10-CM | POA: Diagnosis not present

## 2016-09-28 DIAGNOSIS — M6281 Muscle weakness (generalized): Secondary | ICD-10-CM | POA: Diagnosis not present

## 2016-09-28 DIAGNOSIS — Z794 Long term (current) use of insulin: Secondary | ICD-10-CM | POA: Diagnosis not present

## 2016-09-28 DIAGNOSIS — E785 Hyperlipidemia, unspecified: Secondary | ICD-10-CM | POA: Diagnosis not present

## 2016-09-28 DIAGNOSIS — E1142 Type 2 diabetes mellitus with diabetic polyneuropathy: Secondary | ICD-10-CM | POA: Diagnosis not present

## 2016-09-28 DIAGNOSIS — I872 Venous insufficiency (chronic) (peripheral): Secondary | ICD-10-CM | POA: Diagnosis not present

## 2016-09-28 DIAGNOSIS — E669 Obesity, unspecified: Secondary | ICD-10-CM | POA: Diagnosis not present

## 2016-09-28 NOTE — Patient Outreach (Signed)
[place] [facility]) Care Management  09/28/2016  [name] [name] 12/24/1931 [id number]   CSW confirmed with [facility] SW that [name] has been confirmed.  Daughter aware; concerned about plans to move her now because she is having foot surgery on 10/12/16. "I have the van set up to take her to the appointments in [facility] for her pre-op and surgery".  THN CSW is attempting to reach out to [facility] who is coordinating the SNF auth and placement and will advise.     [name], MSW, [place] Worker  [place] [phone]

## 2016-09-28 NOTE — Patient Outreach (Signed)
[place] [facility]) Care Management  09/28/2016  [name] [name] 20-Nov-1931 [id number]   Follow up telephone call to patient daughter [name] to clarify [employer] benefit of transportation of 6 round trip rides per calendar year, or 12 one way trips. She verbalized understanding 12 one way rides that she has been using, equals 6 one round trips    Plan Will continue to follow patient progress.   [name], RN, [place] Management Coordinator  [phone]- Mobile [phone]- Toll Free Main Office

## 2016-09-29 DIAGNOSIS — I10 Essential (primary) hypertension: Secondary | ICD-10-CM | POA: Diagnosis not present

## 2016-09-29 DIAGNOSIS — E113593 Type 2 diabetes mellitus with proliferative diabetic retinopathy without macular edema, bilateral: Secondary | ICD-10-CM | POA: Diagnosis not present

## 2016-09-29 DIAGNOSIS — N183 Chronic kidney disease, stage 3 (moderate): Secondary | ICD-10-CM | POA: Diagnosis not present

## 2016-09-29 DIAGNOSIS — R54 Age-related physical debility: Secondary | ICD-10-CM | POA: Diagnosis not present

## 2016-10-02 ENCOUNTER — Other Ambulatory Visit: Payer: Self-pay | Admitting: Pharmacist

## 2016-10-02 ENCOUNTER — Other Ambulatory Visit: Payer: Self-pay | Admitting: *Deleted

## 2016-10-02 ENCOUNTER — Other Ambulatory Visit: Payer: Self-pay | Admitting: Pharmacy Technician

## 2016-10-02 NOTE — Patient Outreach (Signed)
[place] [facility]) Care Management  10/02/2016  KATHLEE BARNHARDT [birth year] [id number]   CSW spoke with SNF rep to communicate daughters concern for getting her to and from wound doctor appointment tomorrow. Reuben Likes, at [facility] reports she is working with the family to coordinate this. CSW will plan SNF visit tomorrow afternoon in hopes of catching patient after her morning MD appointment.        [name], MSW, [place] Worker  [place] [phone]

## 2016-10-02 NOTE — Patient Outreach (Signed)
[facility]) Care Management  10/02/2016  [name] [name] Apr 06, 1931 [id number]  I attempted to contact the patient today in reference to a patient assistance application that was mailed back to me. I did not have all the documentation required from the patient to submit to [employer]. The patient's spouse answered the phone and stated that his wife is in a SNF for 21 days. When I spoke with her daughter [name] last week she informed me that there is a possibility that the patient may permanently moved to a home. After speaking with [name] and [name], [place] I will just hold on to the application until I hear otherwise.   Doreene Burke, [place] [phone]

## 2016-10-02 NOTE — Patient Outreach (Signed)
[facility]) Care Management  10/02/2016  [name] [name] 1931/06/27 [id number]  Patient assistance paperwork received back from patient in the mail today and given to [place] to follow-up on.   Karrie Meres, PharmD, [place] [phone]

## 2016-10-03 ENCOUNTER — Other Ambulatory Visit: Payer: Self-pay | Admitting: *Deleted

## 2016-10-03 ENCOUNTER — Other Ambulatory Visit: Payer: Medicare HMO

## 2016-10-03 DIAGNOSIS — C541 Malignant neoplasm of endometrium: Secondary | ICD-10-CM | POA: Diagnosis not present

## 2016-10-04 ENCOUNTER — Other Ambulatory Visit: Payer: Self-pay | Admitting: *Deleted

## 2016-10-04 ENCOUNTER — Encounter: Payer: Medicare HMO | Admitting: Internal Medicine

## 2016-10-04 ENCOUNTER — Encounter: Payer: Self-pay | Admitting: *Deleted

## 2016-10-04 DIAGNOSIS — I872 Venous insufficiency (chronic) (peripheral): Secondary | ICD-10-CM | POA: Diagnosis not present

## 2016-10-04 DIAGNOSIS — L97411 Non-pressure chronic ulcer of right heel and midfoot limited to breakdown of skin: Secondary | ICD-10-CM | POA: Diagnosis not present

## 2016-10-04 DIAGNOSIS — M1990 Unspecified osteoarthritis, unspecified site: Secondary | ICD-10-CM | POA: Diagnosis not present

## 2016-10-04 DIAGNOSIS — E11621 Type 2 diabetes mellitus with foot ulcer: Secondary | ICD-10-CM | POA: Diagnosis not present

## 2016-10-04 DIAGNOSIS — J449 Chronic obstructive pulmonary disease, unspecified: Secondary | ICD-10-CM | POA: Diagnosis not present

## 2016-10-04 DIAGNOSIS — E1142 Type 2 diabetes mellitus with diabetic polyneuropathy: Secondary | ICD-10-CM | POA: Diagnosis not present

## 2016-10-04 DIAGNOSIS — E1151 Type 2 diabetes mellitus with diabetic peripheral angiopathy without gangrene: Secondary | ICD-10-CM | POA: Diagnosis not present

## 2016-10-04 DIAGNOSIS — Z794 Long term (current) use of insulin: Secondary | ICD-10-CM | POA: Diagnosis not present

## 2016-10-04 DIAGNOSIS — S91302A Unspecified open wound, left foot, initial encounter: Secondary | ICD-10-CM | POA: Diagnosis not present

## 2016-10-04 DIAGNOSIS — I2510 Atherosclerotic heart disease of native coronary artery without angina pectoris: Secondary | ICD-10-CM | POA: Diagnosis not present

## 2016-10-04 DIAGNOSIS — C541 Malignant neoplasm of endometrium: Secondary | ICD-10-CM

## 2016-10-04 DIAGNOSIS — S91301A Unspecified open wound, right foot, initial encounter: Secondary | ICD-10-CM | POA: Diagnosis not present

## 2016-10-04 NOTE — Patient Outreach (Signed)
[place] [facility]) Care Management  [facility] Social Work  10/04/2016  [name] [name] [birth year] [id number]  Subjective:  Patient admitted to [facility] from home on last Friday.  Objective: CSW to assist patient with commuity based resources to aide in her well-being, quality of life and overall safety/needs.    Encounter Medications:  Outpatient Encounter Prescriptions as of 10/03/2016  Medication Sig Note  . ACCU-CHEK SOFTCLIX LANCETS lancets CHECK BLOOD SUGARS TWICE DAILY   . acetaminophen (TYLENOL) 325 MG tablet Take 650 mg by mouth every 6 (six) hours as needed for mild pain.   [name] aspirin 81 MG tablet Take 81 mg by mouth daily.   . bisacodyl (DULCOLAX) 5 MG EC tablet Take 5 mg by mouth daily as needed for moderate constipation.   . carvedilol (COREG) 6.25 MG tablet Take 6.25 mg by mouth 2 (two) times daily with a meal.   . furosemide (LASIX) 40 MG tablet Take 80 mg by mouth 2 (two) times daily.   . insulin aspart (NOVOLOG FLEXPEN) 100 UNIT/ML FlexPen Inject into the skin 3 (three) times daily with meals. Sliding Scale 2 units-6 units patient has list for 100-400 BS readings  100-200, 2 units, 201-250, 4 units, 251--300 6 units, 301-350, 8 units, 351 to 400 10 units. 08/02/2016: Has on hand if needed  . Insulin NPH Isophane & Regular (NOVOLIN 70/30 [place]) Inject 40-50 Units into the skin. 50 units every morning  50 units every evening 08/02/2016: Taking 56 units in the morning and evening   . Insulin Pen Needle (RELION SHORT PEN NEEDLES) 31G X 8 MM MISC 1 each by Does not apply route 2 (two) times daily.   [name] lisinopril (PRINIVIL,ZESTRIL) 10 MG tablet Take 10 mg by mouth daily.   [name] Glycol-Propyl Glycol (SYSTANE) 0.4-0.3 % SOLN Place 1 drop into both eyes daily as needed (dry eyes).   . polyethylene glycol (MIRALAX / GLYCOLAX) packet USE 1-2 PACKETS DAILY   . pravastatin (PRAVACHOL) 40 MG tablet TAKE 1 TABLET BY MOUTH AT BEDTIME   . ranitidine (ZANTAC) 150 MG  tablet Take 150 mg by mouth 2 (two) times daily.    No facility-administered encounter medications on file as of 10/03/2016.     Functional Status:  In your present state of health, do you have any difficulty performing the following activities: 09/07/2016 08/16/2016  Hearing? N N  Vision? - N  Difficulty concentrating or making decisions? Y N  Comment family assist  -  Walking or climbing stairs? Y N  Dressing or bathing? Y N  Comment has personal care providers -  Doing errands, shopping? Y -  Conservation officer, nature and eating ? Y -  Using the Toilet? Y -  In the past six months, have you accidently leaked urine? Y -  Comment wears depends -  Do you have problems with loss of bowel control? N -  Managing your Medications? Y -  Managing your Finances? Y -  Comment family helps -  Housekeeping or managing your Housekeeping? Y -  Comment has personal care help -  Some recent data might be hidden    Fall/Depression Screening:  PHQ 2/9 Scores 09/07/2016 06/21/2016 05/26/2016 04/05/2016 12/28/2015 11/22/2015 11/18/2015  PHQ - 2 Score '1 1 1 1 ' 0 1 1    Assessment: CSW visited [facility] on 8/28 however patient was out at a MD appointment. CSW left Advance Directive papers at bedside and spoke with the SW at SNF who reports they are helping  family to pursue a transfer to SNF in [place] area so she can be closer to the [place] of her family.  CSW left phone message for daughter who called back and said they had taken patient to eat lunch on way back from MD in [facility].      Plan:  CSW will plan f/u call and SNF visit to assist with plans including need for Medicaid and long term placement.   THN CM Care Plan Problem One     Most Recent Value  Care Plan Problem One  Patient needing higher level of care than at home.  Role Documenting the Problem One  Clinical Social Worker  Care Plan for Problem One  Active  [facility] Long Term Goal   Patient will be placed in a facility in the next 90days.   THN Long Term Goal Start Date  09/15/16  THN Long Term Goal Met Date  09/29/16  Interventions for Problem One Long Term Goal  CSW assisting and coordianating placement with patient, family, PCP and [place] SW.   THN CM Short Term Goal #1      Interventions for Short Term Goal #1     THN CM Short Term Goal #2      Interventions for Short Term Goal #2     THN CM Short Term Goal #3     Interventions for Short Tern Goal #3     THN CM Short Term Goal #4     Interventions for Short Term Goal #4     THN CM Short Term Goal #5      Interventions for Short Term Goal #5       [facility] CM Care Plan Problem Two     Most Recent Value  Care Plan Problem Two     Role Documenting the Problem Two  Clinical Social Worker  [facility] CM Short Term Goal #1   Patient will not experience a fall in the next 30 days   THN CM Short Term Goal #1 Met Date   01/07/16  Interventions for Short Term Goal #2   Reinforced fall prevention strategies   THN CM Short Term Goal #2   Patient will have medical alert system in place in the next 30 days  Interventions for Short Term Goal #2  Assisted patient with making call to [facility] volunteer services for alert system   THN CM Short Term Goal #3   Patient will report doing walking in place exercises at least 5 minutes , 5 times a week in the next 30 days   Interventions for Short Term Goal #3  Discussed use of her foot pedals exercise, reinforced  to continue leg lift exercises while in bed        [name], MSW, [place] Worker  [place] [phone]

## 2016-10-06 ENCOUNTER — Other Ambulatory Visit (INDEPENDENT_AMBULATORY_CARE_PROVIDER_SITE_OTHER): Payer: Self-pay

## 2016-10-06 DIAGNOSIS — Z5189 Encounter for other specified aftercare: Secondary | ICD-10-CM | POA: Diagnosis not present

## 2016-10-06 DIAGNOSIS — J449 Chronic obstructive pulmonary disease, unspecified: Secondary | ICD-10-CM | POA: Diagnosis not present

## 2016-10-06 DIAGNOSIS — Z833 Family history of diabetes mellitus: Secondary | ICD-10-CM | POA: Diagnosis not present

## 2016-10-06 DIAGNOSIS — Z951 Presence of aortocoronary bypass graft: Secondary | ICD-10-CM | POA: Diagnosis not present

## 2016-10-06 DIAGNOSIS — I2510 Atherosclerotic heart disease of native coronary artery without angina pectoris: Secondary | ICD-10-CM | POA: Diagnosis not present

## 2016-10-06 DIAGNOSIS — Z823 Family history of stroke: Secondary | ICD-10-CM | POA: Diagnosis not present

## 2016-10-06 DIAGNOSIS — I110 Hypertensive heart disease with heart failure: Secondary | ICD-10-CM | POA: Diagnosis not present

## 2016-10-06 DIAGNOSIS — I10 Essential (primary) hypertension: Secondary | ICD-10-CM | POA: Diagnosis not present

## 2016-10-06 DIAGNOSIS — E785 Hyperlipidemia, unspecified: Secondary | ICD-10-CM | POA: Diagnosis not present

## 2016-10-06 DIAGNOSIS — D509 Iron deficiency anemia, unspecified: Secondary | ICD-10-CM | POA: Diagnosis not present

## 2016-10-06 DIAGNOSIS — R262 Difficulty in walking, not elsewhere classified: Secondary | ICD-10-CM | POA: Diagnosis not present

## 2016-10-06 DIAGNOSIS — Z9049 Acquired absence of other specified parts of digestive tract: Secondary | ICD-10-CM | POA: Diagnosis not present

## 2016-10-06 DIAGNOSIS — I6529 Occlusion and stenosis of unspecified carotid artery: Secondary | ICD-10-CM | POA: Diagnosis not present

## 2016-10-06 DIAGNOSIS — E1122 Type 2 diabetes mellitus with diabetic chronic kidney disease: Secondary | ICD-10-CM | POA: Diagnosis not present

## 2016-10-06 DIAGNOSIS — E119 Type 2 diabetes mellitus without complications: Secondary | ICD-10-CM | POA: Diagnosis not present

## 2016-10-06 DIAGNOSIS — I129 Hypertensive chronic kidney disease with stage 1 through stage 4 chronic kidney disease, or unspecified chronic kidney disease: Secondary | ICD-10-CM | POA: Diagnosis not present

## 2016-10-06 DIAGNOSIS — M6281 Muscle weakness (generalized): Secondary | ICD-10-CM | POA: Diagnosis not present

## 2016-10-06 DIAGNOSIS — M1990 Unspecified osteoarthritis, unspecified site: Secondary | ICD-10-CM | POA: Diagnosis not present

## 2016-10-06 DIAGNOSIS — I872 Venous insufficiency (chronic) (peripheral): Secondary | ICD-10-CM | POA: Diagnosis not present

## 2016-10-06 DIAGNOSIS — G4700 Insomnia, unspecified: Secondary | ICD-10-CM | POA: Diagnosis not present

## 2016-10-06 DIAGNOSIS — S91301A Unspecified open wound, right foot, initial encounter: Secondary | ICD-10-CM | POA: Diagnosis not present

## 2016-10-06 DIAGNOSIS — G4733 Obstructive sleep apnea (adult) (pediatric): Secondary | ICD-10-CM | POA: Diagnosis not present

## 2016-10-06 DIAGNOSIS — I509 Heart failure, unspecified: Secondary | ICD-10-CM | POA: Diagnosis not present

## 2016-10-06 DIAGNOSIS — E559 Vitamin D deficiency, unspecified: Secondary | ICD-10-CM | POA: Diagnosis not present

## 2016-10-06 DIAGNOSIS — E11621 Type 2 diabetes mellitus with foot ulcer: Secondary | ICD-10-CM | POA: Diagnosis not present

## 2016-10-06 DIAGNOSIS — S91302A Unspecified open wound, left foot, initial encounter: Secondary | ICD-10-CM | POA: Diagnosis not present

## 2016-10-06 DIAGNOSIS — E1151 Type 2 diabetes mellitus with diabetic peripheral angiopathy without gangrene: Secondary | ICD-10-CM | POA: Diagnosis not present

## 2016-10-06 DIAGNOSIS — I119 Hypertensive heart disease without heart failure: Secondary | ICD-10-CM | POA: Diagnosis not present

## 2016-10-06 DIAGNOSIS — R531 Weakness: Secondary | ICD-10-CM | POA: Diagnosis not present

## 2016-10-06 DIAGNOSIS — I5020 Unspecified systolic (congestive) heart failure: Secondary | ICD-10-CM | POA: Diagnosis not present

## 2016-10-06 DIAGNOSIS — Z794 Long term (current) use of insulin: Secondary | ICD-10-CM | POA: Diagnosis not present

## 2016-10-06 DIAGNOSIS — G4709 Other insomnia: Secondary | ICD-10-CM | POA: Diagnosis not present

## 2016-10-06 DIAGNOSIS — K5900 Constipation, unspecified: Secondary | ICD-10-CM | POA: Diagnosis not present

## 2016-10-06 DIAGNOSIS — N183 Chronic kidney disease, stage 3 (moderate): Secondary | ICD-10-CM | POA: Diagnosis not present

## 2016-10-06 DIAGNOSIS — C541 Malignant neoplasm of endometrium: Secondary | ICD-10-CM | POA: Diagnosis not present

## 2016-10-06 DIAGNOSIS — D649 Anemia, unspecified: Secondary | ICD-10-CM | POA: Diagnosis not present

## 2016-10-06 DIAGNOSIS — C55 Malignant neoplasm of uterus, part unspecified: Secondary | ICD-10-CM | POA: Diagnosis not present

## 2016-10-06 DIAGNOSIS — Z87891 Personal history of nicotine dependence: Secondary | ICD-10-CM | POA: Diagnosis not present

## 2016-10-06 DIAGNOSIS — I70238 Atherosclerosis of native arteries of right leg with ulceration of other part of lower right leg: Secondary | ICD-10-CM | POA: Diagnosis not present

## 2016-10-06 DIAGNOSIS — Z9889 Other specified postprocedural states: Secondary | ICD-10-CM | POA: Diagnosis not present

## 2016-10-06 DIAGNOSIS — L97529 Non-pressure chronic ulcer of other part of left foot with unspecified severity: Secondary | ICD-10-CM | POA: Diagnosis not present

## 2016-10-06 DIAGNOSIS — Z8673 Personal history of transient ischemic attack (TIA), and cerebral infarction without residual deficits: Secondary | ICD-10-CM | POA: Diagnosis not present

## 2016-10-06 DIAGNOSIS — K219 Gastro-esophageal reflux disease without esophagitis: Secondary | ICD-10-CM | POA: Diagnosis not present

## 2016-10-06 DIAGNOSIS — E1142 Type 2 diabetes mellitus with diabetic polyneuropathy: Secondary | ICD-10-CM | POA: Diagnosis not present

## 2016-10-06 DIAGNOSIS — L97421 Non-pressure chronic ulcer of left heel and midfoot limited to breakdown of skin: Secondary | ICD-10-CM | POA: Diagnosis not present

## 2016-10-06 DIAGNOSIS — E113593 Type 2 diabetes mellitus with proliferative diabetic retinopathy without macular edema, bilateral: Secondary | ICD-10-CM | POA: Diagnosis not present

## 2016-10-06 DIAGNOSIS — E669 Obesity, unspecified: Secondary | ICD-10-CM | POA: Diagnosis not present

## 2016-10-06 DIAGNOSIS — E11622 Type 2 diabetes mellitus with other skin ulcer: Secondary | ICD-10-CM | POA: Diagnosis not present

## 2016-10-06 DIAGNOSIS — L97411 Non-pressure chronic ulcer of right heel and midfoot limited to breakdown of skin: Secondary | ICD-10-CM | POA: Diagnosis not present

## 2016-10-06 NOTE — Progress Notes (Signed)
[name] [name] [name] [name] ([id number]) Visit Report for 10/04/2016 Arrival Information Details [name] [name] Date of Service: 10/04/2016 1:30 PM [name] Patient Account Number: [id number] Medical Record Treating RN: Montey Hora [id number] Number: Other Clinician: Date of Birth/Sex: 12/31/1931 (83 y.o. Female) Treating ROBSON, MICHAEL Primary Care [name] [name]: LAM, [name] [name]: G Referring [name] [name]: [name] [name] Weeks in Treatment: 11 Visit Information History Since Last Visit Added or deleted any medications: No Patient Arrived: Wheel Chair Any new allergies or adverse reactions: No Arrival Time: 13:39 Had a fall or experienced change in No activities of daily living that may affect Accompanied By: dtr risk of falls: Transfer Assistance: Manual Signs or symptoms of abuse/neglect since last No Patient Identification Verified: Yes visito Secondary Verification Process Yes Hospitalized since last visit: No Completed: Has Dressing in Place as Prescribed: Yes Patient Requires Transmission-Based No Pain Present Now: No Precautions: Patient Has Alerts: No Electronic SignatureAtkinss) Signed: 10/04/2016 5:39:07 PM By: Montey Hora Entered By: Montey Hora on 10/04/2016 13:40:42 [name] [name] [name]) -------------------------------------------------------------------------------- Clinic Level of Care Assessment Details [name] [name] Date of Service: 10/04/2016 1:30 PM [name] Patient Account Number: [id number] Medical Record Treating RN: Montey Hora [id number] Number: Other Clinician: Date of Birth/Sex: 1931-07-27 (83 y.o. Female) Treating ROBSON, [place] Primary Care [name] [name]: [name] [name] [name] [name]/Extender: G Referring [name] [name]: [name] [name] Weeks in Treatment: 11 Clinic Level of Care Assessment Items TOOL 4 Quantity Score []  - Use when only an EandM is performed on FOLLOW-UP visit 0 ASSESSMENTS - Nursing Assessment /  Reassessment X - Reassessment of Co-morbidities (includes updates in patient status) 1 10 X - Reassessment of Adherence to Treatment Plan 1 5 ASSESSMENTS - Wound and Skin Assessment / Reassessment []  - Simple Wound Assessment / Reassessment - one wound 0 X - Complex Wound Assessment / Reassessment - multiple wounds 3 5 []  - Dermatologic / Skin Assessment (not related to wound area) 0 ASSESSMENTS - Focused Assessment []  - Circumferential Edema Measurements - multi extremities 0 []  - Nutritional Assessment / Counseling / Intervention 0 X - Lower Extremity Assessment (monofilament, tuning fork, pulses) 1 5 []  - Peripheral Arterial Disease Assessment (using hand held doppler) 0 ASSESSMENTS - Ostomy and/or Continence Assessment and Care []  - Incontinence Assessment and Management 0 []  - Ostomy Care Assessment and Management (repouching, etc.) 0 PROCESS - Coordination of Care X - Simple Patient / Family Education for ongoing care 1 15 []  - Complex (extensive) Patient / Family Education for ongoing care 0 []  - Staff obtains Programmer, systems, Records, Test Results / Process Orders 0 []  - Staff telephones HHA, Nursing Homes / Clarify orders / etc 0 [name], [name] ([id number]) []  - Routine Transfer to another Facility (non-emergent condition) 0 []  - Routine Hospital Admission (non-emergent condition) 0 []  - New Admissions / Biomedical engineer / Ordering NPWT, Apligraf, etc. 0 []  - Emergency Hospital Admission (emergent condition) 0 X - Simple Discharge Coordination 1 10 []  - Complex (extensive) Discharge Coordination 0 PROCESS - Special Needs []  - Pediatric / Minor Patient Management 0 []  - Isolation Patient Management 0 []  - Hearing / Language / Visual special needs 0 []  - Assessment of Community assistance (transportation, D/C planning, etc.) 0 []  - Additional assistance / Altered mentation 0 []  - Support SurfaceAtkinss) Assessment (bed, cushion, seat, etc.) 0 INTERVENTIONS - Wound  Cleansing / Measurement []  - Simple Wound Cleansing - one wound 0 X - Complex Wound Cleansing - multiple wounds 3 5 X - Wound Imaging (photographs - any number of wounds) 1  5 []  - Wound Tracing (instead of photographs) 0 []  - Simple Wound Measurement - one wound 0 X - Complex Wound Measurement - multiple wounds 3 5 INTERVENTIONS - Wound Dressings X - Small Wound Dressing one or multiple wounds 2 10 []  - Medium Wound Dressing one or multiple wounds 0 []  - Large Wound Dressing one or multiple wounds 0 []  - Application of Medications - topical 0 []  - Application of Medications - injection 0 [name], [name] ([id number]) INTERVENTIONS - Miscellaneous []  - External ear exam 0 []  - Specimen Collection (cultures, biopsies, blood, body fluids, etc.) 0 []  - SpecimenAtkinss) / CultureAtkinss) sent or taken to Lab for analysis 0 []  - Patient Transfer (multiple staff / Harrel Lemon Lift / Similar devices) 0 []  - Simple Staple / Suture removal (25 or less) 0 []  - Complex Staple / Suture removal (26 or more) 0 []  - Hypo / Hyperglycemic Management (close monitor of Blood Glucose) 0 []  - Ankle / Brachial Index (ABI) - do not check if billed separately 0 X - Vital Signs 1 5 Has the patient been seen at the hospital within the last three years: Yes Total Score: 120 Level Of Care: New/Established - Level 4 Electronic SignatureAtkinss) Signed: 10/04/2016 5:39:07 PM By: Montey Hora Entered By: Montey Hora on 10/04/2016 17:01:33 [name] [name] [name]) -------------------------------------------------------------------------------- Encounter Discharge Information Details [name] [name] Date of Service: 10/04/2016 1:30 PM [name] Patient Account Number: [id number] Medical Record Treating RN: Montey Hora [id number] Number: Other Clinician: Date of Birth/Sex: [birth year] (83 y.o. Female) Treating ROBSON, MICHAEL Primary Care Zevin Nevares: [name] [name] [name] [name]/Extender: G Referring  Ellery Tash: [name] [name] Weeks in Treatment: 11 Encounter Discharge Information Items Discharge Pain Level: 0 Discharge Condition: Stable Ambulatory Status: Wheelchair Discharge Destination: Nursing Home Transportation: Private Auto Accompanied By: dtr Schedule Follow-up Appointment: Yes Medication Reconciliation completed and provided to Patient/Care No Delrae Hagey: Provided on Clinical Summary of Care: 10/04/2016 Form Type Recipient Paper Patient JMS Electronic SignatureAtkinss) Signed: 10/04/2016 5:02:47 PM By: Montey Hora Entered By: Montey Hora on 10/04/2016 17:02:47 [name] [name] [name]) -------------------------------------------------------------------------------- Lower Extremity Assessment Details [name], Cleone Date of Service: 10/04/2016 1:30 PM [name] Patient Account Number: [id number] Medical Record Treating RN: Montey Hora [id number] Number: Other Clinician: Date of Birth/Sex: 1931-12-24 (83 y.o. Female) Treating ROBSON, MICHAEL Primary Care Janaria Mccammon: [name] [name] Kutler Vanvranken/Extender: G Referring Sayf Kerner: [name] [name] Weeks in Treatment: 11 Vascular Assessment Pulses: Dorsalis Pedis Palpable: [Left:Yes] [Right:Yes] Posterior Tibial Extremity colors, hair growth, and conditions: Extremity Color: [Left:Hyperpigmented] [Right:Hyperpigmented] Hair Growth on Extremity: [Left:Yes] [Right:Yes] Temperature of Extremity: [Left:Warm] [Right:Warm] Capillary Refill: [Left:< 3 seconds] [Right:< 3 seconds] Toe Nail Assessment Left: Right: Thick: Yes Yes Discolored: Yes Yes Deformed: No No Improper Length and Hygiene: Yes Yes Electronic SignatureAtkinss) Signed: 10/04/2016 5:39:07 PM By: Montey Hora Entered By: Montey Hora on 10/04/2016 13:51:47 [name] [name] [name]) -------------------------------------------------------------------------------- Multi Wound Chart Details [name], Tishawna Date of Service: 10/04/2016 1:30  PM [name] Patient Account Number: [id number] Medical Record Treating RN: Montey Hora [id number] Number: Other Clinician: Date of Birth/Sex: 1931-06-16 (83 y.o. 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] [3:the Lower Extremity] [4:To be determined] Comorbid History: [1:Cataracts, Arrhythmia, Coronary Artery Disease, Hypertension, Type II Diabetes, History of pressure wounds, Osteoarthritis, Neuropathy] [3:N/A] [4:Cataracts, Arrhythmia, Coronary Artery Disease, Hypertension, Type II Diabetes,  History of pressure wounds, Osteoarthritis, Neuropathy] Date Acquired: [1:06/13/2016] [3:07/10/2016] [4:10/04/2016] Weeks of Treatment: [1:11] [3:8] [4:0] Wound Status: [1:Open] [3:Healed - Epithelialized] [4:Open] Measurements L x W x D 0.4x0.4x0.1 [3:0x0x0] [4:0.4x1.2x0.1] (cm) Area (cm) : [1:0.126] [3:0] [4:0.377] Volume (cm) : [1:0.013] [3:0] [4:0.038] % Reduction in Area: [1:96.50%] [3:100.00%] [4:0.00%] % Reduction in Volume: 96.40% [3:100.00%] [4:0.00%] Classification: [1:Grade 1] [3:Grade 2] [4:Partial Thickness] Exudate Amount: [1:Large] [3:N/A] [4:Large] Exudate Type: [1:Serous] [3:N/A] [4:Serous] Exudate Color: [1:amber] [3:N/A] [4:amber] Foul Odor After [1:Yes] [3:N/A] [4:No] Cleansing: Odor Anticipated Due to No [3:N/A] [4:N/A] Product Use: [name], Lamija C. ([id number]) Wound Margin:  Distinct, outline attached N/A Flat and Intact Granulation Amount: Large (67-100%) N/A None Present (0%) Granulation Quality: Pale N/A N/A Necrotic Amount: Small (1-33%) N/A Large (67-100%) Exposed Structures: Fat Layer (Subcutaneous N/A Fascia: No Tissue) Exposed: Yes Fat Layer (Subcutaneous Fascia: No Tissue) Exposed: No Tendon: No Tendon: No Muscle: No Muscle: No Joint: No Joint: No Bone: No Bone: No Epithelialization: Medium (34-66%) N/A None Periwound Skin Texture: Excoriation: No No Abnormalities Noted Excoriation: No Induration: No Induration: No Callus: No Callus: No Crepitus: No Crepitus: No Rash: No Rash: No Scarring: No Scarring: No Periwound Skin Maceration: No No Abnormalities Noted Maceration: No Moisture: Dry/Scaly: No Dry/Scaly: No Periwound Skin Color: Atrophie Blanche: No No Abnormalities Noted Atrophie Blanche: No Cyanosis: No Cyanosis: No Ecchymosis: No Ecchymosis: No Erythema: No Erythema: No Hemosiderin Staining: No Hemosiderin Staining: No Mottled: No Mottled: No Pallor: No Pallor: No Rubor: No Rubor: No Temperature: No Abnormality N/A No Abnormality Tenderness on No No Yes Palpation: Wound Preparation: Ulcer Cleansing: N/A Ulcer Cleansing: Rinsed/Irrigated with Rinsed/Irrigated with Saline Saline Topical Anesthetic Topical Anesthetic Applied: Other: lidocaine Applied: Other: lidocaine 4% 4% Treatment Notes Electronic SignatureAtkinss) Signed: 10/04/2016 5:01:41 PM By: Linton Ham MD Entered By: Linton Ham on 10/04/2016 14:47:55 [name] [name] [name]) -------------------------------------------------------------------------------- Multi-Disciplinary Care Plan Details [name], Angy Date of Service: 10/04/2016 1:30 PM [name] Patient Account Number: [id number] Medical Record Treating RN: Montey Hora [id number] Number: Other Clinician: Date of Birth/Sex: [birth year] (83 y.o. Female) Treating  ROBSON, MICHAEL Primary Care Taneil Lazarus: [name] [name] Vincentina Sollers/Extender: G Referring Reagen Haberman: [name] [name] Weeks in Treatment: 11 Active Inactive ` Orientation to the Wound Care Program Nursing Diagnoses: Knowledge deficit related to the wound healing center program Goals: Patient/caregiver will verbalize understanding of the [facility] Date Initiated: 07/26/2016 Target Resolution Date: 07/31/2016 Goal Status: Active Interventions: Provide education on orientation to the wound center Notes: ` Soft Tissue Infection Nursing Diagnoses: Impaired tissue integrity Goals: Signs and symptoms of infection will be recognized early to allow for prompt treatment Date Initiated: 07/26/2016 Target Resolution Date: 07/31/2016 Goal Status: Active Interventions: Assess signs and symptoms of infection every visit Notes: ` Wound/Skin Impairment Nursing Diagnoses: ALIEYAH, SPADER ([id number]) Impaired tissue integrity Goals: Ulcer/skin breakdown will heal within 14 weeks Date Initiated: 07/26/2016 Target Resolution Date: 10/30/2016 Goal Status: Active Interventions: Assess patient/caregiver ability to perform ulcer/skin care regimen upon admission and as needed Treatment Activities: Skin care regimen initiated : 07/26/2016 Notes: Electronic SignatureAtkinss) Signed: 10/04/2016 5:39:07 PM By: Montey Hora Entered By: Montey Hora on 10/04/2016 14:12:21 [name] [name] [name]) -------------------------------------------------------------------------------- Pain Assessment Details [name], Scotlyn Date of Service: 10/04/2016 1:30 PM [name] Patient Account Number: [id number] Medical Record Treating  RN: Montey Hora [id number] Number: Other Clinician: Date of Birth/Sex: 1931/02/11 (83 y.o. Female) Treating ROBSON, MICHAEL Primary Care Makaiya Geerdes: [name] [name] Beatrix Breece/Extender: G Referring Mettie Roylance: [name] [name] Weeks in Treatment: 11 Active  Problems Location of Pain Severity and Description of Pain Patient Has Paino No Site Locations Pain Management and Medication Current Pain Management: Notes Topical or injectable lidocaine is offered to patient for acute pain when surgical debridement is performed. If needed, Patient is instructed to use over the counter pain medication for the following 24-48 hours after debridement. Wound care MDs do not prescribed pain medications. Patient has chronic pain or uncontrolled pain. Patient has been instructed to make an appointment with their Primary Care Physician for pain management. Electronic SignatureAtkinss) Signed: 10/04/2016 5:39:07 PM By: Montey Hora Entered By: Montey Hora on 10/04/2016 13:40:50 [name] [name] [name]) -------------------------------------------------------------------------------- Patient/Caregiver Education Details [name], Timmya Date of Service: 10/04/2016 1:30 PM [name] Patient Account Number: [id number] Medical Record Treating RN: Montey Hora [id number] Number: Other Clinician: Date of Birth/Gender: 10/01/1931 (83 y.o. Female) Treating ROBSON, MICHAEL Primary Care Physician/Extender: Autumn Messing Physician: Weeks in Treatment: 11 Referring Physician: Charlott Holler Education Assessment Education Provided To: Caregiver SNF staff via written orders Education Topics Provided Wound/Skin Impairment: Handouts: Other: wound care orders Methods: Explain/Verbal Electronic SignatureAtkinss) Signed: 10/04/2016 5:39:07 PM By: Montey Hora Entered By: Montey Hora on 10/04/2016 17:03:12 [name] [name] [name]) -------------------------------------------------------------------------------- Wound Assessment Details [name], Latarra Date of Service: 10/04/2016 1:30 PM [name] Patient Account Number: [id number] Medical Record Treating RN: Montey Hora [id number] Number: Other Clinician: Date of Birth/Sex:  1931/03/17 (83 y.o. Female) Treating ROBSON, MICHAEL Primary Care Nasirah Sachs: [name] [name] Cheronda Erck/Extender: G Referring Iyona Pehrson: [name] [name] Weeks in Treatment: 11 Wound Status Wound Number: 1 Primary Diabetic Wound/Ulcer of the Lower Etiology: Extremity Wound Location: Right Calcaneus Wound Open Wounding Event: Gradually Appeared Status: Date Acquired: 06/13/2016 Comorbid Cataracts, Arrhythmia, Coronary Artery Weeks Of Treatment: 11 History: Disease, Hypertension, Type II Clustered Wound: No Diabetes, History of pressure wounds, Osteoarthritis, Neuropathy Wound Measurements Length: (cm) 0.4 Width: (cm) 0.4 Depth: (cm) 0.1 Area: (cm) 0.126 Volume: (cm) 0.013 % Reduction in Area: 96.5% % Reduction in Volume: 96.4% Epithelialization: Medium (34-66%) Tunneling: No Undermining: No Wound Description Classification: Grade 1 Wound Margin: Distinct, outline attached Exudate Amount: Large Exudate Type: Serous Exudate Color: amber Foul Odor After Cleansing: Yes Due to Product Use: No Slough/Fibrino Yes Wound Bed Granulation Amount: Large (67-100%) Exposed Structure Granulation Quality: Pale Fascia Exposed: No Necrotic Amount: Small (1-33%) Fat Layer (Subcutaneous Tissue) Exposed: Yes Necrotic Quality: Adherent Slough Tendon Exposed: No Muscle Exposed: No Joint Exposed: No Bone Exposed: No Periwound Skin Texture Texture Color No Abnormalities Noted: No No Abnormalities Noted: No Callus: No Atrophie Blanche: No [name], Jailynn C. ([id number]) Crepitus: No Cyanosis: No Excoriation: No Ecchymosis: No Induration: No Erythema: No Rash: No Hemosiderin Staining: No Scarring: No Mottled: No Pallor: No Moisture Rubor: No No Abnormalities Noted: No Dry / Scaly: No Temperature / Pain Maceration: No Temperature: No Abnormality Wound Preparation Ulcer Cleansing: Rinsed/Irrigated with Saline Topical Anesthetic Applied: Other: lidocaine 4%, Treatment Notes Wound  #1 (Right Calcaneus) 1. Cleansed with: Clean wound with Normal Saline 2. Anesthetic Topical Lidocaine 4% cream to wound bed prior to debridement 4. Dressing Applied: Aquacel Ag 5. Secondary Dressing Applied Foam Gauze and Kerlix/Conform 7. Secured with Tape Notes heel cup Electronic SignatureAtkinss) Signed: 10/04/2016 5:39:07 PM By: Montey Hora Entered By: Montey Hora on 10/04/2016 14:12:54 [name] [name] [name]) -------------------------------------------------------------------------------- Wound Assessment Details [name],  Yolando Date of Service: 10/04/2016 1:30 PM [name] Patient Account Number: [id number] Medical Record Treating RN: Montey Hora [id number] Number: Other Clinician: Date of Birth/Sex: 11/24/1931 (83 y.o. Female) Treating ROBSON, MICHAEL Primary Care Keneth Borg: [name] [name] Akram Kissick/Extender: G Referring Vernis Cabacungan: [name] [name] Weeks in Treatment: 11 Wound Status Wound Number: 3 Primary Diabetic Wound/Ulcer of the Lower Etiology: Extremity Wound Location: Right, Medial Toe Fifth Wound Status: Healed - Epithelialized Wounding Event: Pressure Injury Date Acquired: 07/10/2016 Weeks Of Treatment: 8 Clustered Wound: No Photos Photo Uploaded By: Montey Hora on 10/05/2016 13:15:22 Wound Measurements Length: (cm) 0 % Reduction in Width: (cm) 0 % Reduction in Depth: (cm) 0 Area: (cm) 0 Volume: (cm) 0 Area: 100% Volume: 100% Wound Description Classification: Grade 2 Periwound Skin Texture Texture Color No Abnormalities Noted: No No Abnormalities Noted: No Moisture No Abnormalities Noted: No Electronic SignatureAtkinss) ROZINA, POINTER ([id number]) Signed: 10/04/2016 5:39:07 PM By: Montey Hora Entered By: Montey Hora on 10/04/2016 14:12:42 [name] [name] [name]) -------------------------------------------------------------------------------- Wound Assessment Details [name], Ryna Date of  Service: 10/04/2016 1:30 PM [name] Patient Account Number: [id number] Medical Record Treating RN: Montey Hora [id number] Number: Other Clinician: Date of Birth/Sex: 01-17-1932 (83 y.o. Female) Treating ROBSON, MICHAEL Primary Care Micheline Markes: [name] [name] Ulyess Muto/Extender: G Referring Keslee Harrington: [name] [name] Weeks in Treatment: 11 Wound Status Wound Number: 4 Primary To be determined Etiology: Wound Location: Left Calcaneus Wound Open Wounding Event: Gradually Appeared Status: Date Acquired: 10/04/2016 Comorbid Cataracts, Arrhythmia, Coronary Artery Weeks Of Treatment: 0 History: Disease, Hypertension, Type II Clustered Wound: No Diabetes, History of pressure wounds, Osteoarthritis, Neuropathy Photos Photo Uploaded By: Montey Hora on 10/05/2016 13:15:23 Wound Measurements Length: (cm) 0.4 Width: (cm) 1.2 Depth: (cm) 0.1 Area: (cm) 0.377 Volume: (cm) 0.038 % Reduction in Area: 0% % Reduction in Volume: 0% Epithelialization: None Tunneling: No Undermining: No Wound Description Classification: Partial Thickness Foul Odor After Wound Margin: Flat and Intact Slough/Fibrino Exudate Amount: Large Exudate Type: Serous Exudate Color: amber Cleansing: No Yes Wound Bed Granulation Amount: None Present (0%) Exposed Structure [name], Avana C. ([id number]) Necrotic Amount: Large (67-100%) Fascia Exposed: No Necrotic Quality: Adherent Slough Fat Layer (Subcutaneous Tissue) Exposed: No Tendon Exposed: No Muscle Exposed: No Joint Exposed: No Bone Exposed: No Periwound Skin Texture Texture Color No Abnormalities Noted: No No Abnormalities Noted: No Callus: No Atrophie Blanche: No Crepitus: No Cyanosis: No Excoriation: No Ecchymosis: No Induration: No Erythema: No Rash: No Hemosiderin Staining: No Scarring: No Mottled: No Pallor: No Moisture Rubor: No No Abnormalities Noted: No Dry / Scaly: No Temperature / Pain Maceration: No Temperature: No  Abnormality Tenderness on Palpation: Yes Wound Preparation Ulcer Cleansing: Rinsed/Irrigated with Saline Topical Anesthetic Applied: Other: lidocaine 4%, Treatment Notes Wound #4 (Left Calcaneus) 1. Cleansed with: Clean wound with Normal Saline 2. Anesthetic Topical Lidocaine 4% cream to wound bed prior to debridement 4. Dressing Applied: Aquacel Ag 5. Secondary Dressing Applied Foam Gauze and Kerlix/Conform 7. Secured with Tape Notes heel cup Electronic SignatureAtkinss) Signed: 10/04/2016 5:39:07 PM By: Montey Hora Entered By: Montey Hora on 10/04/2016 14:13:36 [name] [name] [name]) [name], REGINNA SERMENO ([id number]) -------------------------------------------------------------------------------- Vitals Details [name], Maddilynn Date of Service: 10/04/2016 1:30 PM [name] Patient Account Number: [id number] Medical Record Treating RN: Montey Hora [id number] Number: Other Clinician: Date of Birth/Sex: 1931-07-20 (83 y.o. Female) Treating ROBSON, MICHAEL Primary Care Jemaine Prokop: [name] [name] Syna Gad/Extender: G Referring Loda Bialas: [name] [name] Weeks in Treatment: 11 Vital Signs Time Taken: 12:47 Temperature (F): 98.4 Height (in): 63 Pulse (bpm): 58 Weight (lbs):  203 Respiratory Rate (breaths/min): 20 Body Mass Index (BMI): 36 Blood Pressure (mmHg): 153/47 Reference Range: 80 - 120 mg / dl Electronic SignatureAtkinss) Signed: 10/04/2016 5:39:07 PM By: Montey Hora Entered By: Montey Hora on 10/04/2016 13:44:27

## 2016-10-06 NOTE — Progress Notes (Signed)
[name] [name], [name] [name] ([id number]) Visit Report for 10/04/2016 HPI Details [name] [name], [name] [name] Date of Service: 10/04/2016 1:30 PM [name] Patient Account Number: [id number] Medical Record Treating RN: Montey Hora [id number] Number: Other Clinician: Date of Birth/Sex: April 12, 1931 (83 y.o. Female) Treating ROBSON, MICHAEL Primary Care Provider: LAM, LYNN Provider/Extender: G Referring Provider: LAM, LYNN Weeks in Treatment: 11 History of Present Illness HPI Description: 07/19/16; this is an 83 year old woman who tells me that she has had a wound on her right heel for about a month and a half. She said she was hospitalized with postmenopausal vaginal bleeding and was hospitalized ultimately at [facility] for this. I do not have these records. Afterward she was sent to [facility] for rehabilitation. She states there she developed a wound on her foot. The patient is a type II diabetic on insulin, I'm not really sure of her diabetic control. She has well care or home health we think they are putting Silvadene cream on this. ABIs in our clinic were 0.6 bilaterally. The patient and her husband it was somewhat vague history of possible stent placement in the right thigh 2-3 years ago although I don't see anything about this and [place] link. We will need to research this more before we see her again next week. She did have a normal x-ray of the heel at the end of May I believe when she was seen in the ER. There was no evidence of osteomyelitis although soft tissue edema was noted. Looking through care everywhere and [place] link I can see no reference to vascular surgery, noninvasive vascular tests. It is possible that this was non-at the local vein and vascular Center before they became part of the network. This would be Dr. Lucky Cowboy and Dr. Delana Meyer we will check with their office 07/26/16; the patient's arterial studies were really not very good she has an ABI in the right of 0.52  and a TBI on the right of 0.34. Corresponding values on the left for better at 0.83 on the left and a TBI of 0.54. She was felt to have right mid SFA occlusion and a 50-74% long segment stenosis and diffuse 30-49% left femoral popliteal disease with an occluded left tibial peroneal trunk. In spite of this her wound on her right heel appears to be a lot better today. Smaller with a healthy granulated surface. I have research notes in [place]. The patient is indeed followed by Dr. Gwenlyn Found of cardiology for coronary artery disease status post coronary artery bypass grafting about 7 years ago. He also notes a history of PAD and procedures by Dr. dew in [place] at [place]. In spite of this the patient's wound is better today. She is nonambulatory does not complain of pain or claudication and really I don't think given everything that she requires an arteriogram or an invasive arterial evaluation. 08/01/16; in spite of her likely significant PAD the patient's wound continues to contract. As long as this is the case I am going to forego additional arterial consultation. We have been using silver alginate. She is down 0.4 cm in length today 08/08/16; patient has an appointment with Dr. Fletcher Anon today in follow-up for her PAD. Vascular studies that we did last month I think are the cause of this. She has a new wound on her lateral right fifth toe and an unruptured blister on the medial left heel. Her major wound on the right heel is at 1.5 x 1.5 x 0.1 [name] [name] ([id number]) 09/05/16; the patient  was last here she had vascular studies done by Dr. Fletcher Anon. This showed a ABI on the right at 0.5 to and on the left at 0.83. TBI is on the right at 0.34 on the left at 0.54. She has been subsequently seen by Dr. dew who is somewhat annoyed that we have sent her to cardiology for her studies. Nevertheless he felt that it necessary to proceed with an arteriogram for possible salvage. She has 2 remaining wounds  one on the right heel one on the medial aspect of the right fifth toe which was new the last visit. She had an area over the left lateral malleolus which is resolved. 09/19/16 on evaluation today patient appears to be doing fairly well in regard to her right lower extremity wound. She still has not had her vascular procedure for stent placement due to conflict with other appointments. For that reason her wounds do appear to be healing albeit slowly. No fevers, chills, nausea, or vomiting noted at this time. 10/04/16; I have not seen this patient in about 4 weeks. She had a wound between her right fourth and fifth toe which is healed. The other area is on the right heel laterally. Today she arrives with a new wound on the left heel medially. Also new since the last time she was here she is now in a nursing home in [place], [place] and rehabilitation. She is having arteriogram witho Stent placement by Dr. dew on September 6 Electronic Signature(s) Signed: 10/04/2016 5:01:41 PM By: Linton Ham MD Entered By: Linton Ham on 10/04/2016 14:49:48 [name] [name], [name] [name] ([id number]) -------------------------------------------------------------------------------- Physical Exam Details [name] [name], [name] [name] Date of Service: 10/04/2016 1:30 PM [name] Patient Account Number: [id number] Medical Record Treating RN: Montey Hora [id number] Number: Other Clinician: Date of Birth/Sex: [birth year] (83 y.o. Female) Treating ROBSON, MICHAEL Primary Care Provider: LAM, LYNN Provider/Extender: G Referring Provider: LAM, LYNN Weeks in Treatment: 11 Constitutional Patient is hypertensive.. Pulse regular and within target range for patient.[name] Respirations regular, non-labored and within target range.. Temperature is normal and within the target range for the patient.[name] appears in no distress. Eyes Conjunctivae clear. No discharge. Respiratory Respiratory effort is easy and symmetric  bilaterally. Rate is normal at rest and on room air.. Cardiovascular Pedal pulses absent bilaterally.. Edema present in both extremities.. Lymphatic Nonpalpable no popliteal or inguinal area. Psychiatric No evidence of depression, anxiety, or agitation. Calm, cooperative, and communicative. Appropriate interactions and affect.. Notes Wound exam; the patient has no open area between the right fourth and fifth toe in the webspace. Her area on the right lateral heel is about the same as I remember this but unfortunately she has a new area on the left medial heel at least new to this clinic. Electronic Signature(s) Signed: 10/04/2016 5:01:41 PM By: Linton Ham MD Entered By: Linton Ham on 10/04/2016 14:51:01 [name] [name], [name] [name] ([id number]) -------------------------------------------------------------------------------- Physician Orders Details [name] [name], [name] [name] Date of Service: 10/04/2016 1:30 PM [name] Patient Account Number: [id number] Medical Record Treating RN: Montey Hora [id number] Number: Other Clinician: Date of Birth/Sex: Sep 20, 1931 (83 y.o. Female) Treating ROBSON, MICHAEL Primary Care Provider: LAM, LYNN Provider/Extender: G Referring Provider: LAM, LYNN Weeks in Treatment: 44 Verbal / Phone Orders: No Diagnosis Coding Wound Cleansing Wound #1 Right Calcaneus o Cleanse wound with mild soap and water o May Shower, gently pat wound dry prior to applying new dressing. Wound #4 Left Calcaneus o Cleanse wound with mild soap and water o May Shower, gently pat wound dry prior to applying  new dressing. Anesthetic Wound #1 Right Calcaneus o Topical Lidocaine 4% cream applied to wound bed prior to debridement - in clinic Wound #4 Left Calcaneus o Topical Lidocaine 4% cream applied to wound bed prior to debridement - in clinic Primary Wound Dressing Wound #1 Right Calcaneus o Aquacel Ag Wound #4 Left Calcaneus o Aquacel  Ag Secondary Dressing Wound #1 Right Calcaneus o Gauze and Kerlix/Conform o Other - heel pads Wound #4 Left Calcaneus o Gauze and Kerlix/Conform o Other - heel pads Dressing Change Frequency Wound #1 Right Calcaneus [name] [name], Sheronda Loletha Grayer ([id number]) o Change Dressing Monday, Wednesday, Friday - she comes to the wound clinic wednesdays Wound #4 Left Calcaneus o Change Dressing Monday, Wednesday, Friday - she comes to the wound clinic wednesdays Follow-up Appointments Wound #1 Right Calcaneus o Return Appointment in 1 week. Wound #4 Left Calcaneus o Return Appointment in 1 week. Edema Control Wound #1 Right Calcaneus o Elevate legs to the level of the heart and pump ankles as often as possible Wound #4 Left Calcaneus o Elevate legs to the level of the heart and pump ankles as often as possible Off-Loading Wound #1 Right Calcaneus o Other: - elevated legs on pillows Wound #4 Left Calcaneus o Other: - elevated legs on pillows Additional Orders / Instructions Wound #1 Right Calcaneus o Increase protein intake. Wound #4 Left Calcaneus o Increase protein intake. Electronic Signature(s) Signed: 10/04/2016 5:01:41 PM By: Linton Ham MD Signed: 10/04/2016 5:39:07 PM By: Montey Hora Entered By: Montey Hora on 10/04/2016 14:14:36 [name] [name], [name] [name] ([id number]) -------------------------------------------------------------------------------- Problem List Details [name] [name], Lyriq Date of Service: 10/04/2016 1:30 PM [name] Patient Account Number: [id number] Medical Record Treating RN: Montey Hora [id number] Number: Other Clinician: Date of Birth/Sex: 05-06-1931 (83 y.o. 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 Patient Account Number: [id number] Medical Record Treating RN: Montey Hora [id number] Number: Other Clinician: Date of Birth/Sex: 1931-12-07 (83 y.o. Female) Treating ROBSON, MICHAEL Primary Care Provider: LAM, LYNN Provider/Extender: G Referring Provider: LAM, LYNN Weeks in Treatment: 11 Subjective History of Present Illness (HPI) 07/19/16; this is an 83 year old woman who tells me that she has had a wound on her right heel for about a month and a half. She said she was hospitalized with postmenopausal vaginal bleeding and was hospitalized ultimately at [facility] for this. I do not have these records. Afterward she was sent to [facility] for rehabilitation. She states there she developed a wound on her foot. The patient is a type II diabetic on insulin, I'm not really sure of her diabetic control. She has well care or home health we think they are putting Silvadene cream on this. ABIs in our clinic were 0.6 bilaterally. The patient and her husband it was somewhat vague history of possible stent  placement in the right thigh 2-3 years ago although I don't see anything about this and [place] link. We will  need to research this more before we see her again next week. She did have a normal x-ray of the heel at the end of May I believe when she was seen in the ER. There was no evidence of osteomyelitis although soft tissue edema was noted. Looking through care everywhere and  link I can see no reference to vascular surgery, noninvasive vascular tests. It is possible that this was non-at the local vein and vascular Center before they became part of the network. This would be Dr. Lucky Cowboy and Dr. Delana Meyer we will check with their office 07/26/16; the patient's arterial studies were really not very good she has an ABI in the right of 0.52 and a TBI on the right of 0.34. Corresponding values on the left for better at 0.83 on the left and a TBI of 0.54. She was felt to have right mid SFA occlusion and a 50-74% long segment stenosis and diffuse 30-49% left femoral popliteal disease with an occluded left tibial peroneal trunk. In spite of this her wound on her right heel appears to be a lot better today. Smaller with a healthy granulated surface. I have research notes in [place]. The patient is indeed followed by Dr. Gwenlyn Found of cardiology for coronary artery disease status post coronary artery bypass grafting about 7 years ago. He also notes a history of PAD and procedures by Dr. dew in [place]. In spite of this the patient's wound is better today. She is nonambulatory does not complain of pain or claudication and really I don't think given everything that she requires an arteriogram or an invasive arterial evaluation. 08/01/16; in spite of her likely significant PAD the patient's wound continues to contract. As long as this is the case I am going to forego additional arterial consultation. We have been using silver alginate. She is down 0.4 cm in length today 08/08/16; patient has an appointment with Dr. Fletcher Anon today in follow-up for her PAD. Vascular studies that we did last month I think are  the cause of this. She has a new wound on her lateral right fifth toe and an unruptured blister on the medial left heel. Her major wound on the right heel is at 1.5 x 1.5 x 0.1 09/05/16; the patient was last here she had vascular studies done by Dr. Fletcher Anon. This showed a ABI on the right at 0.5 to and on the left at 0.83. TBI is on the right at 0.34 on the left at 0.54. She has been [name] [name], Khadejah C. ([id number]) subsequently seen by Dr. dew who is somewhat annoyed that we have sent her to cardiology for her studies. Nevertheless he felt that it necessary to proceed with an arteriogram for possible salvage. She has 2 remaining wounds one on the right heel one on the medial aspect of the right fifth toe which was new the last visit. She had an area over the left lateral malleolus which is resolved. 09/19/16 on evaluation today patient appears to be doing fairly well in regard to her right lower extremity wound. She still has not had her vascular procedure for stent placement due to conflict with other appointments. For that reason her wounds do appear to be healing albeit slowly. No fevers, chills, nausea, or vomiting noted at this time. 10/04/16; I have not seen this patient in about 4 weeks. She had a wound  between her right fourth and fifth toe which is healed. The other area is on the right heel laterally. Today she arrives with a new wound on the left heel medially. Also new since the last time she was here she is now in a nursing home in [place], [place] and rehabilitation. She is having arteriogram witho Stent placement by Dr. dew on September 6 Objective Constitutional Patient is hypertensive.. Pulse regular and within target range for patient.[name] Respirations regular, non-labored and within target range.. Temperature is normal and within the target range for the patient.[name] appears in no distress. Vitals Time Taken: 12:47 PM, Height: 63 in, Weight: 203 lbs, BMI: 36, Temperature:  98.4 F, Pulse: 58 bpm, Respiratory Rate: 20 breaths/min, Blood Pressure: 153/47 mmHg. Eyes Conjunctivae clear. No discharge. Respiratory Respiratory effort is easy and symmetric bilaterally. Rate is normal at rest and on room air.. Cardiovascular Pedal pulses absent bilaterally.. Edema present in both extremities.. Lymphatic Nonpalpable no popliteal or inguinal area. Psychiatric No evidence of depression, anxiety, or agitation. Calm, cooperative, and communicative. Appropriate interactions and affect.[name] [name] [name], [name] [name] ([id number]) General Notes: Wound exam; the patient has no open area between the right fourth and fifth toe in the webspace. Her area on the right lateral heel is about the same as I remember this but unfortunately she has a new area on the left medial heel at least new to this clinic. Integumentary (Hair, Skin) Wound #1 status is Open. Original cause of wound was Gradually Appeared. The wound is located on the Right Calcaneus. The wound measures 0.4cm length x 0.4cm width x 0.1cm depth; 0.126cm^2 area and 0.013cm^3 volume. There is Fat Layer (Subcutaneous Tissue) Exposed exposed. There is no tunneling or undermining noted. There is a large amount of serous drainage noted. Foul odor after cleansing was noted. The wound margin is distinct with the outline attached to the wound base. There is large (67-100%) pale granulation within the wound bed. There is a small (1-33%) amount of necrotic tissue within the wound bed including Adherent Slough. The periwound skin appearance did not exhibit: Callus, Crepitus, Excoriation, Induration, Rash, Scarring, Dry/Scaly, Maceration, Atrophie Blanche, Cyanosis, Ecchymosis, Hemosiderin Staining, Mottled, Pallor, Rubor, Erythema. Periwound temperature was noted as No Abnormality. Wound #3 status is Healed - Epithelialized. Original cause of wound was Pressure Injury. The wound is located on the Right,Medial Toe Fifth. The wound  measures 0cm length x 0cm width x 0cm depth; 0cm^2 area and 0cm^3 volume. Wound #4 status is Open. Original cause of wound was Gradually Appeared. The wound is located on the Left Calcaneus. The wound measures 0.4cm length x 1.2cm width x 0.1cm depth; 0.377cm^2 area and 0.038cm^3 volume. There is no tunneling or undermining noted. There is a large amount of serous drainage noted. The wound margin is flat and intact. There is no granulation within the wound bed. There is a large (67-100%) amount of necrotic tissue within the wound bed including Adherent Slough. The periwound skin appearance did not exhibit: Callus, Crepitus, Excoriation, Induration, Rash, Scarring, Dry/Scaly, Maceration, Atrophie Blanche, Cyanosis, Ecchymosis, Hemosiderin Staining, Mottled, Pallor, Rubor, Erythema. Periwound temperature was noted as No Abnormality. The periwound has tenderness on palpation. 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 ([id number]) Wound #1 Right Calcaneus: Cleanse wound with mild soap and water May Shower, gently pat wound dry prior to applying new dressing. Wound #4 Left Calcaneus: Cleanse wound with mild soap and water May Shower, gently pat wound dry prior to applying new dressing. Anesthetic: Wound #1 Right Calcaneus: Topical Lidocaine 4% cream applied to wound bed prior to debridement - in clinic Wound #4 Left Calcaneus: Topical Lidocaine 4% cream applied to wound bed prior to debridement - in clinic Primary Wound Dressing: Wound #1 Right Calcaneus: Aquacel Ag Wound  #4 Left Calcaneus: Aquacel Ag Secondary Dressing: Wound #1 Right Calcaneus: Gauze and Kerlix/Conform Other - heel pads Wound #4 Left Calcaneus: Gauze and Kerlix/Conform Other - heel pads Dressing Change Frequency: Wound #1 Right Calcaneus: Change Dressing Monday, Wednesday, Friday - she comes to the wound clinic wednesdays Wound #4 Left Calcaneus: Change Dressing Monday, Wednesday, Friday - she comes to the wound clinic wednesdays Follow-up Appointments: Wound #1 Right Calcaneus: Return Appointment in 1 week. Wound #4 Left Calcaneus: Return Appointment in 1 week. Edema Control: Wound #1 Right Calcaneus: Elevate legs to the level of the heart and pump ankles as often as possible Wound #4 Left Calcaneus: Elevate legs to the level of the heart and pump ankles as often as possible Off-Loading: Wound #1 Right Calcaneus: Other: - elevated legs on pillows Wound #4 Left Calcaneus: Other: - elevated legs on pillows Additional Orders / Instructions: Wound #1 Right Calcaneus: Increase protein intake. Wound #4 Left Calcaneus: Increase protein intake. [name] [name], [name] ROCCO ([id number]) #1 we applied silver alginate to the heel wound areas bilaterally #2 heel cups, Kerlix and can't perform #3 patient is going to have a arteriogram on September 6 by Dr. dew with possible stent placement in the right leg, my understanding is she already has a stent in the left leg although I have not reviewed this history. Her daughter is present wonders whether they're going to look at the left leg as well. I told him I thought they might #4 follow-up in 2 weeks Electronic Signature(s) Signed: 10/04/2016 5:01:41 PM By: Linton Ham MD Entered By: Linton Ham on 10/04/2016 14:52:11 [name] [name], [name] [name] ([id number]) -------------------------------------------------------------------------------- SuperBill Details [name] [name], Moira Date of Service: 10/04/2016 [name] Patient  Account Number: [id number] Medical Record Treating RN: Montey Hora [id number] Number: Other Clinician: Date of Birth/Sex: Jan 10, 1932 (83 y.o. 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 Facility Procedures CPT4 Code: [id number] Description: 99214 - WOUND CARE VISIT-LEV 4 EST PT Modifier: Quantity: 1 Physician Procedures CPT4: Description Modifier Quantity Code [id number] 78938 - WC PHYS LEVEL 3 - EST PT 1 ICD-10 Description Diagnosis E11.621 Type 2 diabetes mellitus with foot ulcer 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

## 2016-10-10 ENCOUNTER — Encounter
Admission: RE | Admit: 2016-10-10 | Discharge: 2016-10-10 | Disposition: A | Discharge status: Home / Self Care | Payer: Medicare HMO | Source: Ambulatory Visit | Attending: Vascular Surgery | Admitting: Vascular Surgery

## 2016-10-10 DIAGNOSIS — E785 Hyperlipidemia, unspecified: Secondary | ICD-10-CM | POA: Diagnosis not present

## 2016-10-10 DIAGNOSIS — C55 Malignant neoplasm of uterus, part unspecified: Secondary | ICD-10-CM | POA: Diagnosis not present

## 2016-10-10 DIAGNOSIS — K5900 Constipation, unspecified: Secondary | ICD-10-CM | POA: Diagnosis not present

## 2016-10-10 DIAGNOSIS — M6281 Muscle weakness (generalized): Secondary | ICD-10-CM | POA: Diagnosis not present

## 2016-10-10 DIAGNOSIS — Z8673 Personal history of transient ischemic attack (TIA), and cerebral infarction without residual deficits: Secondary | ICD-10-CM | POA: Diagnosis not present

## 2016-10-10 DIAGNOSIS — Z794 Long term (current) use of insulin: Secondary | ICD-10-CM | POA: Diagnosis not present

## 2016-10-10 DIAGNOSIS — E119 Type 2 diabetes mellitus without complications: Secondary | ICD-10-CM | POA: Diagnosis not present

## 2016-10-10 HISTORY — DX: Other chronic pain: G89.29

## 2016-10-10 HISTORY — DX: Gastro-esophageal reflux disease without esophagitis: K21.9

## 2016-10-10 HISTORY — DX: Vitamin D deficiency, unspecified: E55.9

## 2016-10-10 HISTORY — DX: Constipation, unspecified: K59.00

## 2016-10-10 HISTORY — DX: Insomnia, unspecified: G47.00

## 2016-10-11 ENCOUNTER — Other Ambulatory Visit (INDEPENDENT_AMBULATORY_CARE_PROVIDER_SITE_OTHER): Payer: Self-pay

## 2016-10-11 ENCOUNTER — Other Ambulatory Visit: Payer: Self-pay | Admitting: *Deleted

## 2016-10-11 NOTE — Patient Outreach (Signed)
[facility]) Care Management  10/11/2016  TONIANN DICKERSON 1931/09/06 [id number]   CSW spoke with patient's daughter, [name], who confirms her mother was transferred from [facility] to [facility] in [place]. The patient's family had verbalized wishes for her to be in [place] to be closer to them; however this makes it more difficult for patient's husband to visit.  [name] now reports that her mom is at [facility] since Friday and doesn't like it as well- partially because of the facility courtyard layout and because her husband cannot drive that far.  CSW reminded daughter that with the different family member's preferred location makes it hard. I reiterated to her the importance of  Her mother completing advance directive, HCPOA documents so that the decision maker is more clear and definite.  CSW  Also discussed the need for planning for long term care payment options including Medicaid. CSW will plan SNF visit and conversation with patient later this week.      [name], MSW, [place] Worker  [place] [phone]

## 2016-10-12 ENCOUNTER — Encounter
Admission: RE | Disposition: A | Discharge status: Skilled Nursing Facility | Payer: Self-pay | Source: Ambulatory Visit | Attending: Vascular Surgery

## 2016-10-12 ENCOUNTER — Encounter: Payer: Self-pay | Admitting: *Deleted

## 2016-10-12 ENCOUNTER — Ambulatory Visit
Admission: RE | Admit: 2016-10-12 | Discharge: 2016-10-12 | Disposition: A | Discharge status: Skilled Nursing Facility | Payer: Medicare HMO | Source: Ambulatory Visit | Attending: Vascular Surgery | Admitting: Vascular Surgery

## 2016-10-12 DIAGNOSIS — E11621 Type 2 diabetes mellitus with foot ulcer: Secondary | ICD-10-CM | POA: Insufficient documentation

## 2016-10-12 DIAGNOSIS — I110 Hypertensive heart disease with heart failure: Secondary | ICD-10-CM | POA: Insufficient documentation

## 2016-10-12 DIAGNOSIS — E559 Vitamin D deficiency, unspecified: Secondary | ICD-10-CM | POA: Diagnosis not present

## 2016-10-12 DIAGNOSIS — I2510 Atherosclerotic heart disease of native coronary artery without angina pectoris: Secondary | ICD-10-CM | POA: Insufficient documentation

## 2016-10-12 DIAGNOSIS — L97529 Non-pressure chronic ulcer of other part of left foot with unspecified severity: Secondary | ICD-10-CM | POA: Insufficient documentation

## 2016-10-12 DIAGNOSIS — Z9889 Other specified postprocedural states: Secondary | ICD-10-CM | POA: Insufficient documentation

## 2016-10-12 DIAGNOSIS — E669 Obesity, unspecified: Secondary | ICD-10-CM | POA: Insufficient documentation

## 2016-10-12 DIAGNOSIS — G4700 Insomnia, unspecified: Secondary | ICD-10-CM | POA: Diagnosis not present

## 2016-10-12 DIAGNOSIS — Z833 Family history of diabetes mellitus: Secondary | ICD-10-CM | POA: Insufficient documentation

## 2016-10-12 DIAGNOSIS — I70238 Atherosclerosis of native arteries of right leg with ulceration of other part of lower right leg: Secondary | ICD-10-CM | POA: Insufficient documentation

## 2016-10-12 DIAGNOSIS — E1151 Type 2 diabetes mellitus with diabetic peripheral angiopathy without gangrene: Secondary | ICD-10-CM | POA: Diagnosis not present

## 2016-10-12 DIAGNOSIS — Z8673 Personal history of transient ischemic attack (TIA), and cerebral infarction without residual deficits: Secondary | ICD-10-CM | POA: Diagnosis not present

## 2016-10-12 DIAGNOSIS — I6529 Occlusion and stenosis of unspecified carotid artery: Secondary | ICD-10-CM | POA: Diagnosis not present

## 2016-10-12 DIAGNOSIS — G4733 Obstructive sleep apnea (adult) (pediatric): Secondary | ICD-10-CM | POA: Insufficient documentation

## 2016-10-12 DIAGNOSIS — Z823 Family history of stroke: Secondary | ICD-10-CM | POA: Insufficient documentation

## 2016-10-12 DIAGNOSIS — Z87891 Personal history of nicotine dependence: Secondary | ICD-10-CM | POA: Diagnosis not present

## 2016-10-12 DIAGNOSIS — I509 Heart failure, unspecified: Secondary | ICD-10-CM | POA: Insufficient documentation

## 2016-10-12 DIAGNOSIS — Z9049 Acquired absence of other specified parts of digestive tract: Secondary | ICD-10-CM | POA: Diagnosis not present

## 2016-10-12 DIAGNOSIS — E11622 Type 2 diabetes mellitus with other skin ulcer: Secondary | ICD-10-CM | POA: Insufficient documentation

## 2016-10-12 DIAGNOSIS — I10 Essential (primary) hypertension: Secondary | ICD-10-CM | POA: Diagnosis not present

## 2016-10-12 DIAGNOSIS — E119 Type 2 diabetes mellitus without complications: Secondary | ICD-10-CM | POA: Diagnosis not present

## 2016-10-12 DIAGNOSIS — E785 Hyperlipidemia, unspecified: Secondary | ICD-10-CM | POA: Diagnosis not present

## 2016-10-12 DIAGNOSIS — Z951 Presence of aortocoronary bypass graft: Secondary | ICD-10-CM | POA: Insufficient documentation

## 2016-10-12 HISTORY — PX: LOWER EXTREMITY ANGIOGRAPHY: CATH118251

## 2016-10-12 LAB — CREATININE, SERUM
Creatinine, Ser: 1.89 mg/dL — ABNORMAL HIGH (ref 0.44–1.00)
GFR, EST AFRICAN AMERICAN: 27 mL/min — AB (ref >60)
GFR, EST NON AFRICAN AMERICAN: 23 mL/min — AB (ref >60)

## 2016-10-12 LAB — BUN: BUN: 48 mg/dL — ABNORMAL HIGH (ref 6–20)

## 2016-10-12 LAB — GLUCOSE, CAPILLARY: Glucose-Capillary: 192 mg/dL — ABNORMAL HIGH (ref 65–99)

## 2016-10-12 SURGERY — LOWER EXTREMITY ANGIOGRAPHY
Anesthesia: Moderate Sedation | Laterality: Right

## 2016-10-12 MED ORDER — LABETALOL HCL 5 MG/ML IV SOLN: 10.0000 mg | INTRAVENOUS | Status: DC | PRN

## 2016-10-12 MED ORDER — ASPIRIN EC 81 MG PO TBEC
81.0000 mg | DELAYED_RELEASE_TABLET | Freq: Every day | ORAL | Status: DC
  Filled 2016-10-12 (×2): qty 1

## 2016-10-12 MED ORDER — SODIUM CHLORIDE 0.9% FLUSH: 3.0000 mL | INTRAVENOUS | Status: DC | PRN

## 2016-10-12 MED ORDER — SODIUM CHLORIDE 0.9 % IV SOLN: 250.0000 mL | INTRAVENOUS | Status: DC | PRN

## 2016-10-12 MED ORDER — SODIUM CHLORIDE 0.9 % IV SOLN
INTRAVENOUS | Status: DC
Start: 2016-10-12 — End: 2016-10-12
  Administered 2016-10-12: 11:00:00 via INTRAVENOUS

## 2016-10-12 MED ORDER — SODIUM CHLORIDE 0.9% FLUSH: 3.0000 mL | Freq: Two times a day (BID) | INTRAVENOUS | Status: DC

## 2016-10-12 MED ORDER — ALTEPLASE 2 MG IJ SOLR
INTRAMUSCULAR | Status: AC
  Filled 2016-10-12: qty 4

## 2016-10-12 MED ORDER — FENTANYL CITRATE (PF) 100 MCG/2ML IJ SOLN
INTRAMUSCULAR | Status: AC
  Filled 2016-10-12: qty 2

## 2016-10-12 MED ORDER — HEPARIN SODIUM (PORCINE) 1000 UNIT/ML IJ SOLN
INTRAMUSCULAR | Status: AC
  Filled 2016-10-12: qty 1

## 2016-10-12 MED ORDER — LIDOCAINE-EPINEPHRINE (PF) 2 %-1:200000 IJ SOLN
INTRAMUSCULAR | Status: AC
  Filled 2016-10-12: qty 20

## 2016-10-12 MED ORDER — HEPARIN SODIUM (PORCINE) 1000 UNIT/ML IJ SOLN
INTRAMUSCULAR | Status: DC | PRN
  Administered 2016-10-12: 5000 [IU] via INTRAVENOUS
  Administered 2016-10-12: 3000 [IU] via INTRAVENOUS

## 2016-10-12 MED ORDER — ONDANSETRON HCL 4 MG/2ML IJ SOLN: 4.0000 mg | Freq: Once | INTRAMUSCULAR | Status: DC

## 2016-10-12 MED ORDER — MIDAZOLAM HCL 2 MG/2ML IJ SOLN
INTRAMUSCULAR | Status: DC | PRN
  Administered 2016-10-12 (×2): 1 mg via INTRAVENOUS
  Administered 2016-10-12: 2 mg via INTRAVENOUS

## 2016-10-12 MED ORDER — CLOPIDOGREL BISULFATE 75 MG PO TABS: 75.0000 mg | ORAL_TABLET | Freq: Every day | ORAL | 11 refills | Status: DC

## 2016-10-12 MED ORDER — STERILE WATER FOR INJECTION IJ SOLN
INTRAMUSCULAR | Status: AC
Start: 2016-10-12 — End: ?
  Filled 2016-10-12: qty 20

## 2016-10-12 MED ORDER — MIDAZOLAM HCL 2 MG/2ML IJ SOLN
INTRAMUSCULAR | Status: AC
  Filled 2016-10-12: qty 2

## 2016-10-12 MED ORDER — HYDRALAZINE HCL 20 MG/ML IJ SOLN: 5.0000 mg | INTRAMUSCULAR | Status: DC | PRN

## 2016-10-12 MED ORDER — SODIUM CHLORIDE 0.9 % IV SOLN: INTRAVENOUS | Status: DC

## 2016-10-12 MED ORDER — ALTEPLASE 2 MG IJ SOLR
INTRAMUSCULAR | Status: DC | PRN
  Administered 2016-10-12: 4 mg

## 2016-10-12 MED ORDER — FENTANYL CITRATE (PF) 100 MCG/2ML IJ SOLN
INTRAMUSCULAR | Status: DC | PRN
  Administered 2016-10-12: 50 ug via INTRAVENOUS
  Administered 2016-10-12: 25 ug via INTRAVENOUS
  Administered 2016-10-12: 50 ug via INTRAVENOUS

## 2016-10-12 MED ORDER — HYDROMORPHONE HCL 1 MG/ML IJ SOLN: 1.0000 mg | INTRAMUSCULAR | Status: DC | PRN

## 2016-10-12 MED ORDER — CEFAZOLIN SODIUM-DEXTROSE 2-4 GM/100ML-% IV SOLN
2.0000 g | Freq: Once | INTRAVENOUS | Status: AC
Start: 2016-10-12 — End: 2016-10-12
  Administered 2016-10-12: 2 g via INTRAVENOUS
  Filled 2016-10-12: qty 100

## 2016-10-12 MED ORDER — IOPAMIDOL (ISOVUE-300) INJECTION 61%
INTRAVENOUS | Status: DC | PRN
  Administered 2016-10-12: 95 mL via INTRAVENOUS

## 2016-10-12 SURGICAL SUPPLY — 26 items
BALLN LUTONIX 4X150X130 (BALLOONS) ×3
BALLN LUTONIX 5X150X130 (BALLOONS) ×6
BALLOON LUTONIX 4X150X130 (BALLOONS) IMPLANT
BALLOON LUTONIX 5X150X130 (BALLOONS) IMPLANT
CANISTER PENUMBRA MAX (MISCELLANEOUS) ×1 IMPLANT
CATH BEACON 5 .038 100 VERT TP (CATHETERS) ×2 IMPLANT
CATH CXI SUPP ANG 4FR 135 (MICROCATHETER) IMPLANT
CATH CXI SUPP ANG 4FR 135CM (MICROCATHETER) ×3
CATH INDIGO 6 ST-TIP 135CM (CATHETERS) ×2 IMPLANT
CATH INDIGO SEP 6 (CATHETERS) ×2 IMPLANT
CATH PIG 70CM (CATHETERS) ×2 IMPLANT
COVER PROBE U/S 5X48 (MISCELLANEOUS) ×2 IMPLANT
DEVICE PRESTO INFLATION (MISCELLANEOUS) ×2 IMPLANT
DEVICE STARCLOSE SE CLOSURE (Vascular Products) ×2 IMPLANT
GLIDEWIRE ADV .035X180CM (WIRE) ×2 IMPLANT
GUIDEWIRE PFTE-COATED .018X300 (WIRE) ×2 IMPLANT
PACK ANGIOGRAPHY (CUSTOM PROCEDURE TRAY) ×3 IMPLANT
SHEATH ANL2 6FRX45 HC (SHEATH) ×2 IMPLANT
SHEATH BRITE TIP 5FRX11 (SHEATH) ×2 IMPLANT
SHEATH BRITE TIP 7FRX11 (SHEATH) ×2 IMPLANT
SHEATH PINNACLE ST 6F 45CM (SHEATH) ×2 IMPLANT
SYR MEDRAD MARK V 150ML (SYRINGE) ×2 IMPLANT
TUBING ASPIRATION INDIGO (MISCELLANEOUS) ×1 IMPLANT
TUBING CONTRAST HIGH PRESS 72 (TUBING) ×2 IMPLANT
WIRE J 3MM .035X145CM (WIRE) ×2 IMPLANT
WIRE MAGIC TORQUE 260C (WIRE) ×2 IMPLANT

## 2016-10-12 NOTE — Op Note (Signed)
[facility] Percutaneous Study/Intervention Procedural Note   Date of Surgery: 10/12/2016  Surgeon(s):Caliann Leckrone   Assistants:none  Pre-operative Diagnosis: PAD with ulceration RLE  Post-operative diagnosis: Same  Procedure(s) Performed: 1. Ultrasound guidance for vascular access left femoral artery 2. Catheter placement into right PT artery and right AT artery from left femoral approach 3. Aortogram and selective right lower extremity angiogram 4. Percutaneous transluminal angioplasty of popliteal artery with 4 mm x 15 cm Lutonix drug coated balloon and percutaneous transluminal angioplasty of the right SFA with 5 mm diameter by 15 cm length Lutonix drug-coated angioplasty balloon 5. catheter directed thrombolytic therapy with 4 mg TPA to the right SFA and popliteal artery  6.  Mechanical thrombectomy to the right SFA and popliteal artery with the penumbra cat 6 device 7. StarClose closure device left femoral artery  EBL: 5 cc  Contrast: 95 cc  Fluoro Time: 21.2 minutes  Moderate Conscious Sedation Time: approximately 75 minutes using 4 mg of Versed and 125 mcg of Fentanyl  Indications: [name] with non-healing ulcerations of the right leg. The patient has noninvasive study showing reduced perfusion. The patient is brought in for angiography for further evaluation and potential treatment. Risks and benefits are discussed and informed consent is obtained  Procedure: The patient was identified and appropriate procedural time out was performed. The patient was then placed supine on the table and prepped and draped in the usual sterile fashion.Moderate conscious sedation was administered during a face to face encounter with the patient throughout the procedure with my supervision of the RN administering medicines and monitoring the patient's vital  signs, pulse oximetry, telemetry and mental status throughout from the start of the procedure until the patient was taken to the recovery room. Ultrasound was used to evaluate the left common femoral artery. It was patent . A digital ultrasound image was acquired. A Seldinger needle was used to access the left common femoral artery under direct ultrasound guidance and a permanent image was performed. A 0.035 J wire was advanced without resistance and a 5Fr sheath was placed. Pigtail catheter was placed into the aorta and an AP aortogram was performed. This demonstrated normal renal arteries and normal aorta and iliac segments without significant stenosis. I then crossed the aortic bifurcation and advanced to the right femoral head. Selective right lower extremity angiogram was then performed. This demonstrated proximal SFA is reasonably normal. The mid SFA had a focal 50-60% stenosis.  The distal SFA and popliteal artery had 75-80% stenosis.  The tibioperoneal trunk was then occluded with reconstitution of the peroneal artery in the proximal segment and the posterior tibial artery in the distal segment. The anterior tibial artery occluded after a short segment and poor distal reconstitution was seen. The patient was systemically heparinized and a 6 [place] Ansell sheath was then placed over the [employer] wire. I then used a Kumpe catheter and the advantage wire to cross the SFA and popliteal stenoses and get down into the tibioperoneal trunk. A 0.018 advantage wire and CXI catheter and then used to cross tibioperoneal trunk and get into the posterior tibial artery. Despite multiple attempts with the 0.018 advantage wire as well as an 0.035 wire and a Kumpe catheter I could never cross the entirety of the posterior tibial artery occlusion and gain intraluminal access to the foot. Further attempts to get into the peroneal artery were unsuccessful. The wire Going into the proximal anterior tibial artery as  well, so an attempt  at crossing the anterior tibial artery occlusion was performed. Although I was able to get into the anterior tibial artery in the distal calf area, the vessel remained occluded below the ankle and there was no runoff into the foot that would be of benefit. I then replaced a 0.035 wire and turned my attention to the popliteal and SFA. a 4 mm diameter by 15 cm length Lutonix drug-coated angioplasty balloon was inflated from just below the knee and the popliteal artery up to Hunter's canal. This inflation was 12 atm for 1 minute. A 5 mm diameter by 15 cm length Lutonix drug-coated angioplasty balloon was then used in the mid and distal SFA and inflated to 10 atm for 1 minute. On completion imaging, the SFA and popliteal artery were thrombosed. There was some question whether or not she got 5000 units of heparin as at some point her IV infiltrated in her arm. I then gave 3000 additional units of intravenous heparin through the catheter, and proceeded with catheter directed thrombolytic therapy and thrombectomy of the right SFA and popliteal artery. 4 mg of TPA were delivered with the penumbra cat 6 device throughout the popliteal artery in the distal SFA in the area of thrombosis. After this was allowed to dwell for several minutes mechanical thrombectomy with the penumbra 6 device was used. Several passes were made with the thrombectomy device and a significant amount of thrombus was returned. Completion angiogram then showed in-line flow down to her tibioperoneal occlusion with no greater than 30% residual stenosis in the SFA or popliteal arteries and no obvious residual thrombosis remaining. I elected to terminate the procedure. The sheath was removed and StarClose closure device was deployed in the left femoral artery with excellent hemostatic result. The patient was taken to the recovery room in stable condition having tolerated the procedure well.  Findings:  Aortogram: normal  renal arteries, normal aorta and iliac arteries Right Lower Extremity: proximal SFA is reasonably normal. The mid SFA had a focal 50-60% stenosis.  The distal SFA and popliteal artery had 75-80% stenosis.  The tibioperoneal trunk was then occluded with reconstitution of the peroneal artery in the proximal segment and the posterior tibial artery in the distal segment. The anterior tibial artery occluded after a short segment and poor distal reconstitution was seen.   Disposition: Patient was taken to the recovery room in stable condition having tolerated the procedure well.  Complications: None  [name] [name] 10/12/2016 1:28 PM   This note was created with Dragon Medical transcription system. Any errors in dictation are purely unintentional.

## 2016-10-12 NOTE — H&P (Addendum)
[facility] Admission History & Physical  MRN : [id number]  [name] [name] is a 83 y.o. (10-May-1931) female who presents with chief complaint of No chief complaint on file. [name]  History of Present Illness: Patient with non healing ulceration and PAD of the right leg.  Seen in July and has considered intervention versus medical management. Symptoms have slowly progressed and she has decided to proceed with intervention. No new symptoms.  No fever or chills. Does have ulcers on left foot as well.  Current Facility-Administered Medications  Medication Dose Route Frequency Provider Last Rate Last Dose  . 0.9 %  sodium chloride infusion  250 mL Intravenous PRN Algernon Huxley, MD      . ceFAZolin (ANCEF) IVPB 2g/100 mL premix  2 g Intravenous Once Algernon Huxley, MD      . HYDROmorphone (DILAUDID) injection 1 mg  1 mg Intravenous Q1H PRN Algernon Huxley, MD      . ondansetron (ZOFRAN) injection 4 mg  4 mg Intravenous Once Algernon Huxley, MD      . sodium chloride flush (NS) 0.9 % injection 3 mL  3 mL Intravenous Q12H Mahi Zabriskie, Erskine Squibb, MD      . sodium chloride flush (NS) 0.9 % injection 3 mL  3 mL Intravenous PRN Algernon Huxley, MD        Past Medical History:  Diagnosis Date  . CAD (coronary artery disease) 2008   a. s/p CABG in 2008 with LIMA-LAD, SVG-D1-OM1, and SVG-PDA  . Cancer ([place])   . CHF (congestive heart failure) ([place])   . Claudication in peripheral vascular disease ([place]) 05/2012    ABI right 0.81; left 0.98   . Constipation   . CVA (cerebral infarction) 2014  . Daytime somnolence     hoping CPAP will be able to help this  . Diabetes mellitus type 2 with complications, uncontrolled (HCC)    CAD, CVA  . GERD (gastroesophageal reflux disease)   . H/O cardiovascular stress test 06/2012   Stable with lateral scar and moderate. Infarction ischemia  . H/O gastritis   . HTN (hypertension)   . Hyperlipidemia LDL goal < 70   . Insomnia   . Mallory-Weiss tear    History of  . Obesity (BMI 30.0-34.9)   . OSA on CPAP 2014    patient is just now beginning CPAP  . Pain, chronic   . Vitamin D deficiency     Past Surgical History:  Procedure Laterality Date  . ABDOMINAL AORTOGRAM W/LOWER EXTREMITY N/A 08/16/2016   Procedure: Abdominal Aortogram w/Lower Extremity;  Surgeon: Wellington Hampshire, MD;  Location: [facility];  Service: Cardiovascular;  Laterality: N/A;  . CHOLECYSTECTOMY    . CORONARY ARTERY BYPASS GRAFT  12/31/2006   CABG x4, LIMA-LAD, sequential VG-diagonal branch of LAD, OM of left circumflex & PLA branch of RCA  . EYE SURGERY     bilateral  . HYSTEROSCOPY N/A     Social History Social History  Substance Use Topics  . Smoking status: Former Smoker    Quit date: 10/11/1966  . Smokeless tobacco: Never Used     Comment: 50 years ago  . Alcohol use No  No IVDU  Family History Family History  Problem Relation Age of Onset  . Stroke Mother   . Diabetes Sister   . Diabetes Brother   no bleeding or clotting disorders  No Known Allergies   REVIEW OF SYSTEMS (Negative unless checked)  Constitutional: [] Weight loss  [] Fever  [] Chills Cardiac: [] Chest pain   [] Chest pressure   [x] Palpitations   [] Shortness of breath when laying flat   [] Shortness of breath at rest   [] Shortness of breath with exertion. Vascular:  [] Pain in legs with walking   [] Pain in legs at rest   [] Pain in legs when laying flat   [x] Claudication   [x] Pain in feet when walking  [] Pain in feet at rest  [] Pain in feet when laying flat   [] History of DVT   [] Phlebitis   [] Swelling in legs   [] Varicose veins   [x] Non-healing ulcers Pulmonary:   [] Uses home oxygen   [] Productive cough   [] Hemoptysis   [] Wheeze  [] COPD   [] Asthma Neurologic:  [] Dizziness  [] Blackouts   [] Seizures   [x] History of stroke   [] History of TIA  [] Aphasia   [] Temporary blindness   [] Dysphagia   [] Weakness or numbness in arms   [] Weakness or numbness in legs Musculoskeletal:  [x] Arthritis    [] Joint swelling   [] Joint pain   [] Low back pain Hematologic:  [] Easy bruising  [] Easy bleeding   [] Hypercoagulable state   [] Anemic  [] Hepatitis Gastrointestinal:  [] Blood in stool   [] Vomiting blood  [x] Gastroesophageal reflux/heartburn   [] Difficulty swallowing. Genitourinary:  [x] Chronic kidney disease   [] Difficult urination  [] Frequent urination  [] Burning with urination   [] Blood in urine Skin:  [] Rashes   [x] Ulcers   [x] Wounds Psychological:  [] History of anxiety   []  History of major depression.  Physical Examination  Vitals:   10/12/16 1015  BP: (!) 188/72  Pulse: 64  SpO2: 99%   There is no height or weight on file to calculate BMI. Gen: WD/WN, NAD Head: [place]/AT, No temporalis wasting.  Ear/Nose/Throat: Hearing grossly intact, nares w/o erythema or drainage, oropharynx w/o Erythema/Exudate,  Eyes: Conjunctiva clear, sclera non-icteric Neck: Trachea midline.  No JVD.  Pulmonary:  Good air movement, respirations not labored, no use of accessory muscles.  Cardiac: RRR, normal S1, S2. Vascular:  Vessel Right Left  Radial Palpable Palpable                          PT Trace Palpable Trace Palpable  DP Not Palpable 1+ Palpable    Musculoskeletal: M/S 5/5 throughout.  1+ BLE edema. Several ulcerations on the toes bilaterally and the right heel. Neurologic: Sensation grossly intact in extremities.  Symmetrical.  Speech is fluent. Motor exam as listed above. Psychiatric: Judgment intact, Mood & affect appropriate for pt's clinical situation. Dermatologic: open wounds clean and dry.      CBC Lab Results  Component Value Date   WBC 11.2 (H) 08/08/2016   HGB 12.7 08/08/2016   HCT 38.2 08/08/2016   MCV 83.1 08/08/2016   PLT 238 08/08/2016    BMET    Component Value Date/Time   NA 138 08/08/2016 1737   NA 149 (H) 12/11/2014 1005   NA 137 05/15/2013 1646   K 3.6 08/08/2016 1737   K 4.1 05/15/2013 1646   CL 102 08/08/2016 1737   CL 104 05/15/2013 1646   CO2  28 08/08/2016 1737   CO2 28 05/15/2013 1646   GLUCOSE 209 (H) 08/08/2016 1737   GLUCOSE 189 (H) 05/15/2013 1646   BUN 20 08/08/2016 1737   BUN 22 12/11/2014 1005   BUN 36 (H) 05/15/2013 1646   CREATININE 1.17 (H) 08/08/2016 1737   CREATININE 1.64 (H) 05/15/2013 1646   CALCIUM 9.2 08/08/2016 1737  CALCIUM 9.1 05/15/2013 1646   GFRNONAA 42 (L) 08/08/2016 1737   GFRNONAA 29 (L) 05/15/2013 1646   GFRAA 49 (L) 08/08/2016 1737   GFRAA 34 (L) 05/15/2013 1646   CrCl cannot be calculated (Patient's most recent lab result is older than the maximum 21 days allowed.).  COAG Lab Results  Component Value Date   INR 1.05 08/08/2016   INR 1.4 12/31/2006   INR 0.9 12/27/2006    Radiology No results found.    Assessment/Plan 1. PAD with ulceration RLE. Patient decided to proceed with angiogram after several weeks of consideration.  Risks and benefits discussed. 2. Carotid stenosis. Stable. Seen on last check about 6 weeks ago.   3. HTN. Stable on outpatient medications and blood pressure control important in reducing the progression of atherosclerotic disease. On appropriate oral medications. 4. DM. Stable on outpatient medications and blood glucose control important in reducing the progression of atherosclerotic disease. Also, involved in wound healing. On appropriate medications.     Leotis Pain, MD  10/12/2016 10:30 AM

## 2016-10-12 NOTE — OR Nursing (Signed)
md notified iv meds hard to push, possible infiltration, duration unknown. Dr Lucky Cowboy gave 3000 units heparin via arterial sheath. However pt has shown signs of sedation after doses of fentanyl and versed throughout case. Site around IV firm to palpation unable to visualize due to drapping.

## 2016-10-12 NOTE — OR Nursing (Signed)
md notified of lab results. No fluid bolus given due to pt history of CABG, and 3-4 generalized pitting edema.

## 2016-10-13 ENCOUNTER — Encounter: Payer: Self-pay | Admitting: Vascular Surgery

## 2016-10-13 ENCOUNTER — Other Ambulatory Visit: Payer: Self-pay | Admitting: *Deleted

## 2016-10-14 NOTE — Patient Outreach (Signed)
[facility]) Care Management  [facility] Social Work  10/14/2016  [name] [name] September 29, 1931 [id number]  Subjective:  Patient sitting outside with family at [facility] SNF rehab.   Objective: CSW to assist patient with commuity based resources to aide in her well-being, quality of life and overall safety/needs.    Encounter Medications:  Outpatient Encounter Prescriptions as of 10/13/2016  Medication Sig Note  . ACCU-CHEK SOFTCLIX LANCETS lancets CHECK BLOOD SUGARS TWICE DAILY   . acetaminophen (TYLENOL) 325 MG tablet Take 650 mg by mouth every 6 (six) hours as needed for mild pain.   [name] aspirin 81 MG tablet Take 81 mg by mouth daily.   . bisacodyl (DULCOLAX) 5 MG EC tablet Take 5 mg by mouth daily as needed for moderate constipation.   . carvedilol (COREG) 6.25 MG tablet Take 6.25 mg by mouth 2 (two) times daily with a meal.   . Cholecalciferol 50000 units TABS Take 1 tablet by mouth once.   . clopidogrel (PLAVIX) 75 MG tablet Take 1 tablet (75 mg total) by mouth daily.   . furosemide (LASIX) 40 MG tablet Take 80 mg by mouth 2 (two) times daily.   . hydroxypropyl methylcellulose / hypromellose (ISOPTO TEARS / GONIOVISC) 2.5 % ophthalmic solution Place 1 drop into both eyes as needed for dry eyes.   . insulin lispro (HUMALOG) 100 UNIT/ML injection Inject 3-10 Units into the skin 4 (four) times daily -  with meals and at bedtime.   . Insulin NPH Isophane & Regular (NOVOLIN 70/30 ) Inject 60 Units into the skin 3 (three) times daily with meals. 50 units every morning  50 units every evening 08/02/2016: Taking 56 units in the morning and evening   . Insulin Pen Needle (RELION SHORT PEN NEEDLES) 31G X 8 MM MISC 1 each by Does not apply route 2 (two) times daily.   . megestrol (MEGACE) 400 MG/10ML suspension Take 400 mg by mouth 2 (two) times daily.   . Melatonin 5 MG TABS Take 1 tablet by mouth at bedtime.   . polyethylene glycol (MIRALAX / GLYCOLAX) packet USE 1-2  PACKETS DAILY   . pravastatin (PRAVACHOL) 40 MG tablet TAKE 1 TABLET BY MOUTH AT BEDTIME   . ranitidine (ZANTAC) 150 MG tablet Take 150 mg by mouth 2 (two) times daily.   [name] senna (SENOKOT) 8.6 MG TABS tablet Take 2 tablets by mouth at bedtime.    No facility-administered encounter medications on file as of 10/13/2016.     Functional Status:  In your present state of health, do you have any difficulty performing the following activities: 10/10/2016 09/07/2016  Hearing? N N  Vision? N -  Difficulty concentrating or making decisions? Y Y  Comment - family assist   Walking or climbing stairs? Y Y  Dressing or bathing? Y Y  Comment - has personal care providers  Doing errands, shopping? - Y  Preparing Food and eating ? - Y  Using the Toilet? - Y  In the past six months, have you accidently leaked urine? - Y  Comment - wears depends  Do you have problems with loss of bowel control? - N  Managing your Medications? - Y  Managing your Finances? - Y  Comment - family helps  Housekeeping or managing your Housekeeping? - Y  Comment - has personal care help  Some recent data might be hidden    Fall/Depression Screening:  PHQ 2/9 Scores 09/07/2016 06/21/2016 05/26/2016 04/05/2016 12/28/2015 11/22/2015 11/18/2015  PHQ -  2 Score 1 1 1 1  0 1 1    Assessment:   CSW visited patient at SNF rehab on 10/13/16. Patient was sitting outside in the courtyard visiting with family. CSW asked patient if she was ok talking with the family members (sister in law and brother, daughter, [name] [name] and son, [name] [name]) to which she was. Patient signed an updated consent form allowing [place] to speak with all 4 of her children, husband, a Psychiatrist and a stepson.  CSW spent a long time talking to patient and her children in depth about the need for a long term plan for SNF payment. Per family and SNF rep, her insurance has indicated her last day of coverage is Sunday, 9/9, however they have appealed the decision and are awaiting and  hopeful for an extension based on the surgery she had (stints).   CSW talked in depth and educated and counseled patient and her 2 children for over an hour regarding the patient's income, property, assets and what appears to be a transfer of property (to one of her sons) 4 years ago that may make her ineligible for Medicaid for another year. Patient and the 2 children visiting seem aware of this and understand the dilemma. Patient agrees to me contacting her other 2 children to discuss the financial dilemma the patient has in regards to not being eligible for  Medicaid and the needs that she has for long term care in SNF or in the home.    Plan: CSW will plan call to family on Monday.    [name], MSW, [place] Worker  [place] [phone]

## 2016-10-16 ENCOUNTER — Other Ambulatory Visit: Payer: Self-pay | Admitting: *Deleted

## 2016-10-17 ENCOUNTER — Encounter: Payer: Medicare HMO | Attending: Internal Medicine | Admitting: Internal Medicine

## 2016-10-17 DIAGNOSIS — E11621 Type 2 diabetes mellitus with foot ulcer: Secondary | ICD-10-CM | POA: Diagnosis not present

## 2016-10-17 DIAGNOSIS — I2510 Atherosclerotic heart disease of native coronary artery without angina pectoris: Secondary | ICD-10-CM | POA: Diagnosis not present

## 2016-10-17 DIAGNOSIS — M1990 Unspecified osteoarthritis, unspecified site: Secondary | ICD-10-CM | POA: Insufficient documentation

## 2016-10-17 DIAGNOSIS — E119 Type 2 diabetes mellitus without complications: Secondary | ICD-10-CM | POA: Diagnosis not present

## 2016-10-17 DIAGNOSIS — Z8673 Personal history of transient ischemic attack (TIA), and cerebral infarction without residual deficits: Secondary | ICD-10-CM | POA: Diagnosis not present

## 2016-10-17 DIAGNOSIS — C55 Malignant neoplasm of uterus, part unspecified: Secondary | ICD-10-CM | POA: Diagnosis not present

## 2016-10-17 DIAGNOSIS — I129 Hypertensive chronic kidney disease with stage 1 through stage 4 chronic kidney disease, or unspecified chronic kidney disease: Secondary | ICD-10-CM | POA: Insufficient documentation

## 2016-10-17 DIAGNOSIS — D649 Anemia, unspecified: Secondary | ICD-10-CM | POA: Diagnosis not present

## 2016-10-17 DIAGNOSIS — L97411 Non-pressure chronic ulcer of right heel and midfoot limited to breakdown of skin: Secondary | ICD-10-CM | POA: Insufficient documentation

## 2016-10-17 DIAGNOSIS — E1151 Type 2 diabetes mellitus with diabetic peripheral angiopathy without gangrene: Secondary | ICD-10-CM | POA: Insufficient documentation

## 2016-10-17 DIAGNOSIS — I872 Venous insufficiency (chronic) (peripheral): Secondary | ICD-10-CM | POA: Diagnosis not present

## 2016-10-17 DIAGNOSIS — K219 Gastro-esophageal reflux disease without esophagitis: Secondary | ICD-10-CM | POA: Diagnosis not present

## 2016-10-17 DIAGNOSIS — J449 Chronic obstructive pulmonary disease, unspecified: Secondary | ICD-10-CM | POA: Diagnosis not present

## 2016-10-17 DIAGNOSIS — N183 Chronic kidney disease, stage 3 (moderate): Secondary | ICD-10-CM | POA: Insufficient documentation

## 2016-10-17 DIAGNOSIS — E1142 Type 2 diabetes mellitus with diabetic polyneuropathy: Secondary | ICD-10-CM | POA: Diagnosis not present

## 2016-10-17 DIAGNOSIS — E1122 Type 2 diabetes mellitus with diabetic chronic kidney disease: Secondary | ICD-10-CM | POA: Diagnosis not present

## 2016-10-17 DIAGNOSIS — Z794 Long term (current) use of insulin: Secondary | ICD-10-CM | POA: Diagnosis not present

## 2016-10-17 DIAGNOSIS — L97421 Non-pressure chronic ulcer of left heel and midfoot limited to breakdown of skin: Secondary | ICD-10-CM | POA: Diagnosis not present

## 2016-10-17 DIAGNOSIS — K5900 Constipation, unspecified: Secondary | ICD-10-CM | POA: Diagnosis not present

## 2016-10-17 DIAGNOSIS — S91302A Unspecified open wound, left foot, initial encounter: Secondary | ICD-10-CM | POA: Diagnosis not present

## 2016-10-17 DIAGNOSIS — I10 Essential (primary) hypertension: Secondary | ICD-10-CM | POA: Diagnosis not present

## 2016-10-17 DIAGNOSIS — S91301A Unspecified open wound, right foot, initial encounter: Secondary | ICD-10-CM | POA: Diagnosis not present

## 2016-10-17 DIAGNOSIS — E785 Hyperlipidemia, unspecified: Secondary | ICD-10-CM | POA: Diagnosis not present

## 2016-10-19 NOTE — Progress Notes (Signed)
[name], [name] ([id number]) Visit Report for 10/17/2016 Arrival Information Details [name] [name] Date of Service: 10/17/2016 12:30 PM [name] Patient Account Number: [id number] Medical Record Treating RN: Ahmed Prima [id number] Number: Other Clinician: Date of Birth/Sex: September 03, 1931 (83 y.o. Female) Treating ROBSON, MICHAEL Primary Care Dianara Smullen: LAM, LYNN Zaraya Delauder/Extender: G Referring Shon Mansouri: LAM, LYNN Weeks in Treatment: 12 Visit Information History Since Last Visit All ordered tests and consults were completed: No Patient Arrived: Wheel Chair Added or deleted any medications: No Arrival Time: 12:37 Any new allergies or adverse reactions: No Accompanied By: self Had a fall or experienced change in No Transfer Assistance: EasyP[employer] activities of daily living that may affect Patient Lift risk of falls: Patient Identification Verified: Yes Signs or symptoms of abuse/neglect since last No Secondary Verification Process Yes visito Completed: Hospitalized since last visit: No Patient Requires Transmission- No Has Dressing in Place as Prescribed: Yes Based Precautions: Pain Present Now: No Patient Has Alerts: No Electronic Signature(s) Signed: 10/18/2016 4:54:17 PM By: Alric Quan Entered By: Alric Quan on 10/17/2016 12:39:11 [name] [name] ([id number]) -------------------------------------------------------------------------------- Clinic Level of Care Assessment Details [name], [name] Date of Service: 10/17/2016 12:30 PM [name] Patient Account Number: [id number] Medical Record Treating RN: Ahmed Prima [id number] Number: Other Clinician: Date of Birth/Sex: [birth year] (83 y.o. Female) Treating ROBSON, [place] Primary Care Severus Brodzinski: LAM, LYNN Talitha Dicarlo/Extender: G Referring Marysue Fait: LAM, LYNN Weeks in Treatment: 12 Clinic Level of Care Assessment Items TOOL 4 Quantity Score X - Use when only an EandM is  performed on FOLLOW-UP visit 1 0 ASSESSMENTS - Nursing Assessment / Reassessment X - Reassessment of Co-morbidities (includes updates in patient status) 1 10 X - Reassessment of Adherence to Treatment Plan 1 5 ASSESSMENTS - Wound and Skin Assessment / Reassessment []  - Simple Wound Assessment / Reassessment - one wound 0 X - Complex Wound Assessment / Reassessment - multiple wounds 2 5 []  - Dermatologic / Skin Assessment (not related to wound area) 0 ASSESSMENTS - Focused Assessment []  - Circumferential Edema Measurements - multi extremities 0 []  - Nutritional Assessment / Counseling / Intervention 0 []  - Lower Extremity Assessment (monofilament, tuning fork, pulses) 0 []  - Peripheral Arterial Disease Assessment (using hand held doppler) 0 ASSESSMENTS - Ostomy and/or Continence Assessment and Care []  - Incontinence Assessment and Management 0 []  - Ostomy Care Assessment and Management (repouching, etc.) 0 PROCESS - Coordination of Care X - Simple Patient / Family Education for ongoing care 1 15 []  - Complex (extensive) Patient / Family Education for ongoing care 0 []  - Staff obtains Programmer, systems, Records, Test Results / Process Orders 0 []  - Staff telephones HHA, Nursing Homes / Clarify orders / etc 0 [name] [name] REISNER. ([id number]) []  - Routine Transfer to another Facility (non-emergent condition) 0 []  - Routine Hospital Admission (non-emergent condition) 0 []  - New Admissions / Biomedical engineer / Ordering NPWT, Apligraf, etc. 0 []  - Emergency Hospital Admission (emergent condition) 0 X - Simple Discharge Coordination 1 10 []  - Complex (extensive) Discharge Coordination 0 PROCESS - Special Needs []  - Pediatric / Minor Patient Management 0 []  - Isolation Patient Management 0 []  - Hearing / Language / Visual special needs 0 []  - Assessment of Community assistance (transportation, D/C planning, etc.) 0 []  - Additional assistance / Altered mentation 0 []  - Support Surface(s)  Assessment (bed, cushion, seat, etc.) 0 INTERVENTIONS - Wound Cleansing / Measurement []  - Simple Wound Cleansing - one wound 0 X - Complex Wound Cleansing - multiple wounds 2 5  X - Wound Imaging (photographs - any number of wounds) 1 5 []  - Wound Tracing (instead of photographs) 0 []  - Simple Wound Measurement - one wound 0 []  - Complex Wound Measurement - multiple wounds 0 INTERVENTIONS - Wound Dressings []  - Small Wound Dressing one or multiple wounds 0 []  - Medium Wound Dressing one or multiple wounds 0 []  - Large Wound Dressing one or multiple wounds 0 []  - Application of Medications - topical 0 []  - Application of Medications - injection 0 [name] [name] C. ([id number]) INTERVENTIONS - Miscellaneous []  - External ear exam 0 []  - Specimen Collection (cultures, biopsies, blood, body fluids, etc.) 0 []  - Specimen(s) / Culture(s) sent or taken to Lab for analysis 0 []  - Patient Transfer (multiple staff / Harrel Lemon Lift / Similar devices) 0 []  - Simple Staple / Suture removal (25 or less) 0 []  - Complex Staple / Suture removal (26 or more) 0 []  - Hypo / Hyperglycemic Management (close monitor of Blood Glucose) 0 []  - Ankle / Brachial Index (ABI) - do not check if billed separately 0 X - Vital Signs 1 5 Has the patient been seen at the hospital within the last three years: Yes Total Score: 70 Level Of Care: New/Established - Level 2 Electronic Signature(s) Signed: 10/18/2016 4:54:17 PM By: Alric Quan Entered By: Alric Quan on 10/18/2016 14:24:03 [name] [name] ([id number]) -------------------------------------------------------------------------------- Encounter Discharge Information Details [name] [name] Date of Service: 10/17/2016 12:30 PM [name] Patient Account Number: [id number] Medical Record Treating RN: Ahmed Prima [id number] Number: Other Clinician: Date of Birth/Sex: [birth year] (83 y.o. Female) Treating ROBSON, MICHAEL Primary  Care Takuma Cifelli: LAM, LYNN Julieanne Hadsall/Extender: G Referring Madasyn Heath: LAM, LYNN Weeks in Treatment: 12 Encounter Discharge Information Items Discharge Pain Level: 0 Discharge Condition: Stable Ambulatory Status: Wheelchair Discharge Destination: Nursing Home Transportation: Other Accompanied By: self Schedule Follow-up Appointment: Yes Medication Reconciliation completed and provided to Patient/Care No Izek Corvino: Provided on Clinical Summary of Care: 10/17/2016 Form Type Recipient Paper Patient JMS Electronic Signature(s) Signed: 10/18/2016 4:54:17 PM By: Alric Quan Entered By: Alric Quan on 10/17/2016 13:17:59 [name] [name] ([id number]) -------------------------------------------------------------------------------- Lower Extremity Assessment Details [name] [name] Date of Service: 10/17/2016 12:30 PM [name] Patient Account Number: [id number] Medical Record Treating RN: Ahmed Prima [id number] Number: Other Clinician: Date of Birth/Sex: 14-Jul-1931 (83 y.o. Female) Treating ROBSON, MICHAEL Primary Care Ignatius Kloos: LAM, LYNN Shateka Petrea/Extender: G Referring Ledora Delker: LAM, LYNN Weeks in Treatment: 12 Vascular Assessment Pulses: Dorsalis Pedis Palpable: [Left:Yes] [Right:Yes] Posterior Tibial Extremity colors, hair growth, and conditions: Extremity Color: [Left:Hyperpigmented] [Right:Hyperpigmented] Temperature of Extremity: [Left:Warm] [Right:Warm] Capillary Refill: [Left:< 3 seconds] [Right:< 3 seconds] Toe Nail Assessment Left: Right: Thick: Yes Yes Discolored: Yes Yes Deformed: No No Improper Length and Hygiene: Yes Yes Electronic Signature(s) Signed: 10/18/2016 4:54:17 PM By: Alric Quan Entered By: Alric Quan on 10/17/2016 12:56:18 [name] [name] ([id number]) -------------------------------------------------------------------------------- Multi Wound Chart Details [name] [name] Date of Service:  10/17/2016 12:30 PM [name] Patient Account Number: [id number] Medical Record Treating RN: Ahmed Prima [id number] Number: Other Clinician: Date of Birth/Sex: December 12, 1931 (83 y.o. Female) Treating ROBSON, MICHAEL Primary Care Hailei Besser: LAM, LYNN Gypsy Kellogg/Extender: G Referring Iesha Summerhill: LAM, LYNN Weeks in Treatment: 12 Vital Signs Height(in): 63 Pulse(bpm): 64 Weight(lbs): 203 Blood Pressure 135/48 (mmHg): Body Mass Index(BMI): 36 Temperature(F): 98.4 Respiratory Rate 20 (breaths/min): Photos: [1:No Photos] [4:No Photos] [N/A:N/A] Wound Location: [1:Right Calcaneus] [4:Left Calcaneus] [N/A:N/A] Wounding Event: [1:Gradually Appeared] [4:Gradually Appeared] [N/A:N/A] Primary Etiology: [1:Diabetic Wound/Ulcer of the Lower Extremity] [4:To be determined] [N/A:N/A]  Date Acquired: [1:06/13/2016] [4:10/04/2016] [N/A:N/A] Weeks of Treatment: [1:12] [4:1] [N/A:N/A] Wound Status: [1:Open] [4:Open] [N/A:N/A] Measurements L x W x D 0x0x0 [4:0x0x0] [N/A:N/A] (cm) Area (cm) : [1:0] [4:0] [N/A:N/A] Volume (cm) : [1:0] [4:0] [N/A:N/A] % Reduction in Area: [1:100.00%] [4:100.00%] [N/A:N/A] % Reduction in Volume: 100.00% [4:100.00%] [N/A:N/A] Classification: [1:Grade 1] [4:Partial Thickness] [N/A:N/A] Periwound Skin Texture: No Abnormalities Noted [4:No Abnormalities Noted] [N/A:N/A] Periwound Skin [1:No Abnormalities Noted] [4:No Abnormalities Noted] [N/A:N/A] Moisture: Periwound Skin Color: No Abnormalities Noted [4:No Abnormalities Noted] [N/A:N/A] Tenderness on [1:No] [4:No] [N/A:N/A] Treatment Notes Electronic Signature(s) Signed: 10/18/2016 4:54:17 PM By: Alric Quan [name] [name] ([id number]) Entered By: Alric Quan on 10/17/2016 13:17:06 [name] [name] ([id number]) -------------------------------------------------------------------------------- [place] Details [name] [name] Date of Service: 10/17/2016 12:30  PM [name] Patient Account Number: [id number] Medical Record Treating RN: Ahmed Prima [id number] Number: Other Clinician: Date of Birth/Sex: 09/05/1931 (83 y.o. Female) Treating Dellia Nims MICHAEL Primary Care Blinda Turek: LAM, LYNN Jeriah Corkum/Extender: G Referring Reilly Molchan: LAM, LYNN Weeks in Treatment: 12 Active Inactive Electronic Signature(s) Signed: 10/18/2016 4:54:17 PM By: Alric Quan Entered By: Alric Quan on 10/17/2016 12:59:08 [name] [name] ([id number]) -------------------------------------------------------------------------------- Pain Assessment Details [name] [name] Date of Service: 10/17/2016 12:30 PM [name] Patient Account Number: [id number] Medical Record Treating RN: Ahmed Prima [id number] Number: Other Clinician: Date of Birth/Sex: 05-Dec-1931 (83 y.o. Female) Treating ROBSON, MICHAEL Primary Care Shemicka Cohrs: LAM, LYNN Edsel Shives/Extender: G Referring Kyndall Chaplin: LAM, LYNN Weeks in Treatment: 12 Active Problems Location of Pain Severity and Description of Pain Patient Has Paino No Site Locations Pain Management and Medication Current Pain Management: Electronic Signature(s) Signed: 10/18/2016 4:54:17 PM By: Alric Quan Entered By: Alric Quan on 10/17/2016 12:39:16 [name] [name] ([id number]) -------------------------------------------------------------------------------- Patient/Caregiver Education Details [name] [name] [name] Date of Service: 10/17/2016 12:30 PM [name] Patient Account Number: [id number] Medical Record Treating RN: Ahmed Prima [id number] Number: Other Clinician: Date of Birth/Gender: [birth year] (83 y.o. Female) Treating ROBSON, MICHAEL Primary Care Physician/Extender: Autumn Messing Physician: Weeks in Treatment: 12 Referring Physician: LAM, Jeani Hawking Education Assessment Education Provided To: Patient Education Topics Provided Wound/Skin Impairment: Handouts: Other:  Keep pressure off of heels when lying in bed. See MD note. Methods: Explain/Verbal Responses: State content correctly Electronic Signature(s) Signed: 10/18/2016 4:54:17 PM By: Alric Quan Entered By: Alric Quan on 10/17/2016 13:18:57 [name] [name] ([id number]) -------------------------------------------------------------------------------- Wound Assessment Details [name], Kaziah Date of Service: 10/17/2016 12:30 PM [name] Patient Account Number: [id number] Medical Record Treating RN: Ahmed Prima [id number] Number: Other Clinician: Date of Birth/Sex: 08/19/1931 (83 y.o. Female) Treating ROBSON, MICHAEL Primary Care Kiano Terrien: LAM, LYNN Edsel Shives/Extender: G Referring Wright Gravely: LAM, LYNN Weeks in Treatment: 12 Wound Status Wound Number: 1 Primary Diabetic Wound/Ulcer of the Lower Etiology: Extremity Wound Location: Right Calcaneus Wound Status: Open Wounding Event: Gradually Appeared Date Acquired: 06/13/2016 Weeks Of Treatment: 12 Clustered Wound: No Photos Photo Uploaded By: Gretta Cool, BSN, RN, CWS, Kim on 10/17/2016 15:58:12 Wound Measurements Length: (cm) 0 Width: (cm) 0 Depth: (cm) 0 Area: (cm) 0 Volume: (cm) 0 % Reduction in Area: 100% % Reduction in Volume: 100% Wound Description Classification: Grade 1 Periwound Skin Texture Texture Color No Abnormalities Noted: No No Abnormalities Noted: No Moisture No Abnormalities Noted: No Electronic Signature(s) TAQUILA, LEYS ([id number]) Signed: 10/18/2016 4:54:17 PM By: Alric Quan Entered By: Alric Quan on 10/17/2016 12:54:45 [name] [name] ([id number]) -------------------------------------------------------------------------------- Wound Assessment Details [name], Dierra Date of Service: 10/17/2016 12:30 PM [name] Patient Account Number: [id number] Medical Record Treating RN: Ahmed Prima [id number]  Number: Other Clinician: Date  of Birth/Sex: 01/29/1932 (83 y.o. Female) Treating ROBSON, MICHAEL Primary Care Tanisia Yokley: LAM, LYNN Vaness Jelinski/Extender: G Referring Calypso Hagarty: LAM, LYNN Weeks in Treatment: 12 Wound Status Wound Number: 4 Primary Etiology: To be determined Wound Location: Left Calcaneus Wound Status: Open Wounding Event: Gradually Appeared Date Acquired: 10/04/2016 Weeks Of Treatment: 1 Clustered Wound: No Photos Photo Uploaded By: Gretta Cool, BSN, RN, CWS, Kim on 10/17/2016 15:58:13 Wound Measurements Length: (cm) 0 % Reduction in Width: (cm) 0 % Reduction in Depth: (cm) 0 Area: (cm) 0 Volume: (cm) 0 Area: 100% Volume: 100% Wound Description Classification: Partial Thickness Periwound Skin Texture Texture Color No Abnormalities Noted: No No Abnormalities Noted: No Moisture No Abnormalities Noted: No Electronic Signature(s) REONNA, FINLAYSON ([id number]) Signed: 10/18/2016 4:54:17 PM By: Alric Quan Entered By: Alric Quan on 10/17/2016 12:54:46 [name] [name] ([id number]) -------------------------------------------------------------------------------- Vitals Details [name], Ismael Date of Service: 10/17/2016 12:30 PM [name] Patient Account Number: [id number] Medical Record Treating RN: Ahmed Prima [id number] Number: Other Clinician: Date of Birth/Sex: August 04, 1931 (83 y.o. Female) Treating ROBSON, MICHAEL Primary Care Judy Goodenow: LAM, LYNN Kienan Doublin/Extender: G Referring Anishka Bushard: LAM, LYNN Weeks in Treatment: 12 Vital Signs Time Taken: 12:39 Temperature (F): 98.4 Height (in): 63 Pulse (bpm): 64 Weight (lbs): 203 Respiratory Rate (breaths/min): 20 Body Mass Index (BMI): 36 Blood Pressure (mmHg): 135/48 Reference Range: 80 - 120 mg / dl Electronic Signature(s) Signed: 10/18/2016 4:54:17 PM By: Alric Quan Entered By: Alric Quan on 10/17/2016 12:40:22

## 2016-10-19 NOTE — Progress Notes (Signed)
[name] [name], [name] [name] ([id number]) Visit Report for 10/17/2016 HPI Details [name] [name], [name] [name] Date of Service: 10/17/2016 12:30 PM [name] Patient Account Number: [id number] Medical Record Treating RN: Ahmed Prima [id number] Number: Other Clinician: Date of Birth/Sex: Jun 27, 1931 (83 y.o. Female) Treating Angala Hilgers Primary Care Provider: LAM, LYNN Provider/Extender: G Referring Provider: LAM, LYNN Weeks in Treatment: 12 History of Present Illness HPI Description: Y 07/19/16; this is an 83 year old woman who tells me that she has had a wound on her right heel for about a month and a half. She said she was hospitalized with postmenopausal vaginal bleeding and was hospitalized ultimately at [facility] for this. I do not have these records. Afterward she was sent to [place] for rehabilitation. She states there she developed a wound on her foot. The patient is a type II diabetic on insulin, I'm not really sure of her diabetic control. She has well care or home health we think they are putting Silvadene cream on this. ABIs in our clinic were 0.6 bilaterally. The patient and her husband it was somewhat vague history of possible stent placement in the right thigh 2-3 years ago although I don't see anything about this and [place] link. We will need to research this more before we see her again next week. She did have a normal x-ray of the heel at the end of May I believe when she was seen in the ER. There was no evidence of osteomyelitis although soft tissue edema was noted. Looking through care everywhere and [place] link I can see no reference to vascular surgery, noninvasive vascular tests. It is possible that this was non-at the local vein and vascular Center before they became part of the network. This would be Dr. Lucky Cowboy and Dr. Delana Meyer we will check with their office 07/26/16; the patient's arterial studies were really not very good she has an ABI in the right of  0.52 and a TBI on the right of 0.34. Corresponding values on the left for better at 0.83 on the left and a TBI of 0.54. She was felt to have right mid SFA occlusion and a 50-74% long segment stenosis and diffuse 30-49% left femoral popliteal disease with an occluded left tibial peroneal trunk. In spite of this her wound on her right heel appears to be a lot better today. Smaller with a healthy granulated surface. I have research notes in [place]. The patient is indeed followed by Dr. Gwenlyn Found of cardiology for coronary artery disease status post coronary artery bypass grafting about 7 years ago. He also notes a history of PAD and procedures by Dr. dew in [place]. In spite of this the patient's wound is better today. She is nonambulatory does not complain of pain or claudication and really I don't think given everything that she requires an arteriogram or an invasive arterial evaluation. 08/01/16; in spite of her likely significant PAD the patient's wound continues to contract. As long as this is the case I am going to forego additional arterial consultation. We have been using silver alginate. She is down 0.4 cm in length today 08/08/16; patient has an appointment with Dr. Fletcher Anon today in follow-up for her PAD. Vascular studies that we did last month I think are the cause of this. She has a new wound on her lateral right fifth toe and an unruptured blister on the medial left heel. Her major wound on the right heel is at 1.5 x 1.5 x 0.1 [name] [name], [name] [name] ([id number]) 09/05/16; the  patient was last here she had vascular studies done by Dr. Fletcher Anon. This showed a ABI on the right at 0.5 to and on the left at 0.83. TBI is on the right at 0.34 on the left at 0.54. She has been subsequently seen by Dr. dew who is somewhat annoyed that we have sent her to cardiology for her studies. Nevertheless he felt that it necessary to proceed with an arteriogram for possible salvage. She has 2 remaining  wounds one on the right heel one on the medial aspect of the right fifth toe which was new the last visit. She had an area over the left lateral malleolus which is resolved. 09/19/16 on evaluation today patient appears to be doing fairly well in regard to her right lower extremity wound. She still has not had her vascular procedure for stent placement due to conflict with other appointments. For that reason her wounds do appear to be healing albeit slowly. No fevers, chills, nausea, or vomiting noted at this time. 10/04/16; I have not seen this patient in about 4 weeks. She had a wound between her right fourth and fifth toe which is healed. The other area is on the right heel laterally. Today she arrives with a new wound on the left heel medially. Also new since the last time she was here she is now in a nursing home in [place], [place] and rehabilitation. She is having arteriogram witho Stent placement by Dr. dew on September 6 let's write. Without rheumatic we go ahead with a normal anything GI 10/17/16; [facility]. She underwent revascularization of the right leg by Dr. dew on 10/12/16; this included percutaneous transluminal angioplasty of the popliteal artery as well as angioplasty of the right SFA. She also had mechanical thrombectomy to the right SFA and popliteal artery. She tolerated the procedure well. In the meantime both her heel wounds on the left medial and right lateral heel have healed. This may have more to do with her being in a skilled facility than anything else Electronic Signature(s) Signed: 10/17/2016 3:57:41 PM By: Linton Ham MD Entered By: Linton Ham on 10/17/2016 13:16:12 [name] [name], [name] [name] ([id number]) -------------------------------------------------------------------------------- Physical Exam Details [name] [name], [name] [name] Date of Service: 10/17/2016 12:30 PM [name] Patient Account Number:  [id number] Medical Record Treating RN: Ahmed Prima [id number] Number: Other Clinician: Date of Birth/Sex: 1931/09/04 (83 y.o. Female) Treating Carol Theys Primary Care Provider: LAM, LYNN Provider/Extender: G Referring Provider: LAM, LYNN Weeks in Treatment: 12 Constitutional Sitting or standing Blood Pressure is within target range for patient.. Pulse regular and within target range for patient.[name] Respirations regular, non-labored and within target range.. Temperature is normal and within the target range for the patient.[name] appears in no distress. Eyes Conjunctivae clear. No discharge. Respiratory Respiratory effort is easy and symmetric bilaterally. Rate is normal at rest and on room air.. Cardiovascular Faint dorsalis pedis pulse on the left. I cannot feel a dorsalis pedis pulse on the right. Extremities are free of varicosities, clubbing or edema. Peripheral pulses strong and equal. Capillary refill < 3 seconds.. Lymphatic None palpable in the popliteal or inguinal area on the right. Psychiatric No evidence of depression, anxiety, or agitation. Calm, cooperative, and communicative. Appropriate interactions and affect.. Notes Wound exam; the patient has no open area between the right fourth and fifth toes in the webspace. Her area on the right lateral heel is now closed. The area on the left medial heel was also closed Electronic Signature(s) Signed: 10/17/2016  3:57:41 PM By: Linton Ham MD Entered By: Linton Ham on 10/17/2016 13:17:41 [name] [name], [name] [name] ([id number]) -------------------------------------------------------------------------------- Physician Orders Details [name] [name], [name] [name] Date of Service: 10/17/2016 12:30 PM [name] Patient Account Number: [id number] Medical Record Treating RN: Ahmed Prima [id number] Number: Other Clinician: Date of Birth/Sex: Feb 14, 1931 (83 y.o. Female) Treating Lenell Lama Primary Care Provider: LAM,  LYNN Provider/Extender: G Referring Provider: LAM, LYNN Weeks in Treatment: 37 Verbal / Phone Orders: No Diagnosis Coding Discharge From [facility] o Discharge from [place] pressure off of heels when lying in bed. See MD note. Electronic Signature(s) Signed: 10/17/2016 3:57:41 PM By: Linton Ham MD Signed: 10/18/2016 4:54:17 PM By: Alric Quan Entered By: Alric Quan on 10/17/2016 13:17:25 [name] [name], [name] [name] ([id number]) -------------------------------------------------------------------------------- Problem List Details [name] [name], [name] [name] Date of Service: 10/17/2016 12:30 PM [name] Patient Account Number: [id number] Medical Record Treating RN: Ahmed Prima [id number] Number: Other Clinician: Date of Birth/Sex: 31-May-1931 (83 y.o. 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/19/2016 Yes limited to breakdown of skin E11.51 Type 2 diabetes mellitus with diabetic peripheral 07/19/2016 Yes angiopathy without gangrene E11.42 Type 2 diabetes mellitus with diabetic polyneuropathy 07/19/2016 Yes L97.421 Non-pressure chronic ulcer of left heel and midfoot limited 10/04/2016 Yes to breakdown of skin Inactive Problems Resolved Problems Electronic Signature(s) Signed: 10/17/2016 3:57:41 PM By: Linton Ham MD Entered By: Linton Ham on 10/17/2016 13:12:11 [name] [name], [name] [name] ([id number]) -------------------------------------------------------------------------------- Progress Note Details [name] [name], [name] [name] Date of Service: 10/17/2016 12:30 PM [name] Patient Account Number: [id number] Medical Record Treating RN: Ahmed Prima [id number] Number: Other Clinician: Date of Birth/Sex: [birth year] (83 y.o. Female) Treating Jari Carollo Primary Care Provider: LAM, LYNN Provider/Extender: G Referring Provider: LAM, LYNN Weeks in Treatment: 12 Subjective History of Present Illness (HPI) Y 07/19/16; this is an 83 year old woman who tells me that she has had a wound on her right heel for about a month and a half. She said she was hospitalized with postmenopausal vaginal bleeding and was hospitalized ultimately at [facility] for this. I do not have these records. Afterward she was sent to [place] for rehabilitation. She states there she developed a wound on her foot. The patient is a type II diabetic on insulin, I'm not really sure of her diabetic control. She has well care or home health we think they are putting Silvadene cream on this. ABIs in our clinic were 0.6 bilaterally. The patient and her husband it was somewhat vague history of possible stent placement in the right thigh 2-3 years ago although I don't see anything about this and [place] link. We will need to research this more before we see her again next week. She did have a normal x-ray of the heel at the end of May I believe when she was seen in the ER. There was no evidence of osteomyelitis although soft tissue edema was noted. Looking through care everywhere and [place] link I can see no reference to vascular surgery, noninvasive vascular tests. It is possible that this was non-at the local vein and vascular Center before they became part of the network. This would be Dr. Lucky Cowboy and Dr. Delana Meyer we will check with their office 07/26/16; the patient's arterial studies were really not very good she has an  ABI in the right of 0.52 and a TBI on the right of 0.34. Corresponding values on the left for better at 0.83 on the left and a TBI of 0.54. She was felt to have right mid SFA occlusion and a 50-74% long segment stenosis and diffuse 30-49%  left femoral popliteal disease with an occluded left tibial peroneal trunk. In spite of this her wound on her right heel appears to be a lot better today. Smaller with a healthy granulated surface. I have research notes in [place]. The patient is indeed followed by Dr. Gwenlyn Found of cardiology for coronary artery disease status post coronary artery bypass grafting about 7 years ago. He also notes a history of PAD and procedures by Dr. dew in [place]. In spite of this the patient's wound is better today. She is nonambulatory does not complain of pain or claudication and really I don't think given everything that she requires an arteriogram or an invasive arterial evaluation. 08/01/16; in spite of her likely significant PAD the patient's wound continues to contract. As long as this is the case I am going to forego additional arterial consultation. We have been using silver alginate. She is down 0.4 cm in length today 08/08/16; patient has an appointment with Dr. Fletcher Anon today in follow-up for her PAD. Vascular studies that we did last month I think are the cause of this. She has a new wound on her lateral right fifth toe and an unruptured blister on the medial left heel. Her major wound on the right heel is at 1.5 x 1.5 x 0.1 09/05/16; the patient was last here she had vascular studies done by Dr. Fletcher Anon. This showed a ABI on the right at 0.5 to and on the left at 0.83. TBI is on the right at 0.34 on the left at 0.54. She has been [name] [name], Terrance C. ([id number]) subsequently seen by Dr. dew who is somewhat annoyed that we have sent her to cardiology for her studies. Nevertheless he felt that it necessary to proceed with an arteriogram for possible salvage. She has 2 remaining wounds one on the right heel one on the medial aspect of the right fifth toe which was new the last visit. She had an area over the left lateral malleolus which is resolved. 09/19/16 on evaluation today patient appears to  be doing fairly well in regard to her right lower extremity wound. She still has not had her vascular procedure for stent placement due to conflict with other appointments. For that reason her wounds do appear to be healing albeit slowly. No fevers, chills, nausea, or vomiting noted at this time. 10/04/16; I have not seen this patient in about 4 weeks. She had a wound between her right fourth and fifth toe which is healed. The other area is on the right heel laterally. Today she arrives with a new wound on the left heel medially. Also new since the last time she was here she is now in a nursing home in [place], [place] and rehabilitation. She is having arteriogram witho Stent placement by Dr. dew on September 6 let's write. Without rheumatic we go ahead with a normal anything GI 10/17/16; [facility]. She underwent revascularization of the right leg by Dr. dew on 10/12/16; this included percutaneous transluminal angioplasty of the popliteal artery as well as angioplasty of the right SFA. She also had mechanical thrombectomy to the right SFA and popliteal artery. She tolerated the procedure well. In the  meantime both her heel wounds on the left medial and right lateral heel have healed. This may have more to do with her being in a skilled facility than anything else Objective Constitutional Sitting or standing Blood Pressure is within target range for patient.. Pulse regular and within target range for patient.[name] Respirations regular, non-labored and within target range.. Temperature is normal and within the target range for the patient.[name] appears in no distress. Vitals Time Taken: 12:39 PM, Height: 63 in, Weight: 203 lbs, BMI: 36, Temperature: 98.4 F, Pulse: 64 bpm, Respiratory Rate: 20 breaths/min, Blood Pressure: 135/48 mmHg. Eyes Conjunctivae clear. No discharge. Respiratory Respiratory effort is easy and symmetric bilaterally. Rate is normal at rest and  on room air.. Cardiovascular Faint dorsalis pedis pulse on the left. I cannot feel a dorsalis pedis pulse on the right. Extremities are free of varicosities, clubbing or edema. Peripheral pulses strong and equal. Capillary refill < 3 seconds.. Lymphatic [name] [name], Larken C. ([id number]) None palpable in the popliteal or inguinal area on the right. Psychiatric No evidence of depression, anxiety, or agitation. Calm, cooperative, and communicative. Appropriate interactions and affect.. General Notes: Wound exam; the patient has no open area between the right fourth and fifth toes in the webspace. Her area on the right lateral heel is now closed. The area on the left medial heel was also closed Integumentary (Hair, Skin) Wound #1 status is Open. Original cause of wound was Gradually Appeared. The wound is located on the Right Calcaneus. The wound measures 0cm length x 0cm width x 0cm depth; 0cm^2 area and 0cm^3 volume. Wound #4 status is Open. Original cause of wound was Gradually Appeared. The wound is located on the Left Calcaneus. The wound measures 0cm length x 0cm width x 0cm depth; 0cm^2 area and 0cm^3 volume. 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 See MD note. #1 I think the patient could be discharged from the wound care center [name] [name], ANALEAH BRAME ([id number]) #2 I recommended to her facility to be continually vigilant about offloading her heels #3 she has been revascularized on the right  side Electronic Signature(s) Signed: 10/17/2016 3:57:41 PM By: Linton Ham MD Entered By: Linton Ham on 10/17/2016 13:19:04 [name] [name], [name] [name] ([id number]) -------------------------------------------------------------------------------- SuperBill Details [name] [name], Liza Date of Service: 10/17/2016 [name] Patient Account Number: [id number] Medical Record Treating RN: Ahmed Prima [id number] Number: Other Clinician: Date of Birth/Sex: [birth year] (83 y.o. 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 E11.51 Type 2 diabetes mellitus with diabetic peripheral angiopathy without gangrene E11.42 Type 2 diabetes mellitus with diabetic polyneuropathy L97.421 Non-pressure chronic ulcer of left heel and midfoot limited to breakdown of skin Facility Procedures CPT4 Code: [id number] Description: 80998 - WOUND CARE VISIT-LEV 2 EST PT Modifier: Quantity: 1 Physician Procedures CPT4: Description Modifier Quantity Code [id number] - WC PHYS LEVEL 3 -  EST PT 1 ICD-10 Description Diagnosis 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

## 2016-10-23 ENCOUNTER — Other Ambulatory Visit: Payer: Self-pay | Admitting: *Deleted

## 2016-10-23 NOTE — Patient Outreach (Signed)
[facility]) Care Management  10/23/2016  [name] [name] 1931-09-24 [id number]   CSW made an initial attempt to try and contact patient today at [facility] [place] where patient currently resides to receive short-term rehabilitative services; however, patient was unavailable at the time of CSW's call.  A HIPAA compliant message was left for patient with patient's attending nurse at the skilled nursing facility.  CSW is currently awaiting a return call.  CSW will continue to follow patient while at the skilled nursing facility to assess and assist with discharge planning needs and services, then report findings to CSW colleague, [name], for whom CSW is currently covering.  CSW will make a second outreach attempt within the next week, if CSW does not receive a return call from patient in the meantime. [name], BSW, MSW, LCSW  Licensed Education officer, environmental Health System  Mailing [place] [address] [place], [place] [address] Physical Address-[address], [place], [place] [address] Toll Free Main # [phone] Fax # [phone] Cell # [phone]  Office # [phone] [email or web address]

## 2016-10-24 ENCOUNTER — Other Ambulatory Visit: Payer: Self-pay | Admitting: *Deleted

## 2016-10-24 DIAGNOSIS — I129 Hypertensive chronic kidney disease with stage 1 through stage 4 chronic kidney disease, or unspecified chronic kidney disease: Secondary | ICD-10-CM | POA: Diagnosis not present

## 2016-10-24 DIAGNOSIS — G20 Parkinson's disease: Secondary | ICD-10-CM | POA: Diagnosis not present

## 2016-10-24 DIAGNOSIS — L97509 Non-pressure chronic ulcer of other part of unspecified foot with unspecified severity: Secondary | ICD-10-CM | POA: Diagnosis not present

## 2016-10-24 DIAGNOSIS — D62 Acute posthemorrhagic anemia: Secondary | ICD-10-CM | POA: Diagnosis not present

## 2016-10-24 DIAGNOSIS — E1122 Type 2 diabetes mellitus with diabetic chronic kidney disease: Secondary | ICD-10-CM | POA: Diagnosis not present

## 2016-10-24 DIAGNOSIS — R410 Disorientation, unspecified: Secondary | ICD-10-CM | POA: Diagnosis not present

## 2016-10-24 DIAGNOSIS — I130 Hypertensive heart and chronic kidney disease with heart failure and stage 1 through stage 4 chronic kidney disease, or unspecified chronic kidney disease: Secondary | ICD-10-CM | POA: Diagnosis not present

## 2016-10-24 DIAGNOSIS — N179 Acute kidney failure, unspecified: Secondary | ICD-10-CM | POA: Diagnosis not present

## 2016-10-24 DIAGNOSIS — D72829 Elevated white blood cell count, unspecified: Secondary | ICD-10-CM | POA: Diagnosis not present

## 2016-10-24 DIAGNOSIS — I5032 Chronic diastolic (congestive) heart failure: Secondary | ICD-10-CM | POA: Diagnosis not present

## 2016-10-24 DIAGNOSIS — C541 Malignant neoplasm of endometrium: Secondary | ICD-10-CM | POA: Diagnosis not present

## 2016-10-24 DIAGNOSIS — N189 Chronic kidney disease, unspecified: Secondary | ICD-10-CM | POA: Diagnosis not present

## 2016-10-24 DIAGNOSIS — M6281 Muscle weakness (generalized): Secondary | ICD-10-CM | POA: Diagnosis not present

## 2016-10-24 DIAGNOSIS — E11319 Type 2 diabetes mellitus with unspecified diabetic retinopathy without macular edema: Secondary | ICD-10-CM | POA: Diagnosis not present

## 2016-10-24 DIAGNOSIS — C55 Malignant neoplasm of uterus, part unspecified: Secondary | ICD-10-CM | POA: Diagnosis not present

## 2016-10-24 DIAGNOSIS — R479 Unspecified speech disturbances: Secondary | ICD-10-CM | POA: Diagnosis not present

## 2016-10-24 DIAGNOSIS — E1165 Type 2 diabetes mellitus with hyperglycemia: Secondary | ICD-10-CM | POA: Diagnosis not present

## 2016-10-24 DIAGNOSIS — E1151 Type 2 diabetes mellitus with diabetic peripheral angiopathy without gangrene: Secondary | ICD-10-CM | POA: Diagnosis not present

## 2016-10-24 DIAGNOSIS — D649 Anemia, unspecified: Secondary | ICD-10-CM | POA: Diagnosis not present

## 2016-10-24 DIAGNOSIS — I2510 Atherosclerotic heart disease of native coronary artery without angina pectoris: Secondary | ICD-10-CM | POA: Diagnosis not present

## 2016-10-24 DIAGNOSIS — N390 Urinary tract infection, site not specified: Secondary | ICD-10-CM | POA: Diagnosis not present

## 2016-10-24 DIAGNOSIS — E11621 Type 2 diabetes mellitus with foot ulcer: Secondary | ICD-10-CM | POA: Diagnosis not present

## 2016-10-24 DIAGNOSIS — R739 Hyperglycemia, unspecified: Secondary | ICD-10-CM | POA: Diagnosis not present

## 2016-10-24 DIAGNOSIS — I10 Essential (primary) hypertension: Secondary | ICD-10-CM | POA: Diagnosis not present

## 2016-10-24 DIAGNOSIS — R531 Weakness: Secondary | ICD-10-CM | POA: Diagnosis not present

## 2016-10-24 NOTE — Patient Outreach (Addendum)
[place] [facility]) Care Management  10/24/2016  [name] [name] 06-14-1931 [id number]   CSW made a second attempt to try and contact patient today at [facility] [place] where patient currently resides to receive short-term rehabilitative services; however, patient was unavailable at the time of CSW's call.  A HIPAA compliant message was left for patient with patient's attending nurse at the skilled nursing facility.  CSW is currently awaiting a return call. CSW will continue to follow patient while at the skilled nursing facility to assess and assist with discharge planning needs and services, then report findings to CSW colleague, [name], for whom CSW is currently covering.  CSW will make a third outreach attempt within the next week, if CSW does not receive a return call from patient in the meantime. [name], BSW, MSW, LCSW  Licensed Education officer, environmental Health System  Mailing [place] [address], [place], [place] [address] Physical Address-[address], [place], [place] [address] Toll Free Main # [phone] Fax # [phone] Cell # [phone]  Office # [phone] [email or web address]    * Addendum:  Return call received from patient's daughter, [name] in response to the HIPAA compliant message left for patient on voicemail by CSW, earlier in the day.  [name] [name] reported that she currently has patient's cell phone in her possession, as patient was being transported back to the hospital while CSW was conversing with [name] [name].  [name] [name] went on to say that patient was discharged from [facility], [place] where patient was residing to receive short-term rehabilitative services, on yesterday, and was only home for one evening before having to return to the hospital.  [name] [name] indicated that patient's prescription medications  "got all switched around while she was at [facility]", so patient's blood sugar has been running high.  [name] [name] reported, "It's probably time for momma to go to a rest home or something", then the call was terminated.  [name] [name] called right back to say that she was riding the city bus and that she would have to return CSW's call at a later time.  CSW will await a return call from [name] [name].

## 2016-10-24 NOTE — Patient Outreach (Signed)
[facility]) Care Management  10/24/2016  [name] [name] [birth year] [id number]  Incoming call from patient, she reports she was discharged from [facility] on yesterday, 9/18  Reports she got home about 11am.  Patient states I am not sure what to do with her medications, states she has not taken anything since being home. Patient requested that I call her sister in law [name] [name] to come and visit her today, explained that I do not have her number and I will call her daughter [name] [name] she is agreeable .  Patient asking that I visit her today to help with her medications.   Placed call to [name] [name], to discuss patient discharge and her request regarding calling her sister in law.  [name] [name] discussed concerns regarding patient being at home and unsure if husband will be able to help her.  Discussed plan to follow up with patient this afternoon.  1200  Received call from patient home, [place] home health nurse on phone verifies she is visiting patient in home at this time, she wanted to verify that [facility] was not a home health service. She reported she was there to evaluate patient and review her medication list.   1 PM Called to patient home agreeable to home visit this afternoon.  Shortly afterwards received incoming call from patient daughter [name] [name], reporting not to plan to visit patient on today, she had received phone call from [place] visiting stating patient blood sugar is 557, and blood pressure over 200/ and MD office had been called and recommended for patient to go to emergency room.   1500 Incoming call from [name], Triage nurse from [facility] to discuss recent phone call from [place] regarding patient elevated blood sugar, and recommendation to go to ED. She discussed patient has scheduled PCP visit on 8/20, she discussed report to patient having insulin vials and syringes at home and different doses than prior to  admission , discussed that at previous home visits prior to rehab stay patient taking insulin by pens.   Plan Will place call to [place] to discuss today's events.  Will plan follow with patient in the next day regarding home visit this week .    [name], RN, [place] Management Coordinator  [phone]- Mobile [phone]- Toll Free Main Office

## 2016-10-25 DIAGNOSIS — R479 Unspecified speech disturbances: Secondary | ICD-10-CM | POA: Diagnosis not present

## 2016-10-26 ENCOUNTER — Other Ambulatory Visit: Payer: Self-pay | Admitting: *Deleted

## 2016-10-26 DIAGNOSIS — D72829 Elevated white blood cell count, unspecified: Secondary | ICD-10-CM | POA: Diagnosis not present

## 2016-10-26 NOTE — Patient Outreach (Signed)
[place] [facility]) Care Management  10/26/2016  [name] 10-04-1931 [id number]   Follow up telephone call   Placed call to patient contact, daughter [name] [name], reports patient went to [facility] ED on 9/18 as recommended, she was admitted to a room on 9/19, she reports they have been able to get her blood sugar and blood pressure stabilized, states there was a question of stroke symptoms on yesterday and a CT scan was done unsure of results.  Daughter reports she has spoken with social worker at hospital on yesterday  Regarding care concerns when patient returns home , states she provided the social worker contact number for [employer] [facility] with [name] [name] that saw patient on 9/18 for further explanation of concerns.  Daughter reports that patient fell out of bed on her first night home and called for emergency assistance to hlep get patient off the  floor reports she was not hurt.    Care Coordination  Placed call to [place], [facility] agency, spoke with Musician , she discussed  safety concerns regarding patient returning home, [name] [name] will no longer be able to follow her at  discharge, due to safety concerns at home .[name] reports she has spoken with [name], Education officer, museum at [facility] as requested by patient daughter.  Placed call to [facility] care management department, able to leave a message requesting return call on confidential voice message.  Received return call from [facility], care manager at [facility], she discussed that physical therapy has evaluated patient  and recommends SNF at discharge not strong enough to return home at this time,  they have forwarded information to [facility] on yesterday and will send additional notes on today,the hospital medicaid specialist is looking into patient  medicaid application status.  Patient is not medically ready for discharge yet.   Will update [name], [facility] LCSW,  Placed follow up call to  [name] , Triage nurse at [facility] to update.   [name], RN, [place] Management Coordinator  [phone]- Mobile [phone]- Toll Free Main Office

## 2016-10-31 ENCOUNTER — Other Ambulatory Visit: Payer: Self-pay | Admitting: *Deleted

## 2016-10-31 NOTE — Patient Outreach (Signed)
[facility]) Care Management  10/31/2016  [name] [name] 09/10/1931 [id number]   CSW has had phone conversations with Jason Fila (patient's spouse), patient's son, Sheliah Mends and with Hospital CM currently working with patient in the hospital. CSW has discussed conerns related to patient continuing to bounce in and out of hospital and the concern for inadequate care and support at home.  CSW hs had discussions with patient, spouse, Sheliah Mends and other family members in recent past, about the probable need/benefit of her staying in a long term facility setting (SNF or ALF). It continues to appear that the main barrier to her being able to get adequate care/support whether at home or in a SNF/ALF, is the financial burden and what has been shared to be a property transfer made that prevents and penalizes her from being eligible for Medicaid for about one more year.   CSW continues to investigate and work on communicating and determining options.   Patient remains hospitalized in [facility] and they are awaiting a "peer to peer" for SNF auth determination after being declined on 10/27/16.     THN CM Care Plan Problem One     Most Recent Value  Care Plan Problem One  Patient hospitalized and in need of more support than home can provide.  Role Documenting the Problem One  Clinical Social Worker  Care Plan for Problem One  Active  [facility] Long Term Goal   Patient and family will arrange for adequate support/care in the home -vs_ LTC setting in the next 60 days.   THN Long Term Goal Start Date  10/31/16  Interventions for Problem One Long Term Goal  CSW assisting with discussions related to Medicaid barriers and inadequate care/support at home.   THN CM Short Term Goal #1    Patient's family will provide and assist with Medicaid application in the next 30 days.   THN CM Short Term Goal #1 Start Date  10/31/16  Interventions for Short Term Goal #1  CSW discussing needs, concerns and stressing  for family to cooperate with info needed and to assist with Medicaid application.   THN CM Short Term Goal #2   Patient will have adequate in home care or be placed in LTC setting in the next 90 days.   THN CM Short Term Goal #2 Start Date  10/31/16  Interventions for Short Term Goal #2  CSW discussing needs, concerns and stressing for family to cooperate with info needed and to assist with Medicaid application.   THN CM Short Term Goal #3     Interventions for Short Tern Goal #3     THN CM Short Term Goal #4     Interventions for Short Term Goal #4     THN CM Short Term Goal #5      Interventions for Short Term Goal #5       [facility] CM Care Plan Problem Two     Most Recent Value  Care Plan Problem Two     Role Documenting the Problem Two  Clinical Social Worker  [facility] CM Short Term Goal #1   Patient will not experience a fall in the next 30 days   THN CM Short Term Goal #1 Met Date   01/07/16  Interventions for Short Term Goal #2   Reinforced fall prevention strategies   THN CM Short Term Goal #2   Patient will have medical alert system in place in the next 30 days  Interventions for Short Term  Goal #2  Assisted patient with making call to [facility] volunteer services for alert system   THN CM Short Term Goal #3   Patient will report doing walking in place exercises at least 5 minutes , 5 times a week in the next 30 days   Interventions for Short Term Goal #3  Discussed use of her foot pedals exercise, reinforced  to continue leg lift exercises while in bed             [name], MSW, [place] Worker  [place] [phone]

## 2016-11-02 ENCOUNTER — Other Ambulatory Visit: Payer: Self-pay | Admitting: *Deleted

## 2016-11-02 NOTE — Patient Outreach (Signed)
[facility]) Care Management  11/02/2016  [name] [name] January 28, 1932 [id number]   CSW spoke with [name] [name] at [name] where patient is awaiting insurance peer to peer decision. [name] [name] reports they peer to peer took place on 9/25 and they requested additional info be faxed. They are hoping to hear something today. [name] [name] also reports they are planning to call APS if patient goes home; primarily for the concern/issue of family not being cooperative with financial info being shared for a Medicaid application to be completed.  CSW advised [place] of plans to try again to reach patient's husband's son to discuss the needs again.  CSW also asked hospital to assess for the appropriateness of a palliative or hospice consult. [name] [name] will discuss with team and states if appropriate they can arrange for [facility]  as they also have a hospice component.   Next, CSW contacted [name] [name], the son and reported HCPOA and Durable POA of patient's husband, [name] [name]. CSW was able to reach him and introduced self, role and reason for call. "She is not my stepm mother, she is my father's wife". CSW attempted to educate and explain to him why it was important and necessary for [name] [name] info to be provided for the Medicaid application.  [name] [name] declines, refuses and is adamant that the children of [name] [name] need to come forward and support their mother's financial needs "because they took stuff out of her name....". CSW  Attempted to redirect [name] [name] to explain the Medicaid application and determination process when it involves a married couple where one is remaining in the home and the other needing long term care.  [name] [name] remained adamant that he was not willing to disclose any of his father's finances; further accusing "her family cashed in a life insurance policy of my dads".  CSW also shared with [name] [name] the potential for APS to become involved due to feelings (per [facility] staff) that  family was "neglectful to her needs" by not cooperating and providing financial info/status of patient's husband.  [name] [name] retaliated by stating he has spoken with an Attorney who told him he can "file a restraining order on [name] [name] and all her family" so that she is not released back to the home. CSW attempted to share with [name] [name] our genuine overall concerns for both [name] [name] and [name] [name] well-being and safety; and that her returning home without adequate home support ( caregiver other than [name] [name]) was not ideal or safe for either one of the elderly spouses.   CSW will seek additional support from Supervisors and await word from [facility] about dc plans and update.     THN CM Care Plan Problem One     Most Recent Value  Care Plan Problem One  Patient hospitalized and in need of more support than home can provide.  Role Documenting the Problem One  Clinical Social Worker  Care Plan for Problem One  Active  [name] Long Term Goal   Patient and family will arrange for adequate support/care in the home -vs_ LTC setting in the next 60 days.   THN Long Term Goal Start Date  10/31/16  Interventions for Problem One Long Term Goal  CSW assisting with discussions related to Medicaid barriers and inadequate care/support at home.   THN CM Short Term Goal #1    Patient's family will provide and assist with Medicaid application in the next 30 days.   THN CM Short Term Goal #1 Start Date  10/31/16  Interventions for Short  Term Goal #1  CSW discussing needs, concerns and stressing for family to cooperate with info needed and to assist with Medicaid application.   THN CM Short Term Goal #2   Patient will have adequate in home care or be placed in LTC setting in the next 90 days.   THN CM Short Term Goal #2 Start Date  10/31/16  Interventions for Short Term Goal #2  CSW discussing needs, concerns and stressing for family to cooperate with info needed and to assist with Medicaid application.   THN CM Short Term Goal  #3     Interventions for Short Tern Goal #3     THN CM Short Term Goal #4     Interventions for Short Term Goal #4     THN CM Short Term Goal #5      Interventions for Short Term Goal #5       [facility] CM Care Plan Problem Two     Most Recent Value  Care Plan Problem Two     Role Documenting the Problem Two  Clinical Social Worker  [facility] CM Short Term Goal #1   Patient will not experience a fall in the next 30 days   THN CM Short Term Goal #1 Met Date   01/07/16  Interventions for Short Term Goal #2   Reinforced fall prevention strategies   THN CM Short Term Goal #2   Patient will have medical alert system in place in the next 30 days  Interventions for Short Term Goal #2  Assisted patient with making call to [facility] volunteer services for alert system   THN CM Short Term Goal #3   Patient will report doing walking in place exercises at least 5 minutes , 5 times a week in the next 30 days   Interventions for Short Term Goal #3  Discussed use of her foot pedals exercise, reinforced  to continue leg lift exercises while in bed       [name], MSW, [place] Worker  [place] [phone]

## 2016-11-03 ENCOUNTER — Ambulatory Visit: Payer: Self-pay | Admitting: *Deleted

## 2016-11-06 ENCOUNTER — Other Ambulatory Visit: Payer: Self-pay | Admitting: *Deleted

## 2016-11-06 NOTE — Patient Outreach (Addendum)
[facility]) Care Management  11/06/2016  TEANA LINDAHL 24-Jun-1931 [id number]   CSW spoke to Hospital rep who  Indicates they are anticipating a dc this afternoon either to home of to SNF- family considering private pay per [facility] staff.  CSW also spoke with daughter, [name] is inquiring with hospital about the pending appeal and is uncertain of dc plans.  CSW asked Hospital staff to call with any d/c updates as soon as determined.   [name], MSW, [place] Worker  [place] [phone]

## 2016-11-07 ENCOUNTER — Other Ambulatory Visit (INDEPENDENT_AMBULATORY_CARE_PROVIDER_SITE_OTHER): Payer: Self-pay | Admitting: Vascular Surgery

## 2016-11-07 ENCOUNTER — Other Ambulatory Visit: Payer: Self-pay | Admitting: *Deleted

## 2016-11-07 DIAGNOSIS — I739 Peripheral vascular disease, unspecified: Secondary | ICD-10-CM

## 2016-11-08 ENCOUNTER — Other Ambulatory Visit: Payer: Self-pay | Admitting: *Deleted

## 2016-11-08 ENCOUNTER — Encounter (INDEPENDENT_AMBULATORY_CARE_PROVIDER_SITE_OTHER): Payer: Medicare Other | Admitting: Vascular Surgery

## 2016-11-08 ENCOUNTER — Encounter (INDEPENDENT_AMBULATORY_CARE_PROVIDER_SITE_OTHER): Payer: Medicare Other

## 2016-11-08 NOTE — Patient Outreach (Signed)
[facility]) Care Management  11/07/2016  TERRILL WAUTERS [birth year] [id number]   CSW spoke with [facility] CM who reports the family has asked for a second appeal for SNF authorization. Per hospital staff, patient remains hospitalized and they anticipate a decision in 11/08/16.  CSW will inquire with family and hospital again tomorrow.   [name], MSW, [place] Worker  [place] [phone]

## 2016-11-08 NOTE — Patient Outreach (Signed)
[place] [facility]) Care Management  11/08/2016  AOIFE BOLD 15-Dec-1931 [id number]     CSW spoke with [place] again today who indicates they are still awaiting word from family appeal. CSW has asked for update as dc is planned as well as DC Summary to [facility].   Will update THN RNCM as well.   [name], MSW, [place] Worker  [place] [phone]

## 2016-11-09 ENCOUNTER — Other Ambulatory Visit: Payer: Self-pay | Admitting: *Deleted

## 2016-11-09 NOTE — Patient Outreach (Signed)
[place] [facility]) Care Management  11/09/2016  [name] [name] April 03, 1931 [id number]    CSW contacted Hospital CM and was informed  that patient would be released to home later today. The hospital was able to get [facility] PT, OT, RN,SW and a bath aide arranged with [facility].  CSW has advised [name], RN, [facility] RNCM of above plans and she plans to contact patient at home. CSW also spoke with daughter, [name] [name], by phone today who also indicated plans for dc to home today with patient's husband and her son, [name] [name], assisting with transport home.   CSW has requested a copy of dc instructions and will plan f/u call tomorrow as well.   [name], MSW, [place] Worker  [place] [phone]

## 2016-11-10 ENCOUNTER — Other Ambulatory Visit: Payer: Self-pay | Admitting: *Deleted

## 2016-11-10 ENCOUNTER — Other Ambulatory Visit: Payer: Self-pay | Admitting: Pharmacist

## 2016-11-10 ENCOUNTER — Encounter: Payer: Self-pay | Admitting: *Deleted

## 2016-11-10 NOTE — Patient Outreach (Addendum)
[facility]) Care Management   11/10/2016  [name] [name] December 08, 1931 [id number]  [name] [name] is an 83 y.o. female   Patient discharged from [facility] on 10/4 Dx.Hyperglycemia.   Subjective:  Patient reports she  glad to be at home , but she needs to get medication and some other things straightened out . Patient discussed family is trying to work and some other help in the help her.   Patient discussed she slid to floor while trying to get into bed on last evening, but she didn't get hurt.     Objective:  BP (!) 148/70 (BP Location: Left Arm, Patient Position: Sitting, Cuff Size: Large)   Pulse 77   SpO2 98%   Observed patient ambulation to bathroom requires assistance out of wheelchair, and ambulating to bathroom using rolling walker, once she gets to doorway of bathroom due to wheelchair does not fit through bathroom door.  Review of Systems  Constitutional: Negative.   HENT: Negative.   Eyes: Negative.   Respiratory: Negative.   Cardiovascular: Positive for leg swelling.  Gastrointestinal: Negative.   Genitourinary: Negative.   Musculoskeletal: Negative.   Skin: Negative.   Neurological: Negative.   Endo/Heme/Allergies: Negative.   Psychiatric/Behavioral:       Forgetfullness     Physical Exam  Constitutional: She is oriented to person, place, and time. She appears well-developed and well-nourished.  Cardiovascular: Normal rate and normal heart sounds.   Respiratory: Effort normal.  GI: Soft.  Neurological: She is alert and oriented to person, place, and time.  Skin: Skin is warm and dry.  Psychiatric: She has a normal mood and affect. Her behavior is normal. Judgment and thought content normal.    Encounter Medications:   Outpatient Encounter Prescriptions as of 11/10/2016  Medication Sig Note  . ACCU-CHEK SOFTCLIX LANCETS lancets CHECK BLOOD SUGARS TWICE DAILY   . acetaminophen (TYLENOL) 325 MG tablet Take 650 mg by  mouth every 6 (six) hours as needed for mild pain.   [name] aspirin 81 MG tablet Take 81 mg by mouth daily.   . carvedilol (COREG) 6.25 MG tablet Take 6.25 mg by mouth 2 (two) times daily with a meal.   . furosemide (LASIX) 40 MG tablet Take 80 mg by mouth daily. Taking 80 mg daily   . Insulin NPH Isophane & Regular (NOVOLIN 70/30 [place]) Inject 52 Units into the skin 2 (two) times daily with a meal. 52 units every morning  52 units every evening   . Insulin Pen Needle (RELION SHORT PEN NEEDLES) 31G X 8 MM MISC 1 each by Does not apply route 2 (two) times daily.   . polyethylene glycol (MIRALAX / GLYCOLAX) packet USE 1-2 PACKETS DAILY 11/10/2016: Has on hand if needed  . pravastatin (PRAVACHOL) 40 MG tablet TAKE 1 TABLET BY MOUTH AT BEDTIME   . ranitidine (ZANTAC) 150 MG tablet Take 150 mg by mouth at bedtime.    . bisacodyl (DULCOLAX) 5 MG EC tablet Take 5 mg by mouth daily as needed for moderate constipation.   . Cholecalciferol 50000 units TABS Take 1 tablet by mouth once.   . clopidogrel (PLAVIX) 75 MG tablet Take 1 tablet (75 mg total) by mouth daily. (Patient not taking: Reported on 11/10/2016)   . hydroxypropyl methylcellulose / hypromellose (ISOPTO TEARS / GONIOVISC) 2.5 % ophthalmic solution Place 1 drop into both eyes as needed for dry eyes.   . insulin lispro (HUMALOG) 100 UNIT/ML injection Inject 3-10 Units into the skin  4 (four) times daily -  with meals and at bedtime.   . megestrol (MEGACE) 400 MG/10ML suspension Take 400 mg by mouth 2 (two) times daily.   . Melatonin 5 MG TABS Take 1 tablet by mouth at bedtime.   . senna (SENOKOT) 8.6 MG TABS tablet Take 2 tablets by mouth at bedtime.    No facility-administered encounter medications on file as of 11/10/2016.     Functional Status:   In your present state of health, do you have any difficulty performing the following activities: 11/10/2016 10/10/2016  Hearing? N N  Vision? Y N  Difficulty concentrating or making decisions? Y Y  Comment -  -  Walking or climbing stairs? Y Y  Dressing or bathing? [name] [name]  Comment has family , regional consolidate services  -  Doing errands, shopping? Y -  Comment family assistance  -  Conservation officer, nature and eating ? Y -  Comment family assist -  Using the Toilet? Y -  In the past six months, have you accidently leaked urine? Y -  Comment - -  Do you have problems with loss of bowel control? N -  Managing your Medications? Y -  Managing your Finances? Y -  Comment family helps  -  Housekeeping or managing your Housekeeping? Y -  Comment community agency assist with family  -  Some recent data might be hidden    Fall/Depression Screening:    Fall Risk  11/10/2016 09/07/2016 06/21/2016  Falls in the past year? Yes Yes Yes  Number falls in past yr: 2 or more 1 1  Injury with Fall? No No No  Risk Factor Category  High Fall Risk - -  Risk for fall due to : History of fall(s);Impaired balance/gait;Impaired mobility History of fall(s) Impaired balance/gait;Impaired mobility  Follow up Falls evaluation completed;Falls prevention discussed Falls evaluation completed;Falls prevention discussed Falls evaluation completed;Falls prevention discussed;Education provided   [facility] 2/9 Scores 11/10/2016 09/07/2016 06/21/2016 05/26/2016 04/05/2016 12/28/2015 11/22/2015  PHQ - 2 Score 1 1 1 1 1  0 1    Assessment:  Patient son [name] [name] present during visit   Diabetes- blood sugar this am 390, patient needs assistance with checking blood sugar as well as insulin administration. Family arranging for assistance in morning and evening for blood sugar monitoring and medication administration .Family member was able to administer insulin 70/30, 52 units per discharge instructions this morning .   Medications  Discharge medication list reviewed with son, [name] [name] listed on AVS patient does not have currently at home, [facility] [place] [facility] [place] they do not have current prescription. Placed call to PCP  office to notify. Patient has blister pack medication from her recent stay a [facility] prior to readmission to hospital, family is currently using these medications, updated dose instructions on packages to match discharge instructions. Patient only had 70/30 insulin in vial at home, family member was able to draw up and administer this morning prior to my arrival  Patient had new prescription for Insulin 70/30 pen,  Lisinopril , patient also needs 81 mg  coated Aspirin that son will pick up today.  Patient will need daily assistance with taking medications.Patient will benefit from, [place] consult for medication adherence concerns and review,  PCP visit for Medication reconciliation.  EPIC medication list including Plavix and megace not currently on discharge list. Patient was  getting pill packaging from [facility] family pharmacy, prior to admission to SNF.  Hypertension- blood pressure stable at  Visit, MD to address medication , patient  needs reinforcement on limiting salt in diet.   Fall Risk - high fall risk slid from bed last evening no injury per reports, family interested in getting a hospital bed. Patient using rolling walker and has medic alert button .   Endometrial cancer - no vaginal bleeding per support.  has follow up appointment with GYN .   Right heel wound - site dry, intact.  Social - family arranging additional support in home for morning and evening to help with medications, blood sugar checks assistance to bathroom, meal prep( she gets meals on wheel and eats that for her evening meal). Patient has regional consolidated services for bath aide once a week and housekeeping services. May benefit from Care connections program.     Plan 1.Placed call to PCP office regarding elevated blood sugar, and to notify of [name] [name] on discharge instructions and patient does not have as prescription, also patient was on novolog sliding scale prior,to admissions, not currently on  discharge list, spoke with Nonie Hoyer nurse  she will follow up with Charlott Holler, NP, and notify of any changes.Patient will attend PCP visit as scheduled on 10/8 at 2 pm. Reviewed low blood sugar symptoms with patient and action.  3 Will consult [place] for medication review and adherence concerns, spoke with Lenis [place], Pharmacist following patient previously for patient concern related to cost of insulin.  4. Reinforced to keep, medic alert button on, do not attempt to get up without assistance , will discuss hospital bed with PCP office family is requesting.  5. Patient will follow up with GYN in next month,  6. Reinforced  to elevate legs throughout the day  as able.  7. Continue to provide support to patient and family, place call to Care connections for additional support. Placed call to [facility] home health they report they are processing patient orders and anticipate visit over the weekend  Reviewed with patient/family to notify MD of new or worsening of symptoms.   [place] call from Nonie Hoyer, nurse with PCP, stating PCP has called in prescription for [name] [name] and Novolog  sliding scale insulin if needed for elevated blood sugar.  placed call to patient son [name] [name] to inform. [name] has already picked up Lisinopril and 70/30 insulin   Will send PCP visit note. Patient will remain active with transition of care program, and receive weekly outreach, call in next week.   THN CM Care Plan Problem One     Most Recent Value  Care Plan Problem One  Recent hospitalization related to hyperglycemia   Role Documenting the Problem One  Care Management Felt for Problem One  Active  THN Long Term Goal   Patient will not experience a hospital admission in the next 31 days   THN Long Term Goal Start Date  11/10/16  Interventions for Problem One Long Term Goal  Reviewed discharge instructions, reinforced taking medications as prescribed, notifying MD of new concerns    THN CM Short Term Goal #1   Patient will report checking blood sugar at least twice daily in the next 31 days   THN CM Short Term Goal #1 Start Date  11/10/16  Interventions for Short Term Goal #1  Assisted with demonstration of how to use CBG meter, patient will need reinforcement and assistance   THN CM Short Term Goal #2   Patient will report no falls in the  next 30 days   THN CM Short Term Goal #2 Start Date  11/10/16  Interventions for Short Term Goal #2  Advised regarding fall precautions, do not get out of chair or bed without assistance, keep medical alert necklace on ,       [name], RN, [place] Management Coordinator  [phone]- Mobile [phone]- [place]

## 2016-11-10 NOTE — Patient Outreach (Signed)
[facility]) Care Management  [facility] Social Work  11/10/2016  MINERVIA OSSO [birth year] [id number]  Subjective:  "she's home and we are managing"  Objective: CSW to assist patient with commuity based resources to aide in her well-being, quality of life and overall safety/needs.    Current Medications:  Current Outpatient Prescriptions  Medication Sig Dispense Refill  . ACCU-CHEK SOFTCLIX LANCETS lancets CHECK BLOOD SUGARS TWICE DAILY 200 each 12  . acetaminophen (TYLENOL) 325 MG tablet Take 650 mg by mouth every 6 (six) hours as needed for mild pain.    [name] aspirin 81 MG tablet Take 81 mg by mouth daily.    . bisacodyl (DULCOLAX) 5 MG EC tablet Take 5 mg by mouth daily as needed for moderate constipation.    . carvedilol (COREG) 6.25 MG tablet Take 6.25 mg by mouth 2 (two) times daily with a meal.    . Cholecalciferol 50000 units TABS Take 1 tablet by mouth once.    . clopidogrel (PLAVIX) 75 MG tablet Take 1 tablet (75 mg total) by mouth daily. 30 tablet 11  . furosemide (LASIX) 40 MG tablet Take 80 mg by mouth 2 (two) times daily.    . hydroxypropyl methylcellulose / hypromellose (ISOPTO TEARS / GONIOVISC) 2.5 % ophthalmic solution Place 1 drop into both eyes as needed for dry eyes.    . insulin lispro (HUMALOG) 100 UNIT/ML injection Inject 3-10 Units into the skin 4 (four) times daily -  with meals and at bedtime.    . Insulin NPH Isophane & Regular (NOVOLIN 70/30 [place]) Inject 60 Units into the skin 3 (three) times daily with meals. 50 units every morning  50 units every evening    . Insulin Pen Needle (RELION SHORT PEN NEEDLES) 31G X 8 MM MISC 1 each by Does not apply route 2 (two) times daily. 50 each 11  . megestrol (MEGACE) 400 MG/10ML suspension Take 400 mg by mouth 2 (two) times daily.    . Melatonin 5 MG TABS Take 1 tablet by mouth at bedtime.    . polyethylene glycol (MIRALAX / GLYCOLAX) packet USE 1-2 PACKETS DAILY 60 packet 0  . pravastatin (PRAVACHOL)  40 MG tablet TAKE 1 TABLET BY MOUTH AT BEDTIME 30 tablet 3  . ranitidine (ZANTAC) 150 MG tablet Take 150 mg by mouth 2 (two) times daily.    [name] senna (SENOKOT) 8.6 MG TABS tablet Take 2 tablets by mouth at bedtime.     No current facility-administered medications for this visit.     Functional Status:  In your present state of health, do you have any difficulty performing the following activities: 10/10/2016 09/07/2016  Hearing? N N  Vision? N -  Difficulty concentrating or making decisions? Y Y  Comment - family assist   Walking or climbing stairs? Y Y  Dressing or bathing? Y Y  Comment - has personal care providers  Doing errands, shopping? - Y  Preparing Food and eating ? - Y  Using the Toilet? - Y  In the past six months, have you accidently leaked urine? - Y  Comment - wears depends  Do you have problems with loss of bowel control? - N  Managing your Medications? - Y  Managing your Finances? - Y  Comment - family helps  Housekeeping or managing your Housekeeping? - Y  Comment - has personal care help  Some recent data might be hidden    Fall/Depression Screening:  Fall Risk  09/07/2016 06/21/2016 05/26/2016  Falls  in the past year? Yes Yes No  Number falls in past yr: 1 1 -  Injury with Fall? No No -  Risk for fall due to : History of fall(s) Impaired balance/gait;Impaired mobility Impaired vision;Impaired mobility;Impaired balance/gait  Follow up Falls evaluation completed;Falls prevention discussed Falls evaluation completed;Falls prevention discussed;Education provided -   [facility] 2/9 Scores 09/07/2016 06/21/2016 05/26/2016 04/05/2016 12/28/2015 11/22/2015 11/18/2015  PHQ - 2 Score 1 1 1 1  0 1 1    Assessment:  CSW made contact with patient's spouse at home who reports she was released from hospital to home on yesterday. He reports a friend is there visiting now. CSW has requested hospital send dc instructions/summary to [place] for in home collaboration with [place].    Plan:    [facility] CM Care Plan Problem One     Most Recent Value  Care Plan Problem One  Patient hospitalized and in need of more support than home can provide.  Role Documenting the Problem One  Clinical Social Worker  Care Plan for Problem One  Active  [facility] Long Term Goal   Patient and family will arrange for adequate support/care in the home -vs_ LTC setting in the next 60 days.   THN Long Term Goal Start Date  11/10/16  Interventions for Problem One Long Term Goal  CSW aseessing and assisting with discussions related to Medicaid barriers and adequate care/support at home.   THN CM Short Term Goal #1    Patient's family will provide and assist with Medicaid application in the next 30 days.   THN CM Short Term Goal #1 Start Date  11/10/16  Interventions for Short Term Goal #1  CSW discussing needs, concerns and stressing for family to cooperate with info needed and to assist with Medicaid application.   THN CM Short Term Goal #2   Patient will have adequate in home care or be placed in LTC setting in the next 90 days.   THN CM Short Term Goal #2 Start Date  11/10/16  Interventions for Short Term Goal #2  CSW discussing needs, concerns and stressing for family to cooperate with info needed and to assist with Medicaid application.   THN CM Short Term Goal #3     Interventions for Short Tern Goal #3     THN CM Short Term Goal #4     Interventions for Short Term Goal #4     THN CM Short Term Goal #5      Interventions for Short Term Goal #5       [facility] CM Care Plan Problem Two     Most Recent Value  Care Plan Problem Two         [name], MSW, [place] Worker  [place] [phone]

## 2016-11-10 NOTE — Patient Outreach (Signed)
[facility]) Care Management  11/10/2016  [name] [name] 1931-02-13 [id number]  Transition of care call  Placed call to patient she answers phone, HIPAA information verified and she states I need you to come and help me get straight with the medications and everything  I have to do at home.  Patient placed her son [name] [name] on the phone and he is unsure of medications patient needs to take today, also asking about patient getting a hospital bed for home, patient had a slip to floor while trying to get into bed.    Plan Will plan home visit on today, to help with transition home.   [name], RN, [place] Management Coordinator  [phone]- Mobile [phone]- Toll Free Main Office

## 2016-11-12 DIAGNOSIS — I5042 Chronic combined systolic (congestive) and diastolic (congestive) heart failure: Secondary | ICD-10-CM | POA: Diagnosis not present

## 2016-11-12 DIAGNOSIS — C541 Malignant neoplasm of endometrium: Secondary | ICD-10-CM | POA: Diagnosis not present

## 2016-11-12 DIAGNOSIS — L89612 Pressure ulcer of right heel, stage 2: Secondary | ICD-10-CM | POA: Diagnosis not present

## 2016-11-12 DIAGNOSIS — R29898 Other symptoms and signs involving the musculoskeletal system: Secondary | ICD-10-CM | POA: Diagnosis not present

## 2016-11-12 DIAGNOSIS — I110 Hypertensive heart disease with heart failure: Secondary | ICD-10-CM | POA: Diagnosis not present

## 2016-11-12 DIAGNOSIS — E1122 Type 2 diabetes mellitus with diabetic chronic kidney disease: Secondary | ICD-10-CM | POA: Diagnosis not present

## 2016-11-12 DIAGNOSIS — N183 Chronic kidney disease, stage 3 (moderate): Secondary | ICD-10-CM | POA: Diagnosis not present

## 2016-11-12 DIAGNOSIS — I69398 Other sequelae of cerebral infarction: Secondary | ICD-10-CM | POA: Diagnosis not present

## 2016-11-12 DIAGNOSIS — L89622 Pressure ulcer of left heel, stage 2: Secondary | ICD-10-CM | POA: Diagnosis not present

## 2016-11-13 ENCOUNTER — Other Ambulatory Visit: Payer: Self-pay | Admitting: *Deleted

## 2016-11-13 ENCOUNTER — Other Ambulatory Visit: Payer: Self-pay | Admitting: Pharmacist

## 2016-11-13 DIAGNOSIS — N184 Chronic kidney disease, stage 4 (severe): Secondary | ICD-10-CM | POA: Diagnosis not present

## 2016-11-13 DIAGNOSIS — R531 Weakness: Secondary | ICD-10-CM | POA: Diagnosis not present

## 2016-11-13 DIAGNOSIS — I509 Heart failure, unspecified: Secondary | ICD-10-CM | POA: Diagnosis not present

## 2016-11-13 DIAGNOSIS — Z6835 Body mass index (BMI) 35.0-35.9, adult: Secondary | ICD-10-CM | POA: Diagnosis not present

## 2016-11-13 DIAGNOSIS — C541 Malignant neoplasm of endometrium: Secondary | ICD-10-CM | POA: Diagnosis not present

## 2016-11-13 DIAGNOSIS — R54 Age-related physical debility: Secondary | ICD-10-CM | POA: Diagnosis not present

## 2016-11-13 DIAGNOSIS — I10 Essential (primary) hypertension: Secondary | ICD-10-CM | POA: Diagnosis not present

## 2016-11-13 DIAGNOSIS — E113593 Type 2 diabetes mellitus with proliferative diabetic retinopathy without macular edema, bilateral: Secondary | ICD-10-CM | POA: Diagnosis not present

## 2016-11-13 NOTE — Patient Outreach (Signed)
[facility]) Care Management  11/13/2016  [name] [name] 07/11/1931 [id number]   Care coordination call received from [place] regarding [name] [name].  [name] [name] has returned home instead of being placed in long-term-care as originally planned and has medication management needs, specifically with her insulin.    Unsuccessful call to [name] [name] today.  Phone line is busy with multiple attempts to call and unable to leave a voicemail.   Successful call to patient's daughter, [name] [name].  HIPAA identifiers verified. [name] [name] states she and her 3 siblings all take turns to help their mother in the evenings after they are finished working. She reports during the day, her aunt, [name] [name], assists [name] [name].  [name] [name] gave permission for me to speak with her siblings regarding [name] [name] as needed.    Plan: Follow-up with [name] [name] next Monday to schedule a home visit to review medication needs after hospitalization.  Ralene Bathe, PharmD, [place] [phone]    s

## 2016-11-13 NOTE — Patient Outreach (Signed)
[facility]) Care Management  11/13/2016  [name] [name] December 17, 1931 [id number]  Transition of care follow up  PCP office visit with patient, patient discussed she thought she was feeling weaker because of her cancer.She continues to deny any vaginal bleeding at this time. Patient asking about when her next appointment regarding her cancer, she discussed the doctor may need to meet together with family to decide about what to do next, not sure about the radiation .  PCP discussed with patient her recent problem with anemia, may  cause weakness, discussed her recent blood transfusion when hospitalized. [facility] had initial home visit on 10/7, patient unsure of next visit but states several people will be seeing her at home.    Discussed with PCP , patient / family request for hospital bed, discussed family support regarding helping with medication and blood sugar monitoring twice daily, discussed Care Connection program for support with patient and PCP.    Medication review by PCP, requested office visit notes and medication be sent to [facility] office.  Labs rechecked at visit.   Plan Will discuss with Dian Situ pharmacist regarding today's office visit and , medication list to be sent to [facility] office Will place call to patient daughter [name]  regarding patient question regarding next visit regarding cancer, and [place] oncology referral office attempt to contact patient for appointment while she was hospitalized.  Will follow up with patient in the next week by telephone.   [name], RN, [place] Management Coordinator  [phone]- Mobile [phone]- Toll Free Main Office

## 2016-11-13 NOTE — Patient Outreach (Signed)
[facility]) Care Management  11/13/2016  [name] [name] 09-27-1931 [id number]   Successful telephone call with [name] [name] today. HIPAA identifiers verified. [name] [name] is agreeable to a visit to review her medications now that she is home from [facility].  She requests a visit tomorrow if possible.  I updated patient that her application for her insulin still needs her financial information to be complete.  She will look for her tax information tonight so we can complete this tomorrow during home visit.   Plan: Home visit with [name] [name] tomorrow, November 14, 2016 at her residence in [place], [place] at 10:00AM.   Ralene Bathe, PharmD, [place] [phone]

## 2016-11-14 ENCOUNTER — Other Ambulatory Visit: Payer: Self-pay | Admitting: Pharmacist

## 2016-11-14 ENCOUNTER — Other Ambulatory Visit: Payer: Self-pay | Admitting: *Deleted

## 2016-11-14 NOTE — Patient Outreach (Signed)
[place] [facility]) Care Management  11/14/2016  [name] [name] 03-Feb-1932 [id number]   Care Coordination   Placed call to patient daughter to provide contact number regarding setting up consultation appointment for referral to radiation therapy at patient request.  Provided [name] phone number listed in [facility] for Dr.Jones [phone] radiation oncology , she discussed she was aware of referral but patient was in [facility], she discussed she will help with  whatever patient decides to do.    [name], RN, [place] Management Coordinator  [phone]- Mobile [phone]- Toll Free Main Office
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
## 2016-11-14 NOTE — Patient Outreach (Signed)
[facility]) Care Management  [address]   11/14/2016  CERENA BAINE 26-Oct-1931 [id number]  Successful home visit with Ms. Kagel today at her home for medication assistance and medication management.  [employer] [employer] CNA, spouse Jason Fila, and Saddie Benders, sister in law, also present during visit.  HIPAA identifiers verified.   Subjective: Patient reports she is not feeling good today.  She reports having trouble with her glucometer (Accu-check Aviva) and is unable to check her blood sugars.  She reports she has not had any of her medications yet today.  A stack of 4 individual blister-pack cards of medications along with several pill bottles and an empty weekly pillbox is on the kitchen table.  A plastic set of drawers is in the kitchen with diabetic testing supplies.   CNA reports she cannot assist with bathing or direct patient care needs until patient is re-assessed by an RN at her company which was requested this morning.  If approved, CNA will be able to provide 1.5 hours of service two days a week.    Objective:   Medications Reviewed Today    Reviewed by Rudean Haskell, RPH (Pharmacist) on 11/14/16 at 1123  Med List Status: <None>  Medication Order Taking? Sig Documenting Provider Last Dose Status Informant  ACCU-CHEK SOFTCLIX LANCETS lancets [id number] Yes CHECK BLOOD SUGARS TWICE DAILY  Patient taking differently:  TID and PRN   Arlis Porta., MD Taking Active Self  acetaminophen (TYLENOL) 325 MG tablet [id number] Yes Take 650 mg by mouth every 6 (six) hours as needed for mild pain. [provider] Taking Active Self  amLODipine (NORVASC) 5 MG tablet [id number] Yes Take 5 mg by mouth daily. [provider] Taking Active   aspirin 81 MG tablet [id number] Yes Take 81 mg by mouth daily. [provider] Taking Active Self        Discontinued 11/14/16 1122 (Completed Course)   carvedilol (COREG) 6.25  MG tablet [id number] Yes Take 6.25 mg by mouth 2 (two) times daily with a meal. [provider] Taking Active Self        Discontinued 11/14/16 1038 (Completed Course)        Patient not taking:       Discontinued 11/14/16 1038 (Discontinued by provider)   furosemide (LASIX) 40 MG tablet [id number] Yes Take 80 mg by mouth daily. Taking 80 mg daily [provider] Taking Active Self  hydroxypropyl methylcellulose / hypromellose (ISOPTO TEARS / GONIOVISC) 2.5 % ophthalmic solution [id number] Yes Place 1 drop into both eyes as needed for dry eyes. [provider] Taking Active   insulin lispro (HUMALOG) 100 UNIT/ML injection [id number] Yes Inject 3-10 Units into the skin 4 (four) times daily -  with meals and at bedtime. [provider] Taking Active   Insulin NPH Isophane & Regular (NOVOLIN 70/30 [place]) [id number] Yes Inject 52 Units into the skin 2 (two) times daily with a meal. 52 units every morning  52 units every evening [provider] Taking Active Self           Med Note Bertell Maria, KIMBERLY A   Fri Nov 10, 2016  3:12 PM)    Insulin Pen Needle (RELION SHORT PEN NEEDLES) 31G X 8 MM MISC [id number] Yes 1 each by Does not apply route 2 (two) times daily. Arlis Porta., MD Taking Active Self  megestrol (MEGACE) 400 MG/10ML suspension [id number] Yes Take 400 mg by mouth 2 (two) times daily.  [provider] Taking Active         Discontinued 11/14/16 1121 (Completed Course)   polyethylene glycol (MIRALAX / GLYCOLAX) packet [id number] Yes USE 1-2 PACKETS DAILY  Patient taking differently:  use once daily PRN   Luan Pulling Ronelle Nigh., MD Taking Active Self           Med Note Iva Lento, [place] Nov 14, 2016 11:21 AM)    pravastatin (PRAVACHOL) 40 MG tablet [id number] Yes TAKE 1 TABLET BY MOUTH AT BEDTIME Arlis Porta., MD Taking Active Self  ranitidine (ZANTAC) 150 MG tablet [id number] Yes Take 150 mg by mouth 2 (two) times daily.  [provider] Taking Active Self  senna (SENOKOT) 8.6 MG TABS tablet [id number] Yes Take 2 tablets by mouth at bedtime. [provider] Taking Active   Med List Note Isidor Holts, [facility] 10/05/16 1423): [place]?           Functional Status: In your present state of health, do you have any difficulty performing the following activities: 11/10/2016 10/10/2016  Hearing? N N  Vision? Y N  Difficulty concentrating or making decisions? Y Y  Comment - -  Walking or climbing stairs? Y Y  Dressing or bathing? [name]  Comment has family , regional consolidate services  -  Doing errands, shopping? Y -  Comment family assistance  -  Conservation officer, nature and eating ? Y -  Comment family assist -  Using the Toilet? Y -  In the past six months, have you accidently leaked urine? Y -  Comment - -  Do you have problems with loss of bowel control? N -  Managing your Medications? Y -  Managing your Finances? Y -  Comment family helps  -  Housekeeping or managing your Housekeeping? Y -  Comment community agency assist with family  -  Some recent data might be hidden    Fall/Depression Screening: Fall Risk  11/10/2016 09/07/2016 06/21/2016  Falls in the past year? Yes Yes Yes  Number falls in past yr: 2 or more 1 1  Injury with Fall? No No No  Risk Factor Category  High Fall Risk - -  Risk for fall due to : History of fall(s);Impaired balance/gait;Impaired mobility History of fall(s) Impaired balance/gait;Impaired mobility  Follow up Falls evaluation completed;Falls prevention discussed Falls evaluation completed;Falls prevention discussed Falls evaluation completed;Falls prevention discussed;Education provided   [facility] 2/9 Scores 11/10/2016 09/07/2016 06/21/2016 05/26/2016 04/05/2016 12/28/2015 11/22/2015  PHQ - 2 Score 1 1 1 1 1  0 1    Assessment:  Drugs sorted by system:  Cardiovascular: amlodipine, aspirin 81mg , carvedilol, furosemide, pravastatin  Gastrointestinal: Senna,  polyethylene glycol, ranitidine  Endocrine: Humalog, Novolin 70/30, megestrol  Topical: artificial tears eye drops  Pain: PRN acetaminophen  Medication Issues 1. Several medication discrepancies noted with medications. I called PCP office with Charlott Holler and verified current medication list.    Amlodipine on current medication list but patient not currently does not have any at home.  Prescription on hold at [facility].   Plavix on current medication list however this was discontinued several months ago due to uterine bleeding.  I removed this from EMR.  Megace suspension bottle, unopened, in kitchen, however this is not on current medication list.  PCP office confirms this should be on active medication list. I added this to EMR.   Lisinopril bottle in kitchen, recently filled, however not on current medication list  and should be discontinued per PCP.  I removed this from EMR.   Ranitidine blisterpack instructions differ from current medication list.  I updated SIG per PCP directions.    No longer taking melatonin or docusate.  I removed these from EMR.   2. Patient not taking medications correctly due to confusion with several blister packs from [employer] and pill bottles from various pharmacies.  Prior to hospitalization, patient had been receiving compliance packaged medications in a single blister-pack card from [facility].    I spoke with [facility] and requested a delivery of medications including amlodipine, aspirin, carvedilol, furosemide, pravastatin, and ranitidine  in compliance packaging to patient as soon as possible.  A delivery will be made to patient on Wednesday, Oct 10th.  I removed the old medications and left patient with a 2 day supply through Oct 10th.  Patient will start her compliance packs from pharmacy on Thursday, Oct 11. Patient and family voiced understanding.    3. Patient is not checking her CBGs regularly as glucometer is not  working and is not using her SSI.   Patient already has new meter in home, [employer], with test strips and lancets. I reviewed the new glucometer with patient and family.  Patient able to check her blood sugar during visit with new meter.  I reviewed sliding scale insulin instructions with patient and family who voiced understanding.    4.  Patient and spouse unable to find most recent social security statements or tax forms to complete medication assistance applications for insulin.  I left a message with Oneta Rack, son, and [name], daughter, and tried to call Nicole Kindred and Hildred Priest but was unable to leave messages with them.  PAP will accept a bank statement if patient able to provide this financial information.   Plan: I will follow-up with patient tomorrow to ensure she has received her medications from [facility], is checking her CBGs, and  will request a bank statement for PAP.  I will route my note to PCP.    Ralene Bathe, PharmD, [place] [phone]

## 2016-11-15 ENCOUNTER — Ambulatory Visit: Payer: Self-pay | Admitting: Pharmacist

## 2016-11-15 ENCOUNTER — Other Ambulatory Visit: Payer: Self-pay | Admitting: Pharmacist

## 2016-11-15 DIAGNOSIS — I110 Hypertensive heart disease with heart failure: Secondary | ICD-10-CM | POA: Diagnosis not present

## 2016-11-15 DIAGNOSIS — C541 Malignant neoplasm of endometrium: Secondary | ICD-10-CM | POA: Diagnosis not present

## 2016-11-15 DIAGNOSIS — I69398 Other sequelae of cerebral infarction: Secondary | ICD-10-CM | POA: Diagnosis not present

## 2016-11-15 DIAGNOSIS — R29898 Other symptoms and signs involving the musculoskeletal system: Secondary | ICD-10-CM | POA: Diagnosis not present

## 2016-11-15 DIAGNOSIS — N183 Chronic kidney disease, stage 3 (moderate): Secondary | ICD-10-CM | POA: Diagnosis not present

## 2016-11-15 DIAGNOSIS — I5042 Chronic combined systolic (congestive) and diastolic (congestive) heart failure: Secondary | ICD-10-CM | POA: Diagnosis not present

## 2016-11-15 DIAGNOSIS — L89612 Pressure ulcer of right heel, stage 2: Secondary | ICD-10-CM | POA: Diagnosis not present

## 2016-11-15 DIAGNOSIS — E1122 Type 2 diabetes mellitus with diabetic chronic kidney disease: Secondary | ICD-10-CM | POA: Diagnosis not present

## 2016-11-15 DIAGNOSIS — L89622 Pressure ulcer of left heel, stage 2: Secondary | ICD-10-CM | POA: Diagnosis not present

## 2016-11-15 NOTE — Patient Outreach (Signed)
[facility]) Care Management  11/15/2016  ANAELI CORNWALL November 05, 1931 [id number]   Incoming call received from Ms. [name]'s daughters, Hildred Priest and [name].  I updated daughters on patient's medications now coming from [facility] as before in a compliance pack.  I also requested bank statement showing social security income for PAPs.  Daughters expressed understanding and will attempt to get financial information.   Care coordination call placed to [facility].  Compliance package will be delivered today between 2-4PM.  Total cost = $15.84.  They will also include a box of alcohol wipes as patient is running low at home.   Successful call to patient today. HIPAA identifiers verified. I reviewed with Ms. Schiano that her medication will be delivered today and she can start it tomorrow morning. I reviewed with her to take the megace oral suspension BID in addition to her oral pills in the compliance package.  Plan: I will follow-up with patient next week regarding her medication adherence.   Ralene Bathe, PharmD, [place] [phone]

## 2016-11-16 ENCOUNTER — Other Ambulatory Visit: Payer: Self-pay | Admitting: Pharmacist

## 2016-11-17 DIAGNOSIS — L89622 Pressure ulcer of left heel, stage 2: Secondary | ICD-10-CM | POA: Diagnosis not present

## 2016-11-17 DIAGNOSIS — C541 Malignant neoplasm of endometrium: Secondary | ICD-10-CM | POA: Diagnosis not present

## 2016-11-17 DIAGNOSIS — E1122 Type 2 diabetes mellitus with diabetic chronic kidney disease: Secondary | ICD-10-CM | POA: Diagnosis not present

## 2016-11-17 DIAGNOSIS — I69398 Other sequelae of cerebral infarction: Secondary | ICD-10-CM | POA: Diagnosis not present

## 2016-11-17 DIAGNOSIS — N183 Chronic kidney disease, stage 3 (moderate): Secondary | ICD-10-CM | POA: Diagnosis not present

## 2016-11-17 DIAGNOSIS — R29898 Other symptoms and signs involving the musculoskeletal system: Secondary | ICD-10-CM | POA: Diagnosis not present

## 2016-11-17 DIAGNOSIS — I110 Hypertensive heart disease with heart failure: Secondary | ICD-10-CM | POA: Diagnosis not present

## 2016-11-17 DIAGNOSIS — I5042 Chronic combined systolic (congestive) and diastolic (congestive) heart failure: Secondary | ICD-10-CM | POA: Diagnosis not present

## 2016-11-17 DIAGNOSIS — L89612 Pressure ulcer of right heel, stage 2: Secondary | ICD-10-CM | POA: Diagnosis not present

## 2016-11-20 ENCOUNTER — Ambulatory Visit: Payer: Self-pay | Admitting: *Deleted

## 2016-11-21 ENCOUNTER — Other Ambulatory Visit: Payer: Self-pay | Admitting: *Deleted

## 2016-11-21 ENCOUNTER — Other Ambulatory Visit: Payer: Self-pay | Admitting: Pharmacist

## 2016-11-21 ENCOUNTER — Ambulatory Visit: Payer: Self-pay | Admitting: Pharmacist

## 2016-11-21 DIAGNOSIS — C541 Malignant neoplasm of endometrium: Secondary | ICD-10-CM | POA: Diagnosis not present

## 2016-11-21 DIAGNOSIS — I69398 Other sequelae of cerebral infarction: Secondary | ICD-10-CM | POA: Diagnosis not present

## 2016-11-21 DIAGNOSIS — R29898 Other symptoms and signs involving the musculoskeletal system: Secondary | ICD-10-CM | POA: Diagnosis not present

## 2016-11-21 DIAGNOSIS — I110 Hypertensive heart disease with heart failure: Secondary | ICD-10-CM | POA: Diagnosis not present

## 2016-11-21 DIAGNOSIS — I5042 Chronic combined systolic (congestive) and diastolic (congestive) heart failure: Secondary | ICD-10-CM | POA: Diagnosis not present

## 2016-11-21 DIAGNOSIS — N183 Chronic kidney disease, stage 3 (moderate): Secondary | ICD-10-CM | POA: Diagnosis not present

## 2016-11-21 DIAGNOSIS — E1122 Type 2 diabetes mellitus with diabetic chronic kidney disease: Secondary | ICD-10-CM | POA: Diagnosis not present

## 2016-11-21 DIAGNOSIS — L89612 Pressure ulcer of right heel, stage 2: Secondary | ICD-10-CM | POA: Diagnosis not present

## 2016-11-21 DIAGNOSIS — L89622 Pressure ulcer of left heel, stage 2: Secondary | ICD-10-CM | POA: Diagnosis not present

## 2016-11-21 NOTE — Patient Outreach (Addendum)
[facility]) Care Management  11/21/2016  ADASYN MCADAMS 03-29-1931 [id number]  Transition of care call   Placed call to patient home, person answering phone identified self as her daughter [name], HIPAA information verified. [name] reports patient is currently sitting in chair sleeping on and off during the day. [name] reports patient's husband is not currently staying in the the home and family has been staying to provide 24 hours care in home. Daughter reports patient has received pill packaging medications and family assisting with medication including insulin and checking blood sugar, blood sugar this morning 137.   Home health RN has visited on today and plan bath aide on tomorrow.  Daughter reports patient son is looking into other resources for a hospital bed, other than DME company.   Daughter discussed looking into other options for patient care, she has requested contact number for [facility] in [place], she planning on calling today to inquire regarding cost.    Plan Will continue weekly outreach, plan home visit within the week.  Placed call to [facility] to follow up , able to speak with [name] that will begin review . Will update [name], LCSW    [name], RN, [place] Management Coordinator  [phone]- Mobile [phone]- Toll Free Main Office

## 2016-11-21 NOTE — Patient Outreach (Signed)
[name]) Care Management  11/21/2016  [name] [name] 07-Jan-1932 [id number]   Successful call to [name] at her home today. HIPAA identifiers verified. Patient states she doesn't have much time to talk today as someone is there to help her exercise.  She reports she is doing much better with her medications and requests that I call her tomorrow morning.    Plan: I will follow-up with [name] telephonically tomorrow morning.   Ralene Bathe, PharmD, [place] [phone]    s

## 2016-11-22 ENCOUNTER — Other Ambulatory Visit: Payer: Self-pay | Admitting: Pharmacist

## 2016-11-22 ENCOUNTER — Other Ambulatory Visit: Payer: Self-pay | Admitting: *Deleted

## 2016-11-22 DIAGNOSIS — I110 Hypertensive heart disease with heart failure: Secondary | ICD-10-CM | POA: Diagnosis not present

## 2016-11-22 DIAGNOSIS — N183 Chronic kidney disease, stage 3 (moderate): Secondary | ICD-10-CM | POA: Diagnosis not present

## 2016-11-22 DIAGNOSIS — I5042 Chronic combined systolic (congestive) and diastolic (congestive) heart failure: Secondary | ICD-10-CM | POA: Diagnosis not present

## 2016-11-22 DIAGNOSIS — C541 Malignant neoplasm of endometrium: Secondary | ICD-10-CM | POA: Diagnosis not present

## 2016-11-22 DIAGNOSIS — L89622 Pressure ulcer of left heel, stage 2: Secondary | ICD-10-CM | POA: Diagnosis not present

## 2016-11-22 DIAGNOSIS — I69398 Other sequelae of cerebral infarction: Secondary | ICD-10-CM | POA: Diagnosis not present

## 2016-11-22 DIAGNOSIS — E1122 Type 2 diabetes mellitus with diabetic chronic kidney disease: Secondary | ICD-10-CM | POA: Diagnosis not present

## 2016-11-22 DIAGNOSIS — R29898 Other symptoms and signs involving the musculoskeletal system: Secondary | ICD-10-CM | POA: Diagnosis not present

## 2016-11-22 DIAGNOSIS — L89612 Pressure ulcer of right heel, stage 2: Secondary | ICD-10-CM | POA: Diagnosis not present

## 2016-11-22 NOTE — Patient Outreach (Signed)
[facility]) Care Management  11/22/2016  RONNETTE RUMP 18-Jun-1931 [id number]  Incoming call and voicemail received last night from Ms. [name]'s daughter, [name].    11:29AM Successful telephone call to Ms. [name]'s residence today.  I spoke with Saddie Benders, sister-in-law and [facility] RN [name].  HIPAA identifiers verified.    Saddie Benders reports that Ms. [name] is doing very well with her medications.  She reports that she personally helps Ms. [name] take her morning medications and that a family member helps her take her evening and bedtime medications, including the megace suspension and insulin.  She also reports that the patient is having her blood sugars checked daily.    [facility] RN [name] reports that patient has not missed any doses of her medications per review of compliance pack.  Pack arrived last Wednesday afternoon and family started using that evening.  [facility] RN will leave out week #2 compliance pack for family to start using tonight.    11:43AM Successful telephone call to [name], Ms. Riecke daughter.  [name] reports that her mother is doing well with her medications.  She requests that she will call me back after she arrives at her mother's home later this afternoon.    Plan: I will await call back from Judy to review patient's medications.   Addendum: I received a phone call from [place] and returned her call.  HIPAA identifiers verified.  We reviewed the compliance packs and how to start week #2 tonight and move through the rest of the week.  [name] reports she understands how to use the weekly compliance packs after our discussion.  She reports she has been giving her mother her oral megace suspension each night.  She also reports she asked Jason Fila to provide the most recent financial information for the medication PAPs but has not received this yet from him.  She will call me if she does obtain this information.    Plan: I will follow-up with [name] and Ms. [name] in 3 weeks to assess medication adherence with the compliance packs and to inquire again about PAP financial information.   Ralene Bathe, PharmD, [place] [phone]

## 2016-11-22 NOTE — Patient Outreach (Addendum)
[facility] [facility]) Care Management   11/22/2016  [name] [name] Jun 14, 1931 [id number]  [name] [name] is an 83 y.o. female  Subjective:  Reports feeling pretty good on today, feeling more to myself Patient discussed  that her husband/ his son does not want hospital bed in home.   Objective:  BP 130/70   Pulse 76   Resp 18   SpO2 96%  Review of Systems  Constitutional: Negative.   HENT: Negative.   Eyes: Negative.   Respiratory: Negative.   Cardiovascular: Positive for leg swelling.  Gastrointestinal: Negative.   Genitourinary: Negative.   Musculoskeletal: Negative.   Skin: Negative.   Neurological:       Generalized weakness   Endo/Heme/Allergies: Negative.   Psychiatric/Behavioral:       Forgetfulness ,     Physical Exam  Constitutional: She is oriented to person, place, and time. She appears well-developed and well-nourished.  Cardiovascular: Normal rate and normal heart sounds.   Respiratory: Effort normal.  GI: Soft. Bowel sounds are normal.  Neurological: She is alert and oriented to person, place, and time.  Skin: Skin is warm and dry.     Psychiatric: She has a normal mood and affect. Her behavior is normal. Thought content normal.    Encounter Medications:   Outpatient Encounter Prescriptions as of 11/22/2016  Medication Sig  . ACCU-CHEK SOFTCLIX LANCETS lancets CHECK BLOOD SUGARS TWICE DAILY (Patient taking differently: TID and PRN)  . acetaminophen (TYLENOL) 325 MG tablet Take 650 mg by mouth every 6 (six) hours as needed for mild pain.  [name] amLODipine (NORVASC) 5 MG tablet Take 5 mg by mouth daily.  [name] aspirin 81 MG tablet Take 81 mg by mouth daily.  . carvedilol (COREG) 6.25 MG tablet Take 6.25 mg by mouth 2 (two) times daily with a meal.  . furosemide (LASIX) 40 MG tablet Take 80 mg by mouth daily. Taking 80 mg daily  . hydroxypropyl methylcellulose / hypromellose (ISOPTO TEARS / GONIOVISC) 2.5 % ophthalmic solution Place 1  drop into both eyes as needed for dry eyes.  . insulin lispro (HUMALOG) 100 UNIT/ML injection Inject 3-10 Units into the skin 4 (four) times daily -  with meals and at bedtime. 100-200: 2 units; 201-250: 4 units; 251-300: 6 units; 301-350: 8 units; 351-400: 10 units; > 400: contact MD  . Insulin NPH Isophane & Regular (NOVOLIN 70/30 [place]) Inject 52 Units into the skin 2 (two) times daily with a meal. 52 units every morning  52 units every evening  . Insulin Pen Needle (RELION SHORT PEN NEEDLES) 31G X 8 MM MISC 1 each by Does not apply route 2 (two) times daily.  . megestrol (MEGACE) 400 MG/10ML suspension Take 400 mg by mouth 2 (two) times daily.  . polyethylene glycol (MIRALAX / GLYCOLAX) packet USE 1-2 PACKETS DAILY (Patient taking differently: use once daily PRN)  . pravastatin (PRAVACHOL) 40 MG tablet TAKE 1 TABLET BY MOUTH AT BEDTIME  . ranitidine (ZANTAC) 150 MG tablet Take 150 mg by mouth 2 (two) times daily.   [name] senna (SENOKOT) 8.6 MG TABS tablet Take 2 tablets by mouth at bedtime.   No facility-administered encounter medications on file as of 11/22/2016.     Functional Status:   In your present state of health, do you have any difficulty performing the following activities: 11/10/2016 10/10/2016  Hearing? N N  Vision? Y N  Difficulty concentrating or making decisions? Y Y  Comment - -  Walking or climbing stairs?  Y Y  Dressing or bathing? [name]  Comment has family , regional consolidate services  -  Doing errands, shopping? Y -  Comment family assistance  -  Conservation officer, nature and eating ? Y -  Comment family assist -  Using the Toilet? Y -  In the past six months, have you accidently leaked urine? Y -  Comment - -  Do you have problems with loss of bowel control? N -  Managing your Medications? Y -  Managing your Finances? Y -  Comment family helps  -  Housekeeping or managing your Housekeeping? Y -  Comment community agency assist with family  -  Some recent data might be hidden      Fall/Depression Screening:    Fall Risk  11/10/2016 09/07/2016 06/21/2016  Falls in the past year? Yes Yes Yes  Number falls in past yr: 2 or more 1 1  Injury with Fall? No No No  Risk Factor Category  High Fall Risk - -  Risk for fall due to : History of fall(s);Impaired balance/gait;Impaired mobility History of fall(s) Impaired balance/gait;Impaired mobility  Follow up Falls evaluation completed;Falls prevention discussed Falls evaluation completed;Falls prevention discussed Falls evaluation completed;Falls prevention discussed;Education provided   PHQ 2/9 Scores 11/10/2016 09/07/2016 06/21/2016 05/26/2016 04/05/2016 12/28/2015 11/22/2015  PHQ - 2 Score 1 1 1 1 1  0 1    Assessment:  Routine home visit, sister in law [name] [name] present.   Diabetes -  This am blood sugar 290 , and 7 day average 237. Family assisting with monitoring blood sugar, giving as  medications as prescribed, no missed dosages in noted,  blister pack with missing evening dose for today, reviewed with [name] [name], Pharmacist over telephone, and patient to begin new card of blister pack medication beginning this evening, explained this to sister in law [name] [name] that is with patient at this time she verbalized understanding and note placed on package. Family present reports to her knowledge patient has not received sliding scale insulin .  Endometrial Cancer- Patient wants to pursue information on radiation treatment for cancer to help her with making a decision. No vaginal bleeding.   Social - family assisting with care at home, health RN /PT twice weekly, bath aide twice weekly, and regional consolidated services bath/home care twice weekly.Discussed care connection resource with patient.   Fall Risk-  No recent falls, review fall precautions, need follow up regarding hospital bed for home. Patient wearing medical alert button.   Plan:  Will continue weekly transition of care follow up, next call in a week.  1.Provided EMMi  handout on hyperglycemia and hypoglycemia. 2.Will follow up with patient daughter [name] [name], regarding patient wanting follow up related to cancer  Provided [facility] handout on hyperglycemia and hypoglycemia. 3. Will collaborate with [facility] LCSW , [name] [name] regarding further plans  4. Reinforced fall prevention measures     THN CM Care Plan Problem One     Most Recent Value  Care Plan Problem One  Recent hospitalization related to hyperglycemia   Role Documenting the Problem One  Care Management [place] for Problem One  Active  THN Long Term Goal   Patient will not experience a hospital admission in the next 31 days   THN Long Term Goal Start Date  11/10/16  Interventions for Problem One Long Term Goal  Reinforced importance of attending all medical appointments, taking medications as prescribed,   THN CM Short Term Goal #1   Patient will  report checking blood sugar at least twice daily in the next 31 days   THN CM Short Term Goal #1 Start Date  11/10/16  Interventions for Short Term Goal #1  Reviewed recent readings on meter and reviewed with family importance of continuing monitoring   THN CM Short Term Goal #2   Patient will report no falls in the next 30 days   THN CM Short Term Goal #2 Start Date  11/10/16  Interventions for Short Term Goal #2  Reviewed fall prevention measures, reinforced continuing participation in physical therapy exercise   THN CM Short Term Goal #3  Patient will be able to report symptoms and treatment of hypoglycemia in the next 30 days   THN CM Short Term Goal #3 Start Date  11/22/16  Interventions for Short Tern Goal #3  Provided with EMMI on hypoglycemia/hyperglycemia, keeping a quiick sugar snack nearby for symptoms, reviewed rule of 15 with caregiver       [name], RN, [place] Management Coordinator  [phone]- Mobile [phone]- [place]

## 2016-11-22 NOTE — Patient Outreach (Signed)
[place] [facility]) Care Management  [facility] Social Work  11/22/2016  [name] [name] 31-Dec-1931 [id number]  Subjective:  "We are doing the best we can"  Objective: CSW to assist patient with commuity based resources to aide in her well-being, quality of life and overall safety/needs.    Encounter Medications:  Outpatient Encounter Prescriptions as of 11/22/2016  Medication Sig  . ACCU-CHEK SOFTCLIX LANCETS lancets CHECK BLOOD SUGARS TWICE DAILY (Patient taking differently: TID and PRN)  . acetaminophen (TYLENOL) 325 MG tablet Take 650 mg by mouth every 6 (six) hours as needed for mild pain.  [name] amLODipine (NORVASC) 5 MG tablet Take 5 mg by mouth daily.  [name] aspirin 81 MG tablet Take 81 mg by mouth daily.  . carvedilol (COREG) 6.25 MG tablet Take 6.25 mg by mouth 2 (two) times daily with a meal.  . furosemide (LASIX) 40 MG tablet Take 80 mg by mouth daily. Taking 80 mg daily  . hydroxypropyl methylcellulose / hypromellose (ISOPTO TEARS / GONIOVISC) 2.5 % ophthalmic solution Place 1 drop into both eyes as needed for dry eyes.  . insulin lispro (HUMALOG) 100 UNIT/ML injection Inject 3-10 Units into the skin 4 (four) times daily -  with meals and at bedtime. 100-200: 2 units; 201-250: 4 units; 251-300: 6 units; 301-350: 8 units; 351-400: 10 units; > 400: contact MD  . Insulin NPH Isophane & Regular (NOVOLIN 70/30 [place]) Inject 52 Units into the skin 2 (two) times daily with a meal. 52 units every morning  52 units every evening  . Insulin Pen Needle (RELION SHORT PEN NEEDLES) 31G X 8 MM MISC 1 each by Does not apply route 2 (two) times daily.  . megestrol (MEGACE) 400 MG/10ML suspension Take 400 mg by mouth 2 (two) times daily.  . polyethylene glycol (MIRALAX / GLYCOLAX) packet USE 1-2 PACKETS DAILY (Patient taking differently: use once daily PRN)  . pravastatin (PRAVACHOL) 40 MG tablet TAKE 1 TABLET BY MOUTH AT BEDTIME  . ranitidine (ZANTAC) 150 MG tablet Take 150 mg by mouth 2  (two) times daily.   [name] senna (SENOKOT) 8.6 MG TABS tablet Take 2 tablets by mouth at bedtime.   No facility-administered encounter medications on file as of 11/22/2016.     Functional Status:  In your present state of health, do you have any difficulty performing the following activities: 11/10/2016 10/10/2016  Hearing? N N  Vision? Y N  Difficulty concentrating or making decisions? Y Y  Comment - -  Walking or climbing stairs? Y Y  Dressing or bathing? [name]  Comment has family , regional consolidate services  -  Doing errands, shopping? Y -  Comment family assistance  -  Conservation officer, nature and eating ? Y -  Comment family assist -  Using the Toilet? Y -  In the past six months, have you accidently leaked urine? Y -  Comment - -  Do you have problems with loss of bowel control? N -  Managing your Medications? Y -  Managing your Finances? Y -  Comment family helps  -  Housekeeping or managing your Housekeeping? Y -  Comment community agency assist with family  -  Some recent data might be hidden    Fall/Depression Screening:  PHQ 2/9 Scores 11/10/2016 09/07/2016 06/21/2016 05/26/2016 04/05/2016 12/28/2015 11/22/2015  PHQ - 2 Score 1 1 1 1 1  0 1    Assessment:  CSW spoke to patient's daughter, [name], who reports they are all helping out to provide 24  hour care at the home. Per report,patient's husband is currently staying with one of his children after having a syncopal episode over the weekend at a choir event. [name] reports she and her siblings have been staying with mom and are looking to find someone to help- perhaps with the night shift.  CSW discussed the need to apply for Medicaid and has offered to visit the home and assist with online application if [name] and patient are willing. "[name]'s family is not gonna budge on giving up his information'. CSW discussed with [name] that if we proceed with the application with the information they have (Skyla's income, etc), that DSS can then pursue  obtaining the rest of the information from [place] and his family; and if they refuse to provide his financial profile, they can decide if APS should be involved.   CSW has offered to visit next Monday at the home and assist with Medicaid application.     Plan:   [facility] CM Care Plan Problem One     Most Recent Value  Care Plan Problem One  Patient hospitalized and in need of more support than home can provide.  Role Documenting the Problem One  Clinical Social Worker  Care Plan for Problem One  Active  [facility] Long Term Goal   Patient and family will arrange for adequate support/care in the home -vs_ LTC setting in the next 60 days.   THN Long Term Goal Start Date  11/10/16  Interventions for Problem One Long Term Goal  CSW aseessing and assisting with discussions related to Medicaid barriers and adequate care/support at home.   THN CM Short Term Goal #1    Patient's family will provide and assist with Medicaid application in the next 30 days.   THN CM Short Term Goal #1 Start Date  11/10/16  Interventions for Short Term Goal #1  CSW discussing needs, concerns and stressing for family to cooperate with info needed and to assist with Medicaid application.   THN CM Short Term Goal #2   Patient will have adequate in home care or be placed in LTC setting in the next 90 days.   THN CM Short Term Goal #2 Start Date  11/10/16  Interventions for Short Term Goal #2  CSW discussing needs, concerns and stressing for family to cooperate with info needed and to assist with Medicaid application.   THN CM Short Term Goal #3     Interventions for Short Tern Goal #3     THN CM Short Term Goal #4     Interventions for Short Term Goal #4     THN CM Short Term Goal #5      Interventions for Short Term Goal #5       [facility] CM Care Plan Problem Two     Most Recent Value  Care Plan Problem Two         [name], MSW, [place] Worker  [place] [phone]

## 2016-11-23 DIAGNOSIS — N183 Chronic kidney disease, stage 3 (moderate): Secondary | ICD-10-CM | POA: Diagnosis not present

## 2016-11-23 DIAGNOSIS — I69398 Other sequelae of cerebral infarction: Secondary | ICD-10-CM | POA: Diagnosis not present

## 2016-11-23 DIAGNOSIS — C541 Malignant neoplasm of endometrium: Secondary | ICD-10-CM | POA: Diagnosis not present

## 2016-11-23 DIAGNOSIS — R29898 Other symptoms and signs involving the musculoskeletal system: Secondary | ICD-10-CM | POA: Diagnosis not present

## 2016-11-23 DIAGNOSIS — L89612 Pressure ulcer of right heel, stage 2: Secondary | ICD-10-CM | POA: Diagnosis not present

## 2016-11-23 DIAGNOSIS — I5042 Chronic combined systolic (congestive) and diastolic (congestive) heart failure: Secondary | ICD-10-CM | POA: Diagnosis not present

## 2016-11-23 DIAGNOSIS — I110 Hypertensive heart disease with heart failure: Secondary | ICD-10-CM | POA: Diagnosis not present

## 2016-11-23 DIAGNOSIS — E1122 Type 2 diabetes mellitus with diabetic chronic kidney disease: Secondary | ICD-10-CM | POA: Diagnosis not present

## 2016-11-23 DIAGNOSIS — L89622 Pressure ulcer of left heel, stage 2: Secondary | ICD-10-CM | POA: Diagnosis not present

## 2016-11-24 DIAGNOSIS — E1122 Type 2 diabetes mellitus with diabetic chronic kidney disease: Secondary | ICD-10-CM | POA: Diagnosis not present

## 2016-11-24 DIAGNOSIS — I69398 Other sequelae of cerebral infarction: Secondary | ICD-10-CM | POA: Diagnosis not present

## 2016-11-24 DIAGNOSIS — C541 Malignant neoplasm of endometrium: Secondary | ICD-10-CM | POA: Diagnosis not present

## 2016-11-24 DIAGNOSIS — N183 Chronic kidney disease, stage 3 (moderate): Secondary | ICD-10-CM | POA: Diagnosis not present

## 2016-11-24 DIAGNOSIS — I5042 Chronic combined systolic (congestive) and diastolic (congestive) heart failure: Secondary | ICD-10-CM | POA: Diagnosis not present

## 2016-11-24 DIAGNOSIS — L89612 Pressure ulcer of right heel, stage 2: Secondary | ICD-10-CM | POA: Diagnosis not present

## 2016-11-24 DIAGNOSIS — I110 Hypertensive heart disease with heart failure: Secondary | ICD-10-CM | POA: Diagnosis not present

## 2016-11-24 DIAGNOSIS — L89622 Pressure ulcer of left heel, stage 2: Secondary | ICD-10-CM | POA: Diagnosis not present

## 2016-11-24 DIAGNOSIS — R29898 Other symptoms and signs involving the musculoskeletal system: Secondary | ICD-10-CM | POA: Diagnosis not present

## 2016-11-27 ENCOUNTER — Other Ambulatory Visit: Payer: Self-pay | Admitting: *Deleted

## 2016-11-27 DIAGNOSIS — I5042 Chronic combined systolic (congestive) and diastolic (congestive) heart failure: Secondary | ICD-10-CM | POA: Diagnosis not present

## 2016-11-27 DIAGNOSIS — L89612 Pressure ulcer of right heel, stage 2: Secondary | ICD-10-CM | POA: Diagnosis not present

## 2016-11-27 DIAGNOSIS — L89622 Pressure ulcer of left heel, stage 2: Secondary | ICD-10-CM | POA: Diagnosis not present

## 2016-11-27 DIAGNOSIS — N183 Chronic kidney disease, stage 3 (moderate): Secondary | ICD-10-CM | POA: Diagnosis not present

## 2016-11-27 DIAGNOSIS — R29898 Other symptoms and signs involving the musculoskeletal system: Secondary | ICD-10-CM | POA: Diagnosis not present

## 2016-11-27 DIAGNOSIS — E1122 Type 2 diabetes mellitus with diabetic chronic kidney disease: Secondary | ICD-10-CM | POA: Diagnosis not present

## 2016-11-27 DIAGNOSIS — C541 Malignant neoplasm of endometrium: Secondary | ICD-10-CM | POA: Diagnosis not present

## 2016-11-27 DIAGNOSIS — I110 Hypertensive heart disease with heart failure: Secondary | ICD-10-CM | POA: Diagnosis not present

## 2016-11-27 DIAGNOSIS — I69398 Other sequelae of cerebral infarction: Secondary | ICD-10-CM | POA: Diagnosis not present

## 2016-11-27 NOTE — Patient Outreach (Signed)
[place] [facility]) Care Management  [facility] Social Work  11/27/2016  COHEN DOLEMAN 1931/09/29 [id number]  Subjective:  "[name] [name] is doing ok"  Objective: CSW to assist patient with commuity based resources to aide in her well-being, quality of life and overall safety/needs.    Encounter Medications:  Outpatient Encounter Prescriptions as of 11/27/2016  Medication Sig Note  . ACCU-CHEK SOFTCLIX LANCETS lancets CHECK BLOOD SUGARS TWICE DAILY (Patient taking differently: TID and PRN)   . acetaminophen (TYLENOL) 325 MG tablet Take 650 mg by mouth every 6 (six) hours as needed for mild pain.   [name] amLODipine (NORVASC) 5 MG tablet Take 5 mg by mouth daily.   [name] aspirin 81 MG tablet Take 81 mg by mouth daily.   . carvedilol (COREG) 6.25 MG tablet Take 6.25 mg by mouth 2 (two) times daily with a meal.   . furosemide (LASIX) 40 MG tablet Take 80 mg by mouth daily. Taking 80 mg daily   . hydroxypropyl methylcellulose / hypromellose (ISOPTO TEARS / GONIOVISC) 2.5 % ophthalmic solution Place 1 drop into both eyes as needed for dry eyes.   . insulin lispro (HUMALOG) 100 UNIT/ML injection Inject 3-10 Units into the skin 4 (four) times daily -  with meals and at bedtime. 100-200: 2 units; 201-250: 4 units; 251-300: 6 units; 301-350: 8 units; 351-400: 10 units; > 400: contact MD 11/22/2016: Has on hand as needed  . Insulin NPH Isophane & Regular (NOVOLIN 70/30 [place]) Inject 52 Units into the skin 2 (two) times daily with a meal. 52 units every morning  52 units every evening   . Insulin Pen Needle (RELION SHORT PEN NEEDLES) 31G X 8 MM MISC 1 each by Does not apply route 2 (two) times daily.   . megestrol (MEGACE) 400 MG/10ML suspension Take 400 mg by mouth 2 (two) times daily.   . polyethylene glycol (MIRALAX / GLYCOLAX) packet USE 1-2 PACKETS DAILY (Patient taking differently: use once daily PRN)   . pravastatin (PRAVACHOL) 40 MG tablet TAKE 1 TABLET BY MOUTH AT BEDTIME   . ranitidine (ZANTAC)  150 MG tablet Take 150 mg by mouth 2 (two) times daily.    [name] senna (SENOKOT) 8.6 MG TABS tablet Take 2 tablets by mouth at bedtime. 11/22/2016: Takes as needed   No facility-administered encounter medications on file as of 11/27/2016.     Functional Status:  In your present state of health, do you have any difficulty performing the following activities: 11/10/2016 10/10/2016  Hearing? N N  Vision? Y N  Difficulty concentrating or making decisions? Y Y  Comment - -  Walking or climbing stairs? Y Y  Dressing or bathing? [name]  Comment has family , regional consolidate services  -  Doing errands, shopping? Y -  Comment family assistance  -  Conservation officer, nature and eating ? Y -  Comment family assist -  Using the Toilet? Y -  In the past six months, have you accidently leaked urine? Y -  Comment - -  Do you have problems with loss of bowel control? N -  Managing your Medications? Y -  Managing your Finances? Y -  Comment family helps  -  Housekeeping or managing your Housekeeping? Y -  Comment community agency assist with family  -  Some recent data might be hidden    Fall/Depression Screening:  PHQ 2/9 Scores 11/10/2016 09/07/2016 06/21/2016 05/26/2016 04/05/2016 12/28/2015 11/22/2015  PHQ - 2 Score 1 1 1 1 1  0 1  Assessment:  CSW spoke with patient's daughter, [name] [name] today by phone who reports her mother is being seen by "Hospice" today. She reports they are coming to the home today to evaluate her for services. CSW had planned in home visit for today but per daughter request will hold on plans so that Hospice can assess and determine her options/eligibilty, etc.  CSW discussed the Medicaid application again as plans were for CSW to visit the home and assist patient with completing the application as complete as possible (without her husband's info) to facilitate the application being initiated and in hopes that DSS (Medicaid and/or APS) would be able to collect the rest of the info needed. [name] [name]  feels it is best to hold on this plan and CSW agrees- will await further needs/plans pending Hospice visit today.   Plan:  [facility] CM Care Plan Problem One     Most Recent Value  Care Plan Problem One  Patient hospitalized and in need of more support than home can provide.  Role Documenting the Problem One  Clinical Social Worker  Care Plan for Problem One  Active  [facility] Long Term Goal   Patient and family will arrange for adequate support/care in the home -vs_ LTC setting in the next 60 days.   THN Long Term Goal Start Date  11/10/16  Interventions for Problem One Long Term Goal  CSW aseessing and assisting with discussions related to Medicaid barriers and adequate care/support at home.   THN CM Short Term Goal #1    Patient's family will provide and assist with Medicaid application in the next 30 days.   THN CM Short Term Goal #1 Start Date  11/10/16  Interventions for Short Term Goal #1  CSW discussing needs, concerns and stressing for family to cooperate with info needed and to assist with Medicaid application.   THN CM Short Term Goal #2   Patient will have adequate in home care or be placed in LTC setting in the next 90 days.   THN CM Short Term Goal #2 Start Date  11/10/16  Interventions for Short Term Goal #2  CSW discussing needs, concerns and stressing for family to cooperate with info needed and to assist with Medicaid application.   THN CM Short Term Goal #3     THN CM Short Term Goal #3 Start Date  11/22/16  Interventions for Short Tern Goal #3     THN CM Short Term Goal #4     Interventions for Short Term Goal #4     THN CM Short Term Goal #5      Interventions for Short Term Goal #5       [facility] CM Care Plan Problem Two     Most Recent Value  Care Plan Problem Two          [name], MSW, [place] Worker  [place] [phone]

## 2016-11-28 ENCOUNTER — Other Ambulatory Visit: Payer: Self-pay | Admitting: *Deleted

## 2016-11-28 NOTE — Patient Outreach (Signed)
[facility]) Care Management  [facility] Social Work  11/28/2016  [name] [name] 23-Jun-1931 [id number]  Subjective:  "we met with hospice yesterday".  Objective: CSW to assist patient with commuity based resources to aide in her well-being, quality of life and overall safety/needs.    Current Medications:  Current Outpatient Prescriptions  Medication Sig Dispense Refill  . ACCU-CHEK SOFTCLIX LANCETS lancets CHECK BLOOD SUGARS TWICE DAILY (Patient taking differently: TID and PRN) 200 each 12  . acetaminophen (TYLENOL) 325 MG tablet Take 650 mg by mouth every 6 (six) hours as needed for mild pain.    [name] amLODipine (NORVASC) 5 MG tablet Take 5 mg by mouth daily.    [name] aspirin 81 MG tablet Take 81 mg by mouth daily.    . carvedilol (COREG) 6.25 MG tablet Take 6.25 mg by mouth 2 (two) times daily with a meal.    . furosemide (LASIX) 40 MG tablet Take 80 mg by mouth daily. Taking 80 mg daily    . hydroxypropyl methylcellulose / hypromellose (ISOPTO TEARS / GONIOVISC) 2.5 % ophthalmic solution Place 1 drop into both eyes as needed for dry eyes.    . insulin lispro (HUMALOG) 100 UNIT/ML injection Inject 3-10 Units into the skin 4 (four) times daily -  with meals and at bedtime. 100-200: 2 units; 201-250: 4 units; 251-300: 6 units; 301-350: 8 units; 351-400: 10 units; > 400: contact MD    . Insulin NPH Isophane & Regular (NOVOLIN 70/30 [place]) Inject 52 Units into the skin 2 (two) times daily with a meal. 52 units every morning  52 units every evening    . Insulin Pen Needle (RELION SHORT PEN NEEDLES) 31G X 8 MM MISC 1 each by Does not apply route 2 (two) times daily. 50 each 11  . megestrol (MEGACE) 400 MG/10ML suspension Take 400 mg by mouth 2 (two) times daily.    . polyethylene glycol (MIRALAX / GLYCOLAX) packet USE 1-2 PACKETS DAILY (Patient taking differently: use once daily PRN) 60 packet 0  . pravastatin (PRAVACHOL) 40 MG tablet TAKE 1 TABLET BY MOUTH AT BEDTIME 30 tablet 3   . ranitidine (ZANTAC) 150 MG tablet Take 150 mg by mouth 2 (two) times daily.     [name] senna (SENOKOT) 8.6 MG TABS tablet Take 2 tablets by mouth at bedtime.     No current facility-administered medications for this visit.     Functional Status:  In your present state of health, do you have any difficulty performing the following activities: 11/10/2016 10/10/2016  Hearing? N N  Vision? Y N  Difficulty concentrating or making decisions? Y Y  Comment - -  Walking or climbing stairs? Y Y  Dressing or bathing? [name]  Comment has family , regional consolidate services  -  Doing errands, shopping? Y -  Comment family assistance  -  Conservation officer, nature and eating ? Y -  Comment family assist -  Using the Toilet? Y -  In the past six months, have you accidently leaked urine? Y -  Comment - -  Do you have problems with loss of bowel control? N -  Managing your Medications? Y -  Managing your Finances? Y -  Comment family helps  -  Housekeeping or managing your Housekeeping? Y -  Comment community agency assist with family  -  Some recent data might be hidden    Fall/Depression Screening:  Fall Risk  11/10/2016 09/07/2016 06/21/2016  Falls in the past year? Yes Yes  Yes  Number falls in past yr: 2 or more 1 1  Injury with Fall? No No No  Risk Factor Category  High Fall Risk - -  Risk for fall due to : History of fall(s);Impaired balance/gait;Impaired mobility History of fall(s) Impaired balance/gait;Impaired mobility  Follow up Falls evaluation completed;Falls prevention discussed Falls evaluation completed;Falls prevention discussed Falls evaluation completed;Falls prevention discussed;Education provided   [facility] 2/9 Scores 11/10/2016 09/07/2016 06/21/2016 05/26/2016 04/05/2016 12/28/2015 11/22/2015  PHQ - 2 Score '1 1 1 1 1 ' 0 1    Assessment: CSW spoke with patient's daughter [name] [name] today by phone. She reports a hospice nurse came yesterday to assess and discuss their services. Per [name] [name], "hospice wont  provide physical therapy and so we have to decide what is best".  Daughter also shared that the HHPT came yesterday and "gave mama a hard time for being stubborn and not doing anything to improve".  CSW discussed with daughter the options at this time appear to continue with HHPT, etc for progress or to seek hospice involvement for more comfort/EOL type care and services. The patient and the family will need to further discuss and determine what the goals are. CSW spent lengthy time discussing options again with family; including continuing HHPT and other [place] services (as long as she is participating and progressing with PT) -vs- hospice care (per daughter their services were discussed by hospice nurse) -vs- long term placement in a facility. CSW again reiterated that if her mother is to go to a facility for long term care and/or hospice care, the payor piece is still a dilemma.   CSW uncertain which route they will decide as of now; as well as the uncertainty of how patient will progress or decline. CSW plans to discuss further with [facility] team and follow up with patient and family in the next week.    Plan: CSW will discuss plans with [facility] team and plan f/u call to patient and family in the next week.   [name], MSW, [place] Worker  [place] [phone]

## 2016-11-29 ENCOUNTER — Other Ambulatory Visit: Payer: Self-pay | Admitting: *Deleted

## 2016-11-29 DIAGNOSIS — L89622 Pressure ulcer of left heel, stage 2: Secondary | ICD-10-CM | POA: Diagnosis not present

## 2016-11-29 DIAGNOSIS — R29898 Other symptoms and signs involving the musculoskeletal system: Secondary | ICD-10-CM | POA: Diagnosis not present

## 2016-11-29 DIAGNOSIS — C541 Malignant neoplasm of endometrium: Secondary | ICD-10-CM | POA: Diagnosis not present

## 2016-11-29 DIAGNOSIS — L89612 Pressure ulcer of right heel, stage 2: Secondary | ICD-10-CM | POA: Diagnosis not present

## 2016-11-29 DIAGNOSIS — I5042 Chronic combined systolic (congestive) and diastolic (congestive) heart failure: Secondary | ICD-10-CM | POA: Diagnosis not present

## 2016-11-29 DIAGNOSIS — E1122 Type 2 diabetes mellitus with diabetic chronic kidney disease: Secondary | ICD-10-CM | POA: Diagnosis not present

## 2016-11-29 DIAGNOSIS — I110 Hypertensive heart disease with heart failure: Secondary | ICD-10-CM | POA: Diagnosis not present

## 2016-11-29 DIAGNOSIS — I69398 Other sequelae of cerebral infarction: Secondary | ICD-10-CM | POA: Diagnosis not present

## 2016-11-29 DIAGNOSIS — N183 Chronic kidney disease, stage 3 (moderate): Secondary | ICD-10-CM | POA: Diagnosis not present

## 2016-11-29 NOTE — Patient Outreach (Signed)
[facility]) Care Management  11/29/2016  [name] [name] 11-16-1931 [id number]   Transition of care call   Placed call to patient,  Person answering phone identified as her sister in law [name] answered telephone and reports patient is eating at this time and request a return call later.  Caregiver reports blood sugar 290 this morning and 2 previous mornings readings less 200.   1430  Returned call to patient, patient remarks "I'm making it". Patient states I think I'm going to lose y'all patient unable to explain what she means.  Patient discussed being weaker and using the bedside commode instead of going into the bathroom. Patient states family still available to assist with medications daily and helping patient with getting into the bed.  Patient discussed she thinks she has a doctor appointment on next week about her cancer. Agreeable to me calling her daughter, [name] .    Placed call to patient daughter [name], discussed patient is becoming more forgetful, weaker, not able to stand hardly to change her diaper. Patient requires 2 person assist for movement out of bed and when she uses bedside commode .  [name] discussed patient has appointment with Oncology radiation for consult on next week. Patient wants to more information on radiation. Daughter discussed patient is weaker now and unsure if she will be able to stand up to therapy. Daughter discussed [facility] with [facility] Monday and states representative [name] was following up with PCP office for orders. Daughter discussed services patient would receive through Hospice.  1600 Placed call to [facility] to follow up on referral spoke with [name] reports they have placed call to MD office regarding referral  and awaiting return call.  [place] call to [name] at [facility] and Hospice regarding visit, she states they received referral from [facility] home health  regarding hospice service.  [name] states she has spoken with [name] patient daughter this afternoon to inform her that she has received written order from PCP office for Hospice on today, [name] states [facility] anticipated discharge patient from home health service on today. [name] states Hospice services could begin on tomorrow, she will follow up with CM on next day.   Plan Will await return call from [facility] care and hospice, regarding beginning of services. Will follow up with patient/daughter,if patient admitted to Hospice services they will managing her care, Will update [name], LCSW   [name], RN, [place] Management Coordinator  [phone]- Mobile [phone]- [place]

## 2016-11-30 ENCOUNTER — Other Ambulatory Visit: Payer: Self-pay | Admitting: *Deleted

## 2016-11-30 NOTE — Patient Outreach (Addendum)
[place] [facility]) Care Management  11/30/2016  NASHA DISS 1931-04-11 [id number]  Care Coordination  Incoming call from [place] at [facility] care and Hospice, she reports that [facility] home health discharged patient on yesterday as plan was for patient to transition of Hospice. [name] reports she has everything that she needs, orders from MD  to accept patient, she has placed call to patient daughter [name]. At this time patient is not scheduled for admission to hospice until she communicates  with daughter on today.    Plan Will await return call from [name] at [facility] and Hospice, prior to follow up with patient/daughter.  [place] call to [name] at [facility] care and Hospice she reports she has spoken with patient son and daughter and anticipates patient enrollment in Hospice program on 10/26.  Will update [facility] team members of new plan. Will follow up with patient/daughter in next week for case closure in the next week as patient care will be managed by [facility] and Hospice.   [name], RN, [place] Management Coordinator  [phone]- Mobile [phone]- Toll Free Main Office

## 2016-12-01 ENCOUNTER — Other Ambulatory Visit: Payer: Self-pay | Admitting: *Deleted

## 2016-12-01 NOTE — Patient Outreach (Signed)
[place] [facility]) Care Management  12/01/2016  [name] [name] 1931/12/08 [id number]  Telephone follow up  Incoming call from [name] [name], daughter of patient she had questions regarding patient eligibility for Hospice services If patient decided to seek, consult about radiation therapy treatment for cancer. Daughter also has questions related to what personal care services patient would have with hospice, questions if she would be able to keep bath aide services with Regional consolidated services.  Discussed with daughter, [name] [name] from [facility] would be able to answer her questions.   Plan Will follow up with [name] [name] at [facility], spoke with [name] [name] states she has spoken with [name] [name], and anticipate admission to Hospice program on 10/27 with nurse visit.    [name], RN, [place] Management Coordinator  [phone]- Mobile [phone]- Toll Free Main Office

## 2016-12-05 ENCOUNTER — Other Ambulatory Visit: Payer: Self-pay | Admitting: *Deleted

## 2016-12-05 ENCOUNTER — Encounter: Payer: Self-pay | Admitting: *Deleted

## 2016-12-05 ENCOUNTER — Ambulatory Visit: Payer: Self-pay | Admitting: *Deleted

## 2016-12-05 NOTE — Patient Outreach (Signed)
[place] [facility]) Care Management  Jan 04, 2017  [name] [name] 1931/09/20 [id number]  Case Closure  Late Entry for 04-Jan-2023 Received voice mail message from [place] , reporting patient had passed on 2024-11-282024/11/29 Spoke with  patient daughter, [name] [name] ., condolences offered.  She expressed thanks to team effort.   Plan  Will close case, per workflow, will notify [facility] care team members.   [name], RN, [place] Management Coordinator  [phone]- Mobile [phone]- Toll Free Main Office

## 2016-12-06 ENCOUNTER — Other Ambulatory Visit: Payer: Self-pay | Admitting: *Deleted

## 2016-12-06 ENCOUNTER — Encounter: Payer: Self-pay | Admitting: *Deleted

## 2016-12-06 NOTE — Patient Outreach (Signed)
[facility]) Care Management  12/06/2016  [name] [name] 11-Feb-1931 [id number]   CSW spoke to patient's daughter, [name], by phone today to express condolences after hearing the news of patients death.  [name] shared with CSW about the visit over the weekend with the Hospice Nurse and her mother's decision to not enroll with Hospice as she wanted to continue her treatments. CSW offered support and sympathy- [name] voiced appreciation for all [facility] team) has done.    [facility] CM Care Plan Problem One     Most Recent Value  Care Plan Problem One  Patient hospitalized and in need of more support than home can provide.  Role Documenting the Problem One  Clinical Social Worker  Care Plan for Problem One  Active  [facility] Long Term Goal   Patient and family will arrange for adequate support/care in the home -vs_ LTC setting in the next 60 days.   [facility]N Long Term Goal Start Date  11/10/16  [facility] Long Term Goal Met Date  12/06/16  Interventions for Problem One Long Term Goal  CSW aseessing and assisting with discussions related to Medicaid barriers and adequate care/support at home.   [facility] CM Short Term Goal #1    Patient's family will provide and assist with Medicaid application in the next 30 days.   THN CM Short Term Goal #1 Start Date  11/10/16  [facility] CM Short Term Goal #1 Met Date  12/06/16  Interventions for Short Term Goal #1  CSW discussing needs, concerns and stressing for family to cooperate with info needed and to assist with Medicaid application.   THN CM Short Term Goal #2   Patient will have adequate in home care or be placed in LTC setting in the next 90 days.   THN CM Short Term Goal #2 Start Date  11/10/16  [facility] CM Short Term Goal #2 Met Date  11/10/16  Interventions for Short Term Goal #2  CSW discussing needs, concerns and stressing for family to cooperate with info needed and to assist with Medicaid application.   THN CM Short Term Goal #3     Interventions for Short Tern  Goal #3     THN CM Short Term Goal #4     Interventions for Short Term Goal #4     THN CM Short Term Goal #5      Interventions for Short Term Goal #5       [facility] CM Care Plan Problem Two     Most Recent Value  Care Plan Problem Two         [name], MSW, [place] Worker  [place] [phone]

## 2016-12-07 DEATH — deceased

## 2016-12-11 ENCOUNTER — Ambulatory Visit: Payer: Self-pay | Admitting: Pharmacist

## 2018-08-30 ENCOUNTER — Encounter (INDEPENDENT_AMBULATORY_CARE_PROVIDER_SITE_OTHER): Payer: Medicare Other

## 2018-08-30 ENCOUNTER — Ambulatory Visit (INDEPENDENT_AMBULATORY_CARE_PROVIDER_SITE_OTHER): Payer: Medicare Other | Admitting: Vascular Surgery
